# Patient Record
Sex: Male | Born: 1960 | Race: White | Hispanic: No | Marital: Single | Smoking: Current every day smoker
Health system: Southern US, Community
[De-identification: ages and names within clinical notes are randomized; demographics above are authoritative.]

## PROBLEM LIST (undated history)

## (undated) ENCOUNTER — Emergency Department (HOSPITAL_COMMUNITY): Discharge status: Absent without leave | Payer: BLUE CROSS/BLUE SHIELD

## (undated) DIAGNOSIS — M1990 Unspecified osteoarthritis, unspecified site: Secondary | ICD-10-CM

## (undated) DIAGNOSIS — S42309A Unspecified fracture of shaft of humerus, unspecified arm, initial encounter for closed fracture: Secondary | ICD-10-CM

## (undated) DIAGNOSIS — F32A Depression, unspecified: Secondary | ICD-10-CM

## (undated) DIAGNOSIS — R519 Headache, unspecified: Secondary | ICD-10-CM

## (undated) DIAGNOSIS — F909 Attention-deficit hyperactivity disorder, unspecified type: Secondary | ICD-10-CM

## (undated) DIAGNOSIS — K4090 Unilateral inguinal hernia, without obstruction or gangrene, not specified as recurrent: Secondary | ICD-10-CM

## (undated) DIAGNOSIS — E7800 Pure hypercholesterolemia, unspecified: Secondary | ICD-10-CM

## (undated) DIAGNOSIS — I10 Essential (primary) hypertension: Secondary | ICD-10-CM

## (undated) DIAGNOSIS — G709 Myoneural disorder, unspecified: Secondary | ICD-10-CM

## (undated) HISTORY — PX: HERNIA REPAIR: SHX51

---

## 2001-04-09 ENCOUNTER — Emergency Department (HOSPITAL_COMMUNITY): Admission: EM | Admit: 2001-04-09 | Discharge: 2001-04-09 | Payer: Self-pay | Admitting: Family Medicine

## 2003-10-14 ENCOUNTER — Emergency Department (HOSPITAL_COMMUNITY): Admission: EM | Admit: 2003-10-14 | Discharge: 2003-10-14 | Payer: Self-pay | Admitting: Emergency Medicine

## 2003-10-18 ENCOUNTER — Ambulatory Visit (HOSPITAL_BASED_OUTPATIENT_CLINIC_OR_DEPARTMENT_OTHER): Admission: RE | Admit: 2003-10-18 | Discharge: 2003-10-18 | Payer: Self-pay | Admitting: Orthopedic Surgery

## 2003-10-18 ENCOUNTER — Ambulatory Visit (HOSPITAL_COMMUNITY): Admission: RE | Admit: 2003-10-18 | Discharge: 2003-10-18 | Payer: Self-pay | Admitting: Orthopedic Surgery

## 2006-03-16 ENCOUNTER — Emergency Department: Payer: Self-pay | Admitting: Emergency Medicine

## 2006-11-08 ENCOUNTER — Emergency Department: Payer: Self-pay | Admitting: Emergency Medicine

## 2006-11-10 ENCOUNTER — Emergency Department: Payer: Self-pay

## 2006-12-13 HISTORY — PX: HERNIA REPAIR: SHX51

## 2007-05-29 ENCOUNTER — Emergency Department (HOSPITAL_COMMUNITY): Admission: EM | Admit: 2007-05-29 | Discharge: 2007-05-29 | Payer: Self-pay | Admitting: Emergency Medicine

## 2007-10-16 ENCOUNTER — Emergency Department (HOSPITAL_COMMUNITY): Admission: EM | Admit: 2007-10-16 | Discharge: 2007-10-17 | Payer: Self-pay | Admitting: Emergency Medicine

## 2008-04-20 ENCOUNTER — Emergency Department (HOSPITAL_COMMUNITY): Admission: EM | Admit: 2008-04-20 | Discharge: 2008-04-20 | Payer: Self-pay | Admitting: Emergency Medicine

## 2008-04-24 ENCOUNTER — Emergency Department (HOSPITAL_COMMUNITY): Admission: EM | Admit: 2008-04-24 | Discharge: 2008-04-24 | Payer: Self-pay | Admitting: Family Medicine

## 2008-07-17 ENCOUNTER — Emergency Department (HOSPITAL_COMMUNITY): Admission: EM | Admit: 2008-07-17 | Discharge: 2008-07-17 | Payer: Self-pay | Admitting: Emergency Medicine

## 2008-07-26 ENCOUNTER — Emergency Department (HOSPITAL_COMMUNITY): Admission: EM | Admit: 2008-07-26 | Discharge: 2008-07-26 | Payer: Self-pay | Admitting: Emergency Medicine

## 2008-08-09 ENCOUNTER — Ambulatory Visit (HOSPITAL_COMMUNITY): Admission: RE | Admit: 2008-08-09 | Discharge: 2008-08-09 | Payer: Self-pay | Admitting: Surgery

## 2009-04-28 ENCOUNTER — Emergency Department (HOSPITAL_COMMUNITY): Admission: EM | Admit: 2009-04-28 | Discharge: 2009-04-28 | Payer: Self-pay | Admitting: Emergency Medicine

## 2011-04-27 NOTE — Op Note (Signed)
NAME:  Justin Landry, Justin Landry           ACCOUNT NO.:  [id number]   MEDICAL RECORD NO.:  [id number]          PATIENT TYPE:  AMB   LOCATION:  DAY                          FACILITY:  [facility]   PHYSICIAN:  Sandria Bales. Ezzard Standing, M.D.  DATE OF BIRTH:  February 21, 1961   DATE OF PROCEDURE:  08/09/2008  DATE OF DISCHARGE:                               OPERATIVE REPORT   Date of surgery ??   PREOPERATIVE DIAGNOSIS:  Right inguinal hernia.   POSTOPERATIVE DIAGNOSIS:  A large direct right inguinal hernia.   PROCEDURE:  Open right inguinal herniorrhaphy with mesh repair.   SURGEON:  Sandria Bales. Ezzard Standing, M.D.   ANESTHESIA:  General LMA with 30 mL of  0.25% Marcaine.   PROCEDURE:  Mr. Leibold is a 50 year old white male who is a patient of  Dr. Vear Clock of [facility] who has noticed a right inguinal hernia for  several months.  However, he recently had enough pain that he went to  [facility], where they had to reduce the hernia there,  then sent to our office for evaluation.   I discussed with him the options for hernia repair, both open and  laparoscopic.  I think he would be a good candidate for open inguinal  hernia repair.  I discussed the potential complications including  bleeding, infection, nerve injury, and recurrence of the hernia.   OPERATIVE NOTE:  The patient was placed in the supine position, given a  general LMA anesthesia.  His loin was prepped with Betadine solution and  sterilely draped.   A time-out was held, identifying the patient and procedure.  He was  given 1 gram of Ancef at the time of the procedure.   A right inguinal incision was made with sharp dissection and carried  down to the external oblique fascia.  The external oblique fascia was  opened, the cord structures identified.  The ilioinguinal nerve was  identified.  I tried to move this out of the way of the dissection.  I  stripped away a fairly large direct inguinal hernia sac.  The inguinal  floor defect  was noted to be 2.5-3 cm.  The sac protruded probably 12+  centimeters.  I was able to reduce the  sac back into the inguinal floor  and then imbricate the sac with a 0 chromic suture.   I then tried to dissect out along the internal ring.  I found no  evidence of an indirect inguinal hernia.  I carried out a floor repair  using a piece of Bard mesh.  The mesh was 3 x 6 inches.  It was cut down  to about 2-1/2 x 4 inches.  A keyhole was made for the internal ring.  The mesh was sewn medially to the pubic tubercle, inferiorly to the  shelving edge of the inguinal ligament, superiorly to the transversalis  fascia.  The internal ring was recreated through the keyhole and mesh  closed behind the keyhole.  I used interrupted 0 Novofil sutures to sew  the mesh in place.   At this time I irrigated the wound.  I then infiltrated the  fascia with  0.25% Marcaine in the deep fascial spaces and the inguinal floor.  I  then closed the external oblique fascia with interrupted 3-0 Vicryl  sutures.  I infiltrated the subfascial spaces with 0.25% Marcaine and  then in the more superficial I infiltrated that with 0.25% Marcaine.   The patient tolerated the procedure well.  I then closed the skin with 3-  0 Vicryl subcutaneous stitches and 5-0 Monocryl suture, painted with  tincture of benzoin, and Steri-Strips.  The patient tolerated the  procedure well, was transported to the recovery room in good condition.  The sponge and needle count were correct at the end of the case.   He will be discharged home today and will see him back in 2-3 weeks for  followup.      Sandria Bales. Ezzard Standing, M.D.  Electronically Signed     DHN/MEDQ  D:  08/09/2008  T:  08/09/2008  Job:  [id number]   cc:   Loma Sender  Fax: [phone]

## 2011-04-30 NOTE — Op Note (Signed)
NAME:  Justin Landry, Justin Landry                     ACCOUNT NO.:  [id number]   MEDICAL RECORD NO.:  [id number]                   PATIENT TYPE:  AMB   LOCATION:  [facility]                                 FACILITY:  [facility]   PHYSICIAN:  Katy Fitch. Naaman Plummer., M.D.          DATE OF BIRTH:  Jul 07, 1961   DATE OF PROCEDURE:  10/18/2003  DATE OF DISCHARGE:                                 OPERATIVE REPORT   PREOPERATIVE DIAGNOSIS:  Unstable transverse metaphyseal fracture of left  thumb distal  phalanx, status post  crushing injury.   POSTOPERATIVE DIAGNOSIS:  Unstable transverse metaphyseal fracture of left  thumb distal  phalanx, status post  crushing injury.   OPERATION:  Closed reduction and percutaneous Kirschner wire fixation left  thumb distal  phalanx fracture x2.   SURGEON:  Katy Fitch. Sypher, M.D.   ASSISTANT:  Jonni Sanger, P.A.-C.   ANESTHESIA:  General by LMA, supervising anesthesiologist Janetta Hora.  Gelene Mink, M.D.   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 He had  acute pain and  swelling. He was brought to the [facility] emergency room where  his wounds were cleaned and sutures. X-rays revealed an unstable proximal  metaphyseal fracture of his left thumb distal phalanx. A hand surgery  consult was requested.   On examination  he was noted to have an unstable fracture with apex dorsal  angulation. We recommended closed reduction and percutaneous Kirschner wire  fixation. He is brought to the operating room at this time anticipating  closed reduction and percutaneous Kirschner wire fixation of his distal  phalanx fracture.   DESCRIPTION OF PROCEDURE:  Justin Landry is brought to the operating  room and placed in the supine position on the operating table. Following  induction of general anesthesia by LMA,  the left arm was prepped with  Betadine soap and solution and sterilely draped.   When the anesthesia was satisfactory, the fracture was reduced by 3 point  manipulation. Two 0.045 inch Kirschner wires were placed obliquely to avoid  the IP joint. Anatomic reduction and good fixation was achieved. There were  no apparent complications. The wounds were dressed with Xeroflow sterile  gauze and an Alumafoam splint.   For aftercare [name] was given a prescription for Percocet 1 to  2  tablets q.4-6h. p.r.n. pain, 20 tablets without refill. He is also given  Keflex 500 mg 1 p.o. q.8h. x4 days as a prophylactic antibiotic. He will  return to our office for follow up in one week for wound check and suture  removal. He anticipates pin placement  for 3 to 4 weeks.  Katy Fitch Naaman Plummer., M.D.   RVS/MEDQ  D:  10/18/2003  T:  10/19/2003  Job:  [id number]

## 2011-05-13 ENCOUNTER — Ambulatory Visit (HOSPITAL_COMMUNITY)
Admission: RE | Admit: 2011-05-13 | Discharge: 2011-05-13 | Disposition: A | Discharge status: Home / Self Care | Payer: Self-pay | Attending: Psychiatry | Admitting: Psychiatry

## 2011-05-13 DIAGNOSIS — F4310 Post-traumatic stress disorder, unspecified: Secondary | ICD-10-CM | POA: Insufficient documentation

## 2011-07-25 ENCOUNTER — Inpatient Hospital Stay (INDEPENDENT_AMBULATORY_CARE_PROVIDER_SITE_OTHER)
Admission: RE | Admit: 2011-07-25 | Discharge: 2011-07-25 | Disposition: A | Discharge status: Home / Self Care | Payer: Self-pay | Source: Ambulatory Visit | Attending: Family Medicine | Admitting: Family Medicine

## 2011-07-25 DIAGNOSIS — R109 Unspecified abdominal pain: Secondary | ICD-10-CM

## 2011-07-25 DIAGNOSIS — I10 Essential (primary) hypertension: Secondary | ICD-10-CM

## 2011-07-28 ENCOUNTER — Inpatient Hospital Stay (INDEPENDENT_AMBULATORY_CARE_PROVIDER_SITE_OTHER)
Admission: RE | Admit: 2011-07-28 | Discharge: 2011-07-28 | Disposition: A | Discharge status: Home / Self Care | Payer: Self-pay | Source: Ambulatory Visit | Attending: Family Medicine | Admitting: Family Medicine

## 2011-07-28 DIAGNOSIS — R109 Unspecified abdominal pain: Secondary | ICD-10-CM

## 2011-09-10 LAB — URINALYSIS, ROUTINE W REFLEX MICROSCOPIC
Bilirubin Urine: NEGATIVE
Glucose, UA: NEGATIVE
Hgb urine dipstick: NEGATIVE
Ketones, ur: NEGATIVE
Nitrite: NEGATIVE
Protein, ur: NEGATIVE
Specific Gravity, Urine: 1.022
Urobilinogen, UA: 1
pH: 5.5

## 2011-09-21 LAB — POCT I-STAT CREATININE
Creatinine, Ser: 0.9
Operator id: 257131

## 2011-09-21 LAB — DIFFERENTIAL
Basophils Absolute: 0
Basophils Relative: 0
Eosinophils Absolute: 0.1
Eosinophils Relative: 1
Lymphocytes Relative: 6 — ABNORMAL LOW
Lymphs Abs: 1.3
Monocytes Absolute: 1.6 — ABNORMAL HIGH
Monocytes Relative: 7
Neutro Abs: 20.7 — ABNORMAL HIGH
Neutrophils Relative %: 87 — ABNORMAL HIGH

## 2011-09-21 LAB — I-STAT 8, (EC8 V) (CONVERTED LAB)
BUN: 16
Bicarbonate: 22.7
Chloride: 102
Glucose, Bld: 145 — ABNORMAL HIGH
HCT: 52
Hemoglobin: 17.7 — ABNORMAL HIGH
Operator id: 257131
Potassium: 4
Sodium: 133 — ABNORMAL LOW
TCO2: 24
pCO2, Ven: 32.7 — ABNORMAL LOW
pH, Ven: 7.449 — ABNORMAL HIGH

## 2011-09-21 LAB — CBC
HCT: 47.4
Hemoglobin: 16.5
MCHC: 34.7
MCV: 90.8
Platelets: 292
RBC: 5.22
RDW: 13
WBC: 23.8 — ABNORMAL HIGH

## 2011-09-21 LAB — RAPID STREP SCREEN (MED CTR MEBANE ONLY): Streptococcus, Group A Screen (Direct): POSITIVE — AB

## 2011-12-08 ENCOUNTER — Emergency Department (INDEPENDENT_AMBULATORY_CARE_PROVIDER_SITE_OTHER)
Admission: EM | Admit: 2011-12-08 | Discharge: 2011-12-08 | Disposition: A | Discharge status: Home / Self Care | Payer: Self-pay | Source: Home / Self Care

## 2011-12-08 ENCOUNTER — Encounter: Payer: Self-pay | Admitting: Emergency Medicine

## 2011-12-08 DIAGNOSIS — Z91199 Patient's noncompliance with other medical treatment and regimen due to unspecified reason: Secondary | ICD-10-CM

## 2011-12-08 DIAGNOSIS — Z9114 Patient's other noncompliance with medication regimen: Secondary | ICD-10-CM

## 2011-12-08 DIAGNOSIS — I10 Essential (primary) hypertension: Secondary | ICD-10-CM

## 2011-12-08 DIAGNOSIS — Z9119 Patient's noncompliance with other medical treatment and regimen: Secondary | ICD-10-CM

## 2011-12-08 DIAGNOSIS — J209 Acute bronchitis, unspecified: Secondary | ICD-10-CM

## 2011-12-08 DIAGNOSIS — J069 Acute upper respiratory infection, unspecified: Secondary | ICD-10-CM

## 2011-12-08 HISTORY — DX: Essential (primary) hypertension: I10

## 2011-12-08 MED ORDER — ALBUTEROL SULFATE HFA 108 (90 BASE) MCG/ACT IN AERS
2.0000 | INHALATION_SPRAY | RESPIRATORY_TRACT | Status: DC | PRN
  Administered 2011-12-08 (×2): 2 via RESPIRATORY_TRACT

## 2011-12-08 MED ORDER — ALBUTEROL SULFATE HFA 108 (90 BASE) MCG/ACT IN AERS
INHALATION_SPRAY | RESPIRATORY_TRACT | Status: AC
  Filled 2011-12-08: qty 6.7

## 2011-12-08 MED ORDER — IPRATROPIUM BROMIDE 0.02 % IN SOLN
0.5000 mg | Freq: Once | RESPIRATORY_TRACT | Status: AC
  Administered 2011-12-08: 0.5 mg via RESPIRATORY_TRACT

## 2011-12-08 MED ORDER — PREDNISONE 20 MG PO TABS: 20.0000 mg | ORAL_TABLET | Freq: Two times a day (BID) | ORAL | Status: AC

## 2011-12-08 MED ORDER — ALBUTEROL SULFATE (5 MG/ML) 0.5% IN NEBU
INHALATION_SOLUTION | RESPIRATORY_TRACT | Status: AC
  Filled 2011-12-08: qty 1

## 2011-12-08 MED ORDER — ALBUTEROL SULFATE (5 MG/ML) 0.5% IN NEBU
5.0000 mg | INHALATION_SOLUTION | Freq: Once | RESPIRATORY_TRACT | Status: AC
  Administered 2011-12-08: 5 mg via RESPIRATORY_TRACT

## 2011-12-08 NOTE — ED Provider Notes (Signed)
Medical screening examination/treatment/procedure(s) were performed by non-physician practitioner and as supervising physician I was immediately available for consultation/collaboration.  LANEY,RONNIE   Ronnie Laney, MD 12/08/11 1843 

## 2011-12-08 NOTE — ED Provider Notes (Signed)
History     CSN: [id number]  Arrival date & time 12/08/11  1024   None     Chief Complaint  Patient presents with  . Sinusitis  . URI    (Consider location/radiation/quality/duration/timing/severity/associated sxs/prior treatment) HPI Comments: Pt c/o eyes watering, nasal congestion and cough x 4 days. Cough is sometimes productive with light green sputum. He has had intermittent wheezing, but denies dyspnea.  He is a smoker. Denies hx of COPD or Asthma. He has been taking Alka Seltzer Plus Cold and Benadryl for his symptoms and is not improving. He also states he ran out of his blood pressure medication one week ago. He has not notified his PCP yet because of the holidays, assuming that it would be difficult to get through to the office. Pt is uncertain which BP medication he is taking or the strength - but may be Benazepril as it sounds familiar to him. His PCP is in [place].    Past Medical History  Diagnosis Date  . Hypertension     Past Surgical History  Procedure Date  . Hernia repair     No family history on file.  History  Substance Use Topics  . Smoking status: Current Everyday Smoker  . Smokeless tobacco: Not on file  . Alcohol Use: Yes      Review of Systems  Constitutional: Negative for fever and chills.  HENT: Positive for congestion, postnasal drip and sinus pressure. Negative for ear pain and sore throat.   Eyes: Negative for redness.  Respiratory: Positive for cough and wheezing. Negative for shortness of breath.   Cardiovascular: Negative for chest pain, palpitations and leg swelling.  Gastrointestinal: Negative for nausea and vomiting.    Allergies  Review of patient's allergies indicates no known allergies.  Home Medications   Current Outpatient Rx  Name Route Sig Dispense Refill  . PRESCRIPTION MEDICATION  Pt is taking bp medication but unsure of strength.last taken wednesday     . PREDNISONE 20 MG PO TABS Oral Take 1 tablet (20 mg  total) by mouth 2 (two) times daily. 10 tablet 0    BP 183/118  Pulse 91  Temp(Src) 97.6 F (36.4 C) (Oral)  Resp 19  SpO2 95%  Physical Exam  Nursing note and vitals reviewed. Constitutional: He appears well-developed and well-nourished. No distress.  HENT:  Head: Normocephalic and atraumatic.  Right Ear: Tympanic membrane, external ear and ear canal normal.  Left Ear: Tympanic membrane, external ear and ear canal normal.  Nose: Mucosal edema (and erythema) present. No rhinorrhea, nasal deformity or septal deviation. No epistaxis.  Mouth/Throat: Uvula is midline, oropharynx is clear and moist and mucous membranes are normal. No oropharyngeal exudate, posterior oropharyngeal edema or posterior oropharyngeal erythema.  Neck: Neck supple.  Cardiovascular: Normal rate, regular rhythm and normal heart sounds.   Pulmonary/Chest: Effort normal. No respiratory distress. He has no decreased breath sounds. He has wheezes (inspiratory) in the right upper field and the left upper field. He has no rhonchi. He has no rales.  Lymphadenopathy:    He has no cervical adenopathy.  Neurological: He is alert.  Skin: Skin is warm and dry.  Psychiatric: He has a normal mood and affect.    ED Course  Procedures (including critical care time)  Labs Reviewed - No data to display No results found.   1. Acute bronchitis   2. Acute URI   3. Hypertension   4. Noncompliance with medications       MDM  Pt reports symptomatic improvement after NMT, Lungs CTA, and pulse improved to 98% RA.  Upon review of previous visits pt has a hx of HTN medication noncomplaince . Encouraged pt to f/u with PCP for HTN mgmt.        Melody Comas, [place] 12/08/11 1438

## 2011-12-08 NOTE — ED Notes (Signed)
PT HERE WITH SINUS SX/COLD URI THAT STARTED Sunday WITH NASAL CONGESTION WITH GREENISH DRAINAGE AND NOW OCASS COUGH,NAUSEA AND SINUS PRESSURE AND H/A.BP ELEVATED ALSO.PT LAST TOOK BP MED LAST [facility]

## 2011-12-20 ENCOUNTER — Other Ambulatory Visit: Payer: Self-pay

## 2011-12-20 ENCOUNTER — Emergency Department (HOSPITAL_COMMUNITY)
Admission: EM | Admit: 2011-12-20 | Discharge: 2011-12-20 | Disposition: A | Discharge status: Home / Self Care | Payer: Self-pay | Attending: Emergency Medicine | Admitting: Emergency Medicine

## 2011-12-20 ENCOUNTER — Encounter (HOSPITAL_COMMUNITY): Payer: Self-pay

## 2011-12-20 DIAGNOSIS — N476 Balanoposthitis: Secondary | ICD-10-CM | POA: Insufficient documentation

## 2011-12-20 DIAGNOSIS — I10 Essential (primary) hypertension: Secondary | ICD-10-CM | POA: Insufficient documentation

## 2011-12-20 DIAGNOSIS — E119 Type 2 diabetes mellitus without complications: Secondary | ICD-10-CM | POA: Insufficient documentation

## 2011-12-20 DIAGNOSIS — Z79899 Other long term (current) drug therapy: Secondary | ICD-10-CM | POA: Insufficient documentation

## 2011-12-20 LAB — URINALYSIS, ROUTINE W REFLEX MICROSCOPIC
Bilirubin Urine: NEGATIVE
Glucose, UA: >1000 mg/dL — AB
Hgb urine dipstick: NEGATIVE
Ketones, ur: NEGATIVE mg/dL
Leukocytes, UA: NEGATIVE
Nitrite: NEGATIVE
Protein, ur: NEGATIVE mg/dL
Specific Gravity, Urine: 1.034 — ABNORMAL HIGH (ref 1.005–1.030)
Urobilinogen, UA: 0.2 mg/dL (ref 0.0–1.0)
pH: 6 (ref 5.0–8.0)

## 2011-12-20 LAB — CBC
HCT: 49.2 % (ref 39.0–52.0)
Hemoglobin: 18.1 g/dL — ABNORMAL HIGH (ref 13.0–17.0)
RBC: 5.7 MIL/uL (ref 4.22–5.81)
WBC: 11.7 10*3/uL — ABNORMAL HIGH (ref 4.0–10.5)

## 2011-12-20 LAB — COMPREHENSIVE METABOLIC PANEL
ALT: 58 U/L — ABNORMAL HIGH (ref 0–53)
Alkaline Phosphatase: 77 U/L (ref 39–117)
BUN: 15 mg/dL (ref 6–23)
CO2: 24 mEq/L (ref 19–32)
Chloride: 94 mEq/L — ABNORMAL LOW (ref 96–112)
GFR calc Af Amer: >90 mL/min (ref >90)
Glucose, Bld: 373 mg/dL — ABNORMAL HIGH (ref 70–99)
Potassium: 4.4 mEq/L (ref 3.5–5.1)
Total Bilirubin: 0.4 mg/dL (ref 0.3–1.2)

## 2011-12-20 LAB — GLUCOSE, CAPILLARY
Glucose-Capillary: 359 mg/dL — ABNORMAL HIGH (ref 70–99)
Glucose-Capillary: 294 mg/dL — ABNORMAL HIGH (ref 70–99)
Glucose-Capillary: 218 mg/dL — ABNORMAL HIGH (ref 70–99)

## 2011-12-20 LAB — PROTIME-INR: Prothrombin Time: 12.3 seconds (ref 11.6–15.2)

## 2011-12-20 MED ORDER — SODIUM CHLORIDE 0.9 % IV BOLUS (SEPSIS)
1000.0000 mL | Freq: Once | INTRAVENOUS | Status: AC
  Administered 2011-12-20: 1000 mL via INTRAVENOUS

## 2011-12-20 MED ORDER — METFORMIN HCL 500 MG PO TABS: 500.0000 mg | ORAL_TABLET | Freq: Two times a day (BID) | ORAL | Status: DC

## 2011-12-20 MED ORDER — OXYCODONE-ACETAMINOPHEN 5-325 MG PO TABS: 1.0000 | ORAL_TABLET | ORAL | Status: AC | PRN

## 2011-12-20 MED ORDER — CLOTRIMAZOLE 1 % EX CREA: TOPICAL_CREAM | CUTANEOUS | Status: DC

## 2011-12-20 MED ORDER — PHENAZOPYRIDINE HCL 100 MG PO TABS
100.0000 mg | ORAL_TABLET | Freq: Once | ORAL | Status: AC
  Administered 2011-12-20: 100 mg via ORAL
  Filled 2011-12-20: qty 1

## 2011-12-20 MED ORDER — INSULIN ASPART 100 UNIT/ML ~~LOC~~ SOLN
10.0000 [IU] | Freq: Once | SUBCUTANEOUS | Status: AC
  Administered 2011-12-20: 10 [IU] via INTRAVENOUS
  Filled 2011-12-20: qty 1

## 2011-12-20 MED ORDER — TRAMADOL HCL 50 MG PO TABS
50.0000 mg | ORAL_TABLET | Freq: Once | ORAL | Status: AC
  Administered 2011-12-20: 50 mg via ORAL
  Filled 2011-12-20: qty 1

## 2011-12-20 MED ORDER — TRAMADOL HCL 50 MG PO TABS: 50.0000 mg | ORAL_TABLET | Freq: Once | ORAL | Status: DC

## 2011-12-20 MED ORDER — PHENAZOPYRIDINE HCL 200 MG PO TABS: 200.0000 mg | ORAL_TABLET | Freq: Three times a day (TID) | ORAL | Status: AC

## 2011-12-20 NOTE — ED Provider Notes (Signed)
History   Pt p/w 1 week of penile irritation and pain. Mild bleeding around irritated skin. No penile d/c. No urinary obstruction though pt has difficulty retracting foreskin. No systemic symptoms.    CSN: [id number]  Arrival date & time 12/20/11  1234   First MD Initiated Contact with Patient 12/20/11 1313      Chief Complaint  Patient presents with  . Hematuria    (Consider location/radiation/quality/duration/timing/severity/associated sxs/prior treatment) HPI  Past Medical History  Diagnosis Date  . Hypertension     Past Surgical History  Procedure Date  . Hernia repair     History reviewed. No pertinent family history.  History  Substance Use Topics  . Smoking status: Current Everyday Smoker  . Smokeless tobacco: Not on file  . Alcohol Use: Yes      Review of Systems  Constitutional: Negative for fever and chills.  Respiratory: Negative for cough and shortness of breath.   Cardiovascular: Negative for chest pain.  Genitourinary: Positive for dysuria, penile swelling, genital sores and penile pain. Negative for flank pain, decreased urine volume, discharge, scrotal swelling, difficulty urinating and testicular pain.    Allergies  Oxycodone and Penicillins  Home Medications   Current Outpatient Rx  Name Route Sig Dispense Refill  . ALBUTEROL SULFATE HFA 108 (90 BASE) MCG/ACT IN AERS Inhalation Inhale 2 puffs into the lungs every 6 (six) hours as needed. For shortness of breath     . LISINOPRIL-HYDROCHLOROTHIAZIDE 20-25 MG PO TABS Oral Take 1 tablet by mouth daily.      . ADULT MULTIVITAMIN W/MINERALS CH Oral Take 1 tablet by mouth daily.      Marland Kitchen BACITRACIN-NEOMYCIN-POLYMYXIN 400-04-4999 EX OINT Topical Apply 1 application topically every 12 (twelve) hours as needed. For cuts     . FISH OIL 1000 MG PO CAPS Oral Take 2,000 mg by mouth daily.        BP 168/104  Pulse 6  Temp(Src) 98.2 F (36.8 C) (Oral)  Resp 18  SpO2 96%  Physical Exam  Nursing note and  vitals reviewed. Constitutional: He is oriented to person, place, and time. He appears well-developed and well-nourished. No distress.  HENT:  Head: Normocephalic and atraumatic.  Mouth/Throat: Oropharynx is clear and moist.  Eyes: EOM are normal. Pupils are equal, round, and reactive to light.  Neck: Normal range of motion. Neck supple.  Cardiovascular: Normal rate and regular rhythm.   Pulmonary/Chest: Effort normal and breath sounds normal. No respiratory distress. He has no wheezes. He has no rales.  Abdominal: Soft. Bowel sounds are normal.  Genitourinary:       Glans and foreskin are irritated and erythematous. Pt able only to partially retract foreskin due to pain. Normal testicular exam. No penile discharge or gross blood.   Musculoskeletal: Normal range of motion. He exhibits no edema and no tenderness.  Neurological: He is alert and oriented to person, place, and time.  Skin: Skin is warm and dry. No rash noted. No erythema.  Psychiatric: He has a normal mood and affect. His behavior is normal.    ED Course  Procedures (including critical care time)  Labs Reviewed  GLUCOSE, CAPILLARY - Abnormal; Notable for the following:    Glucose-Capillary 359 (*)    All other components within normal limits  CBC - Abnormal; Notable for the following:    WBC 11.7 (*)    Hemoglobin 18.1 (*)    MCHC 36.8 (*)    All other components within normal limits  COMPREHENSIVE METABOLIC  PANEL - Abnormal; Notable for the following:    Sodium 130 (*)    Chloride 94 (*)    Glucose, Bld 373 (*)    Calcium 11.8 (*)    ALT 58 (*)    All other components within normal limits  PROTIME-INR  APTT  GC/CHLAMYDIA PROBE AMP, URINE  URINALYSIS, ROUTINE W REFLEX MICROSCOPIC  POCT CBG MONITORING   No results found.   No diagnosis found.  Date: 12/20/2011  Rate: 96  Rhythm: normal sinus rhythm  QRS Axis: normal  Intervals: PR prolonged  ST/T Wave abnormalities: normal  Conduction  Disutrbances:first-degree A-V block   Narrative Interpretation:   Old EKG Reviewed: unchanged     MDM  Pt clinically has balanoposthitis. Will check glucose to r/o DM.  Pt informed of lab findings and need to f/u with PMD. Voices understanding and states he has an appointment on Thurs.        Loren Racer, MD 12/21/11 [phone]

## 2011-12-20 NOTE — ED Notes (Signed)
2nd liter of NS running at 999/hr.

## 2011-12-20 NOTE — ED Notes (Signed)
Pt has been treated for the flu and URI, now he complains of hematuria, he states that he can urinate large amounts but his penis is red and it burns when he urinates, he also states that hes very nauseated

## 2011-12-20 NOTE — ED Notes (Signed)
CBG registered  359 on ED Glucometer

## 2011-12-21 LAB — GC/CHLAMYDIA PROBE AMP, URINE
Chlamydia, Swab/Urine, PCR: NEGATIVE
GC Probe Amp, Urine: NEGATIVE

## 2012-02-28 ENCOUNTER — Encounter (HOSPITAL_COMMUNITY): Payer: Self-pay

## 2012-02-28 ENCOUNTER — Emergency Department (HOSPITAL_COMMUNITY)
Admission: EM | Admit: 2012-02-28 | Discharge: 2012-02-28 | Disposition: A | Discharge status: Home / Self Care | Payer: Self-pay | Attending: Emergency Medicine | Admitting: Emergency Medicine

## 2012-02-28 DIAGNOSIS — Z9889 Other specified postprocedural states: Secondary | ICD-10-CM | POA: Insufficient documentation

## 2012-02-28 DIAGNOSIS — I10 Essential (primary) hypertension: Secondary | ICD-10-CM | POA: Insufficient documentation

## 2012-02-28 DIAGNOSIS — E119 Type 2 diabetes mellitus without complications: Secondary | ICD-10-CM | POA: Insufficient documentation

## 2012-02-28 DIAGNOSIS — F17200 Nicotine dependence, unspecified, uncomplicated: Secondary | ICD-10-CM | POA: Insufficient documentation

## 2012-02-28 DIAGNOSIS — R1031 Right lower quadrant pain: Secondary | ICD-10-CM

## 2012-02-28 DIAGNOSIS — R109 Unspecified abdominal pain: Secondary | ICD-10-CM | POA: Insufficient documentation

## 2012-02-28 MED ORDER — OXYCODONE-ACETAMINOPHEN 5-325 MG PO TABS: 1.0000 | ORAL_TABLET | Freq: Four times a day (QID) | ORAL | Status: AC | PRN

## 2012-02-28 MED ORDER — OXYCODONE-ACETAMINOPHEN 5-325 MG PO TABS
1.0000 | ORAL_TABLET | Freq: Once | ORAL | Status: AC
  Administered 2012-02-28: 1 via ORAL
  Filled 2012-02-28: qty 1

## 2012-02-28 NOTE — Discharge Instructions (Signed)
Please followup with central [place] surgery for further evaluation of your pain.  Take pain medication with food as needed. Do not drive or operate heavy machinery at least 4-6 hours after taking pain medication. Return to ED if he noticed increased swelling, inability to reduce the swelling, having nausea or vomiting, or he has any other concerns. The blood pressure was elevated today. Please follow up with the doctor for a recheck.  Hernia A hernia occurs when an internal organ pushes out through a weak spot in the abdominal wall. Hernias most commonly occur in the groin and around the navel. Hernias often can be pushed back into place (reduced). Most hernias tend to get worse over time. Some abdominal hernias can get stuck in the opening (irreducible or incarcerated hernia) and cannot be reduced. An irreducible abdominal hernia which is tightly squeezed into the opening is at risk for impaired blood supply (strangulated hernia). A strangulated hernia is a medical emergency. Because of the risk for an irreducible or strangulated hernia, surgery may be recommended to repair a hernia. CAUSES   Heavy lifting.   Prolonged coughing.   Straining to have a bowel movement.   A cut (incision) made during an abdominal surgery.  HOME CARE INSTRUCTIONS   Bed rest is not required. You may continue your normal activities.   Avoid lifting more than 10 pounds (4.5 kg) or straining.   Cough gently. If you are a smoker it is best to stop. Even the best hernia repair can break down with the continual strain of coughing. Even if you do not have your hernia repaired, a cough will continue to aggravate the problem.   Do not wear anything tight over your hernia. Do not try to keep it in with an outside bandage or truss. These can damage abdominal contents if they are trapped within the hernia sac.   Eat a normal diet.   Avoid constipation. Straining over long periods of time will increase hernia size and  encourage breakdown of repairs. If you cannot do this with diet alone, stool softeners may be used.  SEEK IMMEDIATE MEDICAL CARE IF:   You have a fever.   You develop increasing abdominal pain.   You feel nauseous or vomit.   Your hernia is stuck outside the abdomen, looks discolored, feels hard, or is tender.   You have any changes in your bowel habits or in the hernia that are unusual for you.   You have increased pain or swelling around the hernia.   You cannot push the hernia back in place by applying gentle pressure while lying down.  MAKE SURE YOU:   Understand these instructions.   Will watch your condition.   Will get help right away if you are not doing well or get worse.  Document Released: 11/29/2005 Document Revised: 11/18/2011 Document Reviewed: 07/18/2008 [facility] [place] [facility] Patient Information 2012 [place], [place].

## 2012-02-28 NOTE — ED Provider Notes (Signed)
History     CSN: [id number]  Arrival date & time 02/28/12  1057   First MD Initiated Contact with Patient 02/28/12 1147      Chief Complaint  Patient presents with  . Groin Pain    (Consider location/radiation/quality/duration/timing/severity/associated sxs/prior treatment) HPI  51 year old male with history of right inguinal hernia repair 3 years ago presents complaining of right groin pain. Patient states for the past week has been doing a lot of heavy lifting and now has developed pain and burning to the hernia repair site. He states pain is only happen after heavy lifting. Pain has been waxing and waning for more than a week. He denies any obvious swelling. He denies nausea, vomiting, diarrhea, fever. States pain is usually improves after rest. He denies any recent trauma. He denies testicular pain, dysuria, or discharge. He denies any other symptoms.  Past Medical History  Diagnosis Date  . Hypertension   . Diabetes mellitus     Past Surgical History  Procedure Date  . Hernia repair     No family history on file.  History  Substance Use Topics  . Smoking status: Current Everyday Smoker  . Smokeless tobacco: Not on file  . Alcohol Use: Yes      Review of Systems  All other systems reviewed and are negative.    Allergies  Oxycodone and Penicillins  Home Medications   Current Outpatient Rx  Name Route Sig Dispense Refill  . ALBUTEROL SULFATE HFA 108 (90 BASE) MCG/ACT IN AERS Inhalation Inhale 2 puffs into the lungs every 6 (six) hours as needed. For shortness of breath     . CLOTRIMAZOLE 1 % EX CREA  Apply to affected area 2 times daily 15 g 0  . LISINOPRIL-HYDROCHLOROTHIAZIDE 20-25 MG PO TABS Oral Take 1 tablet by mouth daily.      Marland Kitchen METFORMIN HCL 500 MG PO TABS Oral Take 1 tablet (500 mg total) by mouth 2 (two) times daily with a meal. 30 tablet 0    Dispense as written.  . ADULT MULTIVITAMIN W/MINERALS CH Oral Take 1 tablet by mouth daily.      Marland Kitchen  BACITRACIN-NEOMYCIN-POLYMYXIN 400-04-4999 EX OINT Topical Apply 1 application topically every 12 (twelve) hours as needed. For cuts     . FISH OIL 1000 MG PO CAPS Oral Take 2,000 mg by mouth daily.        BP 203/121  Pulse 95  Temp(Src) 98.7 F (37.1 C) (Oral)  Resp 18  SpO2 97%  Physical Exam  Nursing note and vitals reviewed. Constitutional: He appears well-nourished.  HENT:  Head: Atraumatic.  Eyes: Conjunctivae are normal.  Neck: Neck supple.  Abdominal: Soft. Bowel sounds are normal. There is no tenderness. There is no guarding. Hernia confirmed negative in the right inguinal area and confirmed negative in the left inguinal area.  Genitourinary: Testes normal. Right testis shows no swelling and no tenderness. Left testis shows no swelling and no tenderness. Uncircumcised.       Tenderness noted to right groin near prior surgical site without obvious swelling or overlying skin changes  Lymphadenopathy:       Right: No inguinal adenopathy present.       Left: No inguinal adenopathy present.    ED Course  Procedures (including critical care time)  Labs Reviewed - No data to display No results found.   No diagnosis found.    MDM  Increased pain and right hernia repair site. No evidence of strangulation or incarceration.  He has a history of hypertension. His blood pressure is elevated, likely secondary to pain. Improved blood pressures on repeat pressure check. He denies headache, chest pain, shortness of breath, numbness or weakness.  Advised patient to follow with his primary care Dr. for further management of his hypertension.I will prescribe pain medication and followup instruction with central [place] surgery for further evaluation. Red flags finding discussed with strict f/u instruction.  Note given for work with recommendation to avoid heavy lifting.        Fayrene Helper, PA-C 02/28/12 1223

## 2012-02-28 NOTE — ED Notes (Signed)
Patient reports that he had right inguinal hernia repaired 3 years ago. This past week did a lot of lifting and has developed pain and burning to the hernia repair site. No out pouching

## 2012-03-01 NOTE — ED Provider Notes (Signed)
Medical screening examination/treatment/procedure(s) were performed by non-physician practitioner and as supervising physician I was immediately available for consultation/collaboration.  Toy Baker, MD 03/01/12 [phone]

## 2012-06-02 ENCOUNTER — Encounter (HOSPITAL_COMMUNITY): Payer: Self-pay | Admitting: Emergency Medicine

## 2012-06-02 ENCOUNTER — Emergency Department (HOSPITAL_COMMUNITY)
Admission: EM | Admit: 2012-06-02 | Discharge: 2012-06-02 | Disposition: A | Discharge status: Home / Self Care | Payer: Self-pay | Attending: Emergency Medicine | Admitting: Emergency Medicine

## 2012-06-02 DIAGNOSIS — R109 Unspecified abdominal pain: Secondary | ICD-10-CM | POA: Insufficient documentation

## 2012-06-02 DIAGNOSIS — Z79899 Other long term (current) drug therapy: Secondary | ICD-10-CM | POA: Insufficient documentation

## 2012-06-02 DIAGNOSIS — E119 Type 2 diabetes mellitus without complications: Secondary | ICD-10-CM | POA: Insufficient documentation

## 2012-06-02 DIAGNOSIS — I10 Essential (primary) hypertension: Secondary | ICD-10-CM | POA: Insufficient documentation

## 2012-06-02 DIAGNOSIS — R1030 Lower abdominal pain, unspecified: Secondary | ICD-10-CM

## 2012-06-02 MED ORDER — HYDROCODONE-ACETAMINOPHEN 5-325 MG PO TABS
2.0000 | ORAL_TABLET | Freq: Once | ORAL | Status: AC
  Administered 2012-06-02: 2 via ORAL
  Filled 2012-06-02: qty 2

## 2012-06-02 MED ORDER — HYDROCODONE-ACETAMINOPHEN 5-500 MG PO TABS: 1.0000 | ORAL_TABLET | Freq: Four times a day (QID) | ORAL | Status: AC | PRN

## 2012-06-02 NOTE — ED Notes (Signed)
Pt reports increased pain in r/groin over last 18 hrs. Pt stated that he was doing heavy lifting yesterday. Hx mesh for hernia

## 2012-06-02 NOTE — ED Provider Notes (Signed)
History     CSN: [id number]  Arrival date & time 06/02/12  1013   First MD Initiated Contact with Patient 06/02/12 1039      Chief Complaint  Patient presents with  . Groin Pain    c/o r groinn. Hx of hernia with mesh    (Consider location/radiation/quality/duration/timing/severity/associated sxs/prior treatment) HPI Comments: Patient with history of inguinal hernia repair three years ago.  Was lifting a manhole cover yesterday and developed pain in the area of the repair.  There is no vomiting or fever.  Bowels working normally. This has happened to him before when he has lifted, but improves with pain medicine and time.  Patient is a 51 y.o. male presenting with groin pain. The history is provided by the patient.  Groin Pain This is a recurrent problem. The current episode started yesterday. The problem occurs constantly. The problem has not changed since onset.Pertinent negatives include no abdominal pain. Exacerbated by: lifting. Nothing relieves the symptoms. He has tried nothing for the symptoms.    Past Medical History  Diagnosis Date  . Hypertension   . Diabetes mellitus     Past Surgical History  Procedure Date  . Hernia repair     Family History  Problem Relation Age of Onset  . Diabetes Mother   . Hypertension Mother   . Diabetes Father   . Hypertension Father     History  Substance Use Topics  . Smoking status: Current Everyday Smoker    Types: Cigarettes  . Smokeless tobacco: Not on file  . Alcohol Use: Yes      Review of Systems  Gastrointestinal: Negative for abdominal pain.  All other systems reviewed and are negative.    Allergies  Oxycodone and Penicillins  Home Medications   Current Outpatient Rx  Name Route Sig Dispense Refill  . ALBUTEROL SULFATE HFA 108 (90 BASE) MCG/ACT IN AERS Inhalation Inhale 2 puffs into the lungs every 6 (six) hours as needed. For shortness of breath     . LISINOPRIL-HYDROCHLOROTHIAZIDE 20-25 MG PO TABS  Oral Take 1 tablet by mouth daily.      Marland Kitchen METFORMIN HCL 500 MG PO TABS Oral Take 500 mg by mouth 2 (two) times daily with a meal.    . ADULT MULTIVITAMIN W/MINERALS CH Oral Take 1 tablet by mouth daily.      Marland Kitchen FISH OIL 1000 MG PO CAPS Oral Take 2,000 mg by mouth daily.        BP 177/112  Pulse 81  Temp 97.8 F (36.6 C) (Oral)  Resp 18  SpO2 100%  Physical Exam  Nursing note and vitals reviewed. Constitutional: He is oriented to person, place, and time. He appears well-developed and well-nourished. No distress.  HENT:  Head: Normocephalic and atraumatic.  Neck: Normal range of motion. Neck supple.  Cardiovascular: Normal rate and regular rhythm.   No murmur heard. Pulmonary/Chest: Effort normal and breath sounds normal.  Abdominal:       There right groin is ttp in the inguinal region.  There is no palpable hernia or mass.  There is no protrusion with valsalva.    Musculoskeletal: Normal range of motion.  Neurological: He is alert and oriented to person, place, and time.  Skin: Skin is warm and dry. He is not diaphoretic.    ED Course  Procedures (including critical care time)  Labs Reviewed - No data to display No results found.   No diagnosis found.    MDM  Will treat  with pain meds, rest, time.  Return or follow up with surgery prn.        Geoffery Lyons, MD 06/02/12 1053

## 2012-06-02 NOTE — Discharge Instructions (Signed)

## 2012-06-20 ENCOUNTER — Emergency Department (HOSPITAL_COMMUNITY)
Admission: EM | Admit: 2012-06-20 | Discharge: 2012-06-20 | Disposition: A | Discharge status: Home / Self Care | Payer: Self-pay | Attending: Emergency Medicine | Admitting: Emergency Medicine

## 2012-06-20 ENCOUNTER — Encounter (HOSPITAL_COMMUNITY): Payer: Self-pay | Admitting: Emergency Medicine

## 2012-06-20 DIAGNOSIS — Z79899 Other long term (current) drug therapy: Secondary | ICD-10-CM | POA: Insufficient documentation

## 2012-06-20 DIAGNOSIS — R739 Hyperglycemia, unspecified: Secondary | ICD-10-CM

## 2012-06-20 DIAGNOSIS — R51 Headache: Secondary | ICD-10-CM | POA: Insufficient documentation

## 2012-06-20 DIAGNOSIS — R42 Dizziness and giddiness: Secondary | ICD-10-CM | POA: Insufficient documentation

## 2012-06-20 DIAGNOSIS — I10 Essential (primary) hypertension: Secondary | ICD-10-CM | POA: Insufficient documentation

## 2012-06-20 DIAGNOSIS — E119 Type 2 diabetes mellitus without complications: Secondary | ICD-10-CM | POA: Insufficient documentation

## 2012-06-20 DIAGNOSIS — D72829 Elevated white blood cell count, unspecified: Secondary | ICD-10-CM | POA: Insufficient documentation

## 2012-06-20 DIAGNOSIS — R109 Unspecified abdominal pain: Secondary | ICD-10-CM | POA: Insufficient documentation

## 2012-06-20 DIAGNOSIS — R110 Nausea: Secondary | ICD-10-CM | POA: Insufficient documentation

## 2012-06-20 LAB — URINE MICROSCOPIC-ADD ON

## 2012-06-20 LAB — URINALYSIS, ROUTINE W REFLEX MICROSCOPIC
Bilirubin Urine: NEGATIVE
Ketones, ur: NEGATIVE mg/dL
Leukocytes, UA: NEGATIVE
Nitrite: NEGATIVE
Protein, ur: 100 mg/dL — AB
Urobilinogen, UA: 0.2 mg/dL (ref 0.0–1.0)
pH: 6.5 (ref 5.0–8.0)

## 2012-06-20 LAB — LIPID PANEL
HDL: 42 mg/dL (ref >39)
LDL Cholesterol: UNDETERMINED mg/dL (ref 0–99)
Total CHOL/HDL Ratio: 6.1 RATIO
Triglycerides: 621 mg/dL — ABNORMAL HIGH (ref <150)
VLDL: UNDETERMINED mg/dL (ref 0–40)

## 2012-06-20 LAB — COMPREHENSIVE METABOLIC PANEL
ALT: 61 U/L — ABNORMAL HIGH (ref 0–53)
AST: 26 U/L (ref 0–37)
Albumin: 4.3 g/dL (ref 3.5–5.2)
CO2: 23 mEq/L (ref 19–32)
Calcium: 10.8 mg/dL — ABNORMAL HIGH (ref 8.4–10.5)
Creatinine, Ser: 0.67 mg/dL (ref 0.50–1.35)
GFR calc non Af Amer: >90 mL/min (ref >90)
Sodium: 129 mEq/L — ABNORMAL LOW (ref 135–145)
Total Protein: 8.2 g/dL (ref 6.0–8.3)

## 2012-06-20 LAB — CBC WITH DIFFERENTIAL/PLATELET
Basophils Absolute: 0.1 10*3/uL (ref 0.0–0.1)
Basophils Relative: 0 % (ref 0–1)
Eosinophils Relative: 1 % (ref 0–5)
Lymphocytes Relative: 13 % (ref 12–46)
MCHC: 36.4 g/dL — ABNORMAL HIGH (ref 30.0–36.0)
MCV: 90.1 fL (ref 78.0–100.0)
Monocytes Absolute: 1.3 10*3/uL — ABNORMAL HIGH (ref 0.1–1.0)
Platelets: 295 10*3/uL (ref 150–400)
RDW: 12.9 % (ref 11.5–15.5)
WBC: 19.6 10*3/uL — ABNORMAL HIGH (ref 4.0–10.5)

## 2012-06-20 LAB — GLUCOSE, CAPILLARY
Glucose-Capillary: 335 mg/dL — ABNORMAL HIGH (ref 70–99)
Glucose-Capillary: 222 mg/dL — ABNORMAL HIGH (ref 70–99)

## 2012-06-20 MED ORDER — ONDANSETRON HCL 4 MG/2ML IJ SOLN
4.0000 mg | Freq: Once | INTRAMUSCULAR | Status: AC
  Administered 2012-06-20: 4 mg via INTRAVENOUS
  Filled 2012-06-20: qty 2

## 2012-06-20 MED ORDER — SODIUM CHLORIDE 0.9 % IV BOLUS (SEPSIS)
1000.0000 mL | Freq: Once | INTRAVENOUS | Status: AC
  Administered 2012-06-20: 1000 mL via INTRAVENOUS

## 2012-06-20 MED ORDER — PROMETHAZINE HCL 25 MG PO TABS: 25.0000 mg | ORAL_TABLET | Freq: Four times a day (QID) | ORAL | Status: DC | PRN

## 2012-06-20 NOTE — ED Provider Notes (Signed)
History     CSN: [id number]  Arrival date & time 06/20/12  1022   First MD Initiated Contact with Patient 06/20/12 1053      Chief Complaint  Patient presents with  . Hypertension    (Consider location/radiation/quality/duration/timing/severity/associated sxs/prior treatment) HPI  51 year old male with history of hypertension history of diabetes presents with multiple complaints. Patient states for the past 2-3 days he has not helped. He endorsed increasing pain to his right groin where he had a hernia surgery performed several years past. Denies any increase in swelling or redness but noticed that it is uncomfortable at all time. Describe pain as a sharp and aching sensation, is nonradiating it is associated with nausea and dry heaves. Patient also endorsed a constant headache. Describes a throbbing sensation, worsening throughout the day. Patient also feels dizzy. He reports he recently performed a colon cleanser with MOM, and had several loose stools. Today, he went to check his BP and it was 170s.  Sts he has been taking lisinopril daily and also metformin, but his blood sugar has been high. Sts he's not compliant with his diabetic diet.  He does endorse to heavy lifting at his job but unable to switch job.  Denies fever, chills, cp, sob, back pain, or urinary sxs.    Past Medical History  Diagnosis Date  . Hypertension   . Diabetes mellitus     Past Surgical History  Procedure Date  . Hernia repair     Family History  Problem Relation Age of Onset  . Diabetes Mother   . Hypertension Mother   . Diabetes Father   . Hypertension Father     History  Substance Use Topics  . Smoking status: Current Everyday Smoker    Types: Cigarettes  . Smokeless tobacco: Not on file  . Alcohol Use: Yes      Review of Systems  All other systems reviewed and are negative.    Allergies  Oxycodone and Penicillins  Home Medications   Current Outpatient Rx  Name Route Sig Dispense  Refill  . ALBUTEROL SULFATE HFA 108 (90 BASE) MCG/ACT IN AERS Inhalation Inhale 2 puffs into the lungs every 6 (six) hours as needed. For shortness of breath     . LISINOPRIL-HYDROCHLOROTHIAZIDE 20-25 MG PO TABS Oral Take 1 tablet by mouth daily.      Marland Kitchen METFORMIN HCL 500 MG PO TABS Oral Take 500 mg by mouth 2 (two) times daily with a meal.    . ADULT MULTIVITAMIN W/MINERALS CH Oral Take 1 tablet by mouth daily.      Marland Kitchen FISH OIL 1000 MG PO CAPS Oral Take 2,000 mg by mouth daily.      Marland Kitchen VITAMIN C 500 MG PO TABS Oral Take 500 mg by mouth daily.      BP 158/101  Pulse 105  Temp 98.4 F (36.9 C) (Oral)  Resp 22  SpO2 96%  Physical Exam  Nursing note and vitals reviewed. Constitutional: He appears well-developed and well-nourished. No distress.       Awake, alert, nontoxic appearance  HENT:  Head: Normocephalic and atraumatic.  Eyes: Conjunctivae are normal. Right eye exhibits no discharge. Left eye exhibits no discharge.  Neck: Normal range of motion. Neck supple.  Cardiovascular: Normal rate and regular rhythm.   Pulmonary/Chest: Effort normal. No respiratory distress. He exhibits no tenderness.  Abdominal: Soft. There is no tenderness. There is no rebound.    Musculoskeletal: He exhibits no edema and no tenderness.  ROM appears intact, no obvious focal weakness  Neurological: He is alert.  Skin: Skin is warm and dry. No rash noted.  Psychiatric: He has a normal mood and affect.    ED Course  Procedures (including critical care time)  Labs Reviewed - No data to display No results found.  Results for orders placed during the hospital encounter of 06/20/12  GLUCOSE, CAPILLARY      Component Value Range   Glucose-Capillary 335 (*) 70 - 99 mg/dL  CBC WITH DIFFERENTIAL      Component Value Range   WBC 19.6 (*) 4.0 - 10.5 K/uL   RBC 5.58  4.22 - 5.81 MIL/uL   Hemoglobin 18.3 (*) 13.0 - 17.0 g/dL   HCT 16.1  09.6 - 04.5 %   MCV 90.1  78.0 - 100.0 fL   MCH 32.8  26.0 -  34.0 pg   MCHC 36.4 (*) 30.0 - 36.0 g/dL   RDW 40.9  81.1 - 91.4 %   Platelets 295  150 - 400 K/uL   Neutrophils Relative 79 (*) 43 - 77 %   Neutro Abs 15.5 (*) 1.7 - 7.7 K/uL   Lymphocytes Relative 13  12 - 46 %   Lymphs Abs 2.6  0.7 - 4.0 K/uL   Monocytes Relative 7  3 - 12 %   Monocytes Absolute 1.3 (*) 0.1 - 1.0 K/uL   Eosinophils Relative 1  0 - 5 %   Eosinophils Absolute 0.1  0.0 - 0.7 K/uL   Basophils Relative 0  0 - 1 %   Basophils Absolute 0.1  0.0 - 0.1 K/uL  COMPREHENSIVE METABOLIC PANEL      Component Value Range   Sodium 129 (*) 135 - 145 mEq/L   Potassium 4.1  3.5 - 5.1 mEq/L   Chloride 90 (*) 96 - 112 mEq/L   CO2 23  19 - 32 mEq/L   Glucose, Bld 303 (*) 70 - 99 mg/dL   BUN 12  6 - 23 mg/dL   Creatinine, Ser 7.82  0.50 - 1.35 mg/dL   Calcium 95.6 (*) 8.4 - 10.5 mg/dL   Total Protein 8.2  6.0 - 8.3 g/dL   Albumin 4.3  3.5 - 5.2 g/dL   AST 26  0 - 37 U/L   ALT 61 (*) 0 - 53 U/L   Alkaline Phosphatase 64  39 - 117 U/L   Total Bilirubin 0.6  0.3 - 1.2 mg/dL   GFR calc non Af Amer >90  >90 mL/min   GFR calc Af Amer >90  >90 mL/min  URINALYSIS, ROUTINE W REFLEX MICROSCOPIC      Component Value Range   Color, Urine YELLOW  YELLOW   APPearance CLEAR  CLEAR   Specific Gravity, Urine 1.031 (*) 1.005 - 1.030   pH 6.5  5.0 - 8.0   Glucose, UA >1000 (*) NEGATIVE mg/dL   Hgb urine dipstick NEGATIVE  NEGATIVE   Bilirubin Urine NEGATIVE  NEGATIVE   Ketones, ur NEGATIVE  NEGATIVE mg/dL   Protein, ur 213 (*) NEGATIVE mg/dL   Urobilinogen, UA 0.2  0.0 - 1.0 mg/dL   Nitrite NEGATIVE  NEGATIVE   Leukocytes, UA NEGATIVE  NEGATIVE  URINE MICROSCOPIC-ADD ON      Component Value Range   Squamous Epithelial / LPF RARE  RARE   WBC, UA 0-2  <3 WBC/hpf   RBC / HPF 0-2  <3 RBC/hpf   Bacteria, UA RARE  RARE   No results found.  1. Hyperglycemia 2. Leukocytosis   MDM  Patient with uncontrolled diabetes, on metformin. His blood sugar today is 335.    12:21 PM Patient  has an anion gap of 16.  He has an elevated resp rate of 22 but no Kussmal breathing. Corrected Na+ is 134. I discussed with my attending, who recommend continuing IVF.     1:29 PM My attending has evaluated the patient and sts pt safe to be discharge.  Time were spent counseling on appropriate diabetic diet.  Pt is to f/u with PCP for further management of his hyperglycemia.  Pt otherwise able to tolerates PO.  In NAD.  CBG improves to 222.     Fayrene Helper, PA-C 06/20/12 1413

## 2012-06-20 NOTE — ED Notes (Signed)
cbg 222 

## 2012-06-20 NOTE — ED Provider Notes (Signed)
Medical screening examination/treatment/procedure(s) were conducted as a shared visit with non-physician practitioner(s) and myself.  I personally evaluated the patient during the encounter  Pt notes noncompliance with his diabetic diet. He is instructed to followup with Dr.  Toy Baker, MD 06/20/12 1316

## 2012-06-20 NOTE — ED Notes (Addendum)
Pt here for c/ HTN started having dizziness and nausea took his bp at [facility] and it was 172/112  He thought he should come to the ED took Lisinopril @ 0800 denies chest pain or sob

## 2012-06-22 NOTE — ED Provider Notes (Signed)
Medical screening examination/treatment/procedure(s) were performed by non-physician practitioner and as supervising physician I was immediately available for consultation/collaboration.  Toy Baker, MD 06/22/12 [phone]

## 2012-09-03 ENCOUNTER — Emergency Department (HOSPITAL_COMMUNITY): Payer: Self-pay

## 2012-09-03 ENCOUNTER — Emergency Department (HOSPITAL_COMMUNITY)
Admission: EM | Admit: 2012-09-03 | Discharge: 2012-09-03 | Disposition: A | Discharge status: Home / Self Care | Payer: Self-pay | Attending: Emergency Medicine | Admitting: Emergency Medicine

## 2012-09-03 ENCOUNTER — Encounter (HOSPITAL_COMMUNITY): Payer: Self-pay | Admitting: *Deleted

## 2012-09-03 DIAGNOSIS — Z79899 Other long term (current) drug therapy: Secondary | ICD-10-CM | POA: Insufficient documentation

## 2012-09-03 DIAGNOSIS — I10 Essential (primary) hypertension: Secondary | ICD-10-CM | POA: Insufficient documentation

## 2012-09-03 DIAGNOSIS — S46919A Strain of unspecified muscle, fascia and tendon at shoulder and upper arm level, unspecified arm, initial encounter: Secondary | ICD-10-CM

## 2012-09-03 DIAGNOSIS — E119 Type 2 diabetes mellitus without complications: Secondary | ICD-10-CM | POA: Insufficient documentation

## 2012-09-03 DIAGNOSIS — Y9289 Other specified places as the place of occurrence of the external cause: Secondary | ICD-10-CM | POA: Insufficient documentation

## 2012-09-03 DIAGNOSIS — X500XXA Overexertion from strenuous movement or load, initial encounter: Secondary | ICD-10-CM | POA: Insufficient documentation

## 2012-09-03 DIAGNOSIS — IMO0002 Reserved for concepts with insufficient information to code with codable children: Secondary | ICD-10-CM | POA: Insufficient documentation

## 2012-09-03 MED ORDER — OXYCODONE-ACETAMINOPHEN 5-325 MG PO TABS: 1.0000 | ORAL_TABLET | ORAL | Status: DC | PRN

## 2012-09-03 MED ORDER — ONDANSETRON 8 MG PO TBDP
8.0000 mg | ORAL_TABLET | Freq: Once | ORAL | Status: AC
  Administered 2012-09-03: 8 mg via ORAL
  Filled 2012-09-03: qty 1

## 2012-09-03 MED ORDER — HYDROMORPHONE HCL PF 2 MG/ML IJ SOLN
2.0000 mg | Freq: Once | INTRAMUSCULAR | Status: AC
  Administered 2012-09-03: 2 mg via INTRAMUSCULAR
  Filled 2012-09-03: qty 1

## 2012-09-03 NOTE — ED Notes (Signed)
Left shoulder injury, felt pop, unable to move left arm

## 2012-09-03 NOTE — ED Provider Notes (Signed)
History     CSN: [id number]  Arrival date & time 09/03/12  4098   First MD Initiated Contact with Patient 09/03/12 1950      Chief Complaint  Patient presents with  . Shoulder Injury    (Consider location/radiation/quality/duration/timing/severity/associated sxs/prior treatment) HPI Comments: Justin Landry is a 51 y.o. Male who injured the left shoulder putting a mattress on a car. He denies paresthesias, weakness or loss of strength in left hand. No other injuries. He has previously injured the shoulder. He has never had a shoulder dislocation. The pain is worse with palpation, movement. There are no palliative factors.  Patient is a 51 y.o. male presenting with shoulder injury. The history is provided by the patient.  Shoulder Injury    Past Medical History  Diagnosis Date  . Hypertension   . Diabetes mellitus     Past Surgical History  Procedure Date  . Hernia repair     Family History  Problem Relation Age of Onset  . Diabetes Mother   . Hypertension Mother   . Diabetes Father   . Hypertension Father     History  Substance Use Topics  . Smoking status: Current Every Day Smoker    Types: Cigarettes  . Smokeless tobacco: Not on file  . Alcohol Use: Yes      Review of Systems  All other systems reviewed and are negative.    Allergies  Oxycodone and Penicillins  Home Medications   Current Outpatient Rx  Name Route Sig Dispense Refill  . ALBUTEROL SULFATE HFA 108 (90 BASE) MCG/ACT IN AERS Inhalation Inhale 2 puffs into the lungs every 6 (six) hours as needed. For shortness of breath     . LISINOPRIL-HYDROCHLOROTHIAZIDE 20-25 MG PO TABS Oral Take 1 tablet by mouth daily.      Marland Kitchen METFORMIN HCL 500 MG PO TABS Oral Take 500 mg by mouth 2 (two) times daily with a meal.    . ADULT MULTIVITAMIN W/MINERALS CH Oral Take 1 tablet by mouth daily.      Marland Kitchen FISH OIL 1000 MG PO CAPS Oral Take 2,000 mg by mouth daily.      Marland Kitchen VITAMIN C 500 MG PO TABS Oral Take  500 mg by mouth daily.    . OXYCODONE-ACETAMINOPHEN 5-325 MG PO TABS Oral Take 1 tablet by mouth every 4 (four) hours as needed for pain. 20 tablet 0  . PROMETHAZINE HCL 25 MG PO TABS Oral Take 1 tablet (25 mg total) by mouth every 6 (six) hours as needed for nausea. 30 tablet 0    BP 158/115  Pulse 100  Temp 98.5 F (36.9 C) (Oral)  Resp 24  SpO2 96%  Physical Exam  Nursing note and vitals reviewed. Constitutional: He is oriented to person, place, and time. He appears well-developed and well-nourished.  HENT:  Head: Normocephalic and atraumatic.  Right Ear: External ear normal.  Left Ear: External ear normal.  Eyes: Conjunctivae normal and EOM are normal. Pupils are equal, round, and reactive to light.  Neck: Normal range of motion and phonation normal. Neck supple.  Cardiovascular: Normal rate, regular rhythm, normal heart sounds and intact distal pulses.   Pulmonary/Chest: Effort normal and breath sounds normal. He exhibits no bony tenderness.  Abdominal: Soft. Normal appearance. There is no tenderness.  Musculoskeletal: Normal range of motion. He exhibits tenderness (left anterior, moderate. No deformity).  Neurological: He is alert and oriented to person, place, and time. He has normal strength. No cranial nerve deficit  or sensory deficit. He exhibits normal muscle tone. Coordination normal.  Skin: Skin is warm, dry and intact.  Psychiatric: He has a normal mood and affect. His behavior is normal. Judgment and thought content normal.    ED Course  Procedures (including critical care time)   Emergency department treatment: IM Dilaudid.Marland Kitchen  Reevaluation. Patient is comfortable at 21:15 hours Left arm sling, ordered.   Labs Reviewed - No data to display Dg Shoulder Left  09/03/2012  *RADIOLOGY REPORT*  Clinical Data: 51 year old male status post fall with pain.  LEFT SHOULDER - 2+ VIEW  Comparison: 05/29/2007.  Findings: No glenohumeral joint dislocation.  Proximal left  humerus intact with some chronic sclerosis of the humeral head.  Left clavicle and scapula appear intact.  Visualized left ribs and lung parenchyma within normal limits.  IMPRESSION: No acute fracture or dislocation identified about the left shoulder.   Original Report Authenticated By: Ulla Potash III, M.D.      1. Shoulder strain       MDM  Shoulder strain, doubt fracture, nerve impingement, or significant rotator cuff injury.   Plan: Home Medications- Percocet; Home Treatments- sling, cryotherapy; Recommended follow up- Ortho 1 week       Flint Melter, MD 09/03/12 2120

## 2012-11-15 ENCOUNTER — Emergency Department (HOSPITAL_COMMUNITY)
Admission: EM | Admit: 2012-11-15 | Discharge: 2012-11-15 | Discharge status: Against Medical Advice | Payer: Self-pay | Attending: Emergency Medicine | Admitting: Emergency Medicine

## 2012-11-15 DIAGNOSIS — Z5320 Procedure and treatment not carried out because of patient's decision for unspecified reasons: Secondary | ICD-10-CM | POA: Insufficient documentation

## 2012-11-15 NOTE — ED Notes (Signed)
Per Daphine Deutscher NT pt left stating he could wait til tomorrow to be seen; undersigned nurse never saw pt

## 2012-11-16 ENCOUNTER — Emergency Department (HOSPITAL_COMMUNITY): Payer: Self-pay

## 2012-11-16 ENCOUNTER — Emergency Department (HOSPITAL_COMMUNITY)
Admission: EM | Admit: 2012-11-16 | Discharge: 2012-11-16 | Disposition: A | Discharge status: Home / Self Care | Payer: Self-pay | Attending: Emergency Medicine | Admitting: Emergency Medicine

## 2012-11-16 ENCOUNTER — Encounter (HOSPITAL_COMMUNITY): Payer: Self-pay | Admitting: Emergency Medicine

## 2012-11-16 DIAGNOSIS — X500XXA Overexertion from strenuous movement or load, initial encounter: Secondary | ICD-10-CM | POA: Insufficient documentation

## 2012-11-16 DIAGNOSIS — I10 Essential (primary) hypertension: Secondary | ICD-10-CM | POA: Insufficient documentation

## 2012-11-16 DIAGNOSIS — M25519 Pain in unspecified shoulder: Secondary | ICD-10-CM | POA: Insufficient documentation

## 2012-11-16 DIAGNOSIS — Z87828 Personal history of other (healed) physical injury and trauma: Secondary | ICD-10-CM | POA: Insufficient documentation

## 2012-11-16 DIAGNOSIS — E119 Type 2 diabetes mellitus without complications: Secondary | ICD-10-CM | POA: Insufficient documentation

## 2012-11-16 DIAGNOSIS — Y9289 Other specified places as the place of occurrence of the external cause: Secondary | ICD-10-CM | POA: Insufficient documentation

## 2012-11-16 DIAGNOSIS — Z79899 Other long term (current) drug therapy: Secondary | ICD-10-CM | POA: Insufficient documentation

## 2012-11-16 DIAGNOSIS — F17200 Nicotine dependence, unspecified, uncomplicated: Secondary | ICD-10-CM | POA: Insufficient documentation

## 2012-11-16 DIAGNOSIS — Y9389 Activity, other specified: Secondary | ICD-10-CM | POA: Insufficient documentation

## 2012-11-16 MED ORDER — HYDROCODONE-ACETAMINOPHEN 5-325 MG PO TABS: ORAL_TABLET | ORAL | Status: DC

## 2012-11-16 MED ORDER — HYDROCODONE-ACETAMINOPHEN 5-325 MG PO TABS: 1.0000 | ORAL_TABLET | Freq: Once | ORAL | Status: DC

## 2012-11-16 NOTE — ED Notes (Signed)
Pt came last night then returned today due to it was to long of a wait. States he has a previous rotater cuff injury and last night he was moving a matress and states that his arm went numb and he is unable to move it well. Able to move digits.

## 2012-11-16 NOTE — ED Provider Notes (Signed)
History     CSN: [id number]  Arrival date & time 11/16/12  4098   First MD Initiated Contact with Patient 11/16/12 [phone]      Chief Complaint  Patient presents with  . Shoulder Pain    (Consider location/radiation/quality/duration/timing/severity/associated sxs/prior treatment) HPI Comments: Patient presents with complaint of left shoulder pain. Patient states that he injured his rotator cuff approximately 2 months ago but did not follow up with an orthopedic doctor. Yesterday he was attempting to help with a mattress when the mattress fell twisting his left arm. He complains of anterior left shoulder pain and pain with movement of his left arm. He denies weakness, numbness in his hand or forearm. No treatments prior to arrival. Onset was acute. Course is constant. Nothing makes symptoms better.  Patient is a 51 y.o. male presenting with shoulder pain. The history is provided by the patient.  Shoulder Pain Associated symptoms include arthralgias. Pertinent negatives include no joint swelling, neck pain, numbness or weakness.    Past Medical History  Diagnosis Date  . Hypertension   . Diabetes mellitus     Past Surgical History  Procedure Date  . Hernia repair     Family History  Problem Relation Age of Onset  . Diabetes Mother   . Hypertension Mother   . Diabetes Father   . Hypertension Father     History  Substance Use Topics  . Smoking status: Current Every Day Smoker    Types: Cigarettes  . Smokeless tobacco: Not on file  . Alcohol Use: Yes      Review of Systems  Constitutional: Positive for activity change.  HENT: Negative for neck pain.   Musculoskeletal: Positive for arthralgias. Negative for back pain, joint swelling and gait problem.  Skin: Negative for wound.  Neurological: Negative for weakness and numbness.    Allergies  Oxycodone and Penicillins  Home Medications   Current Outpatient Rx  Name  Route  Sig  Dispense  Refill  . ACETAMINOPHEN  500 MG PO TABS   Oral   Take 1,000 mg by mouth every 6 (six) hours as needed. Pain         . ALBUTEROL SULFATE HFA 108 (90 BASE) MCG/ACT IN AERS   Inhalation   Inhale 2 puffs into the lungs every 6 (six) hours as needed. For shortness of breath          . LISINOPRIL-HYDROCHLOROTHIAZIDE 20-25 MG PO TABS   Oral   Take 1 tablet by mouth daily.           Marland Kitchen METFORMIN HCL 500 MG PO TABS   Oral   Take 500 mg by mouth 2 (two) times daily with a meal.         . ADULT MULTIVITAMIN W/MINERALS CH   Oral   Take 1 tablet by mouth daily.           Marland Kitchen FISH OIL 1000 MG PO CAPS   Oral   Take 2,000 mg by mouth daily.           Marland Kitchen VITAMIN C 500 MG PO TABS   Oral   Take 500 mg by mouth daily.           BP 147/97  Pulse 101  Temp 98.4 F (36.9 C)  Resp 16  SpO2 98%  Physical Exam  Nursing note and vitals reviewed. Constitutional: He appears well-developed and well-nourished.  HENT:  Head: Normocephalic and atraumatic.  Eyes: Conjunctivae normal are normal.  Neck:  Normal range of motion. Neck supple.  Cardiovascular: Normal pulses.   Musculoskeletal: He exhibits tenderness. He exhibits no edema.       Left shoulder: He exhibits decreased range of motion, tenderness, bony tenderness and pain. He exhibits no swelling, no effusion, no deformity and normal pulse.       Left elbow: Normal.       Left wrist: Normal.       Cervical back: Normal.       Arms: Neurological: He is alert. No sensory deficit.       Motor, sensation, and vascular distal to the injury is fully intact.   Skin: Skin is warm and dry.  Psychiatric: He has a normal mood and affect.    ED Course  Procedures (including critical care time)  Labs Reviewed - No data to display Dg Shoulder Left  11/16/2012  *RADIOLOGY REPORT*  Clinical Data: Increasing left shoulder pain and limited range of motion.  LEFT SHOULDER - 2+ VIEW  Comparison: 09/03/2012  Findings: There are minimal degenerative changes of the  acromioclavicular joint and slight to degenerative changes of the greater tuberosity.  No fracture, dislocation, or soft tissue calcification.  Glenohumeral joint is normal.  IMPRESSION: Minimal degenerative changes.  No acute abnormalities.   Original Report Authenticated By: Francene Boyers, M.D.      1. Shoulder pain     9:47 AM Patient seen and examined. Pt informed of x-rays. Informed of need to followup with orthopedic physician.  Vital signs reviewed and are as follows: Filed Vitals:   11/16/12 0835  BP: 147/97  Pulse: 101  Temp: 98.4 F (36.9 C)  Resp: 16    9:47 AM Patient counseled on use of narcotic pain medications. Counseled not to combine these medications with others containing tylenol. Urged not to drink alcohol, drive, or perform any other activities that requires focus while taking these medications. The patient verbalizes understanding and agrees with the plan.  Patient counseled on use of narcotic pain medications. Counseled not to combine these medications with others containing tylenol. Urged not to drink alcohol, drive, or perform any other activities that requires focus while taking these medications. The patient verbalizes understanding and agrees with the plan.  MDM  Shoulder pain after injury. X-ray neg. Extremity is neurovascularly intact. Suspect musculoskeletal injury. Ortho f/u indicated and referral given. Patient has a sling.         Renne Crigler, [place] 11/16/12 1121

## 2012-11-18 NOTE — ED Provider Notes (Signed)
Medical screening examination/treatment/procedure(s) were performed by non-physician practitioner and as supervising physician I was immediately available for consultation/collaboration.  Casady Voshell T Anne Sebring, MD 11/18/12 0712 

## 2013-04-16 ENCOUNTER — Emergency Department (HOSPITAL_COMMUNITY)
Admission: EM | Admit: 2013-04-16 | Discharge: 2013-04-16 | Disposition: A | Discharge status: Home / Self Care | Payer: Self-pay | Attending: Emergency Medicine | Admitting: Emergency Medicine

## 2013-04-16 ENCOUNTER — Encounter (HOSPITAL_COMMUNITY): Payer: Self-pay | Admitting: *Deleted

## 2013-04-16 DIAGNOSIS — Y9289 Other specified places as the place of occurrence of the external cause: Secondary | ICD-10-CM | POA: Insufficient documentation

## 2013-04-16 DIAGNOSIS — Z79899 Other long term (current) drug therapy: Secondary | ICD-10-CM | POA: Insufficient documentation

## 2013-04-16 DIAGNOSIS — Z880 Allergy status to penicillin: Secondary | ICD-10-CM | POA: Insufficient documentation

## 2013-04-16 DIAGNOSIS — S90569A Insect bite (nonvenomous), unspecified ankle, initial encounter: Secondary | ICD-10-CM | POA: Insufficient documentation

## 2013-04-16 DIAGNOSIS — I10 Essential (primary) hypertension: Secondary | ICD-10-CM | POA: Insufficient documentation

## 2013-04-16 DIAGNOSIS — Y9389 Activity, other specified: Secondary | ICD-10-CM | POA: Insufficient documentation

## 2013-04-16 DIAGNOSIS — E119 Type 2 diabetes mellitus without complications: Secondary | ICD-10-CM | POA: Insufficient documentation

## 2013-04-16 DIAGNOSIS — W57XXXA Bitten or stung by nonvenomous insect and other nonvenomous arthropods, initial encounter: Secondary | ICD-10-CM

## 2013-04-16 DIAGNOSIS — F17200 Nicotine dependence, unspecified, uncomplicated: Secondary | ICD-10-CM | POA: Insufficient documentation

## 2013-04-16 MED ORDER — DOXYCYCLINE HYCLATE 100 MG PO CAPS: 100.0000 mg | ORAL_CAPSULE | Freq: Two times a day (BID) | ORAL | Status: DC

## 2013-04-16 NOTE — ED Provider Notes (Signed)
History     CSN: [id number]  Arrival date & time 04/16/13  1108   First MD Initiated Contact with Patient 04/16/13 1125      Chief Complaint  Patient presents with  . Tick Removal    (Consider location/radiation/quality/duration/timing/severity/associated sxs/prior treatment) HPI Comments: Patient is a 52 year old male presents today for tick removal. He was bitten by a tick one week ago and removed the entire tick. He has recently felt a foreign body sensation in the area where the tick had bit him. The area has become red and swollen. It is tender to touch. He states in the past 4 days his leg has become numb below the bite. He said he is googled Lyme disease and [place] spotted fever and he is concerned considering he had a tick bite. He denies fever, chills, nausea, vomiting, abdominal pain, weakness, rash.  The history is provided by the patient. No language interpreter was used.    Past Medical History  Diagnosis Date  . Hypertension   . Diabetes mellitus     Past Surgical History  Procedure Laterality Date  . Hernia repair      Family History  Problem Relation Age of Onset  . Diabetes Mother   . Hypertension Mother   . Diabetes Father   . Hypertension Father     History  Substance Use Topics  . Smoking status: Current Every Day Smoker    Types: Cigarettes  . Smokeless tobacco: Never Used  . Alcohol Use: Yes      Review of Systems  Constitutional: Negative for fever and chills.  Gastrointestinal: Negative for nausea, vomiting and abdominal pain.  Skin: Positive for wound. Negative for rash.  All other systems reviewed and are negative.    Allergies  Oxycodone and Penicillins  Home Medications   Current Outpatient Rx  Name  Route  Sig  Dispense  Refill  . lisinopril-hydrochlorothiazide (PRINZIDE,ZESTORETIC) 20-25 MG per tablet   Oral   Take 1 tablet by mouth daily.           . Multiple Vitamin (MULITIVITAMIN WITH MINERALS) TABS   Oral  Take 1 tablet by mouth daily.           . Omega-3 Fatty Acids (FISH OIL) 1000 MG CAPS   Oral   Take 2,000 mg by mouth daily.           . vitamin C (ASCORBIC ACID) 500 MG tablet   Oral   Take 500 mg by mouth daily.           BP 144/101  Pulse 93  Temp(Src) 98.3 F (36.8 C) (Oral)  Resp 18  SpO2 98%  Physical Exam  Nursing note and vitals reviewed. Constitutional: He is oriented to person, place, and time. He appears well-developed and well-nourished. No distress.  HENT:  Head: Normocephalic and atraumatic.  Right Ear: External ear normal.  Left Ear: External ear normal.  Nose: Nose normal.  Eyes: Conjunctivae are normal.  Neck: Normal range of motion. No tracheal deviation present.  Cardiovascular: Normal rate, regular rhythm and normal heart sounds.   Pulmonary/Chest: Effort normal and breath sounds normal. No stridor.  Abdominal: Soft. He exhibits no distension. There is no tenderness.  Musculoskeletal: Normal range of motion.  Neurological: He is alert and oriented to person, place, and time.  Skin: Skin is warm and dry. He is not diaphoretic.  5mm ulceration with surrounding erythema, no streaking No rashes  Psychiatric: He has a normal mood and  affect. His behavior is normal.    ED Course  Procedures (including critical care time)  Labs Reviewed - No data to display No results found.   1. Tick bite       MDM  Patient presents with a one week after a tick bite. He states the area on his leg where he was is worsening. Surrounding erythema. No evidence of Lyme disease or [place] spotted fever. Afebrile. He was given 7 days of doxycycline. We discussed that this will cover both a possible early cellulitis as well as an unlikely case of Lyme disease. Dr. Denton Lank evaluated this patient agrees with plan. Return instructions given. Vital signs stable for discharge. Patient / Family / Caregiver informed of clinical course, understand medical  decision-making process, and agree with plan.         Mora Bellman, PA-C 04/16/13 1345

## 2013-04-16 NOTE — ED Notes (Signed)
Pt states got a tick bite on L upper leg about a week ago and it's not getting better, reddened bite present, pt states at times his leg feels numb, denies n/v/d, denies dizziness/shortness of breath or lightheadedness.

## 2013-04-17 NOTE — ED Provider Notes (Signed)
Medical screening examination/treatment/procedure(s) were conducted as a shared visit with non-physician practitioner(s) and myself.  I personally evaluated the patient during the encounter Pt had removed tick on lower leg, has been scratching area. Now mild spreading redness from area/scab. No fb/head remaining. No EM lesion or other rash. No fevers.   Suzi Roots, MD 04/17/13 [phone]

## 2013-07-01 ENCOUNTER — Encounter (HOSPITAL_COMMUNITY): Payer: Self-pay | Admitting: *Deleted

## 2013-07-01 ENCOUNTER — Emergency Department (HOSPITAL_COMMUNITY)
Admission: EM | Admit: 2013-07-01 | Discharge: 2013-07-01 | Disposition: A | Discharge status: Home / Self Care | Payer: Self-pay | Attending: Emergency Medicine | Admitting: Emergency Medicine

## 2013-07-01 ENCOUNTER — Emergency Department (HOSPITAL_COMMUNITY): Payer: Self-pay

## 2013-07-01 DIAGNOSIS — F17200 Nicotine dependence, unspecified, uncomplicated: Secondary | ICD-10-CM | POA: Insufficient documentation

## 2013-07-01 DIAGNOSIS — Z79899 Other long term (current) drug therapy: Secondary | ICD-10-CM | POA: Insufficient documentation

## 2013-07-01 DIAGNOSIS — X500XXA Overexertion from strenuous movement or load, initial encounter: Secondary | ICD-10-CM | POA: Insufficient documentation

## 2013-07-01 DIAGNOSIS — E119 Type 2 diabetes mellitus without complications: Secondary | ICD-10-CM | POA: Insufficient documentation

## 2013-07-01 DIAGNOSIS — S46909A Unspecified injury of unspecified muscle, fascia and tendon at shoulder and upper arm level, unspecified arm, initial encounter: Secondary | ICD-10-CM | POA: Insufficient documentation

## 2013-07-01 DIAGNOSIS — Y929 Unspecified place or not applicable: Secondary | ICD-10-CM | POA: Insufficient documentation

## 2013-07-01 DIAGNOSIS — Z87828 Personal history of other (healed) physical injury and trauma: Secondary | ICD-10-CM | POA: Insufficient documentation

## 2013-07-01 DIAGNOSIS — Y9389 Activity, other specified: Secondary | ICD-10-CM | POA: Insufficient documentation

## 2013-07-01 DIAGNOSIS — I10 Essential (primary) hypertension: Secondary | ICD-10-CM | POA: Insufficient documentation

## 2013-07-01 DIAGNOSIS — Z880 Allergy status to penicillin: Secondary | ICD-10-CM | POA: Insufficient documentation

## 2013-07-01 DIAGNOSIS — R209 Unspecified disturbances of skin sensation: Secondary | ICD-10-CM | POA: Insufficient documentation

## 2013-07-01 DIAGNOSIS — IMO0001 Reserved for inherently not codable concepts without codable children: Secondary | ICD-10-CM | POA: Insufficient documentation

## 2013-07-01 DIAGNOSIS — S4980XA Other specified injuries of shoulder and upper arm, unspecified arm, initial encounter: Secondary | ICD-10-CM | POA: Insufficient documentation

## 2013-07-01 DIAGNOSIS — M25512 Pain in left shoulder: Secondary | ICD-10-CM

## 2013-07-01 MED ORDER — IBUPROFEN 800 MG PO TABS: 800.0000 mg | ORAL_TABLET | Freq: Three times a day (TID) | ORAL | Status: DC

## 2013-07-01 MED ORDER — HYDROCODONE-ACETAMINOPHEN 5-325 MG PO TABS: 2.0000 | ORAL_TABLET | Freq: Four times a day (QID) | ORAL | Status: DC | PRN

## 2013-07-01 NOTE — ED Provider Notes (Signed)
History  This chart was scribed for non-physician practitioner Roxy Horseman, PA-C, working with Raeford Razor, MD, by Yevette Edwards, ED Scribe. This patient was seen in room WTR8/WTR8 and the patient's care was started at 5:01 PM.  CSN: [id number] Arrival date & time 07/01/13  1612  First MD Initiated Contact with Patient 07/01/13 1626     Chief Complaint  Patient presents with  . Arm Pain    The history is provided by the patient. No language interpreter was used.   HPI Comments: Justin Landry is a 52 y.o. male, with a h/o of a tear to his left shoulder rotator cuff and recurrent pain to his left shoulder, who presents to the Emergency Department complaining of pain to his left shoulder. He states the pain worsened suddenly three days ago while he was shoveling. The pt states that he heard his left shoulder "pop," and that the pain has been constant and severe since then. He also states that the pain has interrupted his sleep and that he has experienced numbness and tingling in his left arm. The pt reports using ibuprofen and ice to alleviate the pain to his left shoulder, but he states no resolution to the pain.   Past Medical History  Diagnosis Date  . Hypertension   . Diabetes mellitus    Past Surgical History  Procedure Laterality Date  . Hernia repair     Family History  Problem Relation Age of Onset  . Diabetes Mother   . Hypertension Mother   . Diabetes Father   . Hypertension Father    History  Substance Use Topics  . Smoking status: Current Every Day Smoker    Types: Cigarettes  . Smokeless tobacco: Never Used  . Alcohol Use: Yes    Review of Systems  Constitutional: Negative for fever.  Musculoskeletal: Positive for myalgias.  Neurological:       Tingling and numbness to left shoulder and arm.   All other systems reviewed and are negative.    Allergies  Oxycodone and Penicillins  Home Medications   Current Outpatient Rx  Name  Route  Sig   Dispense  Refill  . doxycycline (VIBRAMYCIN) 100 MG capsule   Oral   Take 1 capsule (100 mg total) by mouth 2 (two) times daily.   14 capsule   0   . ibuprofen (ADVIL,MOTRIN) 800 MG tablet   Oral   Take 800 mg by mouth every 8 (eight) hours as needed for pain.         Marland Kitchen lisinopril-hydrochlorothiazide (PRINZIDE,ZESTORETIC) 20-25 MG per tablet   Oral   Take 1 tablet by mouth daily.           . metFORMIN (GLUCOPHAGE) 500 MG tablet   Oral   Take 500 mg by mouth 2 (two) times daily with a meal.         . Multiple Vitamin (MULITIVITAMIN WITH MINERALS) TABS   Oral   Take 1 tablet by mouth daily.           . Omega-3 Fatty Acids (FISH OIL) 1000 MG CAPS   Oral   Take 2,000 mg by mouth daily.           . vitamin C (ASCORBIC ACID) 500 MG tablet   Oral   Take 500 mg by mouth daily.          Triage Vitals: BP 157/96  Pulse 93  Temp(Src) 98.3 F (36.8 C) (Oral)  Resp 18  Ht  5\' 7"  (1.702 m)  Wt 217 lb (98.431 kg)  BMI 33.98 kg/m2  SpO2 99%  Physical Exam  Nursing note and vitals reviewed. Constitutional: He is oriented to person, place, and time. He appears well-developed and well-nourished. No distress.  HENT:  Head: Normocephalic and atraumatic.  Eyes: EOM are normal.  Neck: Neck supple. No tracheal deviation present.  Cardiovascular: Normal rate.   Pulmonary/Chest: Effort normal. No respiratory distress.  Musculoskeletal: Normal range of motion.  Tenderness to palpation over the left AC joint. Empty can test is positive. Hawkins-Kennedy test is positive. ROM and strength limited secondary to pain. No bony abnormality or deformity.   Neurological: He is alert and oriented to person, place, and time.  Skin: Skin is warm and dry.  Psychiatric: He has a normal mood and affect. His behavior is normal.    ED Course  Procedures (including critical care time)  DIAGNOSTIC STUDIES: Oxygen Saturation is  99% on room air, normal by my interpretation.    COORDINATION  OF CARE:  5:04 PM-Discussed treatment plan with patient which includes Vicodin only as needed, exercises to strengthen his shoulder, a sling for his shoulder, and  follow-up with an orthopedic surgeon.  The patient agreed to the plan.  The pt also requested a note for his employment.   Labs Reviewed - No data to display Dg Shoulder Left  07/01/2013   *RADIOLOGY REPORT*  Clinical Data: Left shoulder pain.  LEFT SHOULDER - 2+ VIEW  Comparison: Plain films left shoulder 11/16/2012.  Findings: No acute bony or joint abnormality is identified. Acromioclavicular osteoarthritis is noted.  Imaged left lung and ribs are unremarkable.  IMPRESSION: No acute finding.  Acromioclavicular osteoarthritis.   Original Report Authenticated By: Holley Dexter, M.D.   1. Shoulder pain, acute, left     MDM  Patient with concern for rotator cuff injury. The patient a sling, and have him followup with orthopedics. Given the patient pain medicine, ibuprofen, and a note for work. Patient is stable and ready for discharge.  I personally performed the services described in this documentation, which was scribed in my presence. The recorded information has been reviewed and is accurate.     Roxy Horseman, PA-C 07/01/13 [phone]

## 2013-07-01 NOTE — ED Notes (Signed)
Pt states has a hx of rotator cuff tear in L shoulder, states Friday was shoveling at work and heard L shoulder pop, since has had severe pain in L shoulder.

## 2013-07-05 NOTE — ED Provider Notes (Signed)
Medical screening examination/treatment/procedure(s) were performed by non-physician practitioner and as supervising physician I was immediately available for consultation/collaboration.  Raeford Razor, MD 07/05/13 2306

## 2013-11-08 ENCOUNTER — Emergency Department (HOSPITAL_COMMUNITY)
Admission: EM | Admit: 2013-11-08 | Discharge: 2013-11-08 | Disposition: A | Discharge status: Home / Self Care | Payer: Self-pay | Attending: Emergency Medicine | Admitting: Emergency Medicine

## 2013-11-08 ENCOUNTER — Emergency Department (HOSPITAL_COMMUNITY): Payer: Self-pay

## 2013-11-08 ENCOUNTER — Encounter (HOSPITAL_COMMUNITY): Payer: Self-pay | Admitting: Emergency Medicine

## 2013-11-08 DIAGNOSIS — Z791 Long term (current) use of non-steroidal anti-inflammatories (NSAID): Secondary | ICD-10-CM | POA: Insufficient documentation

## 2013-11-08 DIAGNOSIS — S42301A Unspecified fracture of shaft of humerus, right arm, initial encounter for closed fracture: Secondary | ICD-10-CM

## 2013-11-08 DIAGNOSIS — Y9289 Other specified places as the place of occurrence of the external cause: Secondary | ICD-10-CM | POA: Insufficient documentation

## 2013-11-08 DIAGNOSIS — S42293A Other displaced fracture of upper end of unspecified humerus, initial encounter for closed fracture: Secondary | ICD-10-CM | POA: Insufficient documentation

## 2013-11-08 DIAGNOSIS — W1809XA Striking against other object with subsequent fall, initial encounter: Secondary | ICD-10-CM | POA: Insufficient documentation

## 2013-11-08 DIAGNOSIS — F17200 Nicotine dependence, unspecified, uncomplicated: Secondary | ICD-10-CM | POA: Insufficient documentation

## 2013-11-08 DIAGNOSIS — W1789XA Other fall from one level to another, initial encounter: Secondary | ICD-10-CM | POA: Insufficient documentation

## 2013-11-08 DIAGNOSIS — Y9339 Activity, other involving climbing, rappelling and jumping off: Secondary | ICD-10-CM | POA: Insufficient documentation

## 2013-11-08 DIAGNOSIS — Z792 Long term (current) use of antibiotics: Secondary | ICD-10-CM | POA: Insufficient documentation

## 2013-11-08 DIAGNOSIS — I10 Essential (primary) hypertension: Secondary | ICD-10-CM | POA: Insufficient documentation

## 2013-11-08 DIAGNOSIS — Z79899 Other long term (current) drug therapy: Secondary | ICD-10-CM | POA: Insufficient documentation

## 2013-11-08 DIAGNOSIS — E119 Type 2 diabetes mellitus without complications: Secondary | ICD-10-CM | POA: Insufficient documentation

## 2013-11-08 DIAGNOSIS — Z880 Allergy status to penicillin: Secondary | ICD-10-CM | POA: Insufficient documentation

## 2013-11-08 LAB — GLUCOSE, CAPILLARY: Glucose-Capillary: 254 mg/dL — ABNORMAL HIGH (ref 70–99)

## 2013-11-08 MED ORDER — HYDROCODONE-ACETAMINOPHEN 5-325 MG PO TABS: 1.0000 | ORAL_TABLET | ORAL | Status: DC | PRN

## 2013-11-08 NOTE — ED Notes (Signed)
Per pt, twisted coming out of deer stand yesterday striking right shoulder.  Shoulder swollen and painful.

## 2013-11-08 NOTE — ED Provider Notes (Signed)
Medical screening examination/treatment/procedure(s) were performed by non-physician practitioner and as supervising physician I was immediately available for consultation/collaboration.  EKG Interpretation   None         Rolan Bucco, MD 11/08/13 1156

## 2013-11-08 NOTE — ED Provider Notes (Signed)
CSN: [id number]     Arrival date & time 11/08/13  4098 History   None    Chief Complaint  Patient presents with  . Shoulder Injury    HPI  Justin Landry is a 52 y.o. male with a PMH of DM and HTN who presents to the ED for evaluation of shoulder injury.  History was provided by the patient.  Patient states that yesterday morning he was climbing out of his tree stand when his foot slipped out of the bottom step on the tree stand and he fell forward striking his right shoulder into the tree. He denies any other injuries including a head injury/LOC. Patient states that he is having constant, aching anterior right shoulder pain.  His pain is worse with movement. He took a ibuprofen which minimally improved his pain. He has had left shoulder problems in the past however denies any problems with his right shoulder. He states that he has seen an orthopedic doctor for a torn left rotator cuff via MRI. He denies any previous surgeries or injuries to the right shoulder. He states that his symptoms today are similar to his left shoulder. He denies any numbness, tingling, loss of sensation, or weakness.  He otherwise has been well with no recent fevers, chills, change in appetite/activity, abdominal pain, nausea, emesis, chest pain, SOB, back pain, neck pain, or headaches.  Patient left and right handed.     Past Medical History  Diagnosis Date  . Hypertension   . Diabetes mellitus    Past Surgical History  Procedure Laterality Date  . Hernia repair     Family History  Problem Relation Age of Onset  . Diabetes Mother   . Hypertension Mother   . Diabetes Father   . Hypertension Father    History  Substance Use Topics  . Smoking status: Current Every Day Smoker    Types: Cigarettes  . Smokeless tobacco: Never Used  . Alcohol Use: Yes    Review of Systems  Constitutional: Negative for fever, chills, activity change, appetite change and fatigue.  HENT: Negative for congestion and  rhinorrhea.   Respiratory: Negative for cough and shortness of breath.   Cardiovascular: Negative for chest pain, palpitations and leg swelling.  Gastrointestinal: Negative for nausea, vomiting and abdominal pain.  Musculoskeletal: Positive for arthralgias (right shoulder). Negative for back pain, myalgias, neck pain and neck stiffness.  Skin: Negative for color change and wound.  Neurological: Negative for dizziness, syncope, weakness, light-headedness, numbness and headaches.    Allergies  Oxycodone and Penicillins  Home Medications   Current Outpatient Rx  Name  Route  Sig  Dispense  Refill  . doxycycline (VIBRAMYCIN) 100 MG capsule   Oral   Take 1 capsule (100 mg total) by mouth 2 (two) times daily.   14 capsule   0   . HYDROcodone-acetaminophen (NORCO/VICODIN) 5-325 MG per tablet   Oral   Take 2 tablets by mouth every 6 (six) hours as needed for pain.   15 tablet   0   . ibuprofen (ADVIL,MOTRIN) 800 MG tablet   Oral   Take 800 mg by mouth every 8 (eight) hours as needed for pain.         Marland Kitchen ibuprofen (ADVIL,MOTRIN) 800 MG tablet   Oral   Take 1 tablet (800 mg total) by mouth 3 (three) times daily.   21 tablet   0   . lisinopril-hydrochlorothiazide (PRINZIDE,ZESTORETIC) 20-25 MG per tablet   Oral   Take 1  tablet by mouth daily.           . metFORMIN (GLUCOPHAGE) 500 MG tablet   Oral   Take 500 mg by mouth 2 (two) times daily with a meal.         . Multiple Vitamin (MULITIVITAMIN WITH MINERALS) TABS   Oral   Take 1 tablet by mouth daily.           . Omega-3 Fatty Acids (FISH OIL) 1000 MG CAPS   Oral   Take 2,000 mg by mouth daily.           . vitamin C (ASCORBIC ACID) 500 MG tablet   Oral   Take 500 mg by mouth daily.          BP 168/110  Pulse 108  Temp(Src) 98.2 F (36.8 C) (Oral)  Resp 20  Ht 5\' 7"  (1.702 m)  Wt 217 lb (98.431 kg)  BMI 33.98 kg/m2  SpO2 97%  Filed Vitals:   11/08/13 0649 11/08/13 0720  BP: 168/110 148/102   Pulse: 108 95  Temp: 98.2 F (36.8 C)   TempSrc: Oral   Resp: 20 16  Height: 5\' 7"  (1.702 m)   Weight: 217 lb (98.431 kg)   SpO2: 97% 98%    Physical Exam  Nursing note and vitals reviewed. Constitutional: He is oriented to person, place, and time. He appears well-developed and well-nourished. No distress.  HENT:  Head: Normocephalic and atraumatic.  Right Ear: External ear normal.  Left Ear: External ear normal.  Nose: Nose normal.  Mouth/Throat: Oropharynx is clear and moist.  Eyes: Conjunctivae are normal. Right eye exhibits no discharge. Left eye exhibits no discharge.  Neck: Normal range of motion. Neck supple.  No cervical spinal tenderness  Cardiovascular: Normal rate, regular rhythm and normal heart sounds.  Exam reveals no gallop and no friction rub.   No murmur heard. Radial pulses present and equal bilaterally  Pulmonary/Chest: Effort normal and breath sounds normal. No respiratory distress. He has no wheezes. He has no rales. He exhibits no tenderness.  Abdominal: Soft. He exhibits no distension. There is no tenderness.  Musculoskeletal: Normal range of motion. He exhibits tenderness. He exhibits no edema.       Arms: Tenderness to palpation to the right anterior shoulder. Unable to assess range of motion of the right shoulder due to pain. No tenderness to palpation to the posterior right shoulder. No tenderness to palpation to the right elbow, forearm, wrist, or hand. Patient able to actively flex and extend right elbow without difficulty. Grip strength 5 out of 5 bilaterally. No thoracic or lumbar tenderness to palpation. No clavicle or chest tenderness.  Neurological: He is alert and oriented to person, place, and time.  Sensation intact in the right upper extremity throughout.  Skin: Skin is warm and dry. He is not diaphoretic.  No ecchymosis, edema, your edema, or open wounds to the right arm throughout.    ED Course  Procedures (including critical care  time) Labs Review Labs Reviewed - No data to display Imaging Review No results found.  EKG Interpretation   None       DG Shoulder Right (Final result)  Result time: 11/08/13 07:17:11    Final result by Rad Results In Interface (11/08/13 07:17:11)    Narrative:   CLINICAL DATA: Fall from deer stand. Right shoulder pain with limited range of motion.  EXAM: RIGHT SHOULDER - 2+ VIEW  COMPARISON: None.  FINDINGS: The humeral head appears slightly  anteriorly subluxed on the Y-views. There is no dislocation. There is suspicion of an impaction fraction of the humeral head laterally. Underlying acromioclavicular and mild glenohumeral degenerative changes are noted.  IMPRESSION: Suspected impaction fracture of the humeral head laterally with mild anterior subluxation of the humerus. Is there a history of dislocation?   Electronically Signed By: Roxy Horseman M.D. On: 11/08/2013 07:17        Results for orders placed during the hospital encounter of 11/08/13  GLUCOSE, CAPILLARY      Result Value Range   Glucose-Capillary 254 (*) 70 - 99 mg/dL    MDM   Justin Landry is a 52 y.o. male with a PMH of DM and HTN who presents to the ED for evaluation of shoulder injury.  Right shoulder x-rays ordered.  Patient declined pain medications. He drove himself to the ER.  POC glucose ordered.     Rechecks  8:00 AM = spoke with patient regarding results of x-rays. Patient in right arm sling. Neurovascularly intact. Spoke with patient about blood sugar. Patient states that his blood sugars have been well-controlled. He cannot explain why his sugar is so high today. Patient states he is taking his metformin with no missed doses. Patient states that he did eat breakfast this morning and take his metformin. Patient states that he saw his primary care provider a week and half ago. He states that he will continue to monitor his blood sugar at home and call his primary doctor if his  blood sugars are continuing to elevate. Patient states that he occasionally gets nauseated on Percocet. He has not had any problems with Vicodin in the past. He states he has not like to take pills and only take Vicodin as needed. Patient has some ROM with flexion, extension, and abduction, however, is limited due to pain.    Etiology of right shoulder pain weekly due to to a impaction fracture of the right humerus. X-rays were negative for dislocation however showed mild anterior subluxation. Patient was placed in a sling. He was given referral to his previous orthopedic surgeon from his left shoulder. Patient was neurovascularly intact. Patient had no open wounds or lacerations. He was instructed to use the RICE method.  Return precautions were discussed. Patient in agreement with discharge and plan       Discharge Medication List as of 11/08/2013  7:56 AM    START taking these medications   Details  HYDROcodone-acetaminophen (NORCO/VICODIN) 5-325 MG per tablet Take 1-2 tablets by mouth every 4 (four) hours as needed., Starting 11/08/2013, Until Discontinued, Print         Final impressions: 1. Fracture, humerus, right, closed, initial encounter   2. Diabetes mellitus       Luiz Iron PA-C   This patient was discussed with Dr. Susy Frizzle, PA-C 11/08/13 [phone]

## 2013-11-11 ENCOUNTER — Encounter (HOSPITAL_COMMUNITY): Payer: Self-pay | Admitting: Emergency Medicine

## 2013-11-11 ENCOUNTER — Emergency Department (HOSPITAL_COMMUNITY)
Admission: EM | Admit: 2013-11-11 | Discharge: 2013-11-11 | Disposition: A | Discharge status: Home / Self Care | Payer: Self-pay | Attending: Emergency Medicine | Admitting: Emergency Medicine

## 2013-11-11 DIAGNOSIS — I10 Essential (primary) hypertension: Secondary | ICD-10-CM | POA: Insufficient documentation

## 2013-11-11 DIAGNOSIS — E119 Type 2 diabetes mellitus without complications: Secondary | ICD-10-CM | POA: Insufficient documentation

## 2013-11-11 DIAGNOSIS — F17200 Nicotine dependence, unspecified, uncomplicated: Secondary | ICD-10-CM | POA: Insufficient documentation

## 2013-11-11 DIAGNOSIS — Z880 Allergy status to penicillin: Secondary | ICD-10-CM | POA: Insufficient documentation

## 2013-11-11 DIAGNOSIS — S42309D Unspecified fracture of shaft of humerus, unspecified arm, subsequent encounter for fracture with routine healing: Secondary | ICD-10-CM | POA: Insufficient documentation

## 2013-11-11 DIAGNOSIS — Z79899 Other long term (current) drug therapy: Secondary | ICD-10-CM | POA: Insufficient documentation

## 2013-11-11 DIAGNOSIS — S42301S Unspecified fracture of shaft of humerus, right arm, sequela: Secondary | ICD-10-CM

## 2013-11-11 HISTORY — DX: Unspecified fracture of shaft of humerus, unspecified arm, initial encounter for closed fracture: S42.309A

## 2013-11-11 MED ORDER — OXYCODONE-ACETAMINOPHEN 5-325 MG PO TABS: 1.0000 | ORAL_TABLET | Freq: Four times a day (QID) | ORAL | Status: DC | PRN

## 2013-11-11 NOTE — ED Provider Notes (Signed)
CSN: [id number]     Arrival date & time 11/11/13  1443 History   First MD Initiated Contact with Patient 11/11/13 1509     Chief Complaint  Patient presents with  . humeral fx, hand numbness    (Consider location/radiation/quality/duration/timing/severity/associated sxs/prior Treatment) HPI Patient presents to the emergency department with continued pain in his arm on the right.  Patient, states he was diagnosed with a humeral fracture.  The patient, states he needs better pain control, and a note for work.  Patient, states she's had tingling in his fingers at times, but not consistently.  Patient, states pain increases with range of motion.  Patient denies weakness, and dizziness, chest pain, or shortness of breath Past Medical History  Diagnosis Date  . Hypertension   . Diabetes mellitus   . Humerus fracture     right   Past Surgical History  Procedure Laterality Date  . Hernia repair     Family History  Problem Relation Age of Onset  . Diabetes Mother   . Hypertension Mother   . Diabetes Father   . Hypertension Father    History  Substance Use Topics  . Smoking status: Current Every Day Smoker    Types: Cigarettes  . Smokeless tobacco: Never Used  . Alcohol Use: Yes    Review of Systems All other systems negative except as documented in the HPI. All pertinent positives and negatives as reviewed in the HPI. Allergies  Oxycodone and Penicillins  Home Medications   Current Outpatient Rx  Name  Route  Sig  Dispense  Refill  . HYDROcodone-acetaminophen (NORCO/VICODIN) 5-325 MG per tablet   Oral   Take 1 tablet by mouth every 6 (six) hours as needed for moderate pain.         Marland Kitchen ibuprofen (ADVIL,MOTRIN) 200 MG tablet   Oral   Take 800 mg by mouth every 6 (six) hours as needed for mild pain or moderate pain.         Marland Kitchen lisinopril-hydrochlorothiazide (PRINZIDE,ZESTORETIC) 20-25 MG per tablet   Oral   Take 1 tablet by mouth daily.          . metFORMIN  (GLUCOPHAGE) 500 MG tablet   Oral   Take 500 mg by mouth 2 (two) times daily with a meal.         . Multiple Vitamin (MULITIVITAMIN WITH MINERALS) TABS   Oral   Take 1 tablet by mouth daily.           . Omega-3 Fatty Acids (FISH OIL) 1000 MG CAPS   Oral   Take 2,000 mg by mouth daily.          . vitamin C (ASCORBIC ACID) 500 MG tablet   Oral   Take 500 mg by mouth daily.          BP 140/94  Pulse 104  Temp(Src) 97.9 F (36.6 C) (Oral)  Resp 16  SpO2 94% Physical Exam  Nursing note and vitals reviewed. Constitutional: He is oriented to person, place, and time. He appears well-developed and well-nourished. No distress.  HENT:  Head: Normocephalic and atraumatic.  Eyes: Pupils are equal, round, and reactive to light.  Neck: Normal range of motion. Neck supple.  Pulmonary/Chest: Effort normal. No respiratory distress.  Musculoskeletal:       Right shoulder: He exhibits decreased range of motion, tenderness and pain. He exhibits no deformity, normal pulse and normal strength.       Arms: Neurological: He is alert and  oriented to person, place, and time. He has normal strength. No sensory deficit. He exhibits normal muscle tone. Coordination normal.  Skin: Skin is warm and dry. No rash noted. No erythema.    ED Course  Procedures (including critical care time) Patient will be referred to orthopedics, as directed.  Previously, is advised to use ice, heat on her shoulder.  Told to return here as needed. There is no neurological deficits noted on exam   Carlyle Dolly, PA-C 11/11/13 1609

## 2013-11-11 NOTE — ED Notes (Signed)
Pt reports he fractured his right humerus on thanksgiving, was seen in ED on thanksgiving. Pt is suppose to call orthopedic tomorrow. Pt does not have insurance, so orthopedic will make pt have $500 before pt can be seen. Pt reports that intermittent hand numbness starting yesterday, unable to grasp a glass. Pain 10/10. 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  pts bil radial pulse strong.

## 2013-11-11 NOTE — ED Provider Notes (Signed)
Medical screening examination/treatment/procedure(s) were performed by non-physician practitioner and as supervising physician I was immediately available for consultation/collaboration.  EKG Interpretation   None        Ethelda Chick, MD 11/11/13 [phone]

## 2013-11-23 ENCOUNTER — Emergency Department (HOSPITAL_COMMUNITY): Payer: Self-pay

## 2013-11-23 ENCOUNTER — Emergency Department (HOSPITAL_COMMUNITY)
Admission: EM | Admit: 2013-11-23 | Discharge: 2013-11-23 | Disposition: A | Discharge status: Home / Self Care | Payer: Self-pay | Attending: Emergency Medicine | Admitting: Emergency Medicine

## 2013-11-23 ENCOUNTER — Encounter (HOSPITAL_COMMUNITY): Payer: Self-pay | Admitting: Emergency Medicine

## 2013-11-23 DIAGNOSIS — S4980XA Other specified injuries of shoulder and upper arm, unspecified arm, initial encounter: Secondary | ICD-10-CM | POA: Insufficient documentation

## 2013-11-23 DIAGNOSIS — S46909A Unspecified injury of unspecified muscle, fascia and tendon at shoulder and upper arm level, unspecified arm, initial encounter: Secondary | ICD-10-CM | POA: Insufficient documentation

## 2013-11-23 DIAGNOSIS — E119 Type 2 diabetes mellitus without complications: Secondary | ICD-10-CM | POA: Insufficient documentation

## 2013-11-23 DIAGNOSIS — Z880 Allergy status to penicillin: Secondary | ICD-10-CM | POA: Insufficient documentation

## 2013-11-23 DIAGNOSIS — Y929 Unspecified place or not applicable: Secondary | ICD-10-CM | POA: Insufficient documentation

## 2013-11-23 DIAGNOSIS — Y93H9 Activity, other involving exterior property and land maintenance, building and construction: Secondary | ICD-10-CM | POA: Insufficient documentation

## 2013-11-23 DIAGNOSIS — F17200 Nicotine dependence, unspecified, uncomplicated: Secondary | ICD-10-CM | POA: Insufficient documentation

## 2013-11-23 DIAGNOSIS — R209 Unspecified disturbances of skin sensation: Secondary | ICD-10-CM | POA: Insufficient documentation

## 2013-11-23 DIAGNOSIS — S4991XD Unspecified injury of right shoulder and upper arm, subsequent encounter: Secondary | ICD-10-CM

## 2013-11-23 DIAGNOSIS — S42309A Unspecified fracture of shaft of humerus, unspecified arm, initial encounter for closed fracture: Secondary | ICD-10-CM | POA: Insufficient documentation

## 2013-11-23 DIAGNOSIS — X500XXA Overexertion from strenuous movement or load, initial encounter: Secondary | ICD-10-CM | POA: Insufficient documentation

## 2013-11-23 DIAGNOSIS — Z87828 Personal history of other (healed) physical injury and trauma: Secondary | ICD-10-CM | POA: Insufficient documentation

## 2013-11-23 DIAGNOSIS — Z79899 Other long term (current) drug therapy: Secondary | ICD-10-CM | POA: Insufficient documentation

## 2013-11-23 DIAGNOSIS — I10 Essential (primary) hypertension: Secondary | ICD-10-CM | POA: Insufficient documentation

## 2013-11-23 MED ORDER — HYDROCODONE-ACETAMINOPHEN 5-325 MG PO TABS
1.0000 | ORAL_TABLET | Freq: Once | ORAL | Status: AC
  Administered 2013-11-23: 1 via ORAL
  Filled 2013-11-23: qty 1

## 2013-11-23 MED ORDER — METHOCARBAMOL 500 MG PO TABS: 500.0000 mg | ORAL_TABLET | Freq: Two times a day (BID) | ORAL | Status: DC

## 2013-11-23 MED ORDER — HYDROCODONE-ACETAMINOPHEN 5-325 MG PO TABS: 1.0000 | ORAL_TABLET | ORAL | Status: DC | PRN

## 2013-11-23 NOTE — ED Provider Notes (Signed)
CSN: [id number]     Arrival date & time 11/23/13  1330 History  This chart was scribed for non-physician practitioner, Fayrene Helper, PA-C working with Enid Skeens, MD by Greggory Stallion, ED scribe. This patient was seen in room WTR8/WTR8 and the patient's care was started at 2:24 PM.   Chief Complaint  Patient presents with  . right arm pain    The history is provided by the patient. No language interpreter was used.   HPI Comments: Justin Landry is a 52 y.o. male who presents to the Emergency Department complaining of right arm re-injury that started today around 9:30 AM when he was moving a concrete block. He states he broke his right arm 2 weeks ago while working on a deck, evaluated here and given a sling and ortho referral. Pt was not doing light duty as advised when he re-injured his arm. He has sudden onset, sharp right arm pain with associated numbness and tingling. Denies elbow pain. Sts tingling has improved but arm is painful with movement.    Past Medical History  Diagnosis Date  . Hypertension   . Diabetes mellitus   . Humerus fracture     right   Past Surgical History  Procedure Laterality Date  . Hernia repair     Family History  Problem Relation Age of Onset  . Diabetes Mother   . Hypertension Mother   . Diabetes Father   . Hypertension Father    History  Substance Use Topics  . Smoking status: Current Every Day Smoker    Types: Cigarettes  . Smokeless tobacco: Never Used  . Alcohol Use: Yes    Review of Systems  Musculoskeletal: Positive for arthralgias and myalgias.  Neurological: Positive for numbness.    Allergies  Oxycodone and Penicillins  Home Medications   Current Outpatient Rx  Name  Route  Sig  Dispense  Refill  . Cyanocobalamin (VITAMIN B 12 PO)   Oral   Take 2 tablets by mouth daily.         Marland Kitchen ibuprofen (ADVIL,MOTRIN) 200 MG tablet   Oral   Take 800 mg by mouth every 6 (six) hours as needed for mild pain or moderate pain.        Marland Kitchen lisinopril-hydrochlorothiazide (PRINZIDE,ZESTORETIC) 20-25 MG per tablet   Oral   Take 1 tablet by mouth daily.          . metFORMIN (GLUCOPHAGE) 500 MG tablet   Oral   Take 500 mg by mouth 2 (two) times daily with a meal.         . Multiple Vitamin (MULITIVITAMIN WITH MINERALS) TABS   Oral   Take 1 tablet by mouth daily.           . Omega-3 Fatty Acids (FISH OIL) 1000 MG CAPS   Oral   Take 2,000 mg by mouth daily.          . vitamin C (ASCORBIC ACID) 500 MG tablet   Oral   Take 500 mg by mouth daily.          There were no vitals taken for this visit.  Physical Exam  Nursing note and vitals reviewed. Constitutional: He is oriented to person, place, and time. He appears well-developed and well-nourished. No distress.  HENT:  Head: Normocephalic and atraumatic.  Eyes: EOM are normal.  Neck: Neck supple. No tracheal deviation present.  Cardiovascular: Normal rate.   Pulses intact.   Pulmonary/Chest: Effort normal. No respiratory distress.  Musculoskeletal: Normal range of motion.  Right shoulder moderated tenderness throughout humeral region. Tenderness to the glenohumeral joint. Limited ROM with both abduction, internal rotation and raising arm.   Neurological: He is alert and oriented to person, place, and time.  Neurovascularly intact.   Normal grip strength.   Skin: Skin is warm and dry.  Psychiatric: He has a normal mood and affect. His behavior is normal.    ED Course  Procedures (including critical care time)  DIAGNOSTIC STUDIES: Oxygen Saturation is 98% on room airl, normal by my interpretation.    COORDINATION OF CARE: 2:30 PM-Discussed treatment plan which includes pain medication, continued use of sling and following up with orthopedics with pt at bedside and pt agreed to plan.   Labs Review Labs Reviewed - No data to display Imaging Review Dg Humerus Right  11/23/2013   CLINICAL DATA:  Shoulder pain secondary to trauma. Recent  humeral head impaction fracture.  EXAM: RIGHT HUMERUS - 2+ VIEW  COMPARISON:  Radiographs dated 11/08/2013  FINDINGS: Again noted is the contour abnormality of the posterior aspect of the humeral head consistent with an impaction fracture, unchanged. No acute abnormality of the humerus. Slight degenerative changes at the acromioclavicular joint.  IMPRESSION: No acute abnormality. Prior small impaction fracture of the posterior aspect of the humeral head.   Electronically Signed   By: Geanie Cooley M.D.   On: 11/23/2013 14:17    EKG Interpretation   None       MDM   1. Arm injury, right, subsequent encounter    BP 133/87  Pulse 88  Temp(Src) 98.3 F (36.8 C) (Oral)  Resp 18  SpO2 98%   I have reviewed nursing notes and vital signs. I personally reviewed the imaging tests through PACS system  I reviewed available ER/hospitalization records thought the EMR  I personally performed the services described in this documentation, which was scribed in my presence. The recorded information has been reviewed and is accurate.    Fayrene Helper, PA-C 11/23/13 1521

## 2013-11-23 NOTE — ED Notes (Signed)
Per pt, hit right hand/arm at work-broke it 3 weeks ago, now having numbness/tingling

## 2013-11-23 NOTE — ED Provider Notes (Signed)
Medical screening examination/treatment/procedure(s) were performed by non-physician practitioner and as supervising physician I was immediately available for consultation/collaboration.  EKG Interpretation   None         Enid Skeens, MD 11/23/13 2042

## 2013-12-13 HISTORY — PX: CYSTOSCOPY: SUR368

## 2014-03-06 ENCOUNTER — Emergency Department (HOSPITAL_COMMUNITY): Payer: BC Managed Care – PPO

## 2014-03-06 ENCOUNTER — Emergency Department (HOSPITAL_COMMUNITY)
Admission: EM | Admit: 2014-03-06 | Discharge: 2014-03-06 | Disposition: A | Discharge status: Home / Self Care | Payer: BC Managed Care – PPO | Attending: Emergency Medicine | Admitting: Emergency Medicine

## 2014-03-06 ENCOUNTER — Encounter (HOSPITAL_COMMUNITY): Payer: Self-pay | Admitting: Emergency Medicine

## 2014-03-06 DIAGNOSIS — Z8781 Personal history of (healed) traumatic fracture: Secondary | ICD-10-CM | POA: Insufficient documentation

## 2014-03-06 DIAGNOSIS — Z880 Allergy status to penicillin: Secondary | ICD-10-CM | POA: Insufficient documentation

## 2014-03-06 DIAGNOSIS — Z79899 Other long term (current) drug therapy: Secondary | ICD-10-CM | POA: Insufficient documentation

## 2014-03-06 DIAGNOSIS — E119 Type 2 diabetes mellitus without complications: Secondary | ICD-10-CM | POA: Insufficient documentation

## 2014-03-06 DIAGNOSIS — K4090 Unilateral inguinal hernia, without obstruction or gangrene, not specified as recurrent: Secondary | ICD-10-CM | POA: Insufficient documentation

## 2014-03-06 DIAGNOSIS — I10 Essential (primary) hypertension: Secondary | ICD-10-CM | POA: Insufficient documentation

## 2014-03-06 DIAGNOSIS — F17200 Nicotine dependence, unspecified, uncomplicated: Secondary | ICD-10-CM | POA: Insufficient documentation

## 2014-03-06 LAB — URINALYSIS, ROUTINE W REFLEX MICROSCOPIC
Bilirubin Urine: NEGATIVE
Glucose, UA: >1000 mg/dL — AB
Hgb urine dipstick: NEGATIVE
Ketones, ur: NEGATIVE mg/dL
LEUKOCYTES UA: NEGATIVE
Nitrite: NEGATIVE
PH: 6.5 (ref 5.0–8.0)
Protein, ur: NEGATIVE mg/dL
SPECIFIC GRAVITY, URINE: 1.025 (ref 1.005–1.030)
Urobilinogen, UA: 1 mg/dL (ref 0.0–1.0)

## 2014-03-06 LAB — URINE MICROSCOPIC-ADD ON

## 2014-03-06 LAB — CBC
HCT: 45.4 % (ref 39.0–52.0)
HEMOGLOBIN: 16.5 g/dL (ref 13.0–17.0)
MCH: 31.9 pg (ref 26.0–34.0)
MCHC: 36.3 g/dL — ABNORMAL HIGH (ref 30.0–36.0)
MCV: 87.6 fL (ref 78.0–100.0)
PLATELETS: 269 10*3/uL (ref 150–400)
RBC: 5.18 MIL/uL (ref 4.22–5.81)
RDW: 12.8 % (ref 11.5–15.5)
WBC: 11.2 10*3/uL — ABNORMAL HIGH (ref 4.0–10.5)

## 2014-03-06 LAB — BASIC METABOLIC PANEL
BUN: 16 mg/dL (ref 6–23)
CO2: 25 mEq/L (ref 19–32)
Calcium: 10.2 mg/dL (ref 8.4–10.5)
Chloride: 99 mEq/L (ref 96–112)
Creatinine, Ser: 0.76 mg/dL (ref 0.50–1.35)
GFR calc Af Amer: >90 mL/min (ref >90)
GFR calc non Af Amer: >90 mL/min (ref >90)
Glucose, Bld: 162 mg/dL — ABNORMAL HIGH (ref 70–99)
POTASSIUM: 3.8 meq/L (ref 3.7–5.3)
SODIUM: 137 meq/L (ref 137–147)

## 2014-03-06 MED ORDER — IOHEXOL 300 MG/ML  SOLN
100.0000 mL | Freq: Once | INTRAMUSCULAR | Status: AC | PRN
  Administered 2014-03-06: 100 mL via INTRAVENOUS

## 2014-03-06 MED ORDER — HYDROMORPHONE HCL PF 1 MG/ML IJ SOLN
1.0000 mg | Freq: Once | INTRAMUSCULAR | Status: AC
  Administered 2014-03-06: 1 mg via INTRAMUSCULAR
  Filled 2014-03-06: qty 1

## 2014-03-06 MED ORDER — IOHEXOL 300 MG/ML  SOLN
50.0000 mL | Freq: Once | INTRAMUSCULAR | Status: AC | PRN
  Administered 2014-03-06: 50 mL via ORAL

## 2014-03-06 MED ORDER — HYDROCODONE-ACETAMINOPHEN 5-325 MG PO TABS: 1.0000 | ORAL_TABLET | Freq: Four times a day (QID) | ORAL | Status: DC | PRN

## 2014-03-06 NOTE — ED Notes (Signed)
Bed: WA02 Expected date:  Expected time:  Means of arrival:  Comments: 

## 2014-03-06 NOTE — ED Notes (Signed)
His CT contrast is delivered by their tech., Selena BattenKim.  He is awake, alert and thanks us for sitting him up to drink.

## 2014-03-06 NOTE — Progress Notes (Signed)
[facility] provided patient with a list of primary care resources. Patient stated that he was pending insurance through job 4/1.

## 2014-03-06 NOTE — ED Notes (Signed)
He states he has had right inguinal hernorrhaphy in the past; and that recently it "puffed up a little".  He further tells me that "It popped out again this morning and got big, burning and painful".  He does have a visible area of edema at right groin/femoral area which is not in any way discolored.  His bed has been tilted head-down y the provider, which I maintain.

## 2014-03-06 NOTE — ED Notes (Signed)
He is asking to leave.  His significant other convinces him to stay "to have all your tests".  He is in no distress and tells us he is "a lot better than when I came in."

## 2014-03-06 NOTE — ED Provider Notes (Signed)
CSN: [id number]     Arrival date & time 03/06/14  4098 History   First MD Initiated Contact with Patient 03/06/14 1054     Chief Complaint  Patient presents with  . Hernia     (Consider location/radiation/quality/duration/timing/severity/associated sxs/prior Treatment) Patient is a 53 y.o. male presenting with male genitourinary complaint and groin pain. The history is provided by the patient.  Male GU Problem Presenting symptoms: no penile discharge, no penile pain and no scrotal pain   Presenting symptoms comment:  R groin pain Context: spontaneously (while riding in a car)   Relieved by:  Nothing Worsened by:  Nothing tried Ineffective treatments:  None tried Associated symptoms: groin pain   Associated symptoms: no abdominal pain, no diarrhea, no fever, no genital itching, no genital lesions, no genital rash, no hematuria, no penile redness, no penile swelling, no scrotal swelling, no urinary frequency, no urinary hesitation and no vomiting   Risk factors comment:  Hernia surgery 4 years ago Groin Pain This is a recurrent problem. The current episode started 1 to 2 hours ago. The problem occurs constantly. The problem has not changed since onset.Pertinent negatives include no abdominal pain.    Past Medical History  Diagnosis Date  . Hypertension   . Diabetes mellitus   . Humerus fracture     right   Past Surgical History  Procedure Laterality Date  . Hernia repair     Family History  Problem Relation Age of Onset  . Diabetes Mother   . Hypertension Mother   . Diabetes Father   . Hypertension Father    History  Substance Use Topics  . Smoking status: Current Every Day Smoker    Types: Cigarettes  . Smokeless tobacco: Never Used  . Alcohol Use: Yes    Review of Systems  Constitutional: Negative for fever.  Gastrointestinal: Negative for vomiting, abdominal pain and diarrhea.  Genitourinary: Negative for hesitancy, frequency, hematuria, discharge, penile  swelling, scrotal swelling and penile pain.  All other systems reviewed and are negative.      Allergies  Oxycodone and Penicillins  Home Medications   Current Outpatient Rx  Name  Route  Sig  Dispense  Refill  . cholecalciferol (VITAMIN D) 1000 UNITS tablet   Oral   Take 1,000 Units by mouth daily.         Marland Kitchen HYDROcodone-acetaminophen (NORCO/VICODIN) 5-325 MG per tablet   Oral   Take 1 tablet by mouth every 4 (four) hours as needed.   15 tablet   0   . ibuprofen (ADVIL,MOTRIN) 200 MG tablet   Oral   Take 800 mg by mouth every 6 (six) hours as needed for mild pain or moderate pain.         Marland Kitchen lisinopril-hydrochlorothiazide (PRINZIDE,ZESTORETIC) 20-25 MG per tablet   Oral   Take 1 tablet by mouth daily.          . metFORMIN (GLUCOPHAGE) 500 MG tablet   Oral   Take 500 mg by mouth 2 (two) times daily with a meal.         . vitamin C (ASCORBIC ACID) 500 MG tablet   Oral   Take 500 mg by mouth daily.          BP 138/93  Pulse 86  Temp(Src) 98.1 F (36.7 C) (Oral)  Resp 16  SpO2 97% Physical Exam  Nursing note and vitals reviewed. Constitutional: He is oriented to person, place, and time. He appears well-developed and well-nourished. No distress.  HENT:  Head: Normocephalic and atraumatic.  Mouth/Throat: No oropharyngeal exudate.  Eyes: EOM are normal. Pupils are equal, round, and reactive to light.  Neck: Normal range of motion. Neck supple.  Cardiovascular: Normal rate and regular rhythm.  Exam reveals no friction rub.   No murmur heard. Pulmonary/Chest: Effort normal and breath sounds normal. No respiratory distress. He has no wheezes. He has no rales.  Abdominal: He exhibits no distension. There is no tenderness. There is no rebound. A hernia is present. Hernia confirmed positive in the right inguinal area (small. R inguinal). Hernia confirmed negative in the left inguinal area.  Genitourinary: Right testis shows no mass, no swelling and no  tenderness. Left testis shows no mass, no swelling and no tenderness. No phimosis, hypospadias, penile erythema or penile tenderness.  Musculoskeletal: Normal range of motion. He exhibits no edema.  Lymphadenopathy:       Right: No inguinal adenopathy present.       Left: No inguinal adenopathy present.  Neurological: He is alert and oriented to person, place, and time. No cranial nerve deficit. He exhibits normal muscle tone.  Skin: No rash noted. He is not diaphoretic.    ED Course  Procedures (including critical care time) Labs Review Labs Reviewed  URINALYSIS, ROUTINE W REFLEX MICROSCOPIC   Imaging Review Ct Abdomen Pelvis W Contrast  03/06/2014   CLINICAL DATA:  Pelvic pain.  EXAM: CT ABDOMEN AND PELVIS WITH CONTRAST  TECHNIQUE: Multidetector CT imaging of the abdomen and pelvis was performed using the standard protocol following bolus administration of intravenous contrast.  CONTRAST:  50mL OMNIPAQUE IOHEXOL 300 MG/ML SOLN, OMNIPAQUE IOHEXOL 300 MG/ML SOLN  COMPARISON:  None.  FINDINGS: Fatty infiltration of the liver. Spleen normal. Splenosis. Pancreas normal. No biliary distention. Gallbladder nondistended. No pericholecystic fluid collections.  Adrenals normal. Nonobstructive right nephrolithiasis. Kidneys otherwise unremarkable. No hydronephrosis. No obstructing ureteral stone. An 11 mm enhancing nodular density is noted in the posterior right lower bladder wall, image number 83/series 2. This could represent a tiny bladder tumor. Cystoscopic evaluation suggested.  Shotty inguinal lymph nodes are present. Shotty retroperitoneal lymph nodes are present. Abdominal aorta is widely patent. Visceral vessels are patent. No aneurysm.  Appendix is normal. No inflammatory change in right or left lower quadrant. Diverticulosis. No evidence of diverticulitis. No evidence of bowel obstruction. Stomach is nondistended. No free air. Shotty mesenteric lymph nodes. Bilateral inguinal hernias with  herniation of fat only. Thoracolumbar degenerative change.  Lung bases clear. Heart size normal. Degenerative changes thoracolumbar spine, both SI joints , and both hips.  IMPRESSION: 1. 11 mm enhancing nodular density noted in the posterior right lower bladder wall. This could represent tiny bladder tumor. Cystoscopic evaluation suggested. 2. Diverticulosis, no diverticulitis. 3. Bilateral inguinal hernias with herniation of fat only. 4. Shotty inguinal, retroperitoneal, and mesenteric lymph nodes. The MRI   Electronically Signed   By: Maisie Fus  Register   On: 03/06/2014 14:52     EKG Interpretation None      MDM   Final diagnoses:  Inguinal hernia    89M presents with R groin pain. Hx of hernia repair there. Was driving to work and felt bulge and pain. No testicular or penile pain, all pain in R groin. On exam, vitals stable. Small bulge in R inguinal canal. uanble to reduce at bedside. Patient given dilaudid, placed in trendelenburg to aid in reduction, will re-attempt shortly. On re-attempt of hernia reduction, patient tolerated reduction. Small bowel loop felt to easily reduce. Patient  having continued pain there, will CT to look for further herniated contents. He is obese, making exam difficult. CT without evidence of herniated bowel. Given surgery f/u instructions. Stable for discharge.  Dagmar HaitWilliam Iniko Robles, MD 03/07/14 [phone]

## 2014-03-06 NOTE — ED Notes (Addendum)
Pt reports hx of right groin hernia and repiar 4 years ago, no issues until today. 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 Pt was driving to work when hernia popped out. Pain 9/10.

## 2014-03-06 NOTE — Discharge Instructions (Signed)

## 2014-03-06 NOTE — ED Notes (Signed)
Dr. Gwendolyn GrantWalden has just worked with pt. At length to reduce the hernia.  He has informed pt. And his significant other of plan to CT, with which they agree.  He remains in no distress.

## 2014-03-12 ENCOUNTER — Emergency Department: Payer: Self-pay | Admitting: Emergency Medicine

## 2014-03-12 LAB — COMPREHENSIVE METABOLIC PANEL
ANION GAP: 3 — AB (ref 7–16)
Albumin: 4 g/dL (ref 3.4–5.0)
Alkaline Phosphatase: 66 U/L
BUN: 18 mg/dL (ref 7–18)
Bilirubin,Total: 0.3 mg/dL (ref 0.2–1.0)
Calcium, Total: 9.3 mg/dL (ref 8.5–10.1)
Chloride: 100 mmol/L (ref 98–107)
Co2: 31 mmol/L (ref 21–32)
Creatinine: 1.13 mg/dL (ref 0.60–1.30)
EGFR (African American): >60
EGFR (Non-African Amer.): >60
Glucose: 222 mg/dL — ABNORMAL HIGH (ref 65–99)
Osmolality: 277 (ref 275–301)
Potassium: 4.4 mmol/L (ref 3.5–5.1)
SGOT(AST): 29 U/L (ref 15–37)
SGPT (ALT): 53 U/L (ref 12–78)
SODIUM: 134 mmol/L — AB (ref 136–145)
Total Protein: 8 g/dL (ref 6.4–8.2)

## 2014-03-12 LAB — URINALYSIS, COMPLETE
BLOOD: NEGATIVE
Bacteria: NONE SEEN
Bilirubin,UR: NEGATIVE
Glucose,UR: >=500 mg/dL (ref 0–75)
Leukocyte Esterase: NEGATIVE
NITRITE: NEGATIVE
PH: 5 (ref 4.5–8.0)
Protein: 30
RBC,UR: NONE SEEN /HPF (ref 0–5)
Specific Gravity: 1.024 (ref 1.003–1.030)
Squamous Epithelial: NONE SEEN
WBC UR: 1 /HPF (ref 0–5)

## 2014-03-12 LAB — CBC WITH DIFFERENTIAL/PLATELET
Basophil #: 0.2 10*3/uL — ABNORMAL HIGH (ref 0.0–0.1)
Basophil %: 1.8 %
Eosinophil #: 0.4 10*3/uL (ref 0.0–0.7)
Eosinophil %: 2.7 %
HCT: 47.1 % (ref 40.0–52.0)
HGB: 16.1 g/dL (ref 13.0–18.0)
Lymphocyte #: 4.1 10*3/uL — ABNORMAL HIGH (ref 1.0–3.6)
Lymphocyte %: 29.4 %
MCH: 31 pg (ref 26.0–34.0)
MCHC: 34.1 g/dL (ref 32.0–36.0)
MCV: 91 fL (ref 80–100)
MONOS PCT: 6.5 %
Monocyte #: 0.9 x10 3/mm (ref 0.2–1.0)
NEUTROS PCT: 59.6 %
Neutrophil #: 8.3 10*3/uL — ABNORMAL HIGH (ref 1.4–6.5)
Platelet: 253 10*3/uL (ref 150–440)
RBC: 5.18 10*6/uL (ref 4.40–5.90)
RDW: 13.4 % (ref 11.5–14.5)
WBC: 13.9 10*3/uL — AB (ref 3.8–10.6)

## 2014-03-13 ENCOUNTER — Encounter (HOSPITAL_COMMUNITY): Payer: Self-pay | Admitting: Emergency Medicine

## 2014-03-13 ENCOUNTER — Emergency Department (HOSPITAL_COMMUNITY)
Admission: EM | Admit: 2014-03-13 | Discharge: 2014-03-13 | Disposition: A | Discharge status: Home / Self Care | Payer: BC Managed Care – PPO | Attending: Emergency Medicine | Admitting: Emergency Medicine

## 2014-03-13 DIAGNOSIS — F17200 Nicotine dependence, unspecified, uncomplicated: Secondary | ICD-10-CM | POA: Insufficient documentation

## 2014-03-13 DIAGNOSIS — R109 Unspecified abdominal pain: Secondary | ICD-10-CM | POA: Insufficient documentation

## 2014-03-13 DIAGNOSIS — Z880 Allergy status to penicillin: Secondary | ICD-10-CM | POA: Insufficient documentation

## 2014-03-13 DIAGNOSIS — Z8781 Personal history of (healed) traumatic fracture: Secondary | ICD-10-CM | POA: Insufficient documentation

## 2014-03-13 DIAGNOSIS — Z79899 Other long term (current) drug therapy: Secondary | ICD-10-CM | POA: Insufficient documentation

## 2014-03-13 DIAGNOSIS — R1031 Right lower quadrant pain: Secondary | ICD-10-CM

## 2014-03-13 DIAGNOSIS — I10 Essential (primary) hypertension: Secondary | ICD-10-CM | POA: Insufficient documentation

## 2014-03-13 DIAGNOSIS — E119 Type 2 diabetes mellitus without complications: Secondary | ICD-10-CM | POA: Insufficient documentation

## 2014-03-13 MED ORDER — HYDROCODONE-ACETAMINOPHEN 5-325 MG PO TABS: 1.0000 | ORAL_TABLET | ORAL | Status: DC | PRN

## 2014-03-13 MED ORDER — HYDROCODONE-ACETAMINOPHEN 5-325 MG PO TABS
2.0000 | ORAL_TABLET | Freq: Once | ORAL | Status: AC
  Administered 2014-03-13: 2 via ORAL
  Filled 2014-03-13: qty 2

## 2014-03-13 NOTE — ED Notes (Signed)
MD at bedside. 

## 2014-03-13 NOTE — ED Provider Notes (Signed)
CSN: [id number]     Arrival date & time 03/13/14  0910 History   First MD Initiated Contact with Patient 03/13/14 0930     Chief Complaint  Patient presents with  . Hernia      HPI Patient reports inguinal hernia repair 3 years ago.  States ongoing right inguinal pain over the past several days since he ran out of the Vicodin.  He states he usually has some degree of pain in the right inguinal region.  He was seen in the emergency department approximately one week ago with A. Boles noted in his right inguinal area that was reduced.  CT scan performed at that time demonstrated bilateral fat containing inguinal hernias without bowel.  He states that he's had rather ongoing pain in that right inguinal region since reduction.  His pain was treated with Vicodin and was controlling his pain but he ran out of his Vicodin several days ago and now returns with ongoing pain in that right inguinal region.  He has an appointment scheduled for April 13 with general surgery.  He denies nausea and vomiting.  No abdominal distention.  He denies constipation.   Past Medical History  Diagnosis Date  . Hypertension   . Humerus fracture     right  . Diabetes mellitus    Past Surgical History  Procedure Laterality Date  . Hernia repair     Family History  Problem Relation Age of Onset  . Diabetes Mother   . Hypertension Mother   . Diabetes Father   . Hypertension Father    History  Substance Use Topics  . Smoking status: Current Every Day Smoker    Types: Cigarettes  . Smokeless tobacco: Never Used  . Alcohol Use: Yes    Review of Systems  All other systems reviewed and are negative.      Allergies  Oxycodone and Penicillins  Home Medications   Current Outpatient Rx  Name  Route  Sig  Dispense  Refill  . cholecalciferol (VITAMIN D) 1000 UNITS tablet   Oral   Take 1,000 Units by mouth daily.         Marland Kitchen HYDROcodone-acetaminophen (NORCO/VICODIN) 5-325 MG per tablet   Oral   Take 1  tablet by mouth every 4 (four) hours as needed.   15 tablet   0   . HYDROcodone-acetaminophen (NORCO/VICODIN) 5-325 MG per tablet   Oral   Take 1 tablet by mouth every 6 (six) hours as needed for moderate pain.   20 tablet   0   . HYDROcodone-acetaminophen (NORCO/VICODIN) 5-325 MG per tablet   Oral   Take 1 tablet by mouth every 4 (four) hours as needed for moderate pain.   30 tablet   0   . ibuprofen (ADVIL,MOTRIN) 200 MG tablet   Oral   Take 800 mg by mouth every 6 (six) hours as needed for mild pain or moderate pain.         Marland Kitchen lisinopril-hydrochlorothiazide (PRINZIDE,ZESTORETIC) 20-25 MG per tablet   Oral   Take 1 tablet by mouth daily.          . metFORMIN (GLUCOPHAGE) 500 MG tablet   Oral   Take 500 mg by mouth 2 (two) times daily with a meal.         . vitamin C (ASCORBIC ACID) 500 MG tablet   Oral   Take 500 mg by mouth daily.          BP 160/99  Pulse 91  Temp(Src) 98 F (36.7 C) (Oral)  Resp 20  SpO2 98% Physical Exam  Nursing note and vitals reviewed. Constitutional: He is oriented to person, place, and time. He appears well-developed and well-nourished.  HENT:  Head: Normocephalic and atraumatic.  Eyes: EOM are normal.  Neck: Normal range of motion.  Cardiovascular: Normal rate, regular rhythm, normal heart sounds and intact distal pulses.   Pulmonary/Chest: Effort normal and breath sounds normal. No respiratory distress.  Abdominal: Soft. He exhibits no distension. There is no tenderness.  Discomfort in tenderness in the right inguinal region.  No overlying skin changes.  No obvious palpable right inguinal hernia felt.  Uncircumcised penis.  Scrotum normal.  Testicles normal bilaterally.  Some tenderness with palpation within the right inguinal region itself.  Musculoskeletal: Normal range of motion.  Neurological: He is alert and oriented to person, place, and time.  Skin: Skin is warm and dry.  Psychiatric: He has a normal mood and affect.  Judgment normal.    ED Course  Procedures (including critical care time) Labs Review Labs Reviewed - No data to display Imaging Review No results found.   EKG Interpretation None      MDM   Final diagnoses:  Right inguinal pain     no obvious right inguinal hernia this time.  CT scan from last hospitalization were reviewed demonstrating bilateral fat containing right inguinal hernias only.  Unclear etiology of this patient's pain.  This may be related to intermittent right inguinal hernia versus ongoing incarcerated right inguinal hernia fat.  No indication for repeat imaging.  Vital signs stable.  Afebrile.  No signs of infection.  Abdomen is nondistended.  Home with pain medication and general surgery followup.    Lyanne CoKevin M Padraig Nhan, MD 03/13/14 1006

## 2014-03-13 NOTE — ED Notes (Signed)
Pt states he had hernia repair approx 3 years ago.  Pt states every once in a while, hernia pops out.  Has been able to reduce in past.  Pt states he has appt on April 13th.

## 2014-03-21 ENCOUNTER — Emergency Department (HOSPITAL_COMMUNITY)
Admission: EM | Admit: 2014-03-21 | Discharge: 2014-03-21 | Disposition: A | Discharge status: Home / Self Care | Payer: BC Managed Care – PPO | Attending: Emergency Medicine | Admitting: Emergency Medicine

## 2014-03-21 ENCOUNTER — Encounter (HOSPITAL_COMMUNITY): Payer: Self-pay | Admitting: Emergency Medicine

## 2014-03-21 DIAGNOSIS — Z8719 Personal history of other diseases of the digestive system: Secondary | ICD-10-CM | POA: Insufficient documentation

## 2014-03-21 DIAGNOSIS — R109 Unspecified abdominal pain: Secondary | ICD-10-CM | POA: Insufficient documentation

## 2014-03-21 DIAGNOSIS — Z880 Allergy status to penicillin: Secondary | ICD-10-CM | POA: Insufficient documentation

## 2014-03-21 DIAGNOSIS — I10 Essential (primary) hypertension: Secondary | ICD-10-CM | POA: Insufficient documentation

## 2014-03-21 DIAGNOSIS — F17200 Nicotine dependence, unspecified, uncomplicated: Secondary | ICD-10-CM | POA: Insufficient documentation

## 2014-03-21 DIAGNOSIS — R1031 Right lower quadrant pain: Secondary | ICD-10-CM

## 2014-03-21 DIAGNOSIS — E119 Type 2 diabetes mellitus without complications: Secondary | ICD-10-CM | POA: Insufficient documentation

## 2014-03-21 DIAGNOSIS — Z79899 Other long term (current) drug therapy: Secondary | ICD-10-CM | POA: Insufficient documentation

## 2014-03-21 DIAGNOSIS — Z8781 Personal history of (healed) traumatic fracture: Secondary | ICD-10-CM | POA: Insufficient documentation

## 2014-03-21 HISTORY — DX: Unilateral inguinal hernia, without obstruction or gangrene, not specified as recurrent: K40.90

## 2014-03-21 LAB — BASIC METABOLIC PANEL
BUN: 12 mg/dL (ref 6–23)
CHLORIDE: 96 meq/L (ref 96–112)
CO2: 21 meq/L (ref 19–32)
Calcium: 10.1 mg/dL (ref 8.4–10.5)
Creatinine, Ser: 0.79 mg/dL (ref 0.50–1.35)
GFR calc Af Amer: >90 mL/min (ref >90)
GFR calc non Af Amer: >90 mL/min (ref >90)
Glucose, Bld: 181 mg/dL — ABNORMAL HIGH (ref 70–99)
Potassium: 4.9 mEq/L (ref 3.7–5.3)
SODIUM: 134 meq/L — AB (ref 137–147)

## 2014-03-21 LAB — CBC WITH DIFFERENTIAL/PLATELET
Basophils Absolute: 0.1 10*3/uL (ref 0.0–0.1)
Basophils Relative: 1 % (ref 0–1)
Eosinophils Absolute: 0.3 10*3/uL (ref 0.0–0.7)
Eosinophils Relative: 2 % (ref 0–5)
HCT: 47.7 % (ref 39.0–52.0)
Hemoglobin: 17.6 g/dL — ABNORMAL HIGH (ref 13.0–17.0)
LYMPHS ABS: 3 10*3/uL (ref 0.7–4.0)
LYMPHS PCT: 21 % (ref 12–46)
MCH: 32.3 pg (ref 26.0–34.0)
MCHC: 36.9 g/dL — ABNORMAL HIGH (ref 30.0–36.0)
MCV: 87.5 fL (ref 78.0–100.0)
MONO ABS: 0.9 10*3/uL (ref 0.1–1.0)
Monocytes Relative: 6 % (ref 3–12)
Neutro Abs: 9.8 10*3/uL — ABNORMAL HIGH (ref 1.7–7.7)
Neutrophils Relative %: 70 % (ref 43–77)
PLATELETS: 272 10*3/uL (ref 150–400)
RBC: 5.45 MIL/uL (ref 4.22–5.81)
RDW: 13.2 % (ref 11.5–15.5)
WBC: 14 10*3/uL — AB (ref 4.0–10.5)

## 2014-03-21 LAB — LACTIC ACID, PLASMA: LACTIC ACID, VENOUS: 1.8 mmol/L (ref 0.5–2.2)

## 2014-03-21 MED ORDER — OXYCODONE-ACETAMINOPHEN 5-325 MG PO TABS
2.0000 | ORAL_TABLET | Freq: Once | ORAL | Status: AC
  Administered 2014-03-21: 2 via ORAL
  Filled 2014-03-21: qty 2

## 2014-03-21 MED ORDER — OXYCODONE-ACETAMINOPHEN 5-325 MG PO TABS: 1.0000 | ORAL_TABLET | Freq: Four times a day (QID) | ORAL | Status: DC | PRN

## 2014-03-21 NOTE — ED Notes (Addendum)
Pt c/o inguinal hernia pain and sts "I think, it popped again."  Pain score 10/10.  Pt has been seen several times for same and has an appointment w/ a surgeon on 4/13.  Pt has previously had hernia reduced in ED.

## 2014-03-21 NOTE — ED Provider Notes (Signed)
CSN: [id number]     Arrival date & time 03/21/14  1202 History   First MD Initiated Contact with Patient 03/21/14 1245     Chief Complaint  Patient presents with  . Inguinal Hernia     (Consider location/radiation/quality/duration/timing/severity/associated sxs/prior Treatment) Patient is a 53 y.o. male presenting with abdominal pain. The history is provided by the patient.  Abdominal Pain Pain location: right inguinal area. Pain quality: aching   Pain radiates to:  Does not radiate Pain severity:  Moderate Onset quality:  Gradual Timing:  Constant Progression:  Worsening Chronicity:  Chronic Context comment:  At rest Relieved by:  Nothing Worsened by:  Nothing tried Ineffective treatments:  None tried Associated symptoms: no chest pain, no cough, no diarrhea, no dysuria, no fever, no hematuria, no nausea, no shortness of breath and no vomiting     Past Medical History  Diagnosis Date  . Hypertension   . Humerus fracture     right  . Diabetes mellitus   . Inguinal hernia    Past Surgical History  Procedure Laterality Date  . Hernia repair     Family History  Problem Relation Age of Onset  . Diabetes Mother   . Hypertension Mother   . Diabetes Father   . Hypertension Father    History  Substance Use Topics  . Smoking status: Current Every Day Smoker    Types: Cigarettes  . Smokeless tobacco: Never Used  . Alcohol Use: Yes    Review of Systems  Constitutional: Negative for fever.  HENT: Negative for drooling and rhinorrhea.   Eyes: Negative for pain.  Respiratory: Negative for cough and shortness of breath.   Cardiovascular: Negative for chest pain and leg swelling.  Gastrointestinal: Negative for nausea, vomiting, abdominal pain and diarrhea.  Genitourinary: Negative for dysuria and hematuria.  Musculoskeletal: Negative for gait problem and neck pain.  Skin: Negative for color change.  Neurological: Negative for numbness and headaches.  Hematological:  Negative for adenopathy.  Psychiatric/Behavioral: Negative for behavioral problems.  All other systems reviewed and are negative.     Allergies  Oxycodone and Penicillins  Home Medications   Current Outpatient Rx  Name  Route  Sig  Dispense  Refill  . cholecalciferol (VITAMIN D) 1000 UNITS tablet   Oral   Take 1,000 Units by mouth daily.         Marland Kitchen HYDROcodone-acetaminophen (NORCO/VICODIN) 5-325 MG per tablet   Oral   Take 1 tablet by mouth every 4 (four) hours as needed for moderate pain.   30 tablet   0   . ibuprofen (ADVIL,MOTRIN) 200 MG tablet   Oral   Take 800 mg by mouth every 6 (six) hours as needed for mild pain or moderate pain.         Marland Kitchen lisinopril-hydrochlorothiazide (PRINZIDE,ZESTORETIC) 20-25 MG per tablet   Oral   Take 1 tablet by mouth daily.          . magnesium oxide (MAG-OX) 400 MG tablet   Oral   Take 4,800 mg by mouth at bedtime.         . metFORMIN (GLUCOPHAGE) 500 MG tablet   Oral   Take 500 mg by mouth daily.          Marland Kitchen PASSION FLOWER-VALERIAN PO   Oral   Take 1 capsule by mouth 2 (two) times daily.         . vitamin C (ASCORBIC ACID) 500 MG tablet   Oral   Take 500  mg by mouth daily.          BP 152/97  Pulse 89  Temp(Src) 98.1 F (36.7 C) (Oral)  Resp 20  SpO2 98% Physical Exam  Nursing note and vitals reviewed. Constitutional: He is oriented to person, place, and time. He appears well-developed and well-nourished.  HENT:  Head: Normocephalic and atraumatic.  Right Ear: External ear normal.  Left Ear: External ear normal.  Nose: Nose normal.  Mouth/Throat: Oropharynx is clear and moist. No oropharyngeal exudate.  Eyes: Conjunctivae and EOM are normal. Pupils are equal, round, and reactive to light.  Neck: Normal range of motion. Neck supple.  Cardiovascular: Normal rate, regular rhythm, normal heart sounds and intact distal pulses.  Exam reveals no gallop and no friction rub.   No murmur heard. Pulmonary/Chest:  Effort normal and breath sounds normal. No respiratory distress. He has no wheezes.  Abdominal: Soft. Bowel sounds are normal. He exhibits no distension. There is no tenderness. There is no rebound and no guarding.  Genitourinary:  Prominent subcutaneous fatty tissue in the suprapubic area which does limit the exam. No obvious inguinal hernia felt bilaterally. Normal appearing testes w/out ttp, normal lie of testes. Normal appearing penis.  Musculoskeletal: Normal range of motion. He exhibits no edema and no tenderness.  Neurological: He is alert and oriented to person, place, and time.  Skin: Skin is warm and dry.  Psychiatric: He has a normal mood and affect. His behavior is normal.    ED Course  Procedures (including critical care time) Labs Review Labs Reviewed  CBC WITH DIFFERENTIAL - Abnormal; Notable for the following:    WBC 14.0 (*)    Hemoglobin 17.6 (*)    MCHC 36.9 (*)    Neutro Abs 9.8 (*)    All other components within normal limits  BASIC METABOLIC PANEL - Abnormal; Notable for the following:    Sodium 134 (*)    Glucose, Bld 181 (*)    All other components within normal limits  LACTIC ACID, PLASMA   Imaging Review No results found.   EKG Interpretation None      MDM   Final diagnoses:  Right inguinal pain    2:34 PM 53 y.o. male with a history of right inguinal hernia repair 3 years ago who presents with right groin pain. He is been seen here twice previously for the same pain. 2 visits ago he was seen and had a CT scan of abdomen showing bilateral fat containing inguinal hernias. On his most recent visit he was seen and evaluated and no hernia was felt. I agree with the most recent exam and I do not feel an inguinal hernia on my exam. He states that his pain is constant but has worsened in the last 24 to 48 hrs. This may be related to intermittent right inguinal hernia versus ongoing incarcerated right inguinal hernia fat as noted by the ED provider during his  most recent visit. He has f/u w/ [facility] in 4 days. Will get pain control and screening lab work.   2:40 PM: Mildly elev wbc which is non-specific. Neg lactate. Doubt incarcerated/strangulated hernia. Pt having normal bm's, last was this morning. Pain controlled here w/ percocet.  I have discussed the diagnosis/risks/treatment options with the patient and family and believe the pt to be eligible for discharge home to follow-up with [facility] as scheduled in 4 days. We also discussed returning to the ED immediately if new or worsening sx occur. We discussed the sx which  are most concerning (e.g., worsening pain) that necessitate immediate return. Medications administered to the patient during their visit and any new prescriptions provided to the patient are listed below.  Medications given during this visit Medications  oxyCODONE-acetaminophen (PERCOCET/ROXICET) 5-325 MG per tablet 2 tablet (2 tablets Oral Given 03/21/14 1320)    New Prescriptions   OXYCODONE-ACETAMINOPHEN (PERCOCET) 5-325 MG PER TABLET    Take 1-2 tablets by mouth every 6 (six) hours as needed for moderate pain.        Junius ArgyleForrest S Tanaisha Pittman, MD 03/21/14 1501

## 2014-03-21 NOTE — ED Notes (Signed)
Pt reports had hernia reduced less than 2 weeks ago. Pt reports soonest possible appointment he could get with surgeon is 4/13. Pt standing to relieve "sharp pressure" to right groin.

## 2014-03-25 ENCOUNTER — Ambulatory Visit (INDEPENDENT_AMBULATORY_CARE_PROVIDER_SITE_OTHER): Payer: BC Managed Care – PPO | Admitting: General Surgery

## 2014-04-04 ENCOUNTER — Encounter (HOSPITAL_COMMUNITY): Payer: Self-pay | Admitting: Emergency Medicine

## 2014-04-04 ENCOUNTER — Emergency Department (HOSPITAL_COMMUNITY)
Admission: EM | Admit: 2014-04-04 | Discharge: 2014-04-04 | Disposition: A | Discharge status: Home / Self Care | Payer: BC Managed Care – PPO | Attending: Emergency Medicine | Admitting: Emergency Medicine

## 2014-04-04 ENCOUNTER — Ambulatory Visit (INDEPENDENT_AMBULATORY_CARE_PROVIDER_SITE_OTHER): Payer: BC Managed Care – PPO | Admitting: Surgery

## 2014-04-04 DIAGNOSIS — Z8719 Personal history of other diseases of the digestive system: Secondary | ICD-10-CM | POA: Insufficient documentation

## 2014-04-04 DIAGNOSIS — F17200 Nicotine dependence, unspecified, uncomplicated: Secondary | ICD-10-CM | POA: Insufficient documentation

## 2014-04-04 DIAGNOSIS — Z8781 Personal history of (healed) traumatic fracture: Secondary | ICD-10-CM | POA: Insufficient documentation

## 2014-04-04 DIAGNOSIS — I10 Essential (primary) hypertension: Secondary | ICD-10-CM | POA: Insufficient documentation

## 2014-04-04 DIAGNOSIS — R1031 Right lower quadrant pain: Secondary | ICD-10-CM

## 2014-04-04 DIAGNOSIS — R110 Nausea: Secondary | ICD-10-CM | POA: Insufficient documentation

## 2014-04-04 DIAGNOSIS — E119 Type 2 diabetes mellitus without complications: Secondary | ICD-10-CM | POA: Insufficient documentation

## 2014-04-04 DIAGNOSIS — Z880 Allergy status to penicillin: Secondary | ICD-10-CM | POA: Insufficient documentation

## 2014-04-04 DIAGNOSIS — Z79899 Other long term (current) drug therapy: Secondary | ICD-10-CM | POA: Insufficient documentation

## 2014-04-04 DIAGNOSIS — R109 Unspecified abdominal pain: Secondary | ICD-10-CM | POA: Insufficient documentation

## 2014-04-04 LAB — COMPREHENSIVE METABOLIC PANEL
ALK PHOS: 58 U/L (ref 39–117)
ALT: 55 U/L — AB (ref 0–53)
AST: 34 U/L (ref 0–37)
Albumin: 4.1 g/dL (ref 3.5–5.2)
BILIRUBIN TOTAL: 0.3 mg/dL (ref 0.3–1.2)
BUN: 11 mg/dL (ref 6–23)
CHLORIDE: 99 meq/L (ref 96–112)
CO2: 25 meq/L (ref 19–32)
Calcium: 10.4 mg/dL (ref 8.4–10.5)
Creatinine, Ser: 0.78 mg/dL (ref 0.50–1.35)
Glucose, Bld: 161 mg/dL — ABNORMAL HIGH (ref 70–99)
Potassium: 4.2 mEq/L (ref 3.7–5.3)
SODIUM: 137 meq/L (ref 137–147)
Total Protein: 7.7 g/dL (ref 6.0–8.3)

## 2014-04-04 LAB — CBC WITH DIFFERENTIAL/PLATELET
BASOS ABS: 0.1 10*3/uL (ref 0.0–0.1)
Basophils Relative: 1 % (ref 0–1)
Eosinophils Absolute: 0.3 10*3/uL (ref 0.0–0.7)
Eosinophils Relative: 2 % (ref 0–5)
HCT: 45.7 % (ref 39.0–52.0)
Hemoglobin: 16.5 g/dL (ref 13.0–17.0)
LYMPHS PCT: 24 % (ref 12–46)
Lymphs Abs: 3.3 10*3/uL (ref 0.7–4.0)
MCH: 31.4 pg (ref 26.0–34.0)
MCHC: 36.1 g/dL — AB (ref 30.0–36.0)
MCV: 86.9 fL (ref 78.0–100.0)
MONO ABS: 1 10*3/uL (ref 0.1–1.0)
Monocytes Relative: 7 % (ref 3–12)
NEUTROS PCT: 66 % (ref 43–77)
Neutro Abs: 8.9 10*3/uL — ABNORMAL HIGH (ref 1.7–7.7)
PLATELETS: 299 10*3/uL (ref 150–400)
RBC: 5.26 MIL/uL (ref 4.22–5.81)
RDW: 12.6 % (ref 11.5–15.5)
WBC: 13.6 10*3/uL — ABNORMAL HIGH (ref 4.0–10.5)

## 2014-04-04 LAB — LIPASE, BLOOD: LIPASE: 38 U/L (ref 11–59)

## 2014-04-04 MED ORDER — ONDANSETRON 8 MG PO TBDP
8.0000 mg | ORAL_TABLET | Freq: Once | ORAL | Status: AC
  Administered 2014-04-04: 8 mg via ORAL
  Filled 2014-04-04: qty 1

## 2014-04-04 MED ORDER — OXYCODONE-ACETAMINOPHEN 5-325 MG PO TABS: 1.0000 | ORAL_TABLET | Freq: Four times a day (QID) | ORAL | Status: DC | PRN

## 2014-04-04 MED ORDER — OXYCODONE-ACETAMINOPHEN 5-325 MG PO TABS
1.0000 | ORAL_TABLET | Freq: Once | ORAL | Status: AC
  Administered 2014-04-04: 1 via ORAL
  Filled 2014-04-04: qty 1

## 2014-04-04 NOTE — ED Provider Notes (Signed)
CSN: [id number]     Arrival date & time 04/04/14  1430 History   First MD Initiated Contact with Patient 04/04/14 1634     Chief Complaint  Patient presents with  . Inguinal Hernia  . Nausea     (Consider location/radiation/quality/duration/timing/severity/associated sxs/prior Treatment) HPI Comments: Patient with h/o R inguinal hernia repair several years ago by Dr. Ezzard StandingNewman -- presents with worsening of his intermittent right inguinal pain. Patient states that his pain has been poorly controlled since yesterday. He was due to followup with his surgeon today, however his appointment was canceled and rescheduled for next Tuesday (5 days from now). Pain is sharp at times. It has been associated with nausea but no vomiting. Patient is having normal bowel movements and is passing gas. He has not had any fevers. Patient has been taking Percocet at home with improvement, however ran out last night. Patient is uncertain if the hernia is currently in or out. Onset of symptoms acute. Course is intermittent.   The history is provided by the patient and medical records.    Past Medical History  Diagnosis Date  . Hypertension   . Humerus fracture     right  . Diabetes mellitus   . Inguinal hernia    Past Surgical History  Procedure Laterality Date  . Hernia repair     Family History  Problem Relation Age of Onset  . Diabetes Mother   . Hypertension Mother   . Diabetes Father   . Hypertension Father    History  Substance Use Topics  . Smoking status: Current Every Day Smoker    Types: Cigarettes  . Smokeless tobacco: Never Used  . Alcohol Use: Yes    Review of Systems  Constitutional: Negative for fever.  HENT: Negative for rhinorrhea and sore throat.   Eyes: Negative for redness.  Respiratory: Negative for cough.   Cardiovascular: Negative for chest pain.  Gastrointestinal: Positive for nausea. Negative for vomiting, abdominal pain and diarrhea.  Genitourinary: Negative for  dysuria, penile swelling and penile pain.       + inguinal pain  Musculoskeletal: Negative for myalgias.  Skin: Negative for rash.  Neurological: Negative for headaches.      Allergies  Oxycodone and Penicillins  Home Medications   Prior to Admission medications   Medication Sig Start Date End Date Taking? Authorizing Provider  cholecalciferol (VITAMIN D) 1000 UNITS tablet Take 1,000 Units by mouth daily.   Yes Historical Provider, MD  ibuprofen (ADVIL,MOTRIN) 200 MG tablet Take 800 mg by mouth every 6 (six) hours as needed for mild pain or moderate pain.   Yes Historical Provider, MD  lisinopril-hydrochlorothiazide (PRINZIDE,ZESTORETIC) 20-25 MG per tablet Take 1 tablet by mouth daily.    Yes Historical Provider, MD  magnesium oxide (MAG-OX) 400 MG tablet Take 800 mg by mouth at bedtime.    Yes Historical Provider, MD  metFORMIN (GLUCOPHAGE) 500 MG tablet Take 500 mg by mouth daily.    Yes Historical Provider, MD  PASSION FLOWER-VALERIAN PO Take 1 capsule by mouth 2 (two) times daily.   Yes Historical Provider, MD  vitamin C (ASCORBIC ACID) 500 MG tablet Take 500 mg by mouth daily.   Yes Historical Provider, MD  oxyCODONE-acetaminophen (PERCOCET/ROXICET) 5-325 MG per tablet Take 1-2 tablets by mouth every 6 (six) hours as needed for severe pain. 04/04/14   Renne CriglerJoshua Ramirez Fullbright, PA-C   BP 160/103  Pulse 83  Temp(Src) 97.9 F (36.6 C) (Oral)  Resp 20  SpO2 96% Physical Exam  Nursing note and vitals reviewed. Constitutional: He appears well-developed and well-nourished.  HENT:  Head: Normocephalic and atraumatic.  Eyes: Conjunctivae are normal. Right eye exhibits no discharge. Left eye exhibits no discharge.  Neck: Normal range of motion. Neck supple.  Cardiovascular: Normal rate, regular rhythm and normal heart sounds.   Pulmonary/Chest: Effort normal and breath sounds normal.  Abdominal: Soft. There is no tenderness. Hernia confirmed negative in the right inguinal area and confirmed  negative in the left inguinal area.  Genitourinary: Testes normal and penis normal.    Right testis shows no swelling and no tenderness. Left testis shows no swelling and no tenderness. Uncircumcised.  Lymphadenopathy:       Right: No inguinal adenopathy present.       Left: No inguinal adenopathy present.  Neurological: He is alert.  Skin: Skin is warm and dry.  Psychiatric: He has a normal mood and affect.    ED Course  Procedures (including critical care time) Labs Review Labs Reviewed  COMPREHENSIVE METABOLIC PANEL - Abnormal; Notable for the following:    Glucose, Bld 161 (*)    ALT 55 (*)    All other components within normal limits  CBC WITH DIFFERENTIAL - Abnormal; Notable for the following:    WBC 13.6 (*)    MCHC 36.1 (*)    Neutro Abs 8.9 (*)    All other components within normal limits  LIPASE, BLOOD    Imaging Review No results found.   EKG Interpretation None      5:13 PM Patient seen and examined. No hernia felt. Pain is improved. Patient urged to followup with his surgeon next week as planned. Discussed signs of incarceration and strangulation and need to return if these occur. Patient and wife verbalized understanding.   CT from 03/25 reviewed: Bilateral inguinal hernias with herniation of fat only.  Vital signs reviewed and are as follows: Filed Vitals:   04/04/14 1512  BP: 160/103  Pulse: 83  Temp: 97.9 F (36.6 C)  Resp: 20   Patient counseled on use of narcotic pain medications. Counseled not to combine these medications with others containing tylenol. Urged not to drink alcohol, drive, or perform any other activities that requires focus while taking these medications. The patient verbalizes understanding and agrees with the plan.    MDM   Final diagnoses:  Right inguinal pain   Patient with history of hernia in this area. No discrete hernia felt on exam today. Do not suspect strangulated hernia or incarcerated hernia. No signs of  cellulitis. Testicles nontender. Blood work is reassuring. Elevated WBC unchanged. No fever, tachycardia.   Renne CriglerJoshua Surina Storts, PA-C 04/04/14 1725

## 2014-04-04 NOTE — ED Provider Notes (Signed)
Medical screening examination/treatment/procedure(s) were performed by non-physician practitioner and as supervising physician I was immediately available for consultation/collaboration.   EKG Interpretation None        Grahm Etsitty T Kristyn Obyrne, MD 04/04/14 2351 

## 2014-04-04 NOTE — ED Notes (Signed)
Patient aware of his allergy and is only allergic to oxycotin not oxycodone. Will monitor patient.

## 2014-04-04 NOTE — Discharge Instructions (Signed)
Please read and follow all provided instructions.  Your diagnoses today include:  1. Right inguinal pain     Tests performed today include:  Blood counts and electrolytes  Blood tests to check liver and kidney function  Blood tests to check pancreas function  Urine test to look for infection  Vital signs. See below for your results today.   Medications prescribed:   Percocet (oxycodone/acetaminophen) - narcotic pain medication  DO NOT drive or perform any activities that require you to be awake and alert because this medicine can make you drowsy. BE VERY CAREFUL not to take multiple medicines containing Tylenol (also called acetaminophen). Doing so can lead to an overdose which can damage your liver and cause liver failure and possibly death.  Take any prescribed medications only as directed.  Home care instructions:   Follow any educational materials contained in this packet.  Follow-up instructions: Please follow-up with your primary care provider in the next 2 days for further evaluation of your symptoms. If you do not have a primary care doctor -- see below for referral information.   Return instructions:  SEEK IMMEDIATE MEDICAL ATTENTION IF:  The pain does not go away or becomes severe   A temperature above 101F develops   Repeated vomiting occurs (multiple episodes)   The pain becomes localized to portions of the abdomen. The right side could possibly be appendicitis. In an adult, the left lower portion of the abdomen could be colitis or diverticulitis.   Blood is being passed in stools or vomit (bright red or black tarry stools)   You develop chest pain, difficulty breathing, dizziness or fainting, or become confused, poorly responsive, or inconsolable (young children)  If you have any other emergent concerns regarding your health  Additional Information: Abdominal (belly) pain can be caused by many things. Your caregiver performed an examination and possibly  ordered blood/urine tests and imaging (CT scan, x-rays, ultrasound). Many cases can be observed and treated at home after initial evaluation in the emergency department. Even though you are being discharged home, abdominal pain can be unpredictable. Therefore, you need a repeated exam if your pain does not resolve, returns, or worsens. Most patients with abdominal pain don't have to be admitted to the hospital or have surgery, but serious problems like appendicitis and gallbladder attacks can start out as nonspecific pain. Many abdominal conditions cannot be diagnosed in one visit, so follow-up evaluations are very important.  Your vital signs today were: BP 160/103   Pulse 83   Temp(Src) 97.9 F (36.6 C) (Oral)   Resp 20   SpO2 96% If your blood pressure (bp) was elevated above 135/85 this visit, please have this repeated by your doctor within one month. --------------

## 2014-04-04 NOTE — ED Notes (Signed)
Per patient- had appt with Dr.  Ezzard StandingNewman today at 2pm. [facility] Having nausea. Denies vomiting.

## 2014-04-10 ENCOUNTER — Encounter (INDEPENDENT_AMBULATORY_CARE_PROVIDER_SITE_OTHER): Payer: Self-pay | Admitting: Surgery

## 2014-04-10 ENCOUNTER — Ambulatory Visit (INDEPENDENT_AMBULATORY_CARE_PROVIDER_SITE_OTHER): Payer: BC Managed Care – PPO | Admitting: Surgery

## 2014-04-10 ENCOUNTER — Other Ambulatory Visit (INDEPENDENT_AMBULATORY_CARE_PROVIDER_SITE_OTHER): Payer: Self-pay | Admitting: Surgery

## 2014-04-10 VITALS — BP 150/92 | HR 78 | Temp 97.5°F | Ht 67.0 in | Wt 218.0 lb

## 2014-04-10 DIAGNOSIS — K4090 Unilateral inguinal hernia, without obstruction or gangrene, not specified as recurrent: Secondary | ICD-10-CM | POA: Insufficient documentation

## 2014-04-10 DIAGNOSIS — R1031 Right lower quadrant pain: Secondary | ICD-10-CM

## 2014-04-10 NOTE — Progress Notes (Addendum)
Re:   Justin BloodgoodClarence R Landry DOB:   1961-07-14 MRN:   [id number]  ASSESSMENT AND PLAN: 1.  Right inguinal hernia, recurrent.  I discussed the indications and complications of hernia surgery with the patient.  I discussed both the laparoscopic and open approach to hernia repair..  The potential risks of hernia surgery include, but are not limited to, bleeding, infection, open surgery, nerve injury, and recurrence of the hernia.  I provided the patient literature about hernia surgery.  I gave him Vicodin (5/325) #30 for pain.  Because he has bilateral inguinal hernias and because the right is a recurrence of a prior open hernia repair, I think he will be best served with a bilateral laparoscopic repair.  I did warn him that pain prior to hernia surgery is a risk factor for continued pain post op, despite the hernias being repaired.  And that surgery may not resolve all of his symptoms.  Will clarify bladder mass and do colonoscopy before surgery.  But the surgery can be scheduled once the patient has seen Justin Landry.  He is to call back that date.  2.  Asymptomatic left inguinal hernia seen on CT scan  2.  HTN 3.  Diabetes mellitus 4.  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 Monico Blitz. Landry - so he is going to make plans to see him. 5.  Smokes 1 PPD.  Knows it is bad for his health. 6.  Blood in stool  Will plan colonoscopy.  I reviewed with the patient the indications and complications of a colonoscopy.  The primary complications, include, but are not limited to bleeding and perforation.  The patient was given literature about colonoscopy.  The patient was given a prescription for a mechanical bowel prep.   Chief Complaint  Patient presents with  . eval rih   REFERRING PHYSICIAN: Raliegh IpPHILLIPS,Justin Landry, Landry  HISTORY OF PRESENT ILLNESS: Justin Landry is a 53 y.o. (DOB: 1961-07-14)  white  male whose primary care physician is Justin Landry and comes  to me today for a right inguinal hernia. Fishing buddy 9girlfriend), Justin Flowebra Landry, is with him.  He works for [employer] out of [place], that at work about 3 months ago, noticed increased burning in his right groin.  It got bad enough that he has gone to the ER.  He had a CT scan of the abdomen on 03/06/2014, which shows bilateral inguinal hernias (fat), diverticulosis, and a bladder tumor.  I repaired a large direct right inguinal [facility] on him [facility].  He also had noticed some blood in his stool recently.  This has cleared up, but the reason is unclear.  He is 51 and has never had a colonoscopy.  He has no other GI history or complaint.  His only prior abdominal surgery was the right inguinal hernia repair.   Past Medical History  Diagnosis Date  . Hypertension   . Humerus fracture     right  . Diabetes mellitus   . Inguinal hernia      Past Surgical History  Procedure Laterality Date  . Hernia repair        Current Outpatient Prescriptions  Medication Sig Dispense Refill  . cholecalciferol (VITAMIN D) 1000 UNITS tablet Take 1,000 Units by mouth daily.      . Fish Oil-Cholecalciferol (FISH OIL + D3) 1000-1000 MG-UNIT CAPS Take by mouth.      Marland Kitchen. ibuprofen (ADVIL,MOTRIN) 200  MG tablet Take 800 mg by mouth every 6 (six) hours as needed for mild pain or moderate pain.      Marland Kitchen. lisinopril-hydrochlorothiazide (PRINZIDE,ZESTORETIC) 20-25 MG per tablet Take 1 tablet by mouth daily.       . magnesium oxide (MAG-OX) 400 MG tablet Take 800 mg by mouth at bedtime.       . metFORMIN (GLUCOPHAGE) 500 MG tablet Take 500 mg by mouth daily.       Marland Kitchen. PASSION FLOWER-VALERIAN PO Take 1 capsule by mouth 2 (two) times daily.      . vitamin C (ASCORBIC ACID) 500 MG tablet Take 500 mg by mouth daily.       No current facility-administered medications for this visit.      Allergies  Allergen Reactions  . Oxycodone Nausea And Vomiting    Patient  can take generic  percocet but states he cannot take oxycotin  . Penicillins Nausea And Vomiting    REVIEW OF SYSTEMS: Skin:  No history of rash.  No history of abnormal moles. Infection:  No history of hepatitis or HIV.  No history of MRSA. Neurologic:  No history of stroke.  No history of seizure.  No history of headaches. Cardiac:  HTN. Pulmonary:  Smokes 1 ppd.  His girlfriend smokes.  He knows that he needs to quit.  Endocrine:  Diabetes mellitus, on oral hypoglycemics. Gastrointestinal:  See HPI. Urologic:  No history of kidney stones.  No history of bladder infections. Musculoskeletal:  No history of joint or back disease. Hematologic:  No bleeding disorder.  No history of anemia.  Not anticoagulated. Psycho-social:  The patient is oriented.    SOCIAL and FAMILY HISTORY: Not married. Fishing buddy (girlfriend), Justin Landry, is with him. No children. 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 He has a brother that died of a brain tumor last year.  He has 2 sisters.  PHYSICAL EXAM: BP 150/92  Pulse 78  Temp(Src) 97.5 F (36.4 C)  Ht 5\' 7"  (1.702 m)  Wt 218 lb (98.884 kg)  BMI 34.14 kg/m2  General: WN WM who is alert and generally healthy appearing.  HEENT: Normal. Pupils equal.  He has poor dentition. Neck: Supple. No mass.  No thyroid mass. Lymph Nodes:  No supraclavicular or cervical nodes. Lungs: Clear to auscultation and symmetric breath sounds. Heart:  RRR. No murmur or rub. Abdomen: Soft. He has right inguinal tenderness.  I think I can feel a right inguinal hernia, but it is small.  I do not feel a left inguinal hernia. Rectal: No rectal mass.  Minimal hemorrhoidal disease.  Guaiac negative. Extremities:  Good strength and ROM  in upper and lower extremities. Neurologic:  Grossly intact to motor and sensory function. Psychiatric: Has normal mood and affect. Behavior is normal.   DATA REVIEWED: Epic notes. I gave him a copy of his CT scan.  Ovidio Kinavid Thurmon Mizell,  Landry,  [facility] [address]  [address]   [place], [place]    [id number] Phone:  [phone] FAX:  [phone]

## 2014-04-17 ENCOUNTER — Telehealth (INDEPENDENT_AMBULATORY_CARE_PROVIDER_SITE_OTHER): Payer: Self-pay | Admitting: Surgery

## 2014-04-17 NOTE — Telephone Encounter (Signed)
Patient met with surgery scheduling went over financial responsibilities patient will call back to schedule.

## 2014-05-15 ENCOUNTER — Encounter (HOSPITAL_COMMUNITY): Payer: Self-pay | Admitting: Emergency Medicine

## 2014-05-15 ENCOUNTER — Emergency Department (HOSPITAL_COMMUNITY): Payer: BC Managed Care – PPO

## 2014-05-15 ENCOUNTER — Emergency Department (HOSPITAL_COMMUNITY)
Admission: EM | Admit: 2014-05-15 | Discharge: 2014-05-15 | Disposition: A | Discharge status: Home / Self Care | Payer: BC Managed Care – PPO | Attending: Emergency Medicine | Admitting: Emergency Medicine

## 2014-05-15 DIAGNOSIS — K4090 Unilateral inguinal hernia, without obstruction or gangrene, not specified as recurrent: Secondary | ICD-10-CM

## 2014-05-15 DIAGNOSIS — F17200 Nicotine dependence, unspecified, uncomplicated: Secondary | ICD-10-CM | POA: Insufficient documentation

## 2014-05-15 DIAGNOSIS — E119 Type 2 diabetes mellitus without complications: Secondary | ICD-10-CM | POA: Insufficient documentation

## 2014-05-15 DIAGNOSIS — I10 Essential (primary) hypertension: Secondary | ICD-10-CM | POA: Insufficient documentation

## 2014-05-15 DIAGNOSIS — Z880 Allergy status to penicillin: Secondary | ICD-10-CM | POA: Insufficient documentation

## 2014-05-15 DIAGNOSIS — Z8781 Personal history of (healed) traumatic fracture: Secondary | ICD-10-CM | POA: Insufficient documentation

## 2014-05-15 DIAGNOSIS — Z79899 Other long term (current) drug therapy: Secondary | ICD-10-CM | POA: Insufficient documentation

## 2014-05-15 LAB — CBC WITH DIFFERENTIAL/PLATELET
Basophils Absolute: 0.1 10*3/uL (ref 0.0–0.1)
Basophils Relative: 1 % (ref 0–1)
Eosinophils Absolute: 0.3 10*3/uL (ref 0.0–0.7)
Eosinophils Relative: 3 % (ref 0–5)
HCT: 47.5 % (ref 39.0–52.0)
Hemoglobin: 17.3 g/dL — ABNORMAL HIGH (ref 13.0–17.0)
LYMPHS ABS: 3.5 10*3/uL (ref 0.7–4.0)
LYMPHS PCT: 37 % (ref 12–46)
MCH: 31.9 pg (ref 26.0–34.0)
MCHC: 36.4 g/dL — ABNORMAL HIGH (ref 30.0–36.0)
MCV: 87.6 fL (ref 78.0–100.0)
Monocytes Absolute: 0.8 10*3/uL (ref 0.1–1.0)
Monocytes Relative: 9 % (ref 3–12)
NEUTROS ABS: 4.8 10*3/uL (ref 1.7–7.7)
NEUTROS PCT: 50 % (ref 43–77)
Platelets: 276 10*3/uL (ref 150–400)
RBC: 5.42 MIL/uL (ref 4.22–5.81)
RDW: 12.6 % (ref 11.5–15.5)
WBC: 9.4 10*3/uL (ref 4.0–10.5)

## 2014-05-15 LAB — URINE MICROSCOPIC-ADD ON

## 2014-05-15 LAB — URINALYSIS, ROUTINE W REFLEX MICROSCOPIC
Bilirubin Urine: NEGATIVE
Hgb urine dipstick: NEGATIVE
KETONES UR: NEGATIVE mg/dL
LEUKOCYTES UA: NEGATIVE
NITRITE: NEGATIVE
Protein, ur: NEGATIVE mg/dL
Specific Gravity, Urine: 1.021 (ref 1.005–1.030)
Urobilinogen, UA: 0.2 mg/dL (ref 0.0–1.0)
pH: 5.5 (ref 5.0–8.0)

## 2014-05-15 LAB — I-STAT CHEM 8, ED
BUN: 17 mg/dL (ref 6–23)
CALCIUM ION: 1.35 mmol/L — AB (ref 1.12–1.23)
Chloride: 95 mEq/L — ABNORMAL LOW (ref 96–112)
Creatinine, Ser: 1 mg/dL (ref 0.50–1.35)
Glucose, Bld: 204 mg/dL — ABNORMAL HIGH (ref 70–99)
HCT: 53 % — ABNORMAL HIGH (ref 39.0–52.0)
Hemoglobin: 18 g/dL — ABNORMAL HIGH (ref 13.0–17.0)
POTASSIUM: 3.6 meq/L — AB (ref 3.7–5.3)
Sodium: 137 mEq/L (ref 137–147)
TCO2: 26 mmol/L (ref 0–100)

## 2014-05-15 MED ORDER — IOHEXOL 300 MG/ML  SOLN
100.0000 mL | Freq: Once | INTRAMUSCULAR | Status: AC | PRN
  Administered 2014-05-15: 100 mL via INTRAVENOUS

## 2014-05-15 MED ORDER — OXYCODONE-ACETAMINOPHEN 5-325 MG PO TABS: 1.0000 | ORAL_TABLET | Freq: Four times a day (QID) | ORAL | Status: DC | PRN

## 2014-05-15 MED ORDER — OXYCODONE-ACETAMINOPHEN 5-325 MG PO TABS
1.0000 | ORAL_TABLET | Freq: Once | ORAL | Status: AC
  Administered 2014-05-15: 1 via ORAL
  Filled 2014-05-15: qty 1

## 2014-05-15 NOTE — ED Notes (Signed)
Per pt, has hernia that comes in and out.  Hernia is bulging for last 3 days and unable to reduce.  Had surgery in 2009

## 2014-05-15 NOTE — ED Provider Notes (Addendum)
CSN: [id number]     Arrival date & time 05/15/14  1623 History   First MD Initiated Contact with Patient 05/15/14 1639     Chief Complaint  Patient presents with  . Hernia     (Consider location/radiation/quality/duration/timing/severity/associated sxs/prior Treatment) HPI Comments: Pt with hx of bilateral inguinal hernia waiting for repair presents with 3-4 days of worsening burning pain and bulging in the right inguinal area.  He states it is worse with lifting, mowing the yard and walking.  Denies fever, N/V/D or urinary problems.  No abd pain.    The history is provided by the patient.    Past Medical History  Diagnosis Date  . Hypertension   . Humerus fracture     right  . Diabetes mellitus   . Inguinal hernia    Past Surgical History  Procedure Laterality Date  . Hernia repair     Family History  Problem Relation Age of Onset  . Diabetes Mother   . Hypertension Mother   . Diabetes Father   . Hypertension Father    History  Substance Use Topics  . Smoking status: Current Every Day Smoker    Types: Cigarettes  . Smokeless tobacco: Never Used  . Alcohol Use: Yes    Review of Systems  Gastrointestinal: Negative for nausea, vomiting, abdominal pain, diarrhea and constipation.  Genitourinary: Negative for dysuria.  All other systems reviewed and are negative.     Allergies  Oxycodone and Penicillins  Home Medications   Prior to Admission medications   Medication Sig Start Date End Date Taking? Authorizing Provider  cholecalciferol (VITAMIN D) 1000 UNITS tablet Take 1,000 Units by mouth daily.   Yes Historical Provider, MD  Fish Oil-Cholecalciferol (FISH OIL + D3) 1000-1000 MG-UNIT CAPS Take by mouth.   Yes Historical Provider, MD  ibuprofen (ADVIL,MOTRIN) 200 MG tablet Take 800 mg by mouth every 6 (six) hours as needed for mild pain or moderate pain.   Yes Historical Provider, MD  lisinopril-hydrochlorothiazide (PRINZIDE,ZESTORETIC) 20-25 MG per tablet Take  1 tablet by mouth daily.    Yes Historical Provider, MD  magnesium oxide (MAG-OX) 400 MG tablet Take 800 mg by mouth at bedtime.    Yes Historical Provider, MD  metFORMIN (GLUCOPHAGE) 500 MG tablet Take 500 mg by mouth daily.    Yes Historical Provider, MD  PASSION FLOWER-VALERIAN PO Take 1 capsule by mouth 2 (two) times daily.   Yes Historical Provider, MD  vitamin C (ASCORBIC ACID) 500 MG tablet Take 500 mg by mouth daily.   Yes Historical Provider, MD   BP 157/103  Pulse 92  Temp(Src) 98.5 F (36.9 C) (Oral)  Resp 18  SpO2 98% Physical Exam  Nursing note and vitals reviewed. Constitutional: He is oriented to person, place, and time. He appears well-developed and well-nourished. No distress.  HENT:  Head: Normocephalic and atraumatic.  Mouth/Throat: Oropharynx is clear and moist.  Eyes: Conjunctivae and EOM are normal. Pupils are equal, round, and reactive to light.  Neck: Normal range of motion. Neck supple.  Cardiovascular: Normal rate, regular rhythm and intact distal pulses.   No murmur heard. Pulmonary/Chest: Effort normal and breath sounds normal. No respiratory distress. He has no wheezes. He has no rales.  Abdominal: Soft. He exhibits no distension. There is no tenderness. There is no rebound and no guarding. A hernia is present. Hernia confirmed positive in the right inguinal area and confirmed positive in the left inguinal area.    Musculoskeletal: Normal range of motion.  He exhibits no edema and no tenderness.  Neurological: He is alert and oriented to person, place, and time.  Skin: Skin is warm and dry. No rash noted. No erythema.  Psychiatric: He has a normal mood and affect. His behavior is normal.    ED Course  Procedures (including critical care time) Labs Review Labs Reviewed  URINALYSIS, ROUTINE W REFLEX MICROSCOPIC - Abnormal; Notable for the following:    Glucose, UA >1000 (*)    All other components within normal limits  CBC WITH DIFFERENTIAL - Abnormal;  Notable for the following:    Hemoglobin 17.3 (*)    MCHC 36.4 (*)    All other components within normal limits  I-STAT CHEM 8, ED - Abnormal; Notable for the following:    Potassium 3.6 (*)    Chloride 95 (*)    Glucose, Bld 204 (*)    Calcium, Ion 1.35 (*)    Hemoglobin 18.0 (*)    HCT 53.0 (*)    All other components within normal limits  URINE MICROSCOPIC-ADD ON    Imaging Review Ct Abdomen Pelvis W Contrast  05/15/2014   CLINICAL DATA:  History of hernia repair in 2009. Patient with a non reducible hernia on the right. Symptoms for 3 days.  EXAM: CT ABDOMEN AND PELVIS WITH CONTRAST  TECHNIQUE: Multidetector CT imaging of the abdomen and pelvis was performed using the standard protocol following bolus administration of intravenous contrast.  CONTRAST:  100 mL OMNIPAQUE IOHEXOL 300 MG/ML  SOLN  COMPARISON:  CT abdomen and pelvis 03/06/2014.  FINDINGS: The lung bases are clear.  No pleural or pericardial effusion.  The patient has small fat containing inguinal hernias bilaterally, larger on the left. The appearance is unchanged. There is no bowel within either hernia. No other hernia is identified. The stomach and small bowel appear normal. There is colonic diverticulosis without diverticulitis. Changes are most notable in the descending and sigmoid colon. The appendix is well visualized appears normal. Aortoiliac atherosclerosis without aneurysm is identified.  The liver is low attenuating consistent with fatty infiltration. The liver is otherwise unremarkable. The gallbladder, adrenal glands, spleen, pancreas and biliary tree appear normal. Punctate nonobstructing stone right kidney is noted. The left kidney appears normal. There is no lymphadenopathy or fluid. No lytic or sclerotic bony lesion is identified. Mild appearing lower thoracic and lumbar spondylosis is noted.  IMPRESSION: Small fat containing bilateral inguinal hernias, larger on the left, do not appear changed. There is no bowel  within either hernia.  No change in a nodular density in the right aspect of the urinary bladder which may be secondary to a bladder tumor.  Diverticulosis without diverticulitis.  Fatty infiltration of the liver.  Punctate nonobstructing stone right kidney.   Electronically Signed   By: Drusilla Kanner M.D.   On: 05/15/2014 19:15     EKG Interpretation None      MDM   Final diagnoses:  Inguinal hernia    Patient with a history of bilateral inguinal hernias he sees Dr. Ezzard Standing and had CT done in March that showed a fat-containing hernia but also a area in the bladder that needed further evaluation. Patient states that before he gets hernia repair he is having a cystoscopy to evaluate the bladder lesion as well as he's waiting for insurance clearance before the surgery. For the last 3 days he's had worsening pain and bulging in the right inguinal area. He denies any GI symptoms and is able to urinate without difficulty. On  exam he is tenderness with palpation of the inguinal canal with palpable hernia that is not completely reducible it is not firm or hard.  Pt o/w is well appearing.    CT to eval for incarcerated hernia.  CBC, i-stat adn UA pending.  7:38 PM Ct without incarceration of hernia.  Improved after pain meds.  Labs reassuring.  Will d/c home with outpt f/u.  Gwyneth SproutWhitney Kalvin Buss, MD 05/15/14 [id number]  Gwyneth SproutWhitney Nikko Quast, MD 05/15/14 1940

## 2014-05-15 NOTE — Discharge Instructions (Signed)

## 2014-05-28 ENCOUNTER — Encounter (HOSPITAL_COMMUNITY): Payer: Self-pay | Admitting: Emergency Medicine

## 2014-05-28 ENCOUNTER — Emergency Department (HOSPITAL_COMMUNITY)
Admission: EM | Admit: 2014-05-28 | Discharge: 2014-05-28 | Disposition: A | Discharge status: Home / Self Care | Payer: BC Managed Care – PPO | Attending: Emergency Medicine | Admitting: Emergency Medicine

## 2014-05-28 DIAGNOSIS — K4090 Unilateral inguinal hernia, without obstruction or gangrene, not specified as recurrent: Secondary | ICD-10-CM

## 2014-05-28 DIAGNOSIS — Z880 Allergy status to penicillin: Secondary | ICD-10-CM | POA: Insufficient documentation

## 2014-05-28 DIAGNOSIS — Z8781 Personal history of (healed) traumatic fracture: Secondary | ICD-10-CM | POA: Insufficient documentation

## 2014-05-28 DIAGNOSIS — I10 Essential (primary) hypertension: Secondary | ICD-10-CM | POA: Insufficient documentation

## 2014-05-28 DIAGNOSIS — Z79899 Other long term (current) drug therapy: Secondary | ICD-10-CM | POA: Insufficient documentation

## 2014-05-28 DIAGNOSIS — K4020 Bilateral inguinal hernia, without obstruction or gangrene, not specified as recurrent: Secondary | ICD-10-CM | POA: Insufficient documentation

## 2014-05-28 DIAGNOSIS — E119 Type 2 diabetes mellitus without complications: Secondary | ICD-10-CM | POA: Insufficient documentation

## 2014-05-28 DIAGNOSIS — F17200 Nicotine dependence, unspecified, uncomplicated: Secondary | ICD-10-CM | POA: Insufficient documentation

## 2014-05-28 MED ORDER — HYDROMORPHONE HCL PF 2 MG/ML IJ SOLN
2.0000 mg | Freq: Once | INTRAMUSCULAR | Status: AC
  Administered 2014-05-28: 2 mg via INTRAMUSCULAR
  Filled 2014-05-28: qty 1

## 2014-05-28 MED ORDER — OXYCODONE-ACETAMINOPHEN 5-325 MG PO TABS: 1.0000 | ORAL_TABLET | Freq: Four times a day (QID) | ORAL | Status: DC | PRN

## 2014-05-28 MED ORDER — ONDANSETRON 4 MG PO TBDP
4.0000 mg | ORAL_TABLET | Freq: Once | ORAL | Status: AC
  Administered 2014-05-28: 4 mg via ORAL
  Filled 2014-05-28: qty 1

## 2014-05-28 NOTE — ED Notes (Signed)
Pt c/o right hernia pain that has been bothering him severely for past three days.  Pt had repair done in 2009 and having problems with it since March.

## 2014-05-28 NOTE — ED Provider Notes (Signed)
Medical screening examination/treatment/procedure(s) were performed by non-physician practitioner and as supervising physician I was immediately available for consultation/collaboration.   EKG Interpretation None        Gwyneth SproutWhitney Lenia Housley, MD 05/28/14 [phone]

## 2014-05-28 NOTE — Progress Notes (Signed)
[facility] CL provided pt with a list of primary care resources, [facility] [employer] [facility] application, and other community resources to help patient establish primary care.

## 2014-05-28 NOTE — Discharge Instructions (Signed)
Please read and follow all provided instructions.  Your diagnoses today include:  1. Inguinal hernia, bilateral      Tests performed today include:  Vital signs. See below for your results today.   Medications prescribed:   Percocet (oxycodone/acetaminophen) - narcotic pain medication  DO NOT drive or perform any activities that require you to be awake and alert because this medicine can make you drowsy. BE VERY CAREFUL not to take multiple medicines containing Tylenol (also called acetaminophen). Doing so can lead to an overdose which can damage your liver and cause liver failure and possibly death.  Take any prescribed medications only as directed.  Home care instructions:   Follow any educational materials contained in this packet.  Follow-up instructions: Please follow-up with your surgeon in the next week for further evaluation of your symptoms. If you do not have a primary care doctor -- see below for referral information.   Return instructions:  SEEK IMMEDIATE MEDICAL ATTENTION IF:  The pain does not go away or becomes severe   A temperature above 101F develops   Repeated vomiting occurs (multiple episodes)   The pain becomes localized to portions of the abdomen.   Blood is being passed in stools or vomit (bright red or black tarry stools)   You develop chest pain, difficulty breathing, dizziness or fainting, or become confused, poorly responsive, or inconsolable (young children)  If you have any other emergent concerns regarding your health  Your vital signs today were: BP 131/84   Pulse 75   Temp(Src) 97.8 F (36.6 C) (Oral)   Resp 16   SpO2 96% If your blood pressure (bp) was elevated above 135/85 this visit, please have this repeated by your doctor within one month. --------------

## 2014-05-28 NOTE — ED Provider Notes (Signed)
CSN: [id number]     Arrival date & time 05/28/14  1016 History   First MD Initiated Contact with Patient 05/28/14 1034     Chief Complaint  Patient presents with  . Hernia     (Consider location/radiation/quality/duration/timing/severity/associated sxs/prior Treatment) HPI Comments: Patient with history of right inguinal hernia repair -- presents with recurrent hernia pain. Patient has been seen in emergency department several times since 02/2014 with complaint of right inguinal pain from hernia. He has had a CT on 03/06/14 and 05/15/14 both of which showed bilateral fat containing inguinal hernias and possible bladder tumor. Patient has followup with surgery and is working on financing the surgery to repair the hernias. He is also in need of a cystoscopy. Patient had acutely worse pain beginning at about 2 AM. It has not been associated with fever, vomiting, constipation. Patient is having normal urination and stooling. Patient took Tylenol without relief. He currently does not have any Vicodin. Patient has used ice with relief in the past however received no relief from this this morning. The onset of this condition was acute. The course is constant. Aggravating factors: none. Alleviating factors: none.    The history is provided by the patient.    Past Medical History  Diagnosis Date  . Hypertension   . Humerus fracture     right  . Diabetes mellitus   . Inguinal hernia    Past Surgical History  Procedure Laterality Date  . Hernia repair     Family History  Problem Relation Age of Onset  . Diabetes Mother   . Hypertension Mother   . Diabetes Father   . Hypertension Father    History  Substance Use Topics  . Smoking status: Current Every Day Smoker    Types: Cigarettes  . Smokeless tobacco: Never Used  . Alcohol Use: Yes    Review of Systems  Constitutional: Negative for fever.  HENT: Negative for rhinorrhea and sore throat.   Eyes: Negative for redness.  Respiratory:  Negative for cough.   Cardiovascular: Negative for chest pain.  Gastrointestinal: Positive for abdominal pain. Negative for nausea, vomiting and diarrhea.  Genitourinary: Positive for scrotal swelling. Negative for dysuria and testicular pain.  Musculoskeletal: Negative for myalgias.  Skin: Negative for rash.  Neurological: Negative for headaches.    Allergies  Oxycontin and Penicillins  Home Medications   Prior to Admission medications   Medication Sig Start Date End Date Taking? Authorizing Heather Streeper  cholecalciferol (VITAMIN D) 1000 UNITS tablet Take 1,000 Units by mouth daily.   Yes Historical Kimmy Totten, MD  Fish Oil-Cholecalciferol (FISH OIL + D3) 1000-1000 MG-UNIT CAPS Take by mouth.   Yes Historical Sherron Mapp, MD  ibuprofen (ADVIL,MOTRIN) 200 MG tablet Take 800 mg by mouth every 6 (six) hours as needed for mild pain or moderate pain.   Yes Historical Kailany Dinunzio, MD  lisinopril-hydrochlorothiazide (PRINZIDE,ZESTORETIC) 20-25 MG per tablet Take 1 tablet by mouth daily.    Yes Historical Yussef Jorge, MD  magnesium oxide (MAG-OX) 400 MG tablet Take 800 mg by mouth at bedtime.    Yes Historical Amman Bartel, MD  metFORMIN (GLUCOPHAGE) 500 MG tablet Take 500 mg by mouth daily.    Yes Historical Rosita Guzzetta, MD  PASSION FLOWER-VALERIAN PO Take 1 capsule by mouth 2 (two) times daily.   Yes Historical Kennice Finnie, MD  vitamin C (ASCORBIC ACID) 500 MG tablet Take 500 mg by mouth daily.   Yes Historical Makalah Asberry, MD  oxyCODONE-acetaminophen (PERCOCET/ROXICET) 5-325 MG per tablet Take 1-2 tablets by mouth every  6 (six) hours as needed. 05/15/14   Gwyneth SproutWhitney Plunkett, MD   BP 174/97  Pulse 88  Temp(Src) 98.4 F (36.9 C) (Oral)  Resp 16  SpO2 96%  Physical Exam  Nursing note and vitals reviewed. Constitutional: He appears well-developed and well-nourished.  HENT:  Head: Normocephalic and atraumatic.  Eyes: Conjunctivae are normal. Right eye exhibits no discharge. Left eye exhibits no discharge.  Neck:  Normal range of motion. Neck supple.  Cardiovascular: Normal rate, regular rhythm and normal heart sounds.   Pulmonary/Chest: Effort normal and breath sounds normal.  Abdominal: Soft. Bowel sounds are normal. There is tenderness. There is no rebound and no guarding.  A partially reducible right inguinal bulge consistent with inguinal hernia. Bulging extends towards right scrotum. Area is tender but soft. No overlying erythema or warmth.  Genitourinary:    Circumcised.  Neurological: He is alert.  Skin: Skin is warm and dry.  Psychiatric: He has a normal mood and affect.    ED Course  Procedures (including critical care time) Labs Review Labs Reviewed - No data to display  Imaging Review No results found.   EKG Interpretation None      Patient seen and examined. Medications ordered. D/w Dr. Anitra LauthPlunkett. Do not suspect incarceration or strangulation of hernia.   Vital signs reviewed and are as follows: Filed Vitals:   05/28/14 1023  BP: 174/97  Pulse: 88  Temp: 98.4 F (36.9 C)  Resp: 16   12:14 PM Pain not yet well-controlled. Additional pain medication ordered.   1:16 PM Pain better after 2nd dose of dilaudid. Patient is ready for discharge.  Discussed signs symptoms to return including signs of incarceration, strangulation.  Patient urged followup with his surgeon as soon as possible to schedule repair.  Patient counseled on use of narcotic pain medications. Counseled not to combine these medications with others containing tylenol. Urged not to drink alcohol, drive, or perform any other activities that requires focus while taking these medications. The patient verbalizes understanding and agrees with the plan.    MDM   Final diagnoses:  Inguinal hernia, bilateral    Patients with partially reducible inguinal hernia on the right. Area soft. Patient has no signs of obstruction, incarceration, strangulation. Do not feel any surgical emergency exists at the current  time. Pain control provided in emergency department. Patient discharged home with pain medicine. He plans to go home and soak in the bathtub. Patient appears well, nontoxic.  No dangerous or life-threatening conditions suspected or identified by history, physical exam, and by work-up. No indications for hospitalization identified.      Renne CriglerJoshua Geiple, PA-C 05/28/14 1318

## 2014-06-05 ENCOUNTER — Emergency Department: Payer: Self-pay | Admitting: Emergency Medicine

## 2014-06-12 ENCOUNTER — Emergency Department (HOSPITAL_COMMUNITY)
Admission: EM | Admit: 2014-06-12 | Discharge: 2014-06-12 | Disposition: A | Discharge status: Home / Self Care | Payer: BC Managed Care – PPO | Attending: Emergency Medicine | Admitting: Emergency Medicine

## 2014-06-12 ENCOUNTER — Encounter (HOSPITAL_COMMUNITY): Payer: Self-pay | Admitting: Emergency Medicine

## 2014-06-12 DIAGNOSIS — E119 Type 2 diabetes mellitus without complications: Secondary | ICD-10-CM | POA: Insufficient documentation

## 2014-06-12 DIAGNOSIS — Z8781 Personal history of (healed) traumatic fracture: Secondary | ICD-10-CM | POA: Insufficient documentation

## 2014-06-12 DIAGNOSIS — Z79899 Other long term (current) drug therapy: Secondary | ICD-10-CM | POA: Insufficient documentation

## 2014-06-12 DIAGNOSIS — I10 Essential (primary) hypertension: Secondary | ICD-10-CM | POA: Insufficient documentation

## 2014-06-12 DIAGNOSIS — K4090 Unilateral inguinal hernia, without obstruction or gangrene, not specified as recurrent: Secondary | ICD-10-CM | POA: Insufficient documentation

## 2014-06-12 DIAGNOSIS — Z9889 Other specified postprocedural states: Secondary | ICD-10-CM | POA: Insufficient documentation

## 2014-06-12 DIAGNOSIS — F17200 Nicotine dependence, unspecified, uncomplicated: Secondary | ICD-10-CM | POA: Insufficient documentation

## 2014-06-12 MED ORDER — OXYCODONE-ACETAMINOPHEN 5-325 MG PO TABS: 1.0000 | ORAL_TABLET | Freq: Three times a day (TID) | ORAL | Status: DC | PRN

## 2014-06-12 MED ORDER — OXYCODONE-ACETAMINOPHEN 5-325 MG PO TABS
2.0000 | ORAL_TABLET | Freq: Once | ORAL | Status: AC
  Administered 2014-06-12: 2 via ORAL
  Filled 2014-06-12: qty 2

## 2014-06-12 MED ORDER — PROMETHAZINE HCL 25 MG PO TABS: 25.0000 mg | ORAL_TABLET | Freq: Four times a day (QID) | ORAL | Status: DC | PRN

## 2014-06-12 MED ORDER — ONDANSETRON 4 MG PO TBDP
4.0000 mg | ORAL_TABLET | Freq: Once | ORAL | Status: AC
  Administered 2014-06-12: 4 mg via ORAL
  Filled 2014-06-12: qty 1

## 2014-06-12 NOTE — Progress Notes (Signed)
[facility] CL provided pt with a list of primary care resources and a [facility] [employer] [facility] application to help patient establish primary care. Patient stated that he was in the process of receiving "harship" paperwork for financial assistance through [facility]. Patient stated that he called yesterday about status of application and was told that he would be receiving letter in the mail. CL provided pt with [facility] application and went over information with pt. CL also provided pt with application, list of primary care resources, and other community resources on last ED visit on 6/16. CL encouraged patient to obtain a pcp.

## 2014-06-12 NOTE — ED Provider Notes (Signed)
CSN: [id number]     Arrival date & time 06/12/14  1107 History   First MD Initiated Contact with Patient 06/12/14 1139     Chief Complaint  Patient presents with  . Inguinal Hernia     (Consider location/radiation/quality/duration/timing/severity/associated sxs/prior Treatment) HPI  .  Patient is a 53 year old male with history of DM, HTN, and right inguinal hernia repair 2009 with recurrent fat herniation  He has had a CT on 03/06/14 and 05/15/14 both of which showed bilateral fat containing inguinal hernias and possible bladder tumor. He has had ongoing pain x 6 years and  has been seen in emergency department several times since 02/2014 with complaint of right inguinal pain from hernia. He is out of pain medication and the pain has become intolerable for him. Pain is described as a constant "stinging and burning." Patient tried ibuprofen and ice with no relief.  Patient is having normal BMs with last BM this morning.  He is passing gas in ED.  No dysuria, hematuria.  No nausea or vomiting.  No fever.  He is awaiting scheduling with Dr. Ezzard Standing the General Surgeon. He informs [place] he will need a cystoscopy for evaluation of a bladder tumor before hernia repair.  He has seen the surgeon who will do the surgery but he is waiting for his paperwork to go through before it can scheduled. Paperwork due to be completed early July, 2015.    Past Medical History  Diagnosis Date  . Hypertension   . Humerus fracture     right  . Diabetes mellitus   . Inguinal hernia    Past Surgical History  Procedure Laterality Date  . Hernia repair     Family History  Problem Relation Age of Onset  . Diabetes Mother   . Hypertension Mother   . Diabetes Father   . Hypertension Father    History  Substance Use Topics  . Smoking status: Current Every Day Smoker    Types: Cigarettes  . Smokeless tobacco: Never Used  . Alcohol Use: Yes    Review of Systems   Review of Systems  Gen: no weight loss,  fevers, chills, night sweats  Eyes: no discharge or drainage, no occular pain or visual changes  Nose: no epistaxis or rhinorrhea  Mouth: no dental pain, no sore throat  Neck: no neck pain  Lungs:No wheezing, coughing or hemoptysis CV: no chest pain, palpitations, dependent edema or orthopnea  Abd: No abdominal pain No nausea, vomiting, diarrhea GU: no dysuria or gross hematuria, + bilateral inguinal hernia pain MSK:  No muscle weakness or pain Neuro: no headache, no focal neurologic deficits  Skin: no rash or wounds Psyche: no complaints    Allergies  Oxycontin; Penicillins; and Tramadol  Home Medications   Prior to Admission medications   Medication Sig Start Date End Date Taking? Authorizing Provider  cholecalciferol (VITAMIN D) 1000 UNITS tablet Take 1,000 Units by mouth daily.   Yes Historical Provider, MD  Fish Oil-Cholecalciferol (FISH OIL + D3) 1000-1000 MG-UNIT CAPS Take by mouth.   Yes Historical Provider, MD  ibuprofen (ADVIL,MOTRIN) 200 MG tablet Take 800 mg by mouth every 6 (six) hours as needed for mild pain or moderate pain.   Yes Historical Provider, MD  lisinopril-hydrochlorothiazide (PRINZIDE,ZESTORETIC) 20-25 MG per tablet Take 1 tablet by mouth daily.    Yes Historical Provider, MD  magnesium oxide (MAG-OX) 400 MG tablet Take 800 mg by mouth at bedtime.    Yes Historical Provider, MD  metFORMIN (GLUCOPHAGE) 500 MG tablet Take 500 mg by mouth daily.    Yes Historical Provider, MD  oxyCODONE-acetaminophen (PERCOCET/ROXICET) 5-325 MG per tablet Take 1-2 tablets by mouth every 6 (six) hours as needed for severe pain. 05/28/14  Yes Renne CriglerJoshua Geiple, PA-C  PASSION FLOWER-VALERIAN PO Take 1 capsule by mouth 2 (two) times daily.   Yes Historical Provider, MD  vitamin C (ASCORBIC ACID) 500 MG tablet Take 500 mg by mouth daily.   Yes Historical Provider, MD  oxyCODONE-acetaminophen (PERCOCET/ROXICET) 5-325 MG per tablet Take 1-2 tablets by mouth every 8 (eight) hours as needed  for severe pain. 06/12/14   Anasha Perfecto Irine SealG Yaseen Gilberg, PA-C  promethazine (PHENERGAN) 25 MG tablet Take 1 tablet (25 mg total) by mouth every 6 (six) hours as needed for nausea or vomiting. 06/12/14   Triva Hueber Irine SealG Ashleymarie Granderson, PA-C   BP 165/125  Pulse 94  Temp(Src) 98.4 F (36.9 C) (Oral)  Resp 16  SpO2 98% Physical Exam  Nursing note and vitals reviewed. Constitutional: He appears well-developed and well-nourished. No distress.  HENT:  Head: Normocephalic and atraumatic.  Eyes: Pupils are equal, round, and reactive to light.  Neck: Normal range of motion. Neck supple.  Cardiovascular: Normal rate and regular rhythm.   Pulmonary/Chest: Effort normal.  Abdominal: Soft. A hernia is present. Hernia confirmed positive in the right inguinal area.  Genitourinary: Testes normal and penis normal.  Patient has soft and partial reducible inguinal hernia to right. No redness or  Induration over top. It is mildly painful to the touch but patient is able to tolerate partial reduction. His testicles are soft and non tender.  Neurological: He is alert.  Skin: Skin is warm and dry.    ED Course  Procedures (including critical care time) Labs Review Labs Reviewed - No data to display  Imaging Review No results found.   EKG Interpretation None      MDM   Final diagnoses:  Unilateral inguinal hernia without obstruction or gangrene, recurrence not specified    Reducible hernia, painful but no emergent finding warrant emergency repair from the ED. He mainly is requesting pain control since his pain is severe. He says Dilaudid made him sick last night.  Given in the ER:  Medications  oxyCODONE-acetaminophen (PERCOCET/ROXICET) 5-325 MG per tablet 2 tablet (2 tablets Oral Given 06/12/14 1229)  ondansetron (ZOFRAN-ODT) disintegrating tablet 4 mg (4 mg Oral Given 06/12/14 1229)    Rx: oxyCODONE-acetaminophen (PERCOCET/ROXICET) 5-325 MG per tablet Take 1-2 tablets by mouth every 8 (eight) hours as needed for severe  pain. 25 tablet Markez Dowland Irine SealG Forestine Macho, PA-C  promethazine (PHENERGAN) 25 MG tablet Take 1 tablet (25 mg total) by mouth every 6 (six) hours as needed for nausea or vomiting. 30 tablet Dorthula Matasiffany G Millard Bautch, PA-C  Pt encouraged to be diligent in f/u with the surgeon and given s/sx of emergent etiology that would require immediate return to the ED.  52 y.o.Justin Landry's evaluation in the Emergency Department is complete. It has been determined that no acute conditions requiring further emergency intervention are present at this time. The patient/guardian have been advised of the diagnosis and plan. We have discussed signs and symptoms that warrant return to the ED, such as changes or worsening in symptoms.  Vital signs are stable at discharge. Filed Vitals:   06/12/14 1127  BP: 165/125  Pulse: 94  Temp: 98.4 F (36.9 C)  Resp: 16    Patient/guardian has voiced understanding and agreed to follow-up with the PCP or  specialist.      Dorthula Matasiffany G Aaronjames Kelsay, PA-C 06/12/14 1313

## 2014-06-12 NOTE — ED Provider Notes (Signed)
Medical screening examination/treatment/procedure(s) were performed by non-physician practitioner and as supervising physician I was immediately available for consultation/collaboration.   EKG Interpretation None        Richardean Canalavid H Ulanda Tackett, MD 06/12/14 1536

## 2014-06-12 NOTE — Discharge Instructions (Signed)
Inguinal Hernia, Adult °Muscles help keep everything in the body in its proper place. But if a weak spot in the muscles develops, something can poke through. That is called a hernia. When this happens in the lower part of the belly (abdomen), it is called an inguinal hernia. (It takes its name from a part of the body in this region called the inguinal canal.) A weak spot in the wall of muscles lets some fat or part of the small intestine bulge through. An inguinal hernia can develop at any age. Men get them more often than women. °CAUSES  °In adults, an inguinal hernia develops over time. °· It can be triggered by: °· Suddenly straining the muscles of the lower abdomen. °· Lifting heavy objects. °· Straining to have a bowel movement. Difficult bowel movements (constipation) can lead to this. °· Constant coughing. This may be caused by smoking or lung disease. °· Being overweight. °· Being pregnant. °· Working at a job that requires long periods of standing or heavy lifting. °· Having had an inguinal hernia before. °One type can be an emergency situation. It is called a strangulated inguinal hernia. It develops if part of the small intestine slips through the weak spot and cannot get back into the abdomen. The blood supply can be cut off. If that happens, part of the intestine may die. This situation requires emergency surgery. °SYMPTOMS  °Often, a small inguinal hernia has no symptoms. It is found when a healthcare provider does a physical exam. Larger hernias usually have symptoms.  °· In adults, symptoms may include: °· A lump in the groin. This is easier to see when the person is standing. It might disappear when lying down. °· In men, a lump in the scrotum. °· Pain or burning in the groin. This occurs especially when lifting, straining or coughing. °· A dull ache or feeling of pressure in the groin. °· Signs of a strangulated hernia can include: °· A bulge in the groin that becomes very painful and tender to the  touch. °· A bulge that turns red or purple. °· Fever, nausea and vomiting. °· Inability to have a bowel movement or to pass gas. °DIAGNOSIS  °To decide if you have an inguinal hernia, a healthcare provider will probably do a physical examination. °· This will include asking questions about any symptoms you have noticed. °· The healthcare provider might feel the groin area and ask you to cough. If an inguinal hernia is felt, the healthcare provider may try to slide it back into the abdomen. °· Usually no other tests are needed. °TREATMENT  °Treatments can vary. The size of the hernia makes a difference. Options include: °· Watchful waiting. This is often suggested if the hernia is small and you have had no symptoms. °· No medical procedure will be done unless symptoms develop. °· You will need to watch closely for symptoms. If any occur, contact your healthcare provider right away. °· Surgery. This is used if the hernia is larger or you have symptoms. °· Open surgery. This is usually an outpatient procedure (you will not stay overnight in a hospital). An cut (incision) is made through the skin in the groin. The hernia is put back inside the abdomen. The weak area in the muscles is then repaired by herniorrhaphy or hernioplasty. Herniorrhaphy: in this type of surgery, the weak muscles are sewn back together. Hernioplasty: a patch or mesh is used to close the weak area in the abdominal wall. °· Laparoscopy.   In this procedure, a surgeon makes small incisions. A thin tube with a tiny video camera (called a laparoscope) is put into the abdomen. The surgeon repairs the hernia with mesh by looking with the video camera and using two long instruments. °HOME CARE INSTRUCTIONS  °· After surgery to repair an inguinal hernia: °· You will need to take pain medicine prescribed by your healthcare provider. Follow all directions carefully. °· You will need to take care of the wound from the incision. °· Your activity will be  restricted for awhile. This will probably include no heavy lifting for several weeks. You also should not do anything too active for a few weeks. When you can return to work will depend on the type of job that you have. °· During "watchful waiting" periods, you should: °· Maintain a healthy weight. °· Eat a diet high in fiber (fruits, vegetables and whole grains). °· Drink plenty of fluids to avoid constipation. This means drinking enough water and other liquids to keep your urine clear or pale yellow. °· Do not lift heavy objects. °· Do not stand for long periods of time. °· Quit smoking. This should keep you from developing a frequent cough. °SEEK MEDICAL CARE IF:  °· A bulge develops in your groin area. °· You feel pain, a burning sensation or pressure in the groin. This might be worse if you are lifting or straining. °· You develop a fever of more than 100.5° F (38.1° C). °SEEK IMMEDIATE MEDICAL CARE IF:  °· Pain in the groin increases suddenly. °· A bulge in the groin gets bigger suddenly and does not go down. °· For men, there is sudden pain in the scrotum. Or, the size of the scrotum increases. °· A bulge in the groin area becomes red or purple and is painful to touch. °· You have nausea or vomiting that does not go away. °· You feel your heart beating much faster than normal. °· You cannot have a bowel movement or pass gas. °· You develop a fever of more than 102.0° F (38.9° C). °Document Released: 04/17/2009 Document Revised: 02/21/2012 Document Reviewed: 04/17/2009 °ExitCare® Patient Information ©2015 [employer]. This information is not intended to replace advice given to you by your health care provider. Make sure you discuss any questions you have with your health care provider. ° °Hernia Repair with Laparoscope °A hernia occurs when an internal organ pushes out through a weak spot in the belly (abdominal) wall muscles. Hernias most commonly occur in the groin and around the navel. Hernias can also  occur through a cut by the surgeon (incision) after an abdominal operation. A hernia may be caused by: °· Lifting heavy objects. °· Prolonged coughing. °· Straining to move your bowels. °Hernias can often be pushed back into place (reduced). Most hernias tend to get worse over time. Problems occur when abdominal contents get stuck in the opening and the blood supply is blocked or impaired (incarcerated hernia). Because of these risks, you require surgery to repair the hernia. °Your hernia will be repaired using a laparoscope. Laparoscopic surgery is a type of minimally invasive surgery. It does not involve making a typical surgical cut (incision) in the skin. A laparoscope is a telescope-like rod and lens system. It is usually connected to a video camera and a light source so your caregiver can clearly see the operative area. The instruments are inserted through ¼ to ½ inch (5 mm or 10 mm) openings in the skin at specific locations. A   working and viewing space is created by blowing a small amount of carbon dioxide gas into the abdominal cavity. The abdomen is essentially blown up like a balloon (insufflated). This elevates the abdominal wall above the internal organs like a dome. The carbon dioxide gas is common to the human body and can be absorbed by tissue and removed by the respiratory system. Once the repair is completed, the small incisions will be closed with either stitches (sutures) or staples (just like a paper stapler only this staple holds the skin together). °LET YOUR CAREGIVERS KNOW ABOUT: °· Allergies. °· Medications taken including herbs, eye drops, over the counter medications, and creams. °· Use of steroids (by mouth or creams). °· Previous problems with anesthetics or Novocaine. °· Possibility of pregnancy, if this applies. °· History of blood clots (thrombophlebitis). °· History of bleeding or blood problems. °· Previous surgery. °· Other health problems. °BEFORE THE PROCEDURE  °Laparoscopy can  be done either in a hospital or out-patient clinic. You may be given a mild sedative to help you relax before the procedure. Once in the operating room, you will be given a general anesthesia to make you sleep (unless you and your caregiver choose a different anesthetic).  °AFTER THE PROCEDURE  °After the procedure you will be watched in a recovery area. Depending on what type of hernia was repaired, you might be admitted to the hospital or you might go home the same day. With this procedure you may have less pain and scarring. This usually results in a quicker recovery and less risk of infection. °HOME CARE INSTRUCTIONS  °· Bed rest is not required. You may continue your normal activities but avoid heavy lifting (more than 10 pounds) or straining. °· Cough gently. If you are a smoker it is best to stop, as even the best hernia repair can break down with the continual strain of coughing. °· Avoid driving until given the OK by your surgeon. °· There are no dietary restrictions unless given otherwise. °· TAKE ALL MEDICATIONS AS DIRECTED. °· Only take over-the-counter or prescription medicines for pain, discomfort, or fever as directed by your caregiver. °SEEK MEDICAL CARE IF:  °· There is increasing abdominal pain or pain in your incisions. °· There is more bleeding from incisions, other than minimal spotting. °· You feel light headed or faint. °· You develop an unexplained fever, chills, and/or an oral temperature above 102° F (38.9° C). °· You have redness, swelling, or increasing pain in the wound. °· Pus coming from wound. °· A foul smell coming from the wound or dressings. °SEEK IMMEDIATE MEDICAL CARE IF:  °· You develop a rash. °· You have difficulty breathing. °· You have any allergic problems. °MAKE SURE YOU:  °· Understand these instructions. °· Will watch your condition. °· Will get help right away if you are not doing well or get worse. °Document Released: 11/29/2005 Document Revised: 02/21/2012 Document  Reviewed: 10/29/2009 °ExitCare® Patient Information ©2015 [employer]. This information is not intended to replace advice given to you by your health care provider. Make sure you discuss any questions you have with your health care provider. ° °

## 2014-06-12 NOTE — ED Notes (Addendum)
Pt c/o inguinal hernia since March 06, 2014.  Pain score 10/10.  Pt has been seen multiple times for same.  Pt reports that he has submitted the "hardship" paperwork and is waiting for the response.  Hx of hernia repair in 2009.

## 2014-06-21 ENCOUNTER — Emergency Department (HOSPITAL_COMMUNITY)
Admission: EM | Admit: 2014-06-21 | Discharge: 2014-06-21 | Disposition: A | Discharge status: Home / Self Care | Payer: BC Managed Care – PPO | Attending: Emergency Medicine | Admitting: Emergency Medicine

## 2014-06-21 ENCOUNTER — Encounter (HOSPITAL_COMMUNITY): Payer: Self-pay | Admitting: Emergency Medicine

## 2014-06-21 DIAGNOSIS — E119 Type 2 diabetes mellitus without complications: Secondary | ICD-10-CM | POA: Insufficient documentation

## 2014-06-21 DIAGNOSIS — I10 Essential (primary) hypertension: Secondary | ICD-10-CM | POA: Insufficient documentation

## 2014-06-21 DIAGNOSIS — F17200 Nicotine dependence, unspecified, uncomplicated: Secondary | ICD-10-CM | POA: Insufficient documentation

## 2014-06-21 DIAGNOSIS — K4090 Unilateral inguinal hernia, without obstruction or gangrene, not specified as recurrent: Secondary | ICD-10-CM | POA: Insufficient documentation

## 2014-06-21 DIAGNOSIS — Z79899 Other long term (current) drug therapy: Secondary | ICD-10-CM | POA: Insufficient documentation

## 2014-06-21 DIAGNOSIS — Z8781 Personal history of (healed) traumatic fracture: Secondary | ICD-10-CM | POA: Insufficient documentation

## 2014-06-21 DIAGNOSIS — Z880 Allergy status to penicillin: Secondary | ICD-10-CM | POA: Insufficient documentation

## 2014-06-21 MED ORDER — OXYCODONE-ACETAMINOPHEN 5-325 MG PO TABS: 1.0000 | ORAL_TABLET | ORAL | Status: DC | PRN

## 2014-06-21 MED ORDER — OXYCODONE-ACETAMINOPHEN 5-325 MG PO TABS
2.0000 | ORAL_TABLET | Freq: Once | ORAL | Status: AC
  Administered 2014-06-21: 2 via ORAL
  Filled 2014-06-21: qty 2

## 2014-06-21 NOTE — ED Notes (Signed)
Pt has RLQ hernia pain that has been ongoing. Pt states that he has filled out all the hardship paperwork but cant find a surgeon to repair due to not having insurance.

## 2014-06-21 NOTE — ED Provider Notes (Signed)
CSN: [id number]     Arrival date & time 06/21/14  4098 History   First MD Initiated Contact with Patient 06/21/14 [phone]     Chief Complaint  Patient presents with  . Hernia     (Consider location/radiation/quality/duration/timing/severity/associated sxs/prior Treatment) The history is provided by the patient and medical records.   This is 53 y.o. M with past medical history significant for hypertension, diabetes, bilateral inguinal hernias, presenting to the ED for persistent right inguinal pain. Patient had a prior right hernia repair by Dr. Ezzard Standing with mesh. He states over the years that has started protruding through the mesh which is now causing recurrent inguinal pain.  States pain is constant, was unable to sleep last night due to pain. Hernias were confirmed by CT's on 03/06/14 and 05/15/14.  He has been having regular bowel movements and is able to freely pass flatus.  No nausea, vomiting, diarrhea.  No urinary sx.  No fever or chills.  He states he has a new job, but is not eligible for insurance at this time.  The patient states he has attempted follow with [facility] multiple times, however due to lack of insurance no one is willing to operate. He also states he is currently in the process of trying to schedule an appointment at [facility], but is waiting to hear back from the financial department before they will give appt time.  States his job will not allow him to return to work until his issues have been addressed due to pain limiting his ability to perform required duties.  Past Medical History  Diagnosis Date  . Hypertension   . Humerus fracture     right  . Diabetes mellitus   . Inguinal hernia    Past Surgical History  Procedure Laterality Date  . Hernia repair     Family History  Problem Relation Age of Onset  . Diabetes Mother   . Hypertension Mother   . Diabetes Father   . Hypertension Father    History  Substance Use Topics  .  Smoking status: Current Every Day Smoker    Types: Cigarettes  . Smokeless tobacco: Never Used  . Alcohol Use: Yes    Review of Systems  Gastrointestinal:       Inguinal hernia  All other systems reviewed and are negative.     Allergies  Oxycontin; Penicillins; and Tramadol  Home Medications   Prior to Admission medications   Medication Sig Start Date End Date Taking? Authorizing Provider  cholecalciferol (VITAMIN D) 1000 UNITS tablet Take 1,000 Units by mouth daily.   Yes Historical Provider, MD  Fish Oil-Cholecalciferol (FISH OIL + D3) 1000-1000 MG-UNIT CAPS Take 1 capsule by mouth daily with breakfast.    Yes Historical Provider, MD  ibuprofen (ADVIL,MOTRIN) 200 MG tablet Take 800 mg by mouth every 6 (six) hours as needed for mild pain or moderate pain.   Yes Historical Provider, MD  lisinopril-hydrochlorothiazide (PRINZIDE,ZESTORETIC) 20-25 MG per tablet Take 1 tablet by mouth daily.    Yes Historical Provider, MD  magnesium oxide (MAG-OX) 400 MG tablet Take 800 mg by mouth at bedtime.    Yes Historical Provider, MD  metFORMIN (GLUCOPHAGE) 500 MG tablet Take 500 mg by mouth daily.    Yes Historical Provider, MD  oxyCODONE-acetaminophen (PERCOCET/ROXICET) 5-325 MG per tablet Take 1-2 tablets by mouth every 8 (eight) hours as needed for severe pain. 06/12/14  Yes [name] Irine Seal, PA-C  PASS[facility] [place]-[facility] PO Take 1  capsule by mouth 2 (two) times daily.   Yes Historical Provider, MD  promethazine (PHENERGAN) 25 MG tablet Take 1 tablet (25 mg total) by mouth every 6 (six) hours as needed for nausea or vomiting. 06/12/14  Yes [name] Irine SealG Greene, PA-C  vitamin C (ASCORBIC ACID) 500 MG tablet Take 500 mg by mouth daily.   Yes Historical Provider, MD   BP 169/104  Pulse 81  Temp(Src) 97.9 F (36.6 C) (Oral)  Resp 18  SpO2 99%  Physical Exam  Nursing note and vitals reviewed. Constitutional: He is oriented to person, place, and time. He appears well-developed and well-nourished.   HENT:  Head: Normocephalic and atraumatic.  Mouth/Throat: Oropharynx is clear and moist.  Eyes: Conjunctivae and EOM are normal. Pupils are equal, round, and reactive to light.  Neck: Normal range of motion.  Cardiovascular: Normal rate, regular rhythm and normal heart sounds.   Pulmonary/Chest: Effort normal and breath sounds normal.  Abdominal: Soft. Bowel sounds are normal. There is no tenderness. There is no guarding.  Abdomen soft, non-distended, no focal tenderness or peritoneal signs  Genitourinary: Testes normal and penis normal. Uncircumcised.  TTP of right inguinal area with small hernia appreciated; easily reducible; no surrounding erythema, induration, warmth to touch, or signs of cellulitis No testicular or penile pain  Musculoskeletal: Normal range of motion.  Neurological: He is alert and oriented to person, place, and time.  Skin: Skin is warm and dry.  Psychiatric: He has a normal mood and affect.    ED Course  Procedures (including critical care time) Labs Review Labs Reviewed - No data to display  Imaging Review No results found.   EKG Interpretation None      MDM   Final diagnoses:  Inguinal hernia, right   53 y.o. M with known bilateral inguinal hernias, with right hernia pain.  This has been an ongoing issue for several months.  On exam today, small hernia appreciated that is easily reducible.  No signs of necrosis or infectious processes.  Do not feel emergent intervention by general surgery needed at this time. Will give pain control.  Continue attempting to FU with surgery.  Discussed plan with patient, he/she acknowledged understanding and agreed with plan of care.  Return precautions given for new or worsening symptoms.  Garlon HatchetLisa M Sanders, PA-C 06/21/14 1036

## 2014-06-21 NOTE — Discharge Instructions (Signed)
Take the prescribed medication as directed. Continue trying to follow-up with [facility] for surgical intervention. Return to the ED for new or worsening symptoms.

## 2014-06-21 NOTE — ED Provider Notes (Signed)
Medical screening examination/treatment/procedure(s) were performed by non-physician practitioner and as supervising physician I was immediately available for consultation/collaboration.   EKG Interpretation None        Amelya Mabry M Allien Melberg, MD 06/21/14 1059 

## 2014-06-25 DIAGNOSIS — E119 Type 2 diabetes mellitus without complications: Secondary | ICD-10-CM | POA: Insufficient documentation

## 2014-07-03 DIAGNOSIS — R399 Unspecified symptoms and signs involving the genitourinary system: Secondary | ICD-10-CM | POA: Insufficient documentation

## 2014-07-03 DIAGNOSIS — R310 Gross hematuria: Secondary | ICD-10-CM | POA: Insufficient documentation

## 2014-07-05 ENCOUNTER — Emergency Department (HOSPITAL_COMMUNITY)
Admission: EM | Admit: 2014-07-05 | Discharge: 2014-07-05 | Disposition: A | Discharge status: Home / Self Care | Payer: BC Managed Care – PPO | Attending: Emergency Medicine | Admitting: Emergency Medicine

## 2014-07-05 DIAGNOSIS — Z880 Allergy status to penicillin: Secondary | ICD-10-CM | POA: Insufficient documentation

## 2014-07-05 DIAGNOSIS — K4020 Bilateral inguinal hernia, without obstruction or gangrene, not specified as recurrent: Secondary | ICD-10-CM

## 2014-07-05 DIAGNOSIS — I10 Essential (primary) hypertension: Secondary | ICD-10-CM | POA: Insufficient documentation

## 2014-07-05 DIAGNOSIS — F17200 Nicotine dependence, unspecified, uncomplicated: Secondary | ICD-10-CM | POA: Insufficient documentation

## 2014-07-05 DIAGNOSIS — K4090 Unilateral inguinal hernia, without obstruction or gangrene, not specified as recurrent: Secondary | ICD-10-CM | POA: Insufficient documentation

## 2014-07-05 DIAGNOSIS — Z791 Long term (current) use of non-steroidal anti-inflammatories (NSAID): Secondary | ICD-10-CM | POA: Insufficient documentation

## 2014-07-05 DIAGNOSIS — Z8781 Personal history of (healed) traumatic fracture: Secondary | ICD-10-CM | POA: Insufficient documentation

## 2014-07-05 DIAGNOSIS — Z79899 Other long term (current) drug therapy: Secondary | ICD-10-CM | POA: Insufficient documentation

## 2014-07-05 DIAGNOSIS — E119 Type 2 diabetes mellitus without complications: Secondary | ICD-10-CM | POA: Insufficient documentation

## 2014-07-05 LAB — CBG MONITORING, ED: GLUCOSE-CAPILLARY: 363 mg/dL — AB (ref 70–99)

## 2014-07-05 MED ORDER — IBUPROFEN 800 MG PO TABS: 800.0000 mg | ORAL_TABLET | Freq: Three times a day (TID) | ORAL | Status: DC

## 2014-07-05 MED ORDER — OXYCODONE-ACETAMINOPHEN 5-325 MG PO TABS
2.0000 | ORAL_TABLET | Freq: Once | ORAL | Status: AC
  Administered 2014-07-05: 2 via ORAL
  Filled 2014-07-05: qty 2

## 2014-07-05 NOTE — ED Provider Notes (Signed)
Medical screening examination/treatment/procedure(s) were performed by non-physician practitioner and as supervising physician I was immediately available for consultation/collaboration.   EKG Interpretation None        Megan E Docherty, MD 07/05/14 1920 

## 2014-07-05 NOTE — Discharge Instructions (Signed)
Please follow up with your primary care physician in 1-2 days. If you do not have one please call the [facility] number listed above. Please have your surgeon or primary care physician prescribe any long term pain medication. Please read all discharge instructions and return precautions.    Inguinal Hernia, Adult Muscles help keep everything in the body in its proper place. But if a weak spot in the muscles develops, something can poke through. That is called a hernia. When this happens in the lower part of the belly (abdomen), it is called an inguinal hernia. (It takes its name from a part of the body in this region called the inguinal canal.) A weak spot in the wall of muscles lets some fat or part of the small intestine bulge through. An inguinal hernia can develop at any age. Men get them more often than women. CAUSES  In adults, an inguinal hernia develops over time.  It can be triggered by:  Suddenly straining the muscles of the lower abdomen.  Lifting heavy objects.  Straining to have a bowel movement. Difficult bowel movements (constipation) can lead to this.  Constant coughing. This may be caused by smoking or lung disease.  Being overweight.  Being pregnant.  Working at a job that requires long periods of standing or heavy lifting.  Having had an inguinal hernia before. One type can be an emergency situation. It is called a strangulated inguinal hernia. It develops if part of the small intestine slips through the weak spot and cannot get back into the abdomen. The blood supply can be cut off. If that happens, part of the intestine may die. This situation requires emergency surgery. SYMPTOMS  Often, a small inguinal hernia has no symptoms. It is found when a healthcare provider does a physical exam. Larger hernias usually have symptoms.   In adults, symptoms may include:  A lump in the groin. This is easier to see when the person is standing. It might  disappear when lying down.  In men, a lump in the scrotum.  Pain or burning in the groin. This occurs especially when lifting, straining or coughing.  A dull ache or feeling of pressure in the groin.  Signs of a strangulated hernia can include:  A bulge in the groin that becomes very painful and tender to the touch.  A bulge that turns red or purple.  Fever, nausea and vomiting.  Inability to have a bowel movement or to pass gas. DIAGNOSIS  To decide if you have an inguinal hernia, a healthcare provider will probably do a physical examination.  This will include asking questions about any symptoms you have noticed.  The healthcare provider might feel the groin area and ask you to cough. If an inguinal hernia is felt, the healthcare provider may try to slide it back into the abdomen.  Usually no other tests are needed. TREATMENT  Treatments can vary. The size of the hernia makes a difference. Options include:  Watchful waiting. This is often suggested if the hernia is small and you have had no symptoms.  No medical procedure will be done unless symptoms develop.  You will need to watch closely for symptoms. If any occur, contact your healthcare provider right away.  Surgery. This is used if the hernia is larger or you have symptoms.  Open surgery. This is usually an outpatient procedure (you will not stay overnight in a hospital). An cut (incision) is made through the skin in the  groin. The hernia is put back inside the abdomen. The weak area in the muscles is then repaired by herniorrhaphy or hernioplasty. Herniorrhaphy: in this type of surgery, the weak muscles are sewn back together. Hernioplasty: a patch or mesh is used to close the weak area in the abdominal wall.  Laparoscopy. In this procedure, a surgeon makes small incisions. A thin tube with a tiny video camera (called a laparoscope) is put into the abdomen. The surgeon repairs the hernia with mesh by looking with the  video camera and using two long instruments. HOME CARE INSTRUCTIONS   After surgery to repair an inguinal hernia:  You will need to take pain medicine prescribed by your healthcare provider. Follow all directions carefully.  You will need to take care of the wound from the incision.  Your activity will be restricted for awhile. This will probably include no heavy lifting for several weeks. You also should not do anything too active for a few weeks. When you can return to work will depend on the type of job that you have.  During "watchful waiting" periods, you should:  Maintain a healthy weight.  Eat a diet high in fiber (fruits, vegetables and whole grains).  Drink plenty of fluids to avoid constipation. This means drinking enough water and other liquids to keep your urine clear or pale yellow.  Do not lift heavy objects.  Do not stand for long periods of time.  Quit smoking. This should keep you from developing a frequent cough. SEEK MEDICAL CARE IF:   A bulge develops in your groin area.  You feel pain, a burning sensation or pressure in the groin. This might be worse if you are lifting or straining.  You develop a fever of more than 100.5 F (38.1 C). SEEK IMMEDIATE MEDICAL CARE IF:   Pain in the groin increases suddenly.  A bulge in the groin gets bigger suddenly and does not go down.  For men, there is sudden pain in the scrotum. Or, the size of the scrotum increases.  A bulge in the groin area becomes red or purple and is painful to touch.  You have nausea or vomiting that does not go away.  You feel your heart beating much faster than normal.  You cannot have a bowel movement or pass gas.  You develop a fever of more than 102.0 F (38.9 C). Document Released: 04/17/2009 Document Revised: 02/21/2012 Document Reviewed: 04/17/2009 [facility] Patient Information 2015 [place], [place]. This information is not intended to replace advice given to you by your health  care provider. Make sure you discuss any questions you have with your health care provider.

## 2014-07-05 NOTE — Progress Notes (Signed)
[facility] did not get to see patient but will be sending information about [facility] to help patient establish primary care, using the address provided.  °

## 2014-07-05 NOTE — ED Notes (Signed)
PA at bedside.

## 2014-07-05 NOTE — ED Provider Notes (Signed)
CSN: [id number]     Arrival date & time 07/05/14  0850 History   First MD Initiated Contact with Patient 07/05/14 [phone]     Chief Complaint  Patient presents with  . hernia pain      (Consider location/radiation/quality/duration/timing/severity/associated sxs/prior Treatment) HPI Comments: Patient is a 53 year old male past medical history significant for hypertension, diabetes, bilateral inguinal hernias, presenting to the ED for continued right inguinal pain. Patient had a prior right hernia repair by Dr. Ezzard Standing with mesh. He states over the years the mesh has a small defect causing recurrent hernia.  Hernias were confirmed by CT's on 03/06/14 and 05/15/14.  He has been having regular bowel movements and is able to freely pass flatus.  He is due to see the general surgeon at [facility] on August 3 for hernia. Patient was unable to obtain pain medicine from urology at [facility]. He has been unable to obtain a pain prescription from his primary care physician. No nausea, vomiting, diarrhea.  No urinary sx.  No fever or chills.  He has been taking his metformin as prescribed.   Past Medical History  Diagnosis Date  . Hypertension   . Humerus fracture     right  . Diabetes mellitus   . Inguinal hernia    Past Surgical History  Procedure Laterality Date  . Hernia repair     Family History  Problem Relation Age of Onset  . Diabetes Mother   . Hypertension Mother   . Diabetes Father   . Hypertension Father    History  Substance Use Topics  . Smoking status: Current Every Day Smoker    Types: Cigarettes  . Smokeless tobacco: Never Used  . Alcohol Use: Yes    Review of Systems  Constitutional: Negative for fever and chills.  Genitourinary:       Inguinal hernia  All other systems reviewed and are negative.     Allergies  Oxycontin; Penicillins; and Tramadol  Home Medications   Prior to Admission medications   Medication Sig Start Date End Date Taking?  Authorizing Provider  cholecalciferol (VITAMIN D) 1000 UNITS tablet Take 1,000 Units by mouth daily.    Historical Provider, MD  Fish Oil-Cholecalciferol (FISH OIL + D3) 1000-1000 MG-UNIT CAPS Take 1 capsule by mouth daily with breakfast.     Historical Provider, MD  ibuprofen (ADVIL,MOTRIN) 200 MG tablet Take 800 mg by mouth every 6 (six) hours as needed for mild pain or moderate pain.    Historical Provider, MD  ibuprofen (ADVIL,MOTRIN) 800 MG tablet Take 1 tablet (800 mg total) by mouth 3 (three) times daily. 07/05/14   Vickee Mormino L Keiry Kowal, PA-C  lisinopril-hydrochlorothiazide (PRINZIDE,ZESTORETIC) 20-25 MG per tablet Take 1 tablet by mouth daily.     Historical Provider, MD  magnesium oxide (MAG-OX) 400 MG tablet Take 800 mg by mouth at bedtime.     Historical Provider, MD  metFORMIN (GLUCOPHAGE) 500 MG tablet Take 500 mg by mouth daily.     Historical Provider, MD  oxyCODONE-acetaminophen (PERCOCET/ROXICET) 5-325 MG per tablet Take 1 tablet by mouth every 4 (four) hours as needed. 06/21/14   Garlon Hatchet, PA-C  PASSION FLOWER-VALERIAN PO Take 1 capsule by mouth 2 (two) times daily.    Historical Provider, MD  promethazine (PHENERGAN) 25 MG tablet Take 1 tablet (25 mg total) by mouth every 6 (six) hours as needed for nausea or vomiting. 06/12/14   [name] Irine Seal, PA-C  vitamin C (ASCORBIC ACID) 500 MG tablet  Take 500 mg by mouth daily.    Historical Provider, MD   BP 159/94  Pulse 103  Temp(Src) 98 F (36.7 C) (Oral)  Resp 18  SpO2 98% Physical Exam  Nursing note reviewed. Constitutional: He is oriented to person, place, and time. He appears well-developed and well-nourished. No distress.  HENT:  Head: Normocephalic and atraumatic.  Right Ear: External ear normal.  Left Ear: External ear normal.  Nose: Nose normal.  Eyes: Conjunctivae are normal.  Neck: Neck supple.  Cardiovascular: Normal rate, regular rhythm and normal heart sounds.   Pulmonary/Chest: Effort normal and breath  sounds normal.  Abdominal: Soft. Bowel sounds are normal. There is no tenderness. A hernia is present. Hernia confirmed positive in the right inguinal area. Hernia confirmed negative in the left inguinal area.  Genitourinary: Testes normal and penis normal.  TTP of right inguinal area with small hernia appreciated; easily reducible; no surrounding erythema, induration, warmth to touch, or signs of cellulitis    Musculoskeletal:  MAE x 4  Lymphadenopathy:       Right: No inguinal adenopathy present.       Left: No inguinal adenopathy present.  Neurological: He is alert and oriented to person, place, and time.  Skin: Skin is warm and dry. He is not diaphoretic.    ED Course  Procedures (including critical care time) Medications  oxyCODONE-acetaminophen (PERCOCET/ROXICET) 5-325 MG per tablet 2 tablet (2 tablets Oral Given 07/05/14 0954)    Labs Review Labs Reviewed  CBG MONITORING, ED - Abnormal; Notable for the following:    Glucose-Capillary 363 (*)    All other components within normal limits    Imaging Review No results found.   EKG Interpretation None      MDM   Final diagnoses:  Bilateral inguinal hernia without obstruction or gangrene, recurrence not specified    Filed Vitals:   07/05/14 0854  BP: 159/94  Pulse: 103  Temp: 98 F (36.7 C)  Resp: 18   Afebrile, NAD, non-toxic appearing, AAOx4.   Patient with known bilateral inguinal hernias, with right hernia pain at today's visit. This has been a chronic issue. Today small hernia appreciated that is easily reducible. No signs of necrosis or infectious processes. Do not feel emergent intervention by general surgery needed at this point. Treated pain in ED, discussed chronic pain management needed to be addressed by PCP or general surgeon. Return precautions discussed. Patient is agreeable to plan. Patient is stable at time of discharge    Jeannetta EllisJennifer L Korie Streat, PA-C 07/05/14 1510

## 2014-07-05 NOTE — ED Notes (Signed)
Pt requesting blood sugar be checked

## 2014-07-05 NOTE — ED Notes (Signed)
Pt seen multiple times in ED for right hernia pain. Pt reports hx of hernia surgery in 2009. Was seen in ED 7/10 for hernia pain. [facility] and [facility]. Pt reports bladder does not hurt, reports pain from hernia 7/10 at present. [facility] not [facility] pain meds related to hernia pain.

## 2014-08-01 DIAGNOSIS — Z720 Tobacco use: Secondary | ICD-10-CM | POA: Insufficient documentation

## 2014-08-01 DIAGNOSIS — I10 Essential (primary) hypertension: Secondary | ICD-10-CM | POA: Insufficient documentation

## 2014-08-08 NOTE — Unmapped External Note (Signed)
 DATE: 08/08/2014   ATTENDING PHYSICIAN: Rhae Loges, MD  RESIDENT PHYSICIAN: Alm Irwin, MD.   PREOPERATIVE DIAGNOSIS: bladder mass   POSTOPERATIVE DIAGNOSIS: same   PROCEDURES PERFORMED:  1. Pancystourethroscopy.  2. Bilateral retrograde pyelography,  3. Intraoperative fluoroscopy with interpretation < 1 hour 4. TURBT  - small 5. Right ureteroscopy  ANESTHESIA: General.   ESTIMATED BLOOD LOSS: Minimal.   SPECIMENS: Bladder Tumor  DRAINS: None.   COMPLICATIONS: None.   FINDINGS:  1) Phimotic foreskin with inflammed and balanitic appearing glans, normal urethra 2) trilobar hypertrophy 3) 1cm papillary appearing tumor growing on ureteral orifice on right, not seen involving ureter.  4) Right ureter tight, no tumors or strictures.   Retrograde pyelogram: Left: smooth ureter without filling defects, no hydronephrosis or filling defects, small renal pelvis with upper pole connected by long thin infundibulum Right: smooth ureter, distal ureter with area not opacified by contrast, although contrast traversed into proximal ureter. No hydronephrosis seen, calyces branched off small pelvis through narrow infundibuli.    ASA: 3  Antibiotics: Rocephin  INDICATIONS FOR PROCEDURE: Justin Landry  is a 53 y.o. patient who had a previous history of right bladder tumor.  The patient presents back to the operating room today for resection of the bladder mass.   PROCEDURE IN DETAIL: After obtaining informed consent, the patient was brought back to the operative suite where a time out was performed to identify the correct patient, position, and procedure. The patient was placed on the operative table in supine position and general anesthesia was induced. Appropriate preoperative antibiotics were administered. The patient was then placed in the dorsal lithotomy position. Pressure points were appropriately padded. A preparation time out was performed to identify the correct patient,  position, and procedure. The patient was prepped and draped in the usual sterile fashion.  A final incision time out was performed to identify the correct patient, position, and procedure.   Using a rigid 22-French cystoscope with a 30-degree offset lens, the patient's urethra was cannulated. Upon entering the patients bladder we noted the right sided papillary lesion approximately 1 cm in size growing out of the superior aspect of ureteral orifice, but not involving the ureter itself.  The bilateral ureteral orifices were identified in correct orthotopic position, effluxing clear yellow urine.  We used a 70 degree lens to assure no anterior or dome lesions were present.   At this point using a 6 french open ended ureteral catheter the left ureteral orifice was intubated.  Gentle retrograde instillation of contrast demonstrated no filling defects throughout the entirety of the ureter or in the pelvicaliceal system.  We then turned our attention to the right side and using the 6 french open ended ureteral catheter the left ureteral orifice was intubated.  Gentle retrograde instillation of contrast did demonstrated a distal filling defect. We were able to advance the wire and end hole through this area without difficulty. The wire was left in place as we moved on to the resection.     After this, we removed the 22 fr sheath and placed a 26 french resectoscope into the patient's bladder. The papillary lesion was easily resected off the urothelium with superficial swipes. The bed and surrounding tissues were cauterized. We did take a biopsy of this area to obtain adequate tissue.   At this point, the chips were emptied from the patient's bladder and sent for pathology. Given the filling defect, we performed semi-rigid ureteroscopy. The distal ureter was narrow but no concerning lesions were  seen. We removed the scope and wire. A 35F foley was placed and secured. This terminated the procedure.   The patient was  awoken from anesthesia and the patient tolerated the procedure well.   Please note that Dr Herb was present for and participated in all aspects of the procedure.   PLAN OF CARE:  Patient status: Good - Going Home  Standard or non-standard post op care: Standard - Normal post-operatice course expected  Anticipated length of stay: < 24 hours  Pain control: po/IV  Diet: regular diet  Imaging: none  Labs: follow up on pathology  High risk meds: Antibiotics - cipro for catheter pull  Lines, dressings, and drains: none  Foley catheter: To be removed within 48 hours  Special considerations: none  Vulnerable population: none  Follow up: 2 weeks in clinic for path review.

## 2014-08-23 NOTE — Progress Notes (Signed)
 I visited and examined the patient and agree with the above. Electronically signed by: Rhae Loges, MD 08/23/2014 4:12 PM   Electronically signed by: Rhae Loges, MD 08/23/14 [phone]

## 2014-09-27 DIAGNOSIS — K4091 Unilateral inguinal hernia, without obstruction or gangrene, recurrent: Secondary | ICD-10-CM | POA: Insufficient documentation

## 2014-10-16 DIAGNOSIS — K5730 Diverticulosis of large intestine without perforation or abscess without bleeding: Secondary | ICD-10-CM | POA: Insufficient documentation

## 2014-10-30 ENCOUNTER — Emergency Department (HOSPITAL_COMMUNITY)
Admission: EM | Admit: 2014-10-30 | Discharge: 2014-10-30 | Disposition: A | Discharge status: Home / Self Care | Payer: Self-pay | Attending: Emergency Medicine | Admitting: Emergency Medicine

## 2014-10-30 ENCOUNTER — Encounter (HOSPITAL_COMMUNITY): Payer: Self-pay | Admitting: Emergency Medicine

## 2014-10-30 ENCOUNTER — Emergency Department (HOSPITAL_COMMUNITY): Payer: BC Managed Care – PPO

## 2014-10-30 DIAGNOSIS — Z880 Allergy status to penicillin: Secondary | ICD-10-CM | POA: Insufficient documentation

## 2014-10-30 DIAGNOSIS — I10 Essential (primary) hypertension: Secondary | ICD-10-CM | POA: Insufficient documentation

## 2014-10-30 DIAGNOSIS — Z8719 Personal history of other diseases of the digestive system: Secondary | ICD-10-CM | POA: Insufficient documentation

## 2014-10-30 DIAGNOSIS — Z720 Tobacco use: Secondary | ICD-10-CM | POA: Insufficient documentation

## 2014-10-30 DIAGNOSIS — R079 Chest pain, unspecified: Secondary | ICD-10-CM

## 2014-10-30 DIAGNOSIS — Z79899 Other long term (current) drug therapy: Secondary | ICD-10-CM | POA: Insufficient documentation

## 2014-10-30 DIAGNOSIS — Z791 Long term (current) use of non-steroidal anti-inflammatories (NSAID): Secondary | ICD-10-CM | POA: Insufficient documentation

## 2014-10-30 DIAGNOSIS — E1165 Type 2 diabetes mellitus with hyperglycemia: Secondary | ICD-10-CM | POA: Insufficient documentation

## 2014-10-30 DIAGNOSIS — R0789 Other chest pain: Secondary | ICD-10-CM | POA: Insufficient documentation

## 2014-10-30 LAB — BASIC METABOLIC PANEL
ANION GAP: 18 — AB (ref 5–15)
BUN: 18 mg/dL (ref 6–23)
CO2: 19 meq/L (ref 19–32)
Calcium: 9.9 mg/dL (ref 8.4–10.5)
Chloride: 91 mEq/L — ABNORMAL LOW (ref 96–112)
Creatinine, Ser: 0.7 mg/dL (ref 0.50–1.35)
GFR calc non Af Amer: >90 mL/min (ref >90)
Glucose, Bld: 362 mg/dL — ABNORMAL HIGH (ref 70–99)
Potassium: 4.2 mEq/L (ref 3.7–5.3)
Sodium: 128 mEq/L — ABNORMAL LOW (ref 137–147)

## 2014-10-30 LAB — CBC
HCT: 47 % (ref 39.0–52.0)
Hemoglobin: 16.9 g/dL (ref 13.0–17.0)
MCH: 31.6 pg (ref 26.0–34.0)
MCHC: 36 g/dL (ref 30.0–36.0)
MCV: 88 fL (ref 78.0–100.0)
Platelets: 249 10*3/uL (ref 150–400)
RBC: 5.34 MIL/uL (ref 4.22–5.81)
RDW: 12.6 % (ref 11.5–15.5)
WBC: 10.6 10*3/uL — AB (ref 4.0–10.5)

## 2014-10-30 LAB — I-STAT TROPONIN, ED: Troponin i, poc: 0.01 ng/mL (ref 0.00–0.08)

## 2014-10-30 LAB — PRO B NATRIURETIC PEPTIDE: PRO B NATRI PEPTIDE: 16.1 pg/mL (ref 0–125)

## 2014-10-30 MED ORDER — NAPROXEN 375 MG PO TABS: 375.0000 mg | ORAL_TABLET | Freq: Two times a day (BID) | ORAL | Status: DC | PRN

## 2014-10-30 MED ORDER — KETOROLAC TROMETHAMINE 15 MG/ML IJ SOLN
15.0000 mg | Freq: Once | INTRAMUSCULAR | Status: AC
Start: 2014-10-30 — End: 2014-10-30
  Administered 2014-10-30: 15 mg via INTRAVENOUS
  Filled 2014-10-30: qty 1

## 2014-10-30 NOTE — Discharge Instructions (Signed)
If you were given medicines take as directed.  If you are on coumadin or contraceptives realize their levels and effectiveness is altered by many different medicines.  If you have any reaction (rash, tongues swelling, other) to the medicines stop taking and see a physician.   Discuss diabetes management with her primary doctor as he may require medicine adjustment. Watch your diet closely and check glucose regularly.  Please follow up as directed and return to the ER or see a physician for new or worsening symptoms.  Thank you. Filed Vitals:   10/30/14 0722  BP: 154/91  Pulse: 88  Resp: 15  SpO2: 98%

## 2014-10-30 NOTE — ED Provider Notes (Signed)
CSN: [id number]     Arrival date & time 10/30/14  0714 History   First MD Initiated Contact with Patient 10/30/14 [phone]     Chief Complaint  Patient presents with  . Chest Pain     (Consider location/radiation/quality/duration/timing/severity/associated sxs/prior Treatment) HPI Comments: 53 -year-oldmale with history of high blood pressure,ddiabetes, smoking, family history of cardiac disease presents with right-sided chest ache/sharp sensation worse and only with palpation and movement. This is gradually worsened since Monday, on Sunday patient decided to lift the lawnmower and since then has had this pain. Patient's had this in the past with musculoskeletal lifting/injury. Patient is not having known heart or blood clot history, no recent surgeries, no hemoptysis, no long travel, no unilateral leg symptoms, no active cancer. Patient has no exertional symptoms with walking. No diaphoresis. Non radiating.  Patient is a 53 y.o. male presenting with chest pain. The history is provided by the patient.  Chest Pain Associated symptoms: no abdominal pain, no back pain, no fever, no headache, no shortness of breath and not vomiting     Past Medical History  Diagnosis Date  . Hypertension   . Humerus fracture     right  . Diabetes mellitus   . Inguinal hernia    Past Surgical History  Procedure Laterality Date  . Hernia repair     Family History  Problem Relation Age of Onset  . Diabetes Mother   . Hypertension Mother   . Diabetes Father   . Hypertension Father    History  Substance Use Topics  . Smoking status: Current Every Day Smoker    Types: Cigarettes  . Smokeless tobacco: Never Used  . Alcohol Use: Yes    Review of Systems  Constitutional: Negative for fever and chills.  HENT: Negative for congestion.   Eyes: Negative for visual disturbance.  Respiratory: Negative for shortness of breath.   Cardiovascular: Positive for chest pain. Negative for leg swelling.   Gastrointestinal: Negative for vomiting and abdominal pain.  Genitourinary: Negative for dysuria and flank pain.  Musculoskeletal: Negative for back pain, neck pain and neck stiffness.  Skin: Negative for rash.  Neurological: Negative for syncope, light-headedness and headaches.      Allergies  Morphine and related; Oxycontin; Penicillins; and Tramadol  Home Medications   Prior to Admission medications   Medication Sig Start Date End Date Taking? Authorizing Provider  cholecalciferol (VITAMIN D) 1000 UNITS tablet Take 1,000 Units by mouth daily.   Yes Historical Provider, MD  Cyanocobalamin (VITAMIN B 12 PO) Take 1 tablet by mouth daily.   Yes Historical Provider, MD  Fish Oil-Cholecalciferol (FISH OIL + D3) 1000-1000 MG-UNIT CAPS Take 1 capsule by mouth daily with breakfast.    Yes Historical Provider, MD  gabapentin (NEURONTIN) 300 MG capsule Take 300 mg by mouth 3 (three) times daily.   Yes Historical Provider, MD  ibuprofen (ADVIL,MOTRIN) 200 MG tablet Take 800 mg by mouth every 6 (six) hours as needed for mild pain or moderate pain.   Yes Historical Provider, MD  lisinopril-hydrochlorothiazide (PRINZIDE,ZESTORETIC) 20-25 MG per tablet Take 1 tablet by mouth daily.    Yes Historical Provider, MD  magnesium oxide (MAG-OX) 400 MG tablet Take 800 mg by mouth at bedtime.    Yes Historical Provider, MD  Menthol, Topical Analgesic, (ICY HOT) 16 % LIQD Apply 1 application topically as needed (for pain).   Yes Historical Provider, MD  metFORMIN (GLUCOPHAGE) 500 MG tablet Take 500 mg by mouth daily.    Yes  Historical Provider, MD  promethazine (PHENERGAN) 25 MG tablet Take 1 tablet (25 mg total) by mouth every 6 (six) hours as needed for nausea or vomiting. 06/12/14  Yes [name] Irine SealG Greene, PA-C  vitamin C (ASCORBIC ACID) 500 MG tablet Take 500 mg by mouth daily.   Yes Historical Provider, MD  ibuprofen (ADVIL,MOTRIN) 800 MG tablet Take 1 tablet (800 mg total) by mouth 3 (three) times  daily. Patient not taking: Reported on 10/31/2014 07/05/14   Victorino DikeJennifer L Piepenbrink, PA-C  naproxen (NAPROSYN) 375 MG tablet Take 1 tablet (375 mg total) by mouth 2 (two) times daily as needed. 10/30/14   Enid SkeensJoshua M Ananya Mccleese, MD  oxyCODONE-acetaminophen (PERCOCET/ROXICET) 5-325 MG per tablet Take 1 tablet by mouth every 4 (four) hours as needed. Patient not taking: Reported on 10/31/2014 06/21/14   Garlon HatchetLisa M Sanders, PA-C   BP 137/94 mmHg  Pulse 73  Temp(Src) 97.6 F (36.4 C) (Oral)  Resp 15  SpO2 95% Physical Exam  Constitutional: He is oriented to person, place, and time. He appears well-developed and well-nourished.  HENT:  Head: Normocephalic and atraumatic.  Eyes: Conjunctivae are normal. Right eye exhibits no discharge. Left eye exhibits no discharge.  Neck: Normal range of motion. Neck supple. No tracheal deviation present.  Cardiovascular: Normal rate and regular rhythm.   Pulmonary/Chest: Effort normal and breath sounds normal.  Abdominal: Soft. He exhibits no distension. There is no tenderness. There is no guarding.  Musculoskeletal: He exhibits tenderness. He exhibits no edema.  Patient has significant tenderness to palpation right parasternal region, no ecchymosis or step-off. Pain reproduced with certain positions flexion of the right arm. Equal pulses in upper extremities  Neurological: He is alert and oriented to person, place, and time.  Skin: Skin is warm. No rash noted.  Psychiatric: He has a normal mood and affect.  Nursing note and vitals reviewed.   ED Course  Procedures (including critical care time) Labs Review Labs Reviewed  CBC - Abnormal; Notable for the following:    WBC 10.6 (*)    All other components within normal limits  BASIC METABOLIC PANEL - Abnormal; Notable for the following:    Sodium 128 (*)    Chloride 91 (*)    Glucose, Bld 362 (*)    Anion gap 18 (*)    All other components within normal limits  PRO B NATRIURETIC PEPTIDE  I-STAT TROPOININ, ED     Imaging Review No results found.   EKG Interpretation   Date/Time:  Wednesday October 30 2014 07:20:01 EST Ventricular Rate:  88 PR Interval:  148 QRS Duration: 87 QT Interval:  348 QTC Calculation: 421 R Axis:   169 Text Interpretation:  Sinus rhythm Probable left atrial enlargement Right  axis deviation Abnormal R-wave progression, late transition Confirmed by  Angelika Jerrett  MD, Tasneem Cormier ([id number]) on 10/30/2014 8:14:30 AM      MDM   Final diagnoses:  Right-sided chest pain  Type 2 diabetes mellitus with hyperglycemia   Although patient does have for risk factors for cardiac clinically this is musculoskeletal. Patient is getting screening cardiac troponin EKG based on history of presentation. I reviewed his last kidney function which was okay, Toradol ordered. Close follow-up with primary Dr. Discussed.  Results and differential diagnosis were discussed with the patient/parent/guardian. Close follow up outpatient was discussed, comfortable with the plan.   Medications  ketorolac (TORADOL) 15 MG/ML injection 15 mg (15 mg Intravenous Given 10/30/14 0741)    Filed Vitals:   10/30/14 0748 10/30/14 0800  10/30/14 0815 10/30/14 0851  BP:  138/98 132/87 137/94  Pulse:  76 72 73  Temp: 98.4 F (36.9 C)   97.6 F (36.4 C)  TempSrc: Oral   Oral  Resp:  19 17 15   SpO2:  94% 95% 95%    Final diagnoses:  Right-sided chest pain  Type 2 diabetes mellitus with hyperglycemia        Enid SkeensJoshua M Stormy Sabol, MD 11/03/14 2358

## 2014-10-30 NOTE — ED Notes (Signed)
Pt c/o right sided chest pain that started two days ago. 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 Pt states that pain hurts worse when he takes a deep breath.  Pt does smoke, has HTN and diabetic.

## 2014-10-30 NOTE — ED Notes (Signed)
X-Ray at bedside.

## 2014-10-30 NOTE — ED Notes (Signed)
Pt reports post lifting lawnmower right sided chest pain since Sunday. Pt reports worsens with movement of right arm.

## 2015-05-16 ENCOUNTER — Encounter (HOSPITAL_COMMUNITY): Payer: Self-pay | Admitting: Emergency Medicine

## 2015-05-16 ENCOUNTER — Emergency Department (HOSPITAL_COMMUNITY)
Admission: EM | Admit: 2015-05-16 | Discharge: 2015-05-16 | Disposition: A | Discharge status: Home / Self Care | Payer: BLUE CROSS/BLUE SHIELD | Attending: Emergency Medicine | Admitting: Emergency Medicine

## 2015-05-16 DIAGNOSIS — E119 Type 2 diabetes mellitus without complications: Secondary | ICD-10-CM | POA: Diagnosis not present

## 2015-05-16 DIAGNOSIS — R197 Diarrhea, unspecified: Secondary | ICD-10-CM | POA: Diagnosis not present

## 2015-05-16 DIAGNOSIS — Z8781 Personal history of (healed) traumatic fracture: Secondary | ICD-10-CM | POA: Insufficient documentation

## 2015-05-16 DIAGNOSIS — Z8719 Personal history of other diseases of the digestive system: Secondary | ICD-10-CM | POA: Insufficient documentation

## 2015-05-16 DIAGNOSIS — M791 Myalgia: Secondary | ICD-10-CM | POA: Diagnosis not present

## 2015-05-16 DIAGNOSIS — R109 Unspecified abdominal pain: Secondary | ICD-10-CM | POA: Diagnosis not present

## 2015-05-16 DIAGNOSIS — Z880 Allergy status to penicillin: Secondary | ICD-10-CM | POA: Diagnosis not present

## 2015-05-16 DIAGNOSIS — R112 Nausea with vomiting, unspecified: Secondary | ICD-10-CM | POA: Diagnosis not present

## 2015-05-16 DIAGNOSIS — Z79899 Other long term (current) drug therapy: Secondary | ICD-10-CM | POA: Diagnosis not present

## 2015-05-16 DIAGNOSIS — Z720 Tobacco use: Secondary | ICD-10-CM | POA: Diagnosis not present

## 2015-05-16 DIAGNOSIS — I10 Essential (primary) hypertension: Secondary | ICD-10-CM | POA: Diagnosis not present

## 2015-05-16 LAB — CBC WITH DIFFERENTIAL/PLATELET
Basophils Absolute: 0.1 10*3/uL (ref 0.0–0.1)
Basophils Relative: 1 % (ref 0–1)
EOS ABS: 0.3 10*3/uL (ref 0.0–0.7)
Eosinophils Relative: 3 % (ref 0–5)
HCT: 46.1 % (ref 39.0–52.0)
Hemoglobin: 16.7 g/dL (ref 13.0–17.0)
LYMPHS PCT: 19 % (ref 12–46)
Lymphs Abs: 2.1 10*3/uL (ref 0.7–4.0)
MCH: 31.6 pg (ref 26.0–34.0)
MCHC: 36.2 g/dL — ABNORMAL HIGH (ref 30.0–36.0)
MCV: 87.3 fL (ref 78.0–100.0)
MONOS PCT: 9 % (ref 3–12)
Monocytes Absolute: 1 10*3/uL (ref 0.1–1.0)
NEUTROS ABS: 7.4 10*3/uL (ref 1.7–7.7)
Neutrophils Relative %: 68 % (ref 43–77)
PLATELETS: 274 10*3/uL (ref 150–400)
RBC: 5.28 MIL/uL (ref 4.22–5.81)
RDW: 13 % (ref 11.5–15.5)
WBC: 10.8 10*3/uL — ABNORMAL HIGH (ref 4.0–10.5)

## 2015-05-16 LAB — COMPREHENSIVE METABOLIC PANEL
ALBUMIN: 3.8 g/dL (ref 3.5–5.0)
ALT: 41 U/L (ref 17–63)
ANION GAP: 8 (ref 5–15)
AST: 28 U/L (ref 15–41)
Alkaline Phosphatase: 57 U/L (ref 38–126)
BUN: 13 mg/dL (ref 6–20)
CHLORIDE: 106 mmol/L (ref 101–111)
CO2: 24 mmol/L (ref 22–32)
Calcium: 9.1 mg/dL (ref 8.9–10.3)
Creatinine, Ser: 0.79 mg/dL (ref 0.61–1.24)
GFR calc Af Amer: >60 mL/min (ref >60)
GFR calc non Af Amer: >60 mL/min (ref >60)
Glucose, Bld: 224 mg/dL — ABNORMAL HIGH (ref 65–99)
Potassium: 3.9 mmol/L (ref 3.5–5.1)
Sodium: 138 mmol/L (ref 135–145)
Total Bilirubin: 0.4 mg/dL (ref 0.3–1.2)
Total Protein: 7.1 g/dL (ref 6.5–8.1)

## 2015-05-16 LAB — URINALYSIS, ROUTINE W REFLEX MICROSCOPIC
Bilirubin Urine: NEGATIVE
HGB URINE DIPSTICK: NEGATIVE
Ketones, ur: NEGATIVE mg/dL
LEUKOCYTES UA: NEGATIVE
Nitrite: NEGATIVE
PH: 6 (ref 5.0–8.0)
Protein, ur: NEGATIVE mg/dL
Specific Gravity, Urine: 1.03 (ref 1.005–1.030)
Urobilinogen, UA: 0.2 mg/dL (ref 0.0–1.0)

## 2015-05-16 LAB — URINE MICROSCOPIC-ADD ON

## 2015-05-16 LAB — LIPASE, BLOOD: LIPASE: 31 U/L (ref 22–51)

## 2015-05-16 LAB — CK: Total CK: 102 U/L (ref 49–397)

## 2015-05-16 MED ORDER — ONDANSETRON HCL 4 MG/2ML IJ SOLN
4.0000 mg | Freq: Once | INTRAMUSCULAR | Status: AC
  Administered 2015-05-16: 4 mg via INTRAVENOUS
  Filled 2015-05-16: qty 2

## 2015-05-16 MED ORDER — ONDANSETRON 4 MG PO TBDP: 4.0000 mg | ORAL_TABLET | Freq: Three times a day (TID) | ORAL | Status: DC | PRN

## 2015-05-16 MED ORDER — LOPERAMIDE HCL 2 MG PO CAPS
4.0000 mg | ORAL_CAPSULE | Freq: Once | ORAL | Status: AC
  Administered 2015-05-16: 4 mg via ORAL
  Filled 2015-05-16: qty 2

## 2015-05-16 MED ORDER — SODIUM CHLORIDE 0.9 % IV BOLUS (SEPSIS)
1000.0000 mL | Freq: Once | INTRAVENOUS | Status: AC
  Administered 2015-05-16: 1000 mL via INTRAVENOUS

## 2015-05-16 MED ORDER — LOPERAMIDE HCL 2 MG PO CAPS: 4.0000 mg | ORAL_CAPSULE | ORAL | Status: DC | PRN

## 2015-05-16 NOTE — Discharge Instructions (Signed)

## 2015-05-16 NOTE — ED Notes (Signed)
Patient c/o generalized abd discomfort x3-4 days. Multiple episodes of diarrhea, too numerous to count and 1-2 episodes of emesis this am, several episodes of dry heaving.

## 2015-05-16 NOTE — ED Notes (Signed)
Patient states he is unable to void at this time. Patient was requested to undress and change into hospital gown at this time.

## 2015-05-16 NOTE — ED Notes (Signed)
Pt states for the past 3 days he has been having diarrhea, nausea and vomiting  Pt states he has been unable to keep anything in him  Pt states he feels dehydrated and is sore all over from the vomiting

## 2015-05-16 NOTE — ED Provider Notes (Signed)
CSN: [id number]     Arrival date & time 05/16/15  [id number] History   First MD Initiated Contact with Patient 05/16/15 0500     Chief Complaint  Patient presents with  . Emesis  . Diarrhea     (Consider location/radiation/quality/duration/timing/severity/associated sxs/prior Treatment) HPI  This is a 54 year old male with history of hypertension, diabetes who presents with abdominal pain, nausea, vomiting, and diarrhea. Patient reports onset of symptoms several days ago. He was at work and feels like he got overheated and dehydrated. Since that time he has had multiple episodes of nonbilious, nonbloody emesis, crampy abdominal pain and diarrhea. He states that everything that he eats "goes straight through." Reports diffuse myalgias. Denies any fevers or chills.  Past Medical History  Diagnosis Date  . Hypertension   . Humerus fracture     right  . Diabetes mellitus   . Inguinal hernia    Past Surgical History  Procedure Laterality Date  . Hernia repair     Family History  Problem Relation Age of Onset  . Diabetes Mother   . Hypertension Mother   . Diabetes Father   . Hypertension Father    History  Substance Use Topics  . Smoking status: Current Every Day Smoker    Types: Cigarettes  . Smokeless tobacco: Never Used  . Alcohol Use: Yes     Comment: seldom    Review of Systems  Constitutional: Negative.  Negative for fever.  Respiratory: Negative.  Negative for chest tightness and shortness of breath.   Cardiovascular: Negative.  Negative for chest pain.  Gastrointestinal: Positive for nausea, vomiting, abdominal pain and diarrhea.  Genitourinary: Negative.  Negative for dysuria.  Musculoskeletal: Positive for myalgias. Negative for back pain.  Neurological: Negative for headaches.  All other systems reviewed and are negative.     Allergies  Morphine and related; Oxycontin; and Penicillins  Home Medications   Prior to Admission medications   Medication Sig Start  Date End Date Taking? Authorizing Provider  cholecalciferol (VITAMIN D) 1000 UNITS tablet Take 1,000 Units by mouth daily.   Yes Historical Provider, MD  Cyanocobalamin (VITAMIN B 12 PO) Take 1 tablet by mouth daily.   Yes Historical Provider, MD  Fish Oil-Cholecalciferol (FISH OIL + D3) 1000-1000 MG-UNIT CAPS Take 1 capsule by mouth daily with breakfast.    Yes Historical Provider, MD  gabapentin (NEURONTIN) 300 MG capsule Take 300 mg by mouth 3 (three) times daily.   Yes Historical Provider, MD  ibuprofen (ADVIL,MOTRIN) 200 MG tablet Take 800 mg by mouth every 6 (six) hours as needed for mild pain or moderate pain.   Yes Historical Provider, MD  lisinopril-hydrochlorothiazide (PRINZIDE,ZESTORETIC) 20-25 MG per tablet Take 1 tablet by mouth daily.    Yes Historical Provider, MD  magnesium oxide (MAG-OX) 400 MG tablet Take 800 mg by mouth at bedtime.    Yes Historical Provider, MD  Menthol, Topical Analgesic, (ICY HOT) 16 % LIQD Apply 1 application topically as needed (for pain).   Yes Historical Provider, MD  metFORMIN (GLUCOPHAGE) 500 MG tablet Take 500 mg by mouth daily.    Yes Historical Provider, MD  vitamin C (ASCORBIC ACID) 500 MG tablet Take 500 mg by mouth daily.   Yes Historical Provider, MD  ibuprofen (ADVIL,MOTRIN) 800 MG tablet Take 1 tablet (800 mg total) by mouth 3 (three) times daily. Patient not taking: Reported on 10/31/2014 07/05/14   Francee PiccoloJennifer Piepenbrink, PA-C  loperamide (IMODIUM) 2 MG capsule Take 2 capsules (4 mg total) by  mouth as needed for diarrhea or loose stools. 05/16/15   Shon Baton, MD  naproxen (NAPROSYN) 375 MG tablet Take 1 tablet (375 mg total) by mouth 2 (two) times daily as needed. Patient not taking: Reported on 05/16/2015 10/30/14   Blane Ohara, MD  ondansetron (ZOFRAN ODT) 4 MG disintegrating tablet Take 1 tablet (4 mg total) by mouth every 8 (eight) hours as needed for nausea or vomiting. 05/16/15   Shon Baton, MD  oxyCODONE-acetaminophen  (PERCOCET/ROXICET) 5-325 MG per tablet Take 1 tablet by mouth every 4 (four) hours as needed. Patient not taking: Reported on 10/31/2014 06/21/14   Garlon Hatchet, PA-C  promethazine (PHENERGAN) 25 MG tablet Take 1 tablet (25 mg total) by mouth every 6 (six) hours as needed for nausea or vomiting. Patient not taking: Reported on 05/16/2015 06/12/14   Marlon Pel, PA-C   BP 166/111 mmHg  Pulse 71  Temp(Src) 97.9 F (36.6 C) (Oral)  Resp 18  SpO2 99% Physical Exam  Constitutional: He is oriented to person, place, and time. He appears well-developed and well-nourished. No distress.  HENT:  Head: Normocephalic and atraumatic.  Cardiovascular: Normal rate, regular rhythm and normal heart sounds.   No murmur heard. Pulmonary/Chest: Effort normal and breath sounds normal. No respiratory distress. He has no wheezes.  Abdominal: Soft. Bowel sounds are normal. There is no tenderness. There is no rebound and no guarding.  Musculoskeletal: He exhibits no edema.  Neurological: He is alert and oriented to person, place, and time.  Skin: Skin is warm and dry.  Psychiatric: He has a normal mood and affect.  Nursing note and vitals reviewed.   ED Course  Procedures (including critical care time) Labs Review Labs Reviewed  CBC WITH DIFFERENTIAL/PLATELET - Abnormal; Notable for the following:    WBC 10.8 (*)    MCHC 36.2 (*)    All other components within normal limits  COMPREHENSIVE METABOLIC PANEL - Abnormal; Notable for the following:    Glucose, Bld 224 (*)    All other components within normal limits  URINALYSIS, ROUTINE W REFLEX MICROSCOPIC (NOT AT [facility]) - Abnormal; Notable for the following:    Glucose, UA >1000 (*)    All other components within normal limits  LIPASE, BLOOD  CK  URINE MICROSCOPIC-ADD ON    Imaging Review No results found.   EKG Interpretation None      MDM   Final diagnoses:  Nausea vomiting and diarrhea    Patient presents with nausea, vomiting,  diarrhea, and abdominal pain. Nontoxic on exam. Vital signs are reassuring. Nontender abdominal exam.  Basic labwork obtained. Patient given Imodium, Zofran, fluids. CK normal and no evidence of rhabdomyolysis. Otherwise lab work notable for glucose of 224. Patient was also noted to be hypertensive on admission. Patient states that he has been taking his blood pressure medications but feels like "it's just going through me." He is otherwise asymptomatic. On recheck, patient is comfortable and feels improved. Will discharge with Imodium and Zofran.  After history, exam, and medical workup I feel the patient has been appropriately medically screened and is safe for discharge home. Pertinent diagnoses were discussed with the patient. Patient was given return precautions.     Shon Baton, MD 05/16/15 [phone]

## 2015-05-27 ENCOUNTER — Encounter: Payer: Self-pay | Admitting: Internal Medicine

## 2015-05-27 ENCOUNTER — Ambulatory Visit (INDEPENDENT_AMBULATORY_CARE_PROVIDER_SITE_OTHER): Payer: BLUE CROSS/BLUE SHIELD | Admitting: Internal Medicine

## 2015-05-27 VITALS — BP 152/98 | HR 79 | Temp 98.0°F | Wt 214.0 lb

## 2015-05-27 DIAGNOSIS — I10 Essential (primary) hypertension: Secondary | ICD-10-CM | POA: Insufficient documentation

## 2015-05-27 DIAGNOSIS — Z8719 Personal history of other diseases of the digestive system: Secondary | ICD-10-CM

## 2015-05-27 DIAGNOSIS — G8929 Other chronic pain: Secondary | ICD-10-CM

## 2015-05-27 DIAGNOSIS — E119 Type 2 diabetes mellitus without complications: Secondary | ICD-10-CM

## 2015-05-27 DIAGNOSIS — Z9889 Other specified postprocedural states: Secondary | ICD-10-CM

## 2015-05-27 DIAGNOSIS — R109 Unspecified abdominal pain: Secondary | ICD-10-CM | POA: Diagnosis not present

## 2015-05-27 DIAGNOSIS — R1031 Right lower quadrant pain: Secondary | ICD-10-CM

## 2015-05-27 LAB — COMPREHENSIVE METABOLIC PANEL
ALT: 37 U/L (ref 0–53)
AST: 24 U/L (ref 0–37)
Albumin: 4.3 g/dL (ref 3.5–5.2)
Alkaline Phosphatase: 60 U/L (ref 39–117)
BUN: 14 mg/dL (ref 6–23)
CHLORIDE: 99 meq/L (ref 96–112)
CO2: 30 mEq/L (ref 19–32)
Calcium: 10.1 mg/dL (ref 8.4–10.5)
Creatinine, Ser: 0.96 mg/dL (ref 0.40–1.50)
GFR: 87.21 mL/min (ref >60.00)
Glucose, Bld: 265 mg/dL — ABNORMAL HIGH (ref 70–99)
POTASSIUM: 4 meq/L (ref 3.5–5.1)
SODIUM: 135 meq/L (ref 135–145)
TOTAL PROTEIN: 7.2 g/dL (ref 6.0–8.3)
Total Bilirubin: 0.6 mg/dL (ref 0.2–1.2)

## 2015-05-27 LAB — CBC
HCT: 50.5 % (ref 39.0–52.0)
HEMOGLOBIN: 17.4 g/dL — AB (ref 13.0–17.0)
MCHC: 34.4 g/dL (ref 30.0–36.0)
MCV: 89.3 fl (ref 78.0–100.0)
Platelets: 299 10*3/uL (ref 150.0–400.0)
RBC: 5.66 Mil/uL (ref 4.22–5.81)
RDW: 13.3 % (ref 11.5–15.5)
WBC: 9 10*3/uL (ref 4.0–10.5)

## 2015-05-27 LAB — LIPID PANEL
Cholesterol: 186 mg/dL (ref 0–200)
HDL: 38 mg/dL — AB (ref >39.00)
NONHDL: 148
Total CHOL/HDL Ratio: 5
Triglycerides: 313 mg/dL — ABNORMAL HIGH (ref 0.0–149.0)
VLDL: 62.6 mg/dL — AB (ref 0.0–40.0)

## 2015-05-27 LAB — LDL CHOLESTEROL, DIRECT: LDL DIRECT: 113 mg/dL

## 2015-05-27 LAB — MICROALBUMIN / CREATININE URINE RATIO
CREATININE, U: 65.3 mg/dL
MICROALB/CREAT RATIO: 7.2 mg/g (ref 0.0–30.0)
Microalb, Ur: 4.7 mg/dL — ABNORMAL HIGH (ref 0.0–1.9)

## 2015-05-27 LAB — HEMOGLOBIN A1C: Hgb A1c MFr Bld: 9 % — ABNORMAL HIGH (ref 4.6–6.5)

## 2015-05-27 MED ORDER — GABAPENTIN 300 MG PO CAPS: 300.0000 mg | ORAL_CAPSULE | Freq: Three times a day (TID) | ORAL | Status: DC

## 2015-05-27 NOTE — Patient Instructions (Signed)

## 2015-05-27 NOTE — Assessment & Plan Note (Signed)
Elevated today due to pain Will continue current dose of Lisinopril HCT If remains elevated will add Norvasc CBC and CMET today

## 2015-05-27 NOTE — Progress Notes (Signed)
Pre visit review using our clinic review tool, if applicable. No additional management support is needed unless otherwise documented below in the visit note. 

## 2015-05-27 NOTE — Progress Notes (Addendum)
HPI  Pt presents to the clinic today to establish care and for management of the conditions listed below. He is transferring care from Dr. Vear Clock in [place].  HTN: his BP is elevated today at 152/98. He thinks it is because he is in pain. He does not monitor his blood pressure at home. He denies chest pain, shortness of breath or dizziness. He takes Lisinopril-HCTZ as directed. He denies adverse effects.  DM 2: He can not tell me the last time he had his A1C checked. He has not had flu or pneumovax shots. He has not had a eye exam recently. He does not test his sugars. He takes Metformin 500 mg PO daily. He denies numbness and tingling in his hands or feet.   HLD: He is not sure what his cholesterol is. He does not take any cholesterol lowering medications. He does try to consume a low fat diet. He also takes Fish Oil daily.  His biggest concern is pain in his right groin pain. This has been going on since his inguinal hernia repair. He reports his surgeon told him that scar tissue had likely grown over the mesh causing his pain. He describes the pain and sharp and stabbing. It occurs intermittently but on a daily basis. He is concerned that he may have to quit his job because the pain interferes with his ability to work. He has been sent home secondary to the pain. He works on [employer] and all the jolting and vibrating make the pain worse. He reports he has had a workup at [facility]. He had a cystoscopy, bladder biopsy and colonoscopy, which did not reveal a cause for his pain. He was taking Neurontin which did take the edge off but reports his pain is uncontrolled. He would like a referral to pain management. He was advised that he could not have repeat surgery because he was too "high risk" with his HTN and DM2.  Flu: never Tetanus: 2012 Pneumovax: never PSA Screening: unsure Colon Screening:2015 at [facility] Screening: 2006 Dentist: as needed  Past Medical History  Diagnosis  Date  . Hypertension   . Humerus fracture     right  . Diabetes mellitus   . Inguinal hernia     Current Outpatient Prescriptions  Medication Sig Dispense Refill  . cholecalciferol (VITAMIN D) 1000 UNITS tablet Take 1,000 Units by mouth daily.    . Cyanocobalamin (VITAMIN B 12 PO) Take 1 tablet by mouth daily.    . Fish Oil-Cholecalciferol (FISH OIL + D3) 1000-1000 MG-UNIT CAPS Take 1 capsule by mouth daily with breakfast.     . ibuprofen (ADVIL,MOTRIN) 200 MG tablet Take 800 mg by mouth every 6 (six) hours as needed for mild pain or moderate pain.    Marland Kitchen lisinopril-hydrochlorothiazide (PRINZIDE,ZESTORETIC) 20-25 MG per tablet Take 1 tablet by mouth daily.     . magnesium oxide (MAG-OX) 400 MG tablet Take 800 mg by mouth at bedtime.     . Menthol, Topical Analgesic, (ICY HOT) 16 % LIQD Apply 1 application topically as needed (for pain).    . metFORMIN (GLUCOPHAGE) 500 MG tablet Take 500 mg by mouth daily.     . vitamin C (ASCORBIC ACID) 500 MG tablet Take 500 mg by mouth daily.    Marland Kitchen gabapentin (NEURONTIN) 300 MG capsule Take 300 mg by mouth 3 (three) times daily.    . naproxen (NAPROSYN) 375 MG tablet Take 1 tablet (375 mg total) by mouth 2 (two) times daily as needed. (  Patient not taking: Reported on 05/27/2015) 12 tablet 0   No current facility-administered medications for this visit.    Allergies  Allergen Reactions  . Morphine And Related Nausea And Vomiting and Other (See Comments)    Extreme sweating   . Oxycontin [Oxycodone Hcl] Other (See Comments)    Extreme NAusea and vomitting follows (Patient Tolerates Percocet short acting)  . Penicillins Nausea And Vomiting    Family History  Problem Relation Age of Onset  . Diabetes Mother   . Hypertension Mother   . Diabetes Father   . Hypertension Father   . Cancer Brother     brain    History   Social History  . Marital Status: Single    Spouse Name: N/A  . Number of Children: N/A  . Years of Education: N/A    Occupational History  . Not on file.   Social History Main Topics  . Smoking status: Current Every Day Smoker -- 1.00 packs/day    Types: Cigarettes  . Smokeless tobacco: Never Used  . Alcohol Use: 0.0 oz/week    0 Standard drinks or equivalent per week     Comment: occasional  . Drug Use: No  . Sexual Activity: Yes   Other Topics Concern  . Not on file   Social History Narrative    ROS:  Constitutional: Denies fever, malaise, fatigue, headache or abrupt weight changes.  HEENT: Denies eye pain, eye redness, ear pain, ringing in the ears, wax buildup, runny nose, nasal congestion, bloody nose, or sore throat. Respiratory: Denies difficulty breathing, shortness of breath, cough or sputum production.   Cardiovascular: Denies chest pain, chest tightness, palpitations or swelling in the hands or feet.  Gastrointestinal: Denies abdominal pain, bloating, constipation, diarrhea or blood in the stool.  GU: Pt reports right groin pain. Denies frequency, urgency, pain with urination, blood in urine, odor or discharge. Musculoskeletal: Denies decrease in range of motion, difficulty with gait, muscle pain or joint pain and swelling.  Skin: Denies redness, rashes, lesions or ulcercations.  Neurological: Denies dizziness, difficulty with memory, difficulty with speech or problems with balance and coordination.  Psych: Denies anxiety, depression, SI/HI.  No other specific complaints in a complete review of systems (except as listed in HPI above).  PE:  BP 152/98 mmHg  Pulse 79  Temp(Src) 98 F (36.7 C) (Oral)  Wt 214 lb (97.07 kg)  SpO2 97% Wt Readings from Last 3 Encounters:  05/27/15 214 lb (97.07 kg)  04/10/14 218 lb (98.884 kg)  11/08/13 217 lb (98.431 kg)    General: Appears his stated age, well developed, well nourished in NAD. HEENT: Head: normal shape and size; Eyes: sclera white, no icterus, conjunctiva pink, PERRLA and EOMs intact;  Cardiovascular: Normal rate and  rhythm. S1,S2 noted.  No murmur, rubs or gallops noted.  Pulmonary/Chest: Normal effort and positive vesicular breath sounds. No respiratory distress. No wheezes, rales or ronchi noted.  Abdomen: Soft and nontender. Normal bowel sounds, no bruits noted. No distention or masses noted. Liver, spleen and kidneys non palpable. Neurological: Alert and oriented.  Psychiatric: Mood and affect normal. Behavior is normal. Judgment and thought content normal.    BMET    Component Value Date/Time   NA 138 05/16/2015 0549   NA 134* 03/12/2014 1525   K 3.9 05/16/2015 0549   K 4.4 03/12/2014 1525   CL 106 05/16/2015 0549   CL 100 03/12/2014 1525   CO2 24 05/16/2015 0549   CO2 31 03/12/2014 1525  GLUCOSE 224* 05/16/2015 0549   GLUCOSE 222* 03/12/2014 1525   BUN 13 05/16/2015 0549   BUN 18 03/12/2014 1525   CREATININE 0.79 05/16/2015 0549   CREATININE 1.13 03/12/2014 1525   CALCIUM 9.1 05/16/2015 0549   CALCIUM 9.3 03/12/2014 1525   GFRNONAA >60 05/16/2015 0549   GFRNONAA >60 03/12/2014 1525   GFRAA >60 05/16/2015 0549   GFRAA >60 03/12/2014 1525    Lipid Panel     Component Value Date/Time   CHOL 255* 06/20/2012 1100   TRIG 621* 06/20/2012 1100   HDL 42 06/20/2012 1100   CHOLHDL 6.1 06/20/2012 1100   VLDL UNABLE TO CALCULATE IF TRIGLYCERIDE OVER 400 mg/dL 16/09/9603 5409   LDLCALC UNABLE TO CALCULATE IF TRIGLYCERIDE OVER 400 mg/dL 81/19/1478 2956    CBC    Component Value Date/Time   WBC 10.8* 05/16/2015 0549   WBC 13.9* 03/12/2014 1525   RBC 5.28 05/16/2015 0549   RBC 5.18 03/12/2014 1525   HGB 16.7 05/16/2015 0549   HGB 16.1 03/12/2014 1525   HCT 46.1 05/16/2015 0549   HCT 47.1 03/12/2014 1525   PLT 274 05/16/2015 0549   PLT 253 03/12/2014 1525   MCV 87.3 05/16/2015 0549   MCV 91 03/12/2014 1525   MCH 31.6 05/16/2015 0549   MCH 31.0 03/12/2014 1525   MCHC 36.2* 05/16/2015 0549   MCHC 34.1 03/12/2014 1525   RDW 13.0 05/16/2015 0549   RDW 13.4 03/12/2014 1525    LYMPHSABS 2.1 05/16/2015 0549   LYMPHSABS 4.1* 03/12/2014 1525   MONOABS 1.0 05/16/2015 0549   MONOABS 0.9 03/12/2014 1525   EOSABS 0.3 05/16/2015 0549   EOSABS 0.4 03/12/2014 1525   BASOSABS 0.1 05/16/2015 0549   BASOSABS 0.2* 03/12/2014 1525    Hgb A1C No results found for: HGBA1C   Assessment and Plan:  Chronic right groin pain s/p right inguinal hernia repair:  He request a RX for Percocet or Vicodin, I declined I do not think he is too high risk to remove the mesh I did refill his Neurontin Referral placed for pain management  RTC in 6 months or sooner if needed

## 2015-05-27 NOTE — Assessment & Plan Note (Addendum)
Will repeat A1C, urine microalbumin and Lipid profile today May need to increase his Metformin Encouraged him to work on diet and exercise He declines flu and pneumovax Encouraged him to see a eye doctor annually Foot exam today

## 2015-05-28 MED ORDER — SIMVASTATIN 20 MG PO TABS: 20.0000 mg | ORAL_TABLET | Freq: Every day | ORAL | Status: DC

## 2015-05-28 MED ORDER — METFORMIN HCL 1000 MG PO TABS: 1000.0000 mg | ORAL_TABLET | Freq: Two times a day (BID) | ORAL | Status: DC

## 2015-05-28 NOTE — Addendum Note (Signed)
Addended by: Roena Malady on: 05/28/2015 04:11 PM   Modules accepted: Orders, Medications

## 2015-06-02 ENCOUNTER — Encounter (HOSPITAL_COMMUNITY): Payer: Self-pay | Admitting: Emergency Medicine

## 2015-06-02 ENCOUNTER — Emergency Department (HOSPITAL_COMMUNITY)
Admission: EM | Admit: 2015-06-02 | Discharge: 2015-06-02 | Disposition: A | Discharge status: Home / Self Care | Payer: BLUE CROSS/BLUE SHIELD | Attending: Emergency Medicine | Admitting: Emergency Medicine

## 2015-06-02 DIAGNOSIS — Z79899 Other long term (current) drug therapy: Secondary | ICD-10-CM | POA: Insufficient documentation

## 2015-06-02 DIAGNOSIS — Z8719 Personal history of other diseases of the digestive system: Secondary | ICD-10-CM | POA: Diagnosis not present

## 2015-06-02 DIAGNOSIS — I10 Essential (primary) hypertension: Secondary | ICD-10-CM | POA: Insufficient documentation

## 2015-06-02 DIAGNOSIS — Z880 Allergy status to penicillin: Secondary | ICD-10-CM | POA: Diagnosis not present

## 2015-06-02 DIAGNOSIS — R1031 Right lower quadrant pain: Secondary | ICD-10-CM | POA: Diagnosis not present

## 2015-06-02 DIAGNOSIS — G8929 Other chronic pain: Secondary | ICD-10-CM | POA: Diagnosis not present

## 2015-06-02 DIAGNOSIS — E119 Type 2 diabetes mellitus without complications: Secondary | ICD-10-CM | POA: Diagnosis not present

## 2015-06-02 DIAGNOSIS — Z720 Tobacco use: Secondary | ICD-10-CM | POA: Diagnosis not present

## 2015-06-02 DIAGNOSIS — E780 Pure hypercholesterolemia: Secondary | ICD-10-CM | POA: Diagnosis not present

## 2015-06-02 DIAGNOSIS — R109 Unspecified abdominal pain: Secondary | ICD-10-CM | POA: Diagnosis present

## 2015-06-02 HISTORY — DX: Pure hypercholesterolemia, unspecified: E78.00

## 2015-06-02 MED ORDER — IBUPROFEN 200 MG PO TABS: 600.0000 mg | ORAL_TABLET | Freq: Once | ORAL | Status: DC

## 2015-06-02 NOTE — ED Notes (Signed)
MD at bedside. 

## 2015-06-02 NOTE — Discharge Instructions (Signed)
Pelvic Pain  Pelvic pain is pain below the belly button and located between your hips in the groin. Acute pain may last a few hours or days. Chronic pelvic pain may last weeks and months. The cause may be different for different types of pain. The pain may be dull or sharp, mild or severe and can interfere with your daily activities.  CAUSES   Some common causes of male pelvic pain include:  · A sexually transmitted disease (STD).  · Inflammation of the prostate (prostatitis).  · A bladder infection.  · Intestinal problems, such as constipation, irritable bowel syndrome, colitis, or ileitis.  · Kidney stones.  · Stress or depression.  · Muscle spasms or pain in the pelvic floor muscles.  · Musculoskeletal problems of the back.  · A hernia.  · Inflammation of the epididymis, which sits on top of the testicle (epididymitis).  · Cancer.  EVALUATION   Your caregiver will want to take a careful history of your concerns. This includes recent changes in your health and a sexual history. Obtaining your family history and medical history is also important. In order to identify the cause of the pelvic pain and to be properly treated, your caregiver will perform a physical exam and may order tests. These tests may include:   · Ultrasound of the prostate or scrotum.  · CT scan or MRI of the pelvis or lower spine.  · A urinalysis or evaluation of discharge from the penis.  · Blood tests.  HOME CARE INSTRUCTIONS   · Only take over-the-counter or prescription medicines as directed by your caregiver.  · Rest as directed by your caregiver.  · Eat a balanced diet.  · Drink enough fluids to make your urine clear or pale yellow, or as directed.  · Avoid sexual intercourse if it causes pain.  · Apply warm or cold compresses to the lower abdomen depending on which one helps the pain.  · Avoid stressful situations.  · Keep a journal of your pelvic pain. Write down when it started, where the pain is located, and if there are things that  seem to be associated with the pain, such as food or certain activities.  · Follow up with your caregiver as directed.  SEEK MEDICAL CARE IF:  Your medicine does not help your pain.  SEEK IMMEDIATE MEDICAL CARE IF:  · Your pelvic pain increases.  · You feel lightheaded or faint.  · You have chills.  · You have a fever or persistent symptoms for more than 3 days.  · You have a fever and your symptoms suddenly get worse.  · You have pain with urination or blood in your urine.  · You have uncontrolled diarrhea or vomiting.  MAKE SURE YOU:   · Understand these instructions.  · Will watch your condition.  · Will get help if you are not doing well or get worse.  Document Released: 08/23/2012 Document Revised: 04/15/2014 Document Reviewed: 08/23/2012  [employer]® Patient Information ©2015 [employer]. This information is not intended to replace advice given to you by your health care provider. Make sure you discuss any questions you have with your health care provider.

## 2015-06-02 NOTE — ED Notes (Signed)
Pt has a history of a hernia, however they are unable to operate on it. His PCP has arranged for him to see a pain clinic for this, however he has not been to his first appointment yet. Yesterday he was walking out of bleachers at a race and he mistepped and since then he says he is feeling a protrusion and pain where the hernia is.

## 2015-06-02 NOTE — ED Provider Notes (Signed)
CSN: [id number]     Arrival date & time 06/02/15  0457 History   First MD Initiated Contact with Patient 06/02/15 [phone]     Chief Complaint  Patient presents with  . Abdominal Pain     (Consider location/radiation/quality/duration/timing/severity/associated sxs/prior Treatment) Patient is a 54 y.o. male presenting with abdominal pain.  Abdominal Pain Pain location: right groin. Pain quality: shooting   Pain radiates to:  Does not radiate Pain severity:  Moderate (7/10) Onset quality:  Sudden Duration:  8 hours Timing:  Constant Progression:  Worsening Chronicity:  Recurrent Context comment:  History of remote hernia repair wth frequent exacerbations of pain. Relieved by: rest. Worsened by:  Palpation and movement Associated symptoms: no dysuria, no fever, no hematuria, no nausea and no vomiting     Past Medical History  Diagnosis Date  . Hypertension   . Humerus fracture     right  . Diabetes mellitus   . Inguinal hernia   . High cholesterol    Past Surgical History  Procedure Laterality Date  . Hernia repair    . Cystoscopy  2015    Biopsy   Family History  Problem Relation Age of Onset  . Diabetes Mother   . Hypertension Mother   . Diabetes Father   . Hypertension Father   . Cancer Brother     brain   History  Substance Use Topics  . Smoking status: Current Every Day Smoker -- 1.00 packs/day    Types: Cigarettes  . Smokeless tobacco: Never Used  . Alcohol Use: 0.0 oz/week    0 Standard drinks or equivalent per week     Comment: occasional    Review of Systems  Constitutional: Negative for fever.  Gastrointestinal: Positive for abdominal pain. Negative for nausea and vomiting.  Genitourinary: Negative for dysuria and hematuria.  All other systems reviewed and are negative.     Allergies  Morphine and related; Oxycontin; and Penicillins  Home Medications   Prior to Admission medications   Medication Sig Start Date End Date Taking? Authorizing  Provider  cholecalciferol (VITAMIN D) 1000 UNITS tablet Take 1,000 Units by mouth every other day.    Yes Historical Provider, MD  Cyanocobalamin (VITAMIN B 12 PO) Take 1 tablet by mouth daily.   Yes Historical Provider, MD  Fish Oil-Cholecalciferol (FISH OIL + D3) 1000-1000 MG-UNIT CAPS Take 1 capsule by mouth daily with breakfast.    Yes Historical Provider, MD  gabapentin (NEURONTIN) 300 MG capsule Take 1 capsule (300 mg total) by mouth 3 (three) times daily. 05/27/15  Yes Lorre Munroe, NP  ibuprofen (ADVIL,MOTRIN) 200 MG tablet Take 800 mg by mouth every 6 (six) hours as needed for mild pain or moderate pain.   Yes Historical Provider, MD  lisinopril-hydrochlorothiazide (PRINZIDE,ZESTORETIC) 20-25 MG per tablet Take 1 tablet by mouth daily.    Yes Historical Provider, MD  magnesium oxide (MAG-OX) 400 MG tablet Take 800 mg by mouth at bedtime.    Yes Historical Provider, MD  metFORMIN (GLUCOPHAGE) 1000 MG tablet Take 1 tablet (1,000 mg total) by mouth 2 (two) times daily with a meal. 05/28/15  Yes Lorre Munroe, NP  naproxen sodium (ANAPROX) 220 MG tablet Take 440 mg by mouth 2 (two) times daily as needed (pain).   Yes Historical Provider, MD  simvastatin (ZOCOR) 20 MG tablet Take 1 tablet (20 mg total) by mouth at bedtime. 05/28/15  Yes Lorre Munroe, NP  vitamin C (ASCORBIC ACID) 500 MG tablet Take 500  mg by mouth daily.   Yes Historical Provider, MD  naproxen (NAPROSYN) 375 MG tablet Take 1 tablet (375 mg total) by mouth 2 (two) times daily as needed. Patient not taking: Reported on 05/27/2015 10/30/14   Blane Ohara, MD   BP 160/95 mmHg  Pulse 94  Temp(Src) 98.1 F (36.7 C) (Oral)  Resp 18  Ht  (1.702 m)  Wt 214 lb (97.07 kg)  BMI 33.51 kg/m2  SpO2 97% Physical Exam  Constitutional: He is oriented to person, place, and time. He appears well-developed and well-nourished. No distress.  HENT:  Head: Normocephalic and atraumatic.  Eyes: Conjunctivae are normal. No scleral  icterus.  Neck: Neck supple.  Cardiovascular: Normal rate and intact distal pulses.   Pulmonary/Chest: Effort normal. No stridor. No respiratory distress.  Abdominal: Normal appearance. He exhibits no distension. Hernia confirmed negative in the right inguinal area and confirmed negative in the left inguinal area.  Genitourinary: Penis normal. Right testis shows no swelling and no tenderness. Left testis shows no swelling and no tenderness. Uncircumcised.  Lymphadenopathy:       Right: No inguinal adenopathy present.       Left: No inguinal adenopathy present.  Neurological: He is alert and oriented to person, place, and time.  Skin: Skin is warm and dry. No rash noted.  Psychiatric: He has a normal mood and affect. His behavior is normal.  Nursing note and vitals reviewed.   ED Course  Procedures (including critical care time) Labs Review Labs Reviewed - No data to display  Imaging Review No results found.   EKG Interpretation None      MDM   Final diagnoses:  Groin pain, chronic, right    Right groin pain, which is chronic, exacerbated last night when he stumbled.  Review of his surgery notes from [facility] (care everywhere accessed with verbal patient permission) show that he has chronic pain associated with this area.  He has no palpable hernia on exam today.  I suspect this is a flare up of his chronic groin pain.  Plan dc with NSAIDs, rest.      Blake Divine, MD 06/02/15 [phone]

## 2015-07-09 ENCOUNTER — Emergency Department (HOSPITAL_COMMUNITY): Payer: BLUE CROSS/BLUE SHIELD

## 2015-07-09 ENCOUNTER — Encounter (HOSPITAL_COMMUNITY): Payer: Self-pay

## 2015-07-09 ENCOUNTER — Emergency Department (HOSPITAL_COMMUNITY)
Admission: EM | Admit: 2015-07-09 | Discharge: 2015-07-09 | Disposition: A | Discharge status: Home / Self Care | Payer: BLUE CROSS/BLUE SHIELD | Attending: Emergency Medicine | Admitting: Emergency Medicine

## 2015-07-09 DIAGNOSIS — R0789 Other chest pain: Secondary | ICD-10-CM | POA: Diagnosis not present

## 2015-07-09 DIAGNOSIS — Z880 Allergy status to penicillin: Secondary | ICD-10-CM | POA: Diagnosis not present

## 2015-07-09 DIAGNOSIS — R079 Chest pain, unspecified: Secondary | ICD-10-CM

## 2015-07-09 DIAGNOSIS — E119 Type 2 diabetes mellitus without complications: Secondary | ICD-10-CM | POA: Insufficient documentation

## 2015-07-09 DIAGNOSIS — I10 Essential (primary) hypertension: Secondary | ICD-10-CM | POA: Insufficient documentation

## 2015-07-09 DIAGNOSIS — Z8719 Personal history of other diseases of the digestive system: Secondary | ICD-10-CM | POA: Insufficient documentation

## 2015-07-09 DIAGNOSIS — E780 Pure hypercholesterolemia: Secondary | ICD-10-CM | POA: Diagnosis not present

## 2015-07-09 DIAGNOSIS — Z720 Tobacco use: Secondary | ICD-10-CM | POA: Insufficient documentation

## 2015-07-09 DIAGNOSIS — Z79899 Other long term (current) drug therapy: Secondary | ICD-10-CM | POA: Insufficient documentation

## 2015-07-09 DIAGNOSIS — Z8781 Personal history of (healed) traumatic fracture: Secondary | ICD-10-CM | POA: Insufficient documentation

## 2015-07-09 LAB — BASIC METABOLIC PANEL
ANION GAP: 10 (ref 5–15)
BUN: 17 mg/dL (ref 6–20)
CO2: 28 mmol/L (ref 22–32)
Calcium: 11.3 mg/dL — ABNORMAL HIGH (ref 8.9–10.3)
Chloride: 97 mmol/L — ABNORMAL LOW (ref 101–111)
Creatinine, Ser: 0.89 mg/dL (ref 0.61–1.24)
GFR calc Af Amer: >60 mL/min (ref >60)
GFR calc non Af Amer: >60 mL/min (ref >60)
GLUCOSE: 346 mg/dL — AB (ref 65–99)
Potassium: 3.9 mmol/L (ref 3.5–5.1)
Sodium: 135 mmol/L (ref 135–145)

## 2015-07-09 LAB — CBC
HCT: 46.5 % (ref 39.0–52.0)
Hemoglobin: 16.5 g/dL (ref 13.0–17.0)
MCH: 31 pg (ref 26.0–34.0)
MCHC: 35.5 g/dL (ref 30.0–36.0)
MCV: 87.2 fL (ref 78.0–100.0)
PLATELETS: 241 10*3/uL (ref 150–400)
RBC: 5.33 MIL/uL (ref 4.22–5.81)
RDW: 12.8 % (ref 11.5–15.5)
WBC: 11.2 10*3/uL — AB (ref 4.0–10.5)

## 2015-07-09 LAB — TROPONIN I
Troponin I: <0.03 ng/mL (ref <0.031)
Troponin I: <0.03 ng/mL (ref <0.031)

## 2015-07-09 MED ORDER — OMEPRAZOLE 20 MG PO CPDR: 20.0000 mg | DELAYED_RELEASE_CAPSULE | Freq: Every day | ORAL | Status: DC

## 2015-07-09 MED ORDER — PROMETHAZINE HCL 25 MG PO TABS
25.0000 mg | ORAL_TABLET | ORAL | Status: AC
  Administered 2015-07-09: 25 mg via ORAL
  Filled 2015-07-09: qty 1

## 2015-07-09 NOTE — Consult Note (Signed)
Consulted for medical admission due to burning type chest discomfort which awoke patient up from sleep early this AM.  Chart reviewed including negative troponin as well as EKG with no ST elevations or depressions.    Vital signs: Temp: 98.6 HR: 91 RR: 18 BP: 168/100 SO2 : 95% on room air  Chest pain: Recommended ED personnel discuss with cardiology on board. Sounds atypical and work up so far negative. Agree with cardiology recommendations that pt may have close f/u with pcp for further evaluation and recommendations.  HTN - Consider starting either life style modification (increase exercise or low sodium diet) or adding antihypertensive agent like amlodipine  Justin Landry, Pamala Hurry

## 2015-07-09 NOTE — Discharge Instructions (Signed)
You were evaluated in the ED for your chest discomfort. There does not appear to be an emergent cause to her symptoms at this time. Your exam, labs, EKG and chest x-ray are all reassuring. It is important for you to stop smoking, avoid energy drinks and greasy foods as this can contribute to your discomfort. Follow-up with your primary care for further evaluation. Return to ED for worsening symptoms.  Chest Pain (Nonspecific) It is often hard to give a specific diagnosis for the cause of chest pain. There is always a chance that your pain could be related to something serious, such as a heart attack or a blood clot in the lungs. You need to follow up with your health care provider for further evaluation. CAUSES   Heartburn.  Pneumonia or bronchitis.  Anxiety or stress.  Inflammation around your heart (pericarditis) or lung (pleuritis or pleurisy).  A blood clot in the lung.  A collapsed lung (pneumothorax). It can develop suddenly on its own (spontaneous pneumothorax) or from trauma to the chest.  Shingles infection (herpes zoster virus). The chest wall is composed of bones, muscles, and cartilage. Any of these can be the source of the pain.  The bones can be bruised by injury.  The muscles or cartilage can be strained by coughing or overwork.  The cartilage can be affected by inflammation and become sore (costochondritis). DIAGNOSIS  Lab tests or other studies may be needed to find the cause of your pain. Your health care provider may have you take a test called an ambulatory electrocardiogram (ECG). An ECG records your heartbeat patterns over a 24-hour period. You may also have other tests, such as:  Transthoracic echocardiogram (TTE). During echocardiography, sound waves are used to evaluate how blood flows through your heart.  Transesophageal echocardiogram (TEE).  Cardiac monitoring. This allows your health care provider to monitor your heart rate and rhythm in real  time.  Holter monitor. This is a portable device that records your heartbeat and can help diagnose heart arrhythmias. It allows your health care provider to track your heart activity for several days, if needed.  Stress tests by exercise or by giving medicine that makes the heart beat faster. TREATMENT   Treatment depends on what may be causing your chest pain. Treatment may include:  Acid blockers for heartburn.  Anti-inflammatory medicine.  Pain medicine for inflammatory conditions.  Antibiotics if an infection is present.  You may be advised to change lifestyle habits. This includes stopping smoking and avoiding alcohol, caffeine, and chocolate.  You may be advised to keep your head raised (elevated) when sleeping. This reduces the chance of acid going backward from your stomach into your esophagus. Most of the time, nonspecific chest pain will improve within 2-3 days with rest and mild pain medicine.  HOME CARE INSTRUCTIONS   If antibiotics were prescribed, take them as directed. Finish them even if you start to feel better.  For the next few days, avoid physical activities that bring on chest pain. Continue physical activities as directed.  Do not use any tobacco products, including cigarettes, chewing tobacco, or electronic cigarettes.  Avoid drinking alcohol.  Only take medicine as directed by your health care provider.  Follow your health care provider's suggestions for further testing if your chest pain does not go away.  Keep any follow-up appointments you made. If you do not go to an appointment, you could develop lasting (chronic) problems with pain. If there is any problem keeping an appointment, call to  reschedule. SEEK MEDICAL CARE IF:   Your chest pain does not go away, even after treatment.  You have a rash with blisters on your chest.  You have a fever. SEEK IMMEDIATE MEDICAL CARE IF:   You have increased chest pain or pain that spreads to your arm,  neck, jaw, back, or abdomen.  You have shortness of breath.  You have an increasing cough, or you cough up blood.  You have severe back or abdominal pain.  You feel nauseous or vomit.  You have severe weakness.  You faint.  You have chills. This is an emergency. Do not wait to see if the pain will go away. Get medical help at once. Call your local emergency services (911 in [place] Do not drive yourself to the hospital. MAKE SURE YOU:   Understand these instructions.  Will watch your condition.  Will get help right away if you are not doing well or get worse. Document Released: 09/08/2005 Document Revised: 12/04/2013 Document Reviewed: 07/04/2008 [facility] Patient Information 2015 [place], [place]. This information is not intended to replace advice given to you by your health care provider. Make sure you discuss any questions you have with your health care provider.

## 2015-07-09 NOTE — ED Provider Notes (Signed)
CSN: [id number]     Arrival date & time 07/09/15  0605 History   First MD Initiated Contact with Patient 07/09/15 [phone]     Chief Complaint  Patient presents with  . Chest Pain     (Consider location/radiation/quality/duration/timing/severity/associated sxs/prior Treatment) HPI Justin Landry is a 54 y.o. male with a history of hypertension, hyperlipidemia, diabetes, comes in for evaluation of chest discomfort. Patient states he woke up this morning and began to experience "a stinging pain in the middle of my chest". He is unsure if he ate his peanut butter sandwich and had his cigarette before this discomfort or after. He does report taking some Tums, "burping a few times and it went away for a minute". He reports this sensation will last for only a few seconds and resolved on its own. He has been experiencing discomfort intermittently throughout the morning, most recently at the gas station at 4:45 AM, when he tried to drink a monster energy drink, eat some doughnuts and smoke another cigarette. He also reports he has not taken his blood pressure medicine today "because I had to wait to get to my job because if I take the pill earlier I have to stop 10 times to pee". This discomfort is not exertional and does not radiate. He denies fevers, chills, vision changes, diaphoresis, vomiting, shortness of breath, leg swelling, recent travel or surgeries. He denies any discomfort now in ED. 1PPD smoker  Past Medical History  Diagnosis Date  . Hypertension   . Humerus fracture     right  . Diabetes mellitus   . Inguinal hernia   . High cholesterol    Past Surgical History  Procedure Laterality Date  . Hernia repair    . Cystoscopy  2015    Biopsy   Family History  Problem Relation Age of Onset  . Diabetes Mother   . Hypertension Mother   . Diabetes Father   . Hypertension Father   . Cancer Brother     brain   History  Substance Use Topics  . Smoking status: Current Every Day  Smoker -- 1.00 packs/day    Types: Cigarettes  . Smokeless tobacco: Never Used  . Alcohol Use: 0.0 oz/week    0 Standard drinks or equivalent per week     Comment: occasional    Review of Systems A 10 point review of systems was completed and was negative except for pertinent positives and negatives as mentioned in the history of present illness     Allergies  Morphine and related; Oxycontin; and Penicillins  Home Medications   Prior to Admission medications   Medication Sig Start Date End Date Taking? Authorizing Provider  cholecalciferol (VITAMIN D) 1000 UNITS tablet Take 1,000 Units by mouth every other day.    Yes Historical Provider, MD  Cyanocobalamin (VITAMIN B 12 PO) Take 1 tablet by mouth daily.   Yes Historical Provider, MD  Fish Oil-Cholecalciferol (FISH OIL + D3) 1000-1000 MG-UNIT CAPS Take 1 capsule by mouth daily with breakfast.    Yes Historical Provider, MD  gabapentin (NEURONTIN) 300 MG capsule Take 1 capsule (300 mg total) by mouth 3 (three) times daily. 05/27/15  Yes Lorre Munroe, NP  ibuprofen (ADVIL,MOTRIN) 200 MG tablet Take 800 mg by mouth every 6 (six) hours as needed for mild pain or moderate pain.   Yes Historical Provider, MD  L-Arginine 500 MG CAPS Take 1 capsule by mouth daily.   Yes Historical Provider, MD  lisinopril-hydrochlorothiazide (PRINZIDE,ZESTORETIC) 20-25  MG per tablet Take 1 tablet by mouth daily.    Yes Historical Provider, MD  magnesium oxide (MAG-OX) 400 MG tablet Take 800 mg by mouth at bedtime.    Yes Historical Provider, MD  metFORMIN (GLUCOPHAGE) 1000 MG tablet Take 1 tablet (1,000 mg total) by mouth 2 (two) times daily with a meal. 05/28/15  Yes Lorre Munroe, NP  naproxen sodium (ANAPROX) 220 MG tablet Take 440 mg by mouth 2 (two) times daily as needed (pain).   Yes Historical Provider, MD  oxyCODONE (OXY IR/ROXICODONE) 5 MG immediate release tablet Take 2.5-5 mg by mouth 3 (three) times daily. 06/19/15  Yes Historical Provider, MD   pregabalin (LYRICA) 50 MG capsule Take 50 mg by mouth 2 (two) times daily.   Yes Historical Provider, MD  simvastatin (ZOCOR) 20 MG tablet Take 1 tablet (20 mg total) by mouth at bedtime. 05/28/15  Yes Lorre Munroe, NP  Taurine 500 MG CAPS Take 1 capsule by mouth daily.   Yes Historical Provider, MD  vitamin C (ASCORBIC ACID) 500 MG tablet Take 500 mg by mouth daily.   Yes Historical Provider, MD  naproxen (NAPROSYN) 375 MG tablet Take 1 tablet (375 mg total) by mouth 2 (two) times daily as needed. Patient not taking: Reported on 05/27/2015 10/30/14   Blane Ohara, MD  omeprazole (PRILOSEC) 20 MG capsule Take 1 capsule (20 mg total) by mouth daily. 07/09/15   Joycie Peek, PA-C   BP 143/97 mmHg  Pulse 78  Temp(Src) 98.6 F (37 C) (Oral)  Resp 20  Ht  (1.702 m)  Wt 216 lb (97.977 kg)  BMI 33.82 kg/m2  SpO2 95% Physical Exam  Constitutional: He is oriented to person, place, and time. He appears well-developed and well-nourished.  HENT:  Head: Normocephalic and atraumatic.  Mouth/Throat: Oropharynx is clear and moist.  Eyes: Conjunctivae are normal. Pupils are equal, round, and reactive to light. Right eye exhibits no discharge. Left eye exhibits no discharge. No scleral icterus.  Neck: Neck supple.  Cardiovascular: Normal rate, regular rhythm and normal heart sounds.   Pulmonary/Chest: Effort normal and breath sounds normal. No respiratory distress. He has no wheezes. He has no rales.  Abdominal: Soft. There is no tenderness.  Musculoskeletal: He exhibits no edema or tenderness.  Neurological: He is alert and oriented to person, place, and time.  Cranial Nerves II-XII grossly intact  Skin: Skin is warm and dry. No rash noted.  Psychiatric: He has a normal mood and affect.  Nursing note and vitals reviewed.   ED Course  Procedures (including critical care time) Labs Review Labs Reviewed  BASIC METABOLIC PANEL - Abnormal; Notable for the following:    Chloride 97 (*)     Glucose, Bld 346 (*)    Calcium 11.3 (*)    All other components within normal limits  CBC - Abnormal; Notable for the following:    WBC 11.2 (*)    All other components within normal limits  TROPONIN I  TROPONIN I    Imaging Review Dg Chest 2 View  07/09/2015   CLINICAL DATA:  New onset chest pain this morning.  EXAM: CHEST  2 VIEW  COMPARISON:  10/30/2014  FINDINGS: The cardiomediastinal contours are normal. Mildly coarsened central interstitial markings. Pulmonary vasculature is normal. No consolidation, pleural effusion, or pneumothorax. Degenerative change in the spine. No acute osseous abnormalities are seen.  IMPRESSION: No acute pulmonary process.   Electronically Signed   By: Lujean Rave.D.  On: 07/09/2015 06:43     EKG Interpretation   Date/Time:  Wednesday July 09 2015 06:12:08 EDT Ventricular Rate:  87 PR Interval:  153 QRS Duration: 89 QT Interval:  348 QTC Calculation: 419 R Axis:   153 Text Interpretation:  Sinus rhythm Left posterior fascicular block  Abnormal R-wave progression, late transition Inferior infarct, old No  significant change since last tracing Confirmed by OTTER  MD, OLGA ([id number])  on 07/09/2015 6:14:46 AM     Meds given in ED:  Medications  promethazine (PHENERGAN) tablet 25 mg (25 mg Oral Given 07/09/15 0731)    Discharge Medication List as of 07/09/2015 11:09 AM    START taking these medications   Details  omeprazole (PRILOSEC) 20 MG capsule Take 1 capsule (20 mg total) by mouth daily., Starting 07/09/2015, Until Discontinued, Print       Filed Vitals:   07/09/15 0630 07/09/15 0907 07/09/15 1000 07/09/15 1118  BP: 168/100 144/98 143/97 155/100  Pulse: 91 88 78 83  Temp:      TempSrc:      Resp: 18 19 20 26   Height:      Weight:      SpO2: 95% 96% 95% 98%    MDM  Patient with multiple cardiac risk factors presents for evaluation of chest discomfort. Reports associated nausea. Heart score 4. EKG with possible T-wave  flattening inferiorly. Patient is hypertensive and hyperglycemic in the ED.  Discussed patient presentation and ED course my attending, Dr. Norlene Campbell. Plan is to consult cardiology. Discussed with cardiology atypical chest pain. EKG is not concerning. Agrees is reasonable to have patient evaluated for GI related discomfort and agrees with plan for delta troponin, initiate PPI and follow up with PCP for subsequent referral for cardiac evaluation if necessary. Patient remains symptom-free in the ED. Delta troponins negative. EKG is not concerning per cardiology. Chest x-ray not concerning. We'll have patient follow-up with PCP for further evaluation of GI etiology and referral for cardiac evaluation necessary. Patient encouraged to discontinue cigarette smoking, avoid energy drinks as well as greasy foods. Nursing notes reviewed, labs and imaging review by myself. Patient is stable for discharge to follow-up with PCP.  Final diagnoses:  Chest discomfort       Joycie Peek, PA-C 07/10/15 5621  Marisa Severin, MD 07/15/15 [phone]

## 2015-07-09 NOTE — ED Notes (Signed)
Questions r/t dc were denied, pt ambulatory and a&ox4 

## 2015-07-09 NOTE — ED Notes (Signed)
Patient reports he awoke this am with chest pain since 0345.  He describes pain as a burning/stinging sensation in central chest.  He took Tums without relief this morning.  Several cardiac risk factors.

## 2015-08-08 ENCOUNTER — Other Ambulatory Visit: Payer: Self-pay | Admitting: Internal Medicine

## 2015-08-08 NOTE — Telephone Encounter (Signed)
Pt recently established care 05/2015. pls advise

## 2015-08-26 ENCOUNTER — Telehealth: Payer: Self-pay | Admitting: Internal Medicine

## 2015-08-26 NOTE — Telephone Encounter (Signed)
Yes, send him a meter, strips and lancets. Have him test fasting blood sugars daily.

## 2015-08-26 NOTE — Telephone Encounter (Signed)
Stanton Kidney calling for patient has diabetes and takes metformin each day.  He does not have a glucose meter to check his blood sugars.  Stanton Kidney is asking if the patient needs to be checking his blood sugars on a daily basis.

## 2015-08-28 ENCOUNTER — Other Ambulatory Visit: Payer: Self-pay

## 2015-08-28 DIAGNOSIS — E119 Type 2 diabetes mellitus without complications: Secondary | ICD-10-CM

## 2015-08-28 MED ORDER — ONETOUCH ULTRA SYSTEM W/DEVICE KIT: 1.0000 | PACK | Freq: Once | Status: DC

## 2015-08-28 MED ORDER — ONETOUCH ULTRASOFT LANCETS MISC: 1.0000 | Freq: Two times a day (BID) | Status: DC

## 2015-08-28 MED ORDER — ONETOUCH LANCETS MISC: 1.0000 | Freq: Two times a day (BID) | Status: DC

## 2015-08-28 MED ORDER — GLUCOSE BLOOD VI STRP: ORAL_STRIP | Status: DC

## 2015-08-28 NOTE — Telephone Encounter (Signed)
Rx sent through [facility]  

## 2015-08-28 NOTE — Addendum Note (Signed)
Addended by: Roena Malady on: 08/28/2015 09:25 AM   Modules accepted: Orders

## 2015-10-13 ENCOUNTER — Emergency Department (HOSPITAL_COMMUNITY)
Admission: EM | Admit: 2015-10-13 | Discharge: 2015-10-13 | Disposition: A | Discharge status: Home / Self Care | Payer: BLUE CROSS/BLUE SHIELD | Attending: Emergency Medicine | Admitting: Emergency Medicine

## 2015-10-13 ENCOUNTER — Emergency Department (HOSPITAL_COMMUNITY): Payer: BLUE CROSS/BLUE SHIELD

## 2015-10-13 DIAGNOSIS — I10 Essential (primary) hypertension: Secondary | ICD-10-CM | POA: Diagnosis not present

## 2015-10-13 DIAGNOSIS — Z8781 Personal history of (healed) traumatic fracture: Secondary | ICD-10-CM | POA: Diagnosis not present

## 2015-10-13 DIAGNOSIS — R197 Diarrhea, unspecified: Secondary | ICD-10-CM

## 2015-10-13 DIAGNOSIS — Z79899 Other long term (current) drug therapy: Secondary | ICD-10-CM | POA: Diagnosis not present

## 2015-10-13 DIAGNOSIS — E7800 Pure hypercholesterolemia, unspecified: Secondary | ICD-10-CM | POA: Insufficient documentation

## 2015-10-13 DIAGNOSIS — Z8719 Personal history of other diseases of the digestive system: Secondary | ICD-10-CM | POA: Insufficient documentation

## 2015-10-13 DIAGNOSIS — Z880 Allergy status to penicillin: Secondary | ICD-10-CM | POA: Insufficient documentation

## 2015-10-13 DIAGNOSIS — J069 Acute upper respiratory infection, unspecified: Secondary | ICD-10-CM | POA: Diagnosis not present

## 2015-10-13 DIAGNOSIS — Z720 Tobacco use: Secondary | ICD-10-CM | POA: Insufficient documentation

## 2015-10-13 DIAGNOSIS — R112 Nausea with vomiting, unspecified: Secondary | ICD-10-CM | POA: Insufficient documentation

## 2015-10-13 DIAGNOSIS — R05 Cough: Secondary | ICD-10-CM | POA: Diagnosis present

## 2015-10-13 DIAGNOSIS — E119 Type 2 diabetes mellitus without complications: Secondary | ICD-10-CM | POA: Insufficient documentation

## 2015-10-13 LAB — CBC
HEMATOCRIT: 47.5 % (ref 39.0–52.0)
Hemoglobin: 16.9 g/dL (ref 13.0–17.0)
MCH: 31.7 pg (ref 26.0–34.0)
MCHC: 35.6 g/dL (ref 30.0–36.0)
MCV: 89.1 fL (ref 78.0–100.0)
PLATELETS: 307 10*3/uL (ref 150–400)
RBC: 5.33 MIL/uL (ref 4.22–5.81)
RDW: 12.9 % (ref 11.5–15.5)
WBC: 11 10*3/uL — ABNORMAL HIGH (ref 4.0–10.5)

## 2015-10-13 LAB — COMPREHENSIVE METABOLIC PANEL
ALBUMIN: 4.4 g/dL (ref 3.5–5.0)
ALT: 36 U/L (ref 17–63)
AST: 25 U/L (ref 15–41)
Alkaline Phosphatase: 55 U/L (ref 38–126)
Anion gap: 8 (ref 5–15)
BUN: 14 mg/dL (ref 6–20)
CHLORIDE: 101 mmol/L (ref 101–111)
CO2: 26 mmol/L (ref 22–32)
Calcium: 10.1 mg/dL (ref 8.9–10.3)
Creatinine, Ser: 0.76 mg/dL (ref 0.61–1.24)
GFR calc Af Amer: >60 mL/min (ref >60)
GFR calc non Af Amer: >60 mL/min (ref >60)
GLUCOSE: 181 mg/dL — AB (ref 65–99)
POTASSIUM: 4.6 mmol/L (ref 3.5–5.1)
SODIUM: 135 mmol/L (ref 135–145)
TOTAL PROTEIN: 7.8 g/dL (ref 6.5–8.1)
Total Bilirubin: 0.8 mg/dL (ref 0.3–1.2)

## 2015-10-13 MED ORDER — BENZONATATE 100 MG PO CAPS: 100.0000 mg | ORAL_CAPSULE | Freq: Three times a day (TID) | ORAL | Status: DC | PRN

## 2015-10-13 MED ORDER — ALBUTEROL SULFATE HFA 108 (90 BASE) MCG/ACT IN AERS
2.0000 | INHALATION_SPRAY | Freq: Once | RESPIRATORY_TRACT | Status: AC
  Administered 2015-10-13: 2 via RESPIRATORY_TRACT
  Filled 2015-10-13: qty 6.7

## 2015-10-13 MED ORDER — ACETAMINOPHEN 500 MG PO TABS: 500.0000 mg | ORAL_TABLET | Freq: Four times a day (QID) | ORAL | Status: DC | PRN

## 2015-10-13 MED ORDER — CETIRIZINE HCL 10 MG PO TABS: 10.0000 mg | ORAL_TABLET | Freq: Every day | ORAL | Status: DC

## 2015-10-13 NOTE — Discharge Instructions (Signed)
Upper Respiratory Infection, Adult °Most upper respiratory infections (URIs) are a viral infection of the air passages leading to the lungs. A URI affects the nose, throat, and upper air passages. The most common type of URI is nasopharyngitis and is typically referred to as "the common cold." °URIs run their course and usually go away on their own. Most of the time, a URI does not require medical attention, but sometimes a bacterial infection in the upper airways can follow a viral infection. This is called a secondary infection. Sinus and middle ear infections are common types of secondary upper respiratory infections. °Bacterial pneumonia can also complicate a URI. A URI can worsen asthma and chronic obstructive pulmonary disease (COPD). Sometimes, these complications can require emergency medical care and may be life threatening.  °CAUSES °Almost all URIs are caused by viruses. A virus is a type of germ and can spread from one person to another.  °RISKS FACTORS °You may be at risk for a URI if:  °· You smoke.   °· You have chronic heart or lung disease. °· You have a weakened defense (immune) system.   °· You are very young or very old.   °· You have nasal allergies or asthma. °· You work in crowded or poorly ventilated areas. °· You work in health care facilities or schools. °SIGNS AND SYMPTOMS  °Symptoms typically develop 2-3 days after you come in contact with a cold virus. Most viral URIs last 7-10 days. However, viral URIs from the influenza virus (flu virus) can last 14-18 days and are typically more severe. Symptoms may include:  °· Runny or stuffy (congested) nose.   °· Sneezing.   °· Cough.   °· Sore throat.   °· Headache.   °· Fatigue.   °· Fever.   °· Loss of appetite.   °· Pain in your forehead, behind your eyes, and over your cheekbones (sinus pain). °· Muscle aches.   °DIAGNOSIS  °Your health care provider may diagnose a URI by: °· Physical exam. °· Tests to check that your symptoms are not due to  another condition such as: °· Strep throat. °· Sinusitis. °· Pneumonia. °· Asthma. °TREATMENT  °A URI goes away on its own with time. It cannot be cured with medicines, but medicines may be prescribed or recommended to relieve symptoms. Medicines may help: °· Reduce your fever. °· Reduce your cough. °· Relieve nasal congestion. °HOME CARE INSTRUCTIONS  °· Take medicines only as directed by your health care provider.   °· Gargle warm saltwater or take cough drops to comfort your throat as directed by your health care provider. °· Use a warm mist humidifier or inhale steam from a shower to increase air moisture. This may make it easier to breathe. °· Drink enough fluid to keep your urine clear or pale yellow.   °· Eat soups and other clear broths and maintain good nutrition.   °· Rest as needed.   °· Return to work when your temperature has returned to normal or as your health care provider advises. You may need to stay home longer to avoid infecting others. You can also use a face mask and careful hand washing to prevent spread of the virus. °· Increase the usage of your inhaler if you have asthma.   °· Do not use any tobacco products, including cigarettes, chewing tobacco, or electronic cigarettes. If you need help quitting, ask your health care provider. °PREVENTION  °The best way to protect yourself from getting a cold is to practice good hygiene.  °· Avoid oral or hand contact with people with cold   symptoms.   Wash your hands often if contact occurs.  There is no clear evidence that vitamin C, vitamin E, echinacea, or exercise reduces the chance of developing a cold. However, it is always recommended to get plenty of rest, exercise, and practice good nutrition.  SEEK MEDICAL CARE IF:   You are getting worse rather than better.   Your symptoms are not controlled by medicine.   You have chills.  You have worsening shortness of breath.  You have brown or red mucus.  You have yellow or brown nasal  discharge.  You have pain in your face, especially when you bend forward.  You have a fever.  You have swollen neck glands.  You have pain while swallowing.  You have white areas in the back of your throat. SEEK IMMEDIATE MEDICAL CARE IF:   You have severe or persistent:  Headache.  Ear pain.  Sinus pain.  Chest pain.  You have chronic lung disease and any of the following:  Wheezing.  Prolonged cough.  Coughing up blood.  A change in your usual mucus.  You have a stiff neck.  You have changes in your:  Vision.  Hearing.  Thinking.  Mood. MAKE SURE YOU:   Understand these instructions.  Will watch your condition.  Will get help right away if you are not doing well or get worse.   This information is not intended to replace advice given to you by your health care provider. Make sure you discuss any questions you have with your health care provider.   Document Released: 05/25/2001 Document Revised: 04/15/2015 Document Reviewed: 03/06/2014 Elsevier Interactive Patient Education 2016 [employer] Nausea and Vomiting Nausea is a sick feeling that often comes before throwing up (vomiting). Vomiting is a reflex where stomach contents come out of your mouth. Vomiting can cause severe loss of body fluids (dehydration). Children and elderly adults can become dehydrated quickly, especially if they also have diarrhea. Nausea and vomiting are symptoms of a condition or disease. It is important to find the cause of your symptoms. CAUSES   Direct irritation of the stomach lining. This irritation can result from increased acid production (gastroesophageal reflux disease), infection, food poisoning, taking certain medicines (such as nonsteroidal anti-inflammatory drugs), alcohol use, or tobacco use.  Signals from the brain.These signals could be caused by a headache, heat exposure, an inner ear disturbance, increased pressure in the brain from injury, infection, a  tumor, or a concussion, pain, emotional stimulus, or metabolic problems.  An obstruction in the gastrointestinal tract (bowel obstruction).  Illnesses such as diabetes, hepatitis, gallbladder problems, appendicitis, kidney problems, cancer, sepsis, atypical symptoms of a heart attack, or eating disorders.  Medical treatments such as chemotherapy and radiation.  Receiving medicine that makes you sleep (general anesthetic) during surgery. DIAGNOSIS Your caregiver may ask for tests to be done if the problems do not improve after a few days. Tests may also be done if symptoms are severe or if the reason for the nausea and vomiting is not clear. Tests may include:  Urine tests.  Blood tests.  Stool tests.  Cultures (to look for evidence of infection).  X-rays or other imaging studies. Test results can help your caregiver make decisions about treatment or the need for additional tests. TREATMENT You need to stay well hydrated. Drink frequently but in small amounts.You may wish to drink water, sports drinks, clear broth, or eat frozen ice pops or gelatin dessert to help stay hydrated.When you eat, eating slowly may  help prevent nausea.There are also some antinausea medicines that may help prevent nausea. HOME CARE INSTRUCTIONS   Take all medicine as directed by your caregiver.  If you do not have an appetite, do not force yourself to eat. However, you must continue to drink fluids.  If you have an appetite, eat a normal diet unless your caregiver tells you differently.  Eat a variety of complex carbohydrates (rice, wheat, potatoes, bread), lean meats, yogurt, fruits, and vegetables.  Avoid high-fat foods because they are more difficult to digest.  Drink enough water and fluids to keep your urine clear or pale yellow.  If you are dehydrated, ask your caregiver for specific rehydration instructions. Signs of dehydration may include:  Severe thirst.  Dry lips and  mouth.  Dizziness.  Dark urine.  Decreasing urine frequency and amount.  Confusion.  Rapid breathing or pulse. SEEK IMMEDIATE MEDICAL CARE IF:   You have blood or brown flecks (like coffee grounds) in your vomit.  You have black or bloody stools.  You have a severe headache or stiff neck.  You are confused.  You have severe abdominal pain.  You have chest pain or trouble breathing.  You do not urinate at least once every 8 hours.  You develop cold or clammy skin.  You continue to vomit for longer than 24 to 48 hours.  You have a fever. MAKE SURE YOU:   Understand these instructions.  Will watch your condition.  Will get help right away if you are not doing well or get worse.   This information is not intended to replace advice given to you by your health care provider. Make sure you discuss any questions you have with your health care provider.   Document Released: 11/29/2005 Document Revised: 02/21/2012 Document Reviewed: 04/28/2011 Elsevier Interactive Patient Education 2016 [employer] Inc. Diarrhea Diarrhea is frequent loose and watery bowel movements. It can cause you to feel weak and dehydrated. Dehydration can cause you to become tired and thirsty, have a dry mouth, and have decreased urination that often is dark yellow. Diarrhea is a sign of another problem, most often an infection that will not last long. In most cases, diarrhea typically lasts 2-3 days. However, it can last longer if it is a sign of something more serious. It is important to treat your diarrhea as directed by your caregiver to lessen or prevent future episodes of diarrhea. CAUSES  Some common causes include:  Gastrointestinal infections caused by viruses, bacteria, or parasites.  Food poisoning or food allergies.  Certain medicines, such as antibiotics, chemotherapy, and laxatives.  Artificial sweeteners and fructose.  Digestive disorders. HOME CARE INSTRUCTIONS  Ensure adequate  fluid intake (hydration): Have 1 cup (8 oz) of fluid for each diarrhea episode. Avoid fluids that contain simple sugars or sports drinks, fruit juices, whole milk products, and sodas. Your urine should be clear or pale yellow if you are drinking enough fluids. Hydrate with an oral rehydration solution that you can purchase at pharmacies, retail stores, and online. You can prepare an oral rehydration solution at home by mixing the following ingredients together:   - tsp table salt.   tsp baking soda.   tsp salt substitute containing potassium chloride.  1  tablespoons sugar.  1 L (34 oz) of water.  Certain foods and beverages may increase the speed at which food moves through the gastrointestinal (GI) tract. These foods and beverages should be avoided and include:  Caffeinated and alcoholic beverages.  High-fiber foods, such as  raw fruits and vegetables, nuts, seeds, and whole grain breads and cereals.  Foods and beverages sweetened with sugar alcohols, such as xylitol, sorbitol, and mannitol.  Some foods may be well tolerated and may help thicken stool including:  Starchy foods, such as rice, toast, pasta, low-sugar cereal, oatmeal, grits, baked potatoes, crackers, and bagels.  Bananas.  Applesauce.  Add probiotic-rich foods to help increase healthy bacteria in the GI tract, such as yogurt and fermented milk products.  Wash your hands well after each diarrhea episode.  Only take over-the-counter or prescription medicines as directed by your caregiver.  Take a warm bath to relieve any burning or pain from frequent diarrhea episodes. SEEK IMMEDIATE MEDICAL CARE IF:   You are unable to keep fluids down.  You have persistent vomiting.  You have blood in your stool, or your stools are black and tarry.  You do not urinate in 6-8 hours, or there is only a small amount of very dark urine.  You have abdominal pain that increases or localizes.  You have weakness, dizziness,  confusion, or light-headedness.  You have a severe headache.  Your diarrhea gets worse or does not get better.  You have a fever or persistent symptoms for more than 2-3 days.  You have a fever and your symptoms suddenly get worse. MAKE SURE YOU:   Understand these instructions.  Will watch your condition.  Will get help right away if you are not doing well or get worse.   This information is not intended to replace advice given to you by your health care provider. Make sure you discuss any questions you have with your health care provider.   Document Released: 11/19/2002 Document Revised: 12/20/2014 Document Reviewed: 08/06/2012 Elsevier Interactive Patient Education [employer]

## 2015-10-13 NOTE — ED Notes (Signed)
PA at bedside.

## 2015-10-13 NOTE — ED Provider Notes (Signed)
CSN: [id number]     Arrival date & time 10/13/15  1020 History   First MD Initiated Contact with Patient 10/13/15 1357     Chief Complaint  Patient presents with  . Generalized Body Aches  . Cough  . Emesis   Justin Landry is a 54 y.o. male with history of hypertension, and diabetes who presents to the emergency department complaining of 3 days of cough, nasal congestion, nausea, vomiting, and diarrhea. The patient reports that multiple coworkers are sick with a similar illness. He reports he started with nausea vomiting diarrhea on Saturday. He reports this is slowly been resolving and he only vomited once earlier morning but has had no nausea or vomiting or diarrhea since. He presented no diarrhea today. He denies any abdominal pain with these symptoms. He also reports he's had a productive cough and chills. He denies having chest pain, shortness breath or palpitations. He denies hemoptysis. He does report some subjective wheezing.  Denies history of COPD or emphysema. His only abdominal surgical history includes a hernia repair in 2009. The patient has had nothing for treatment today. The patient denies fevers, hemoptysis, hematochezia, hematemesis, shortness of breath, chest pain, palpitations, rashes, urinary symptoms, syncope, lightheadedness, dizziness, or abdominal pain.  (Consider location/radiation/quality/duration/timing/severity/associated sxs/prior Treatment) HPI  Past Medical History  Diagnosis Date  . Hypertension   . Humerus fracture     right  . Diabetes mellitus   . Inguinal hernia   . High cholesterol    Past Surgical History  Procedure Laterality Date  . Hernia repair    . Cystoscopy  2015    Biopsy   Family History  Problem Relation Age of Onset  . Diabetes Mother   . Hypertension Mother   . Diabetes Father   . Hypertension Father   . Cancer Brother     brain   Social History  Substance Use Topics  . Smoking status: Current Every Day Smoker --  1.00 packs/day    Types: Cigarettes  . Smokeless tobacco: Never Used  . Alcohol Use: 0.0 oz/week    0 Standard drinks or equivalent per week     Comment: occasional    Review of Systems  Constitutional: Positive for chills. Negative for fever.  HENT: Positive for postnasal drip and rhinorrhea. Negative for congestion, ear pain, sore throat and trouble swallowing.   Eyes: Negative for pain and visual disturbance.  Respiratory: Positive for cough and wheezing. Negative for shortness of breath.   Cardiovascular: Negative for chest pain and palpitations.  Gastrointestinal: Negative for nausea, vomiting, abdominal pain and diarrhea.  Genitourinary: Negative for dysuria, urgency, frequency, hematuria, flank pain, decreased urine volume and difficulty urinating.  Musculoskeletal: Negative for back pain and neck pain.  Skin: Negative for rash.  Neurological: Negative for dizziness, syncope, weakness, light-headedness and headaches.      Allergies  Morphine and related; Oxycontin; and Penicillins  Home Medications   Prior to Admission medications   Medication Sig Start Date End Date Taking? Authorizing Provider  cholecalciferol (VITAMIN D) 1000 UNITS tablet Take 1,000 Units by mouth every other day.    Yes Historical Provider, MD  Cyanocobalamin (VITAMIN B 12 PO) Take 1 tablet by mouth daily.   Yes Historical Provider, MD  Fish Oil-Cholecalciferol (FISH OIL + D3) 1000-1000 MG-UNIT CAPS Take 1 capsule by mouth daily with breakfast.    Yes Historical Provider, MD  gabapentin (NEURONTIN) 300 MG capsule TAKE 1 CAPSULE BY MOUTH 3 TIMES A DAY 08/08/15  Yes Rollene Fare  Barnabas Harries, NP  ibuprofen (ADVIL,MOTRIN) 200 MG tablet Take 800 mg by mouth every 6 (six) hours as needed for mild pain or moderate pain.   Yes Historical Provider, MD  L-Arginine 500 MG CAPS Take 2 capsules by mouth daily.    Yes Historical Provider, MD  lisinopril-hydrochlorothiazide (PRINZIDE,ZESTORETIC) 20-25 MG per tablet Take 1 tablet  by mouth daily.    Yes Historical Provider, MD  magnesium oxide (MAG-OX) 400 MG tablet Take 800 mg by mouth at bedtime.    Yes Historical Provider, MD  metFORMIN (GLUCOPHAGE) 1000 MG tablet Take 1 tablet (1,000 mg total) by mouth 2 (two) times daily with a meal. Patient taking differently: Take 1,000 mg by mouth daily.  05/28/15  Yes Jearld Fenton, NP  naproxen sodium (ANAPROX) 220 MG tablet Take 440 mg by mouth 2 (two) times daily as needed (pain).   Yes Historical Provider, MD  omeprazole (PRILOSEC) 20 MG capsule Take 1 capsule (20 mg total) by mouth daily. Patient taking differently: Take 20 mg by mouth daily as needed (acid reflux).  07/09/15  Yes Comer Locket, PA-C  oxyCODONE (OXY IR/ROXICODONE) 5 MG immediate release tablet Take 2.5-5 mg by mouth 3 (three) times daily. 06/19/15  Yes Historical Provider, MD  pregabalin (LYRICA) 50 MG capsule Take 50 mg by mouth 2 (two) times daily.   Yes Historical Provider, MD  simvastatin (ZOCOR) 20 MG tablet Take 1 tablet (20 mg total) by mouth at bedtime. 05/28/15  Yes Jearld Fenton, NP  Taurine 500 MG CAPS Take 1 capsule by mouth daily.   Yes Historical Provider, MD  vitamin C (ASCORBIC ACID) 500 MG tablet Take 500 mg by mouth daily.   Yes Historical Provider, MD  acetaminophen (TYLENOL) 500 MG tablet Take 1 tablet (500 mg total) by mouth every 6 (six) hours as needed. 10/13/15   Waynetta Pean, PA-C  benzonatate (TESSALON) 100 MG capsule Take 1 capsule (100 mg total) by mouth 3 (three) times daily as needed for cough. 10/13/15   Waynetta Pean, PA-C  Blood Glucose Monitoring Suppl (ONE TOUCH ULTRA SYSTEM KIT) W/DEVICE KIT 1 kit by Does not apply route once. 08/28/15   Jearld Fenton, NP  cetirizine (ZYRTEC ALLERGY) 10 MG tablet Take 1 tablet (10 mg total) by mouth daily. 10/13/15   Waynetta Pean, PA-C  glucose blood (ONE TOUCH TEST STRIPS) test strip Use as instructed 08/28/15   Jearld Fenton, NP  Lancets [facility] ULTRASOFT) lancets 1 each by Other  route 2 (two) times daily. Use as instructed 08/28/15   Jearld Fenton, NP  naproxen (NAPROSYN) 375 MG tablet Take 1 tablet (375 mg total) by mouth 2 (two) times daily as needed. Patient not taking: Reported on 05/27/2015 10/30/14   Elnora Morrison, MD   BP 145/97 mmHg  Pulse 71  Temp(Src) 98.1 F (36.7 C) (Oral)  Resp 18  SpO2 95% Physical Exam  Constitutional: He is oriented to person, place, and time. He appears well-developed and well-nourished. No distress.  Nontoxic appearing.  HENT:  Head: Normocephalic and atraumatic.  Right Ear: External ear normal.  Left Ear: External ear normal.  Mouth/Throat: Oropharynx is clear and moist. No oropharyngeal exudate.  Bilateral tympanic membranes are pearly-gray without erythema or loss of landmarks.  Boggy nasal terminates bilaterally. No tonsillar hypertrophy or exudates.  Eyes: Conjunctivae are normal. Pupils are equal, round, and reactive to light. Right eye exhibits no discharge. Left eye exhibits no discharge.  Neck: Normal range of motion. Neck supple. No  JVD present. No tracheal deviation present.  Cardiovascular: Normal rate, regular rhythm, normal heart sounds and intact distal pulses.  Exam reveals no gallop and no friction rub.   No murmur heard. Pulmonary/Chest: Effort normal and breath sounds normal. No respiratory distress. He has no wheezes. He has no rales. He exhibits no tenderness.  Lungs are clear to auscultation bilaterally.  Abdominal: Soft. Bowel sounds are normal. He exhibits no distension. There is no tenderness. There is no rebound and no guarding.  Abdomen is soft and nontender to palpation. No right lower quadrant abdominal tenderness to palpation. No peritoneal signs.  Musculoskeletal: He exhibits no edema or tenderness.  No lower extremity edema tenderness.  Lymphadenopathy:    He has no cervical adenopathy.  Neurological: He is alert and oriented to person, place, and time. Coordination normal.  Skin: Skin is warm  and dry. No rash noted. He is not diaphoretic. No erythema. No pallor.  Psychiatric: He has a normal mood and affect. His behavior is normal.  Nursing note and vitals reviewed.   ED Course  Procedures (including critical care time) Labs Review Labs Reviewed  CBC - Abnormal; Notable for the following:    WBC 11.0 (*)    All other components within normal limits  COMPREHENSIVE METABOLIC PANEL - Abnormal; Notable for the following:    Glucose, Bld 181 (*)    All other components within normal limits    Imaging Review Dg Chest 2 View  10/13/2015  CLINICAL DATA:  Productive cough, congestion EXAM: CHEST  2 VIEW COMPARISON:  07/09/2015 FINDINGS: Cardiomediastinal silhouette is stable. No acute infiltrate or pleural effusion. No pulmonary edema. Degenerative changes mid and lower thoracic spine. IMPRESSION: No active cardiopulmonary disease. Electronically Signed   By: Lahoma Crocker M.D.   On: 10/13/2015 11:25   I have personally reviewed and evaluated these images and lab results as part of my medical decision-making.   EKG Interpretation None      Filed Vitals:   10/13/15 1054 10/13/15 1338 10/13/15 1346 10/13/15 1400  BP:  157/116  145/97  Pulse: 83 77 75 71  Temp: 98.1 F (36.7 C)     TempSrc: Oral     Resp: 16 18    SpO2: 95% 95% 99% 95%     MDM   Meds given in ED:  Medications  albuterol (PROVENTIL HFA;VENTOLIN HFA) 108 (90 BASE) MCG/ACT inhaler 2 puff (not administered)    New Prescriptions   ACETAMINOPHEN (TYLENOL) 500 MG TABLET    Take 1 tablet (500 mg total) by mouth every 6 (six) hours as needed.   BENZONATATE (TESSALON) 100 MG CAPSULE    Take 1 capsule (100 mg total) by mouth 3 (three) times daily as needed for cough.   CETIRIZINE (ZYRTEC ALLERGY) 10 MG TABLET    Take 1 tablet (10 mg total) by mouth daily.    Final diagnoses:  URI (upper respiratory infection)  Nausea vomiting and diarrhea   This  is a 54 y.o. male with history of hypertension, and diabetes  who presents to the emergency department complaining of 3 days of cough, nasal congestion, nausea, vomiting, and diarrhea. The patient reports that multiple coworkers are sick with a similar illness. He reports he started with nausea vomiting diarrhea on Saturday. He reports this is slowly been resolving and he only vomited once earlier morning but has had no nausea or vomiting or diarrhea since. He presented no diarrhea today. He denies any abdominal pain with these symptoms. He also  reports he's had a productive cough and chills. He denies having chest pain, shortness breath or palpitations. He denies hemoptysis. He does report some subjective wheezing.  On exam patient is afebrile and nontoxic appearing. He is not tachypneic, tachycardic or hypoxic. His lungs are clear to auscultation bilaterally. His abdomen is soft to palpation. He does report some subjective wheezing intermittently. We'll provide albuterol inhaler in the ED. Chest x-ray shows no active cardiopulmonary disease. CMP is remarkable only for glucose of 181. Patient is diabetic and I advised him of this glucose. CBC is marked wall for a leukocytosis with a white count of 11,000. Patient has upper respiratory infection with viral syndrome. Nausea, vomiting diarrhea is resolving. He is tolerating by mouth today without vomiting. He denies any nausea. Will discharge with prescriptions for Tylenol, Tessalon Perles and Zyrtec. Advised to have close follow-up with his primary care provider. I advised strict return precautions. I advised the patient to follow-up with their primary care provider this week. I advised the patient to return to the emergency department with new or worsening symptoms or new concerns. The patient verbalized understanding and agreement with plan.       Waynetta Pean, PA-C 10/13/15 [place], MD 10/16/15 [phone]

## 2015-10-13 NOTE — ED Notes (Signed)
Pt reports hx of HTN, did not take medications today. Reports body aches, productive cough, yellow sputum, vomiting/ diarrhea, and sweats since Saturday. Body aches 7/10

## 2015-11-27 ENCOUNTER — Ambulatory Visit: Payer: BLUE CROSS/BLUE SHIELD | Admitting: Internal Medicine

## 2015-12-23 ENCOUNTER — Other Ambulatory Visit: Payer: Self-pay | Admitting: Internal Medicine

## 2015-12-30 ENCOUNTER — Emergency Department (HOSPITAL_COMMUNITY)
Admission: EM | Admit: 2015-12-30 | Discharge: 2015-12-30 | Disposition: A | Discharge status: Home / Self Care | Payer: BLUE CROSS/BLUE SHIELD | Attending: Physician Assistant | Admitting: Physician Assistant

## 2015-12-30 ENCOUNTER — Encounter (HOSPITAL_COMMUNITY): Payer: Self-pay | Admitting: Emergency Medicine

## 2015-12-30 DIAGNOSIS — I10 Essential (primary) hypertension: Secondary | ICD-10-CM | POA: Insufficient documentation

## 2015-12-30 DIAGNOSIS — E119 Type 2 diabetes mellitus without complications: Secondary | ICD-10-CM | POA: Diagnosis not present

## 2015-12-30 DIAGNOSIS — Z8719 Personal history of other diseases of the digestive system: Secondary | ICD-10-CM | POA: Diagnosis not present

## 2015-12-30 DIAGNOSIS — Z8781 Personal history of (healed) traumatic fracture: Secondary | ICD-10-CM | POA: Diagnosis not present

## 2015-12-30 DIAGNOSIS — Z880 Allergy status to penicillin: Secondary | ICD-10-CM | POA: Diagnosis not present

## 2015-12-30 DIAGNOSIS — F17210 Nicotine dependence, cigarettes, uncomplicated: Secondary | ICD-10-CM | POA: Diagnosis not present

## 2015-12-30 DIAGNOSIS — E7800 Pure hypercholesterolemia, unspecified: Secondary | ICD-10-CM | POA: Diagnosis not present

## 2015-12-30 DIAGNOSIS — Z79899 Other long term (current) drug therapy: Secondary | ICD-10-CM | POA: Insufficient documentation

## 2015-12-30 DIAGNOSIS — K0889 Other specified disorders of teeth and supporting structures: Secondary | ICD-10-CM | POA: Diagnosis present

## 2015-12-30 DIAGNOSIS — Z7984 Long term (current) use of oral hypoglycemic drugs: Secondary | ICD-10-CM | POA: Diagnosis not present

## 2015-12-30 MED ORDER — OXYCODONE-ACETAMINOPHEN 5-325 MG PO TABS: 1.0000 | ORAL_TABLET | Freq: Four times a day (QID) | ORAL | Status: DC | PRN

## 2015-12-30 MED ORDER — CLINDAMYCIN HCL 300 MG PO CAPS: 300.0000 mg | ORAL_CAPSULE | Freq: Three times a day (TID) | ORAL | Status: AC

## 2015-12-30 MED ORDER — CLINDAMYCIN HCL 300 MG PO CAPS
300.0000 mg | ORAL_CAPSULE | Freq: Once | ORAL | Status: AC
  Administered 2015-12-30: 300 mg via ORAL
  Filled 2015-12-30: qty 1

## 2015-12-30 NOTE — Discharge Instructions (Signed)
Please return with swelling, fever.   Emergency Department Resource Guide 1) Find a Doctor and Pay Out of Pocket Although you won't have to find out who is covered by your insurance plan, it is a good idea to ask around and get recommendations. You will then need to call the office and see if the doctor you have chosen will accept you as a new patient and what types of options they offer for patients who are self-pay. Some doctors offer discounts or will set up payment plans for their patients who do not have insurance, but you will need to ask so you aren't surprised when you get to your appointment.  2) Contact Your Local Health Department Not all health departments have doctors that can see patients for sick visits, but many do, so it is worth a call to see if yours does. If you don't know where your local health department is, you can check in your phone book. The CDC also has a tool to help you locate your state's health department, and many state websites also have listings of all of their local health departments.  3) Find a Walk-in Clinic If your illness is not likely to be very severe or complicated, you may want to try a walk in clinic. These are popping up all over the country in pharmacies, drugstores, and shopping centers. They're usually staffed by nurse practitioners or physician assistants that have been trained to treat common illnesses and complaints. They're usually fairly quick and inexpensive. However, if you have serious medical issues or chronic medical problems, these are probably not your best option.  No Primary Care Doctor: - Call Health Connect at  [phone] - they can help you locate a primary care doctor that  accepts your insurance, provides certain services, etc. - Physician Referral Service- [phone]  Chronic Pain Problems: Organization         Address  Phone   Notes  [facility]  [phone] Patients need to be referred by their primary  care doctor.   Medication Assistance: Organization         Address  Phone   Notes  [facility] Medication [facility] [address] [place] [place], [place] [address] [phone] --Must be a resident of [facility] -- Must have NO insurance coverage whatsoever (no Medicaid/ Medicare, etc.) -- The pt. MUST have a primary care doctor that directs their care regularly and follows them in the community   [facility]ist  [phone]   [employer]  [phone]    Agencies that provide inexpensive medical care: Organization         Address  Phone   Notes  [facility]  [phone]   [facility]    [phone]   [facility] [address] [place], [place] [address] [phone]   [facility] [address] [place], [place] [phone]   Planned Parenthood    [phone]   [facility]    [phone]   Community [facility] and [facility]  [address], [place] Phone:  [phone], Fax:  [phone] Hours of Operation:  9 am - 6 pm, M-F.  Also accepts Medicaid/Medicare and self-pay.  [facility]  [address], [place] Phone: [phone], Fax: [phone]. Hours of Operation:  8:30 am - 5:30 pm, M-F.  Also accepts Medicaid  and self-pay.  [facility] [place] [address] [place] Phone: [phone]   [facility] [address] Natasha Bence [place], [place] [phone], Ext. [phone] Mondays & Thursdays: 7-9 AM.  First 15 patients are seen on a first come, first serve basis.    Medicaid-accepting [facility] Providers:  Organization         Address  Phone   Notes  [facility] [address], [place] [phone] Also accepts self-pay patients.  [facility] [address] Laurell Josephs [place], [place]  [phone]   [facility] [address], [place] [phone]   [facility] [address], [place] [phone]   Renaye Rakers [address], [place]   [phone] Only accepts [place] patients after they have their name applied to their card.   Self-Pay (no insurance) in [facility]:  Organization         Address  Phone   Notes  Sickle Cell Patients, [facility] [address] [place], [place] [phone]   [facility] [address] [place], [place] [phone]   [facility] [place]  [address], [place] [phone]   [facility]/Dr. Osei-Bonsu  [address], [place] or [address], [place] [phone] Phone number for both [place] locations is the same.  [facility] [address], [place] [phone]   [facility] [address] [place] or [address] [phone]   [facility] [address] [place] [phone], phone; [phone], fax Sees patients 1st and 3rd Saturday of every month.  Must not qualify for public or private insurance (i.e. Medicaid, Medicare, [place] Health Choice, Veterans' Benefits)  Household income should be no more than 200% of the poverty level The clinic cannot treat you if you are pregnant or think you are pregnant  Sexually transmitted diseases are not treated at the clinic.    Dental Care: Organization         Address  Phone  Notes  [facility] Department of [facility] [address] [place], [place] [phone] Accepts children up to age 43 who are enrolled in [place] or [place] Health Choice; pregnant women with a Medicaid card; and children who have applied for Medicaid or [place] Health Choice, but were declined, whose parents can pay a reduced fee at  time of service.  [facility] Department of [facility]  [address], [place] [phone] Accepts children up to age 81 who are enrolled in [place] or [place] Health Choice; pregnant women with a Medicaid card; and children who have applied for Medicaid or  Health Choice, but were declined, whose parents can pay a reduced fee at time of service.  [facility]  [address] [place], [place] [phone] Patients are seen by appointment only. Walk-ins are not accepted. [facility] will see patients 63 years of age and older. Monday - Tuesday (8am-5pm) Most Wednesdays (8:30-5pm) $30 per visit, cash only  [facility]  [address], [facility] [phone] Patients are seen by appointment only. Walk-ins are not accepted. [facility] will see patients  48 years of age and older. One Wednesday Evening (Monthly: Volunteer Based).  $30 per visit, cash only  [employer]  [phone] for adults; Children under age 36, call Graduate Pediatric Dentistry at [phone]. Children aged 72-14, please call [phone] to request a pediatric application.  Dental services are provided in all areas of dental care including fillings, crowns and bridges, complete and partial dentures, implants, gum treatment, root canals, and extractions. Preventive care is also provided. Treatment is provided to both adults and children. Patients are selected via a lottery and there is often a waiting list.   [facility] [address] [place]  [phone] [email or web address]   [facility] [address] [place], [place] [phone], Ext. [phone] Second and Fourth Thursday of each month, opens at 6:30 AM; Clinic ends at 9 AM.  Patients are seen on a first-come first-served basis, and a limited number are seen during each clinic.   [facility]  [address] Ether Griffins  [place], [place] [phone]   Eligibility Requirements You must have lived in [place], [place], or [place] counties for at least the last three months.   You cannot be eligible for state or federal sponsored [place], including [employer], [place], or [employer].   You generally cannot be eligible for healthcare insurance through your employer.    How to apply: Eligibility screenings are held every Tuesday and Wednesday afternoon from 1:00 pm until 4:00 pm. You do not need an appointment for the interview!  [facility] [address] [place], [place] [phone]   [facility] Health Department  [phone]   [facility] Health Department  [phone]   [facility] Health Department  [phone]    Behavioral Health Resources in the Community: Intensive Outpatient Programs Organization         Address  Phone  Notes  [facility] [address], [place], [place] [phone]   [facility] Outpatient [address], [place], [place] [phone]   [facility] [address] [place], [place]  [phone]   [facility] [address],  [place], [place] [phone] or [phone]   Substance Abuse Resources Organization         Address  Phone  Notes  [facility]  [phone]   [facility]  [phone]   [facility]  [phone]   Floydene Flock  [phone]   Residential & Outpatient Substance Abuse Program  [phone]   Psychological Services Organization         Address  Phone  Notes  [facility] Behavioral Health  [phone]   [facility] Services  [phone]   [facility] Mental Health [address], [place] [phone] or [phone]    Mobile Crisis Teams Organization         Address  Phone  Notes  [facility], Mobile Crisis Care Unit  [phone]   [facility] Psych[facility]  Services  [address] [place], [place] [phone]   Doristine Locks [address] [place] [phone]    Self-Help/Support Groups Organization         Address  Phone             Notes  [facility] [place] - variety of support groups  336- I7437963 Call for more information  Narcotics Anonymous (NA), [facility] [address], [employer]  [place]  2 meetings at this location   Residential Treatment Programs Organization         Address  Phone  Notes  [facility] [address]    [place]  [phone]   [facility]  [address] [place] [address], [place], [place] [phone]   [facility] [address] [place], [place] [phone] Admissions: 8am-3pm M-F  [facility] [address],    [place], [place] [phone]   [facility] [address] [place], [place], [place] [phone]   [facility] [address],  [place], [place] [phone]   [facility] - Intensive Outpatient [address] Laurell Josephs [address], [place], [place] [phone]   [facility] ([facility] [address]  [place], [place] [phone] or [phone]   [facility] ([facility]) [address] [place], [place] [phone] Accepts Medicaid  [facility] [address],  [place] [phone] Substance Abuse/Addiction Treatment   [facility] Organization         Address  Phone  Notes  [facility]  [phone]   Angie Fava, PhD [address] Ervin Knack [place], [place]   [phone] or [phone]   [facility] Behavioral   [address] [place], [place] [phone]   [facility] [address], [place], [place] [phone] Insurance/Medicaid/sponsorship through [facility] and Families [address]                                    [place], [place] [phone] Therapy/tele-psych/case  [facility] [address], [place] [phone]    Dr. Lolly Mustache  [phone]   [facility]  [employer] [facility] Dept. 1) [address] [place] 2) [address], [place] 3)  [address], [place] [phone]  [phone]   [facility] Child Abuse Hotline (586)071-2891 or [phone] (After Hours)

## 2015-12-30 NOTE — ED Provider Notes (Signed)
CSN: [id number]     Arrival date & time 12/30/15  2423 History   First MD Initiated Contact with Patient 12/30/15 0754     Chief Complaint  Patient presents with  . Dental Pain     (Consider location/radiation/quality/duration/timing/severity/associated sxs/prior Treatment) HPI   Patient is a 55 year old male presenting today with dental pain. Patient reports that he was chewing on a chicken wing last Sunday when he broke his right lower tooth. Patient reports he's had a lot of pain and had to leave work early. He reports he saw a dentist who recommended that he seen or oral surgeon. He states he could afford the x-ray to oral surgeon and oral surgeon stated that he needed to have his tooth taken out but patient cannot afford to move forward with the plan. So he is here for symptom management.  Past Medical History  Diagnosis Date  . Hypertension   . Humerus fracture     right  . Diabetes mellitus   . Inguinal hernia   . High cholesterol    Past Surgical History  Procedure Laterality Date  . Hernia repair    . Cystoscopy  2015    Biopsy   Family History  Problem Relation Age of Onset  . Diabetes Mother   . Hypertension Mother   . Diabetes Father   . Hypertension Father   . Cancer Brother     brain   Social History  Substance Use Topics  . Smoking status: Current Every Day Smoker -- 1.00 packs/day    Types: Cigarettes  . Smokeless tobacco: Never Used  . Alcohol Use: 0.0 oz/week    0 Standard drinks or equivalent per week     Comment: occasional    Review of Systems  Constitutional: Negative for fever, activity change and fatigue.  HENT: Positive for dental problem.   Respiratory: Negative for shortness of breath.   Cardiovascular: Negative for chest pain.  Gastrointestinal: Negative for abdominal pain.      Allergies  Morphine and related; Oxycontin; and Penicillins  Home Medications   Prior to Admission medications   Medication Sig Start Date End Date  Taking? Authorizing Provider  acetaminophen (TYLENOL) 500 MG tablet Take 1 tablet (500 mg total) by mouth every 6 (six) hours as needed. 10/13/15   Waynetta Pean, PA-C  benzonatate (TESSALON) 100 MG capsule Take 1 capsule (100 mg total) by mouth 3 (three) times daily as needed for cough. 10/13/15   Waynetta Pean, PA-C  Blood Glucose Monitoring Suppl (ONE TOUCH ULTRA SYSTEM KIT) W/DEVICE KIT 1 kit by Does not apply route once. 08/28/15   Jearld Fenton, NP  cetirizine (ZYRTEC ALLERGY) 10 MG tablet Take 1 tablet (10 mg total) by mouth daily. 10/13/15   Waynetta Pean, PA-C  cholecalciferol (VITAMIN D) 1000 UNITS tablet Take 1,000 Units by mouth every other day.     Historical Provider, MD  Cyanocobalamin (VITAMIN B 12 PO) Take 1 tablet by mouth daily.    Historical Provider, MD  Fish Oil-Cholecalciferol (FISH OIL + D3) 1000-1000 MG-UNIT CAPS Take 1 capsule by mouth daily with breakfast.     Historical Provider, MD  gabapentin (NEURONTIN) 300 MG capsule TAKE 1 CAPSULE BY MOUTH 3 TIMES A DAY 08/08/15   Jearld Fenton, NP  glucose blood (ONE TOUCH TEST STRIPS) test strip Use as instructed 08/28/15   Jearld Fenton, NP  ibuprofen (ADVIL,MOTRIN) 200 MG tablet Take 800 mg by mouth every 6 (six) hours as needed for mild  pain or moderate pain.    Historical Provider, MD  L-Arginine 500 MG CAPS Take 2 capsules by mouth daily.     Historical Provider, MD  Lancets [facility] ULTRASOFT) lancets 1 each by Other route 2 (two) times daily. Use as instructed 08/28/15   Jearld Fenton, NP  lisinopril-hydrochlorothiazide (PRINZIDE,ZESTORETIC) 20-25 MG per tablet Take 1 tablet by mouth daily.     Historical Provider, MD  magnesium oxide (MAG-OX) 400 MG tablet Take 800 mg by mouth at bedtime.     Historical Provider, MD  metFORMIN (GLUCOPHAGE) 1000 MG tablet Take 1 tablet (1,000 mg total) by mouth 2 (two) times daily with a meal. MUST SCHEDULE FOLLOW UP OFFICE VISIT 12/23/15   Jearld Fenton, NP  naproxen (NAPROSYN) 375 MG  tablet Take 1 tablet (375 mg total) by mouth 2 (two) times daily as needed. Patient not taking: Reported on 05/27/2015 10/30/14   Elnora Morrison, MD  naproxen sodium (ANAPROX) 220 MG tablet Take 440 mg by mouth 2 (two) times daily as needed (pain).    Historical Provider, MD  omeprazole (PRILOSEC) 20 MG capsule Take 1 capsule (20 mg total) by mouth daily. Patient taking differently: Take 20 mg by mouth daily as needed (acid reflux).  07/09/15   Comer Locket, PA-C  oxyCODONE (OXY IR/ROXICODONE) 5 MG immediate release tablet Take 2.5-5 mg by mouth 3 (three) times daily. 06/19/15   Historical Provider, MD  pregabalin (LYRICA) 50 MG capsule Take 50 mg by mouth 2 (two) times daily.    Historical Provider, MD  simvastatin (ZOCOR) 20 MG tablet Take 1 tablet (20 mg total) by mouth at bedtime. 05/28/15   Jearld Fenton, NP  Taurine 500 MG CAPS Take 1 capsule by mouth daily.    Historical Provider, MD  vitamin C (ASCORBIC ACID) 500 MG tablet Take 500 mg by mouth daily.    Historical Provider, MD   BP 150/92 mmHg  Pulse 96  Temp(Src) 97.6 F (36.4 C) (Oral)  Resp 16  SpO2 98% Physical Exam  Constitutional: He is oriented to person, place, and time. He appears well-nourished.  HENT:  Head: Normocephalic.  Mouth/Throat: Oropharynx is clear and moist.  Most of tooth missing, no surrounding erythema or signs of infection. No abscess noted. Sensitive to cold and heat.  No external swelling.  Eyes: Conjunctivae are normal.  Neck: No tracheal deviation present.  Cardiovascular: Normal rate.   Pulmonary/Chest: Effort normal. No stridor. No respiratory distress.  Neurological: He is oriented to person, place, and time.  Skin: Skin is warm and dry. He is not diaphoretic.  Psychiatric: He has a normal mood and affect. His behavior is normal.  Nursing note and vitals reviewed.   ED Course  Procedures (including critical care time) Labs Review Labs Reviewed - No data to display  Imaging Review No  results found. I have personally reviewed and evaluated these images and lab results as part of my medical decision-making.   EKG Interpretation None      MDM   Final diagnoses:  None   patient is a 55 year old male presenting here with dental problem. Patient was seen by oral surgeon and knows that he needs oral surgeon to complete a surgery. However he can't afford the procedure he needs and states he is here for symptomatic management. We will give him antibiotics and pain control and have him follow-up as planned with oral surgery.  No signs of infection.   Courteney Julio Alm, MD 12/30/15 [phone]

## 2015-12-30 NOTE — ED Notes (Signed)
Pt's R lower tooth broke off on Sunday while biting on a chicken wing. Pt reports pain in tooth area.

## 2016-01-09 ENCOUNTER — Other Ambulatory Visit: Payer: Self-pay

## 2016-01-09 MED ORDER — LISINOPRIL-HYDROCHLOROTHIAZIDE 20-25 MG PO TABS: 1.0000 | ORAL_TABLET | Freq: Every day | ORAL | Status: DC

## 2016-01-09 NOTE — Telephone Encounter (Signed)
[facility] request status of multiple request for refill lisnopril HCTZ from [facility]. [facility] at [facility] said request had been going to [facility] . Pt is out of med; gave refill # 30 and advised [facility] pt needed to schedule f/u appt. [facility] will cb for appt. And she will ck with pharmacy for refill.

## 2016-03-24 ENCOUNTER — Emergency Department (HOSPITAL_COMMUNITY)
Admission: EM | Admit: 2016-03-24 | Discharge: 2016-03-24 | Disposition: A | Discharge status: Home / Self Care | Payer: BLUE CROSS/BLUE SHIELD | Attending: Emergency Medicine | Admitting: Emergency Medicine

## 2016-03-24 ENCOUNTER — Encounter (HOSPITAL_COMMUNITY): Payer: Self-pay

## 2016-03-24 DIAGNOSIS — R197 Diarrhea, unspecified: Secondary | ICD-10-CM | POA: Insufficient documentation

## 2016-03-24 DIAGNOSIS — I10 Essential (primary) hypertension: Secondary | ICD-10-CM | POA: Insufficient documentation

## 2016-03-24 DIAGNOSIS — Z79899 Other long term (current) drug therapy: Secondary | ICD-10-CM | POA: Insufficient documentation

## 2016-03-24 DIAGNOSIS — F17210 Nicotine dependence, cigarettes, uncomplicated: Secondary | ICD-10-CM | POA: Insufficient documentation

## 2016-03-24 DIAGNOSIS — E7800 Pure hypercholesterolemia, unspecified: Secondary | ICD-10-CM | POA: Insufficient documentation

## 2016-03-24 DIAGNOSIS — Z7984 Long term (current) use of oral hypoglycemic drugs: Secondary | ICD-10-CM | POA: Insufficient documentation

## 2016-03-24 DIAGNOSIS — R112 Nausea with vomiting, unspecified: Secondary | ICD-10-CM | POA: Insufficient documentation

## 2016-03-24 DIAGNOSIS — Z8781 Personal history of (healed) traumatic fracture: Secondary | ICD-10-CM | POA: Insufficient documentation

## 2016-03-24 DIAGNOSIS — E119 Type 2 diabetes mellitus without complications: Secondary | ICD-10-CM | POA: Insufficient documentation

## 2016-03-24 DIAGNOSIS — Z8719 Personal history of other diseases of the digestive system: Secondary | ICD-10-CM | POA: Insufficient documentation

## 2016-03-24 DIAGNOSIS — Z880 Allergy status to penicillin: Secondary | ICD-10-CM | POA: Insufficient documentation

## 2016-03-24 LAB — COMPREHENSIVE METABOLIC PANEL
ALT: 39 U/L (ref 17–63)
AST: 27 U/L (ref 15–41)
Albumin: 4 g/dL (ref 3.5–5.0)
Alkaline Phosphatase: 55 U/L (ref 38–126)
Anion gap: 12 (ref 5–15)
BUN: 11 mg/dL (ref 6–20)
CHLORIDE: 104 mmol/L (ref 101–111)
CO2: 25 mmol/L (ref 22–32)
CREATININE: 0.86 mg/dL (ref 0.61–1.24)
Calcium: 9.7 mg/dL (ref 8.9–10.3)
GFR calc Af Amer: >60 mL/min (ref >60)
Glucose, Bld: 232 mg/dL — ABNORMAL HIGH (ref 65–99)
Potassium: 4.1 mmol/L (ref 3.5–5.1)
Sodium: 141 mmol/L (ref 135–145)
Total Bilirubin: 0.5 mg/dL (ref 0.3–1.2)
Total Protein: 7.3 g/dL (ref 6.5–8.1)

## 2016-03-24 LAB — URINALYSIS, ROUTINE W REFLEX MICROSCOPIC
Bilirubin Urine: NEGATIVE
Hgb urine dipstick: NEGATIVE
Ketones, ur: NEGATIVE mg/dL
Leukocytes, UA: NEGATIVE
Nitrite: NEGATIVE
PROTEIN: 30 mg/dL — AB
Specific Gravity, Urine: 1.027 (ref 1.005–1.030)
pH: 6 (ref 5.0–8.0)

## 2016-03-24 LAB — CBC
HCT: 47.2 % (ref 39.0–52.0)
Hemoglobin: 16.7 g/dL (ref 13.0–17.0)
MCH: 31.1 pg (ref 26.0–34.0)
MCHC: 35.4 g/dL (ref 30.0–36.0)
MCV: 87.9 fL (ref 78.0–100.0)
PLATELETS: 300 10*3/uL (ref 150–400)
RBC: 5.37 MIL/uL (ref 4.22–5.81)
RDW: 13.2 % (ref 11.5–15.5)
WBC: 10.5 10*3/uL (ref 4.0–10.5)

## 2016-03-24 LAB — URINE MICROSCOPIC-ADD ON

## 2016-03-24 LAB — LIPASE, BLOOD: LIPASE: 30 U/L (ref 11–51)

## 2016-03-24 MED ORDER — HYOSCYAMINE SULFATE 0.125 MG PO TABS
0.1250 mg | ORAL_TABLET | Freq: Once | ORAL | Status: AC
  Administered 2016-03-24: 0.125 mg via ORAL
  Filled 2016-03-24: qty 1

## 2016-03-24 MED ORDER — ONDANSETRON 4 MG PO TBDP
4.0000 mg | ORAL_TABLET | Freq: Once | ORAL | Status: AC
  Administered 2016-03-24: 4 mg via ORAL
  Filled 2016-03-24: qty 1

## 2016-03-24 MED ORDER — ONDANSETRON HCL 4 MG PO TABS: 4.0000 mg | ORAL_TABLET | Freq: Four times a day (QID) | ORAL | Status: DC

## 2016-03-24 NOTE — ED Notes (Signed)
Pt given water 

## 2016-03-24 NOTE — ED Notes (Signed)
Bed: ZH08WA19 Expected date:  Expected time:  Means of arrival:  Comments: TR 6 when clean

## 2016-03-24 NOTE — ED Notes (Signed)
Patient states he has been having vomiting and diarrhea since 0100 today. Patient denies any blood in his stool or vomit.

## 2016-03-24 NOTE — ED Provider Notes (Signed)
CSN: [id number]     Arrival date & time 03/24/16  1256 History   First MD Initiated Contact with Patient 03/24/16 1413     Chief Complaint  Patient presents with  . Emesis  . Diarrhea     Patient is a 55 y.o. male presenting with vomiting and diarrhea. The history is provided by the patient. No language interpreter was used.  Emesis Associated symptoms: diarrhea   Diarrhea Associated symptoms: vomiting    Treyveon Mochizuki is a 55 y.o. male who presents to the Emergency Department complaining of vomiting, diarrhea.  He reports vomiting and diarrhea that started this morning. Today he had 2 episodes of emesis and 4 episodes of diarrhea. He feels uncomfortable in his stomach without any abdominal pain. He denies any fevers, sick contacts, recent antibiotic exposure. He has a history of diabetes and hypertension. Symptoms are moderate and constant in nature.  Overall symptoms are improving.  Past Medical History  Diagnosis Date  . Hypertension   . Humerus fracture     right  . Diabetes mellitus   . Inguinal hernia   . High cholesterol    Past Surgical History  Procedure Laterality Date  . Hernia repair    . Cystoscopy  2015    Biopsy   Family History  Problem Relation Age of Onset  . Diabetes Mother   . Hypertension Mother   . Diabetes Father   . Hypertension Father   . Cancer Brother     brain   Social History  Substance Use Topics  . Smoking status: Current Every Day Smoker -- 1.00 packs/day    Types: Cigarettes  . Smokeless tobacco: Never Used  . Alcohol Use: 0.0 oz/week    0 Standard drinks or equivalent per week     Comment: occasional    Review of Systems  Gastrointestinal: Positive for vomiting and diarrhea.  All other systems reviewed and are negative.     Allergies  Morphine and related; Oxycontin; and Penicillins  Home Medications   Prior to Admission medications   Medication Sig Start Date End Date Taking? Authorizing Provider   acetaminophen (TYLENOL) 500 MG tablet Take 1 tablet (500 mg total) by mouth every 6 (six) hours as needed. 10/13/15  Yes Waynetta Pean, PA-C  cholecalciferol (VITAMIN D) 1000 UNITS tablet Take 1,000 Units by mouth every other day.    Yes Historical Provider, MD  Cyanocobalamin (VITAMIN B 12 PO) Take 1 tablet by mouth daily.   Yes Historical Provider, MD  Fish Oil-Cholecalciferol (FISH OIL + D3) 1000-1000 MG-UNIT CAPS Take 1 capsule by mouth daily with breakfast.    Yes Historical Provider, MD  gabapentin (NEURONTIN) 300 MG capsule TAKE 1 CAPSULE BY MOUTH 3 TIMES A DAY 08/08/15  Yes Jearld Fenton, NP  hydrocodone-ibuprofen (VICOPROFEN) 5-200 MG tablet Take 2 tablets by mouth every 8 (eight) hours as needed for pain.   Yes Historical Provider, MD  L-Arginine 500 MG CAPS Take 2 capsules by mouth daily.    Yes Historical Provider, MD  lisinopril (PRINIVIL,ZESTRIL) 40 MG tablet Take 40 mg by mouth daily.   Yes Historical Provider, MD  magnesium oxide (MAG-OX) 400 MG tablet Take 800 mg by mouth at bedtime.    Yes Historical Provider, MD  metFORMIN (GLUCOPHAGE) 1000 MG tablet Take 1 tablet (1,000 mg total) by mouth 2 (two) times daily with a meal. MUST SCHEDULE FOLLOW UP OFFICE VISIT Patient taking differently: Take 1,000 mg by mouth daily. MUST SCHEDULE FOLLOW UP OFFICE  VISIT 12/23/15  Yes Regina W Baity, NP  pregabalin (LYRICA) 50 MG capsule Take 100 mg by mouth daily as needed (pain).    Yes Historical Provider, MD  simvastatin (ZOCOR) 20 MG tablet Take 1 tablet (20 mg total) by mouth at bedtime. 05/28/15  Yes Regina W Baity, NP  Taurine 500 MG CAPS Take 1 capsule by mouth daily.   Yes Historical Provider, MD  vitamin C (ASCORBIC ACID) 500 MG tablet Take 500 mg by mouth daily.   Yes Historical Provider, MD  benzonatate (TESSALON) 100 MG capsule Take 1 capsule (100 mg total) by mouth 3 (three) times daily as needed for cough. 10/13/15   William Dansie, PA-C  Blood Glucose Monitoring Suppl (ONE TOUCH  ULTRA SYSTEM KIT) W/DEVICE KIT 1 kit by Does not apply route once. 08/28/15   Regina W Baity, NP  cetirizine (ZYRTEC ALLERGY) 10 MG tablet Take 1 tablet (10 mg total) by mouth daily. 10/13/15   William Dansie, PA-C  glucose blood (ONE TOUCH TEST STRIPS) test strip Use as instructed 08/28/15   Regina W Baity, NP  Lancets (ONETOUCH ULTRASOFT) lancets 1 each by Other route 2 (two) times daily. Use as instructed 08/28/15   Regina W Baity, NP  lisinopril-hydrochlorothiazide (PRINZIDE,ZESTORETIC) 20-25 MG tablet Take 1 tablet by mouth daily. 01/09/16   Regina W Baity, NP  ondansetron (ZOFRAN) 4 MG tablet Take 1 tablet (4 mg total) by mouth every 6 (six) hours. 03/24/16   Elizabeth Rees, MD  oxyCODONE-acetaminophen (PERCOCET/ROXICET) 5-325 MG tablet Take 1 tablet by mouth every 6 (six) hours as needed for severe pain. 12/30/15   Courteney Lyn Mackuen, MD   BP 161/104 mmHg  Pulse 100  Temp(Src) 98.3 F (36.8 C) (Oral)  Resp 17  SpO2 96% Physical Exam  Constitutional: He is oriented to person, place, and time. He appears well-developed and well-nourished.  HENT:  Head: Normocephalic and atraumatic.  Cardiovascular: Normal rate and regular rhythm.   No murmur heard. Pulmonary/Chest: Effort normal and breath sounds normal. No respiratory distress.  Abdominal: Soft. There is no tenderness. There is no rebound and no guarding.  Musculoskeletal: He exhibits no edema or tenderness.  Neurological: He is alert and oriented to person, place, and time.  Skin: Skin is warm and dry.  Psychiatric: He has a normal mood and affect. His behavior is normal.  Nursing note and vitals reviewed.   ED Course  Procedures (including critical care time) Labs Review Labs Reviewed  COMPREHENSIVE METABOLIC PANEL - Abnormal; Notable for the following:    Glucose, Bld 232 (*)    All other components within normal limits  URINALYSIS, ROUTINE W REFLEX MICROSCOPIC (NOT AT [facility]) - Abnormal; Notable for the following:     Glucose, UA >1000 (*)    Protein, ur 30 (*)    All other components within normal limits  URINE MICROSCOPIC-ADD ON - Abnormal; Notable for the following:    Squamous Epithelial / LPF 0-5 (*)    Bacteria, UA RARE (*)    All other components within normal limits  LIPASE, BLOOD  CBC    Imaging Review No results found. I have personally reviewed and evaluated these images and lab results as part of my medical decision-making.   EKG Interpretation None      MDM   Final diagnoses:  Nausea vomiting and diarrhea    Patient here for evaluation of vomiting and diarrhea. He has no significant abdominal pain or abdominal tenderness on examination. He is nontoxic. Well-hydrated. Presentation is   not consistent with cholecystitis, pancreatitis, appendicitis, seroius bacterial infection. Discussed with patient home care for likely gastroenteritis. Discussed oral fluid hydration, when necessary Zofran, outpatient follow-up, return precautions.  Elizabeth Rees, MD 03/24/16 1525 

## 2016-03-24 NOTE — Discharge Instructions (Signed)

## 2016-04-26 ENCOUNTER — Emergency Department (HOSPITAL_COMMUNITY): Payer: Self-pay

## 2016-04-26 ENCOUNTER — Emergency Department (HOSPITAL_COMMUNITY)
Admission: EM | Admit: 2016-04-26 | Discharge: 2016-04-26 | Disposition: A | Discharge status: Home / Self Care | Payer: Self-pay | Attending: Emergency Medicine | Admitting: Emergency Medicine

## 2016-04-26 ENCOUNTER — Encounter (HOSPITAL_COMMUNITY): Payer: Self-pay | Admitting: Emergency Medicine

## 2016-04-26 DIAGNOSIS — Z791 Long term (current) use of non-steroidal anti-inflammatories (NSAID): Secondary | ICD-10-CM | POA: Insufficient documentation

## 2016-04-26 DIAGNOSIS — E119 Type 2 diabetes mellitus without complications: Secondary | ICD-10-CM | POA: Insufficient documentation

## 2016-04-26 DIAGNOSIS — Y92814 Boat as the place of occurrence of the external cause: Secondary | ICD-10-CM | POA: Insufficient documentation

## 2016-04-26 DIAGNOSIS — I10 Essential (primary) hypertension: Secondary | ICD-10-CM | POA: Insufficient documentation

## 2016-04-26 DIAGNOSIS — Y939 Activity, unspecified: Secondary | ICD-10-CM | POA: Insufficient documentation

## 2016-04-26 DIAGNOSIS — Z79891 Long term (current) use of opiate analgesic: Secondary | ICD-10-CM | POA: Insufficient documentation

## 2016-04-26 DIAGNOSIS — W1840XA Slipping, tripping and stumbling without falling, unspecified, initial encounter: Secondary | ICD-10-CM | POA: Insufficient documentation

## 2016-04-26 DIAGNOSIS — F17210 Nicotine dependence, cigarettes, uncomplicated: Secondary | ICD-10-CM | POA: Insufficient documentation

## 2016-04-26 DIAGNOSIS — E7800 Pure hypercholesterolemia, unspecified: Secondary | ICD-10-CM | POA: Insufficient documentation

## 2016-04-26 DIAGNOSIS — Y998 Other external cause status: Secondary | ICD-10-CM | POA: Insufficient documentation

## 2016-04-26 DIAGNOSIS — Z7984 Long term (current) use of oral hypoglycemic drugs: Secondary | ICD-10-CM | POA: Insufficient documentation

## 2016-04-26 DIAGNOSIS — R1031 Right lower quadrant pain: Secondary | ICD-10-CM | POA: Insufficient documentation

## 2016-04-26 DIAGNOSIS — Z79899 Other long term (current) drug therapy: Secondary | ICD-10-CM | POA: Insufficient documentation

## 2016-04-26 LAB — URINALYSIS, ROUTINE W REFLEX MICROSCOPIC
Bilirubin Urine: NEGATIVE
GLUCOSE, UA: 250 mg/dL — AB
Hgb urine dipstick: NEGATIVE
KETONES UR: NEGATIVE mg/dL
LEUKOCYTES UA: NEGATIVE
Nitrite: NEGATIVE
PROTEIN: 30 mg/dL — AB
Specific Gravity, Urine: 1.016 (ref 1.005–1.030)
pH: 5.5 (ref 5.0–8.0)

## 2016-04-26 LAB — COMPREHENSIVE METABOLIC PANEL
ALBUMIN: 4.6 g/dL (ref 3.5–5.0)
ALK PHOS: 61 U/L (ref 38–126)
ALT: 32 U/L (ref 17–63)
ANION GAP: 8 (ref 5–15)
AST: 25 U/L (ref 15–41)
BILIRUBIN TOTAL: 0.4 mg/dL (ref 0.3–1.2)
BUN: 10 mg/dL (ref 6–20)
CALCIUM: 9.7 mg/dL (ref 8.9–10.3)
CO2: 25 mmol/L (ref 22–32)
Chloride: 106 mmol/L (ref 101–111)
Creatinine, Ser: 0.8 mg/dL (ref 0.61–1.24)
GLUCOSE: 143 mg/dL — AB (ref 65–99)
Potassium: 4.6 mmol/L (ref 3.5–5.1)
Sodium: 139 mmol/L (ref 135–145)
TOTAL PROTEIN: 7.7 g/dL (ref 6.5–8.1)

## 2016-04-26 LAB — URINE MICROSCOPIC-ADD ON: SQUAMOUS EPITHELIAL / LPF: NONE SEEN

## 2016-04-26 LAB — CBC WITH DIFFERENTIAL/PLATELET
Basophils Absolute: 0.1 10*3/uL (ref 0.0–0.1)
Basophils Relative: 1 %
Eosinophils Absolute: 0.2 10*3/uL (ref 0.0–0.7)
Eosinophils Relative: 1 %
HEMATOCRIT: 49.9 % (ref 39.0–52.0)
HEMOGLOBIN: 17.8 g/dL — AB (ref 13.0–17.0)
LYMPHS ABS: 2.7 10*3/uL (ref 0.7–4.0)
LYMPHS PCT: 22 %
MCH: 31.4 pg (ref 26.0–34.0)
MCHC: 35.7 g/dL (ref 30.0–36.0)
MCV: 88 fL (ref 78.0–100.0)
MONO ABS: 0.8 10*3/uL (ref 0.1–1.0)
MONOS PCT: 6 %
NEUTROS ABS: 8.7 10*3/uL — AB (ref 1.7–7.7)
NEUTROS PCT: 70 %
Platelets: 290 10*3/uL (ref 150–400)
RBC: 5.67 MIL/uL (ref 4.22–5.81)
RDW: 13.3 % (ref 11.5–15.5)
WBC: 12.4 10*3/uL — ABNORMAL HIGH (ref 4.0–10.5)

## 2016-04-26 LAB — I-STAT CG4 LACTIC ACID, ED: Lactic Acid, Venous: 2.05 mmol/L (ref 0.5–2.0)

## 2016-04-26 MED ORDER — IOPAMIDOL (ISOVUE-300) INJECTION 61%
100.0000 mL | Freq: Once | INTRAVENOUS | Status: AC | PRN
  Administered 2016-04-26: 100 mL via INTRAVENOUS

## 2016-04-26 MED ORDER — OXYCODONE-ACETAMINOPHEN 5-325 MG PO TABS: 1.0000 | ORAL_TABLET | Freq: Four times a day (QID) | ORAL | Status: DC | PRN

## 2016-04-26 MED ORDER — HYDROMORPHONE HCL 1 MG/ML IJ SOLN
0.5000 mg | Freq: Once | INTRAMUSCULAR | Status: AC
  Administered 2016-04-26: 0.5 mg via INTRAVENOUS
  Filled 2016-04-26: qty 1

## 2016-04-26 NOTE — Discharge Instructions (Signed)
You were seen and evaluated today for your right groin pain. You have hernias bilaterally in your inguinal canals both are benign at this time. If she continued to have pain follow-up with your surgeon outpatient. Take the pain medication as needed.  Hernia, Adult A hernia is the bulging of an organ or tissue through a weak spot in the muscles of the abdomen (abdominal wall). Hernias develop most often near the navel or groin. There are many kinds of hernias. Common kinds include:  Femoral hernia. This kind of hernia develops under the groin in the upper thigh area.  Inguinal hernia. This kind of hernia develops in the groin or scrotum.  Umbilical hernia. This kind of hernia develops near the navel.  Hiatal hernia. This kind of hernia causes part of the stomach to be pushed up into the chest.  Incisional hernia. This kind of hernia bulges through a scar from an abdominal surgery. CAUSES This condition may be caused by:  Heavy lifting.  Coughing over a long period of time.  Straining to have a bowel movement.  An incision made during an abdominal surgery.  A birth defect (congenital defect).  Excess weight or obesity.  Smoking.  Poor nutrition.  Cystic fibrosis.  Excess fluid in the abdomen.  Undescended testicles. SYMPTOMS Symptoms of a hernia include:  A lump on the abdomen. This is the first sign of a hernia. The lump may become more obvious with standing, straining, or coughing. It may get bigger over time if it is not treated or if the condition causing it is not treated.  Pain. A hernia is usually painless, but it may become painful over time if treatment is delayed. The pain is usually dull and may get worse with standing or lifting heavy objects. Sometimes a hernia gets tightly squeezed in the weak spot (strangulated) or stuck there (incarcerated) and causes additional symptoms. These symptoms may  include:  Vomiting.  Nausea.  Constipation.  Irritability. DIAGNOSIS A hernia may be diagnosed with:  A physical exam. During the exam your health care provider may ask you to cough or to make a specific movement, because a hernia is usually more visible when you move.  Imaging tests. These can include:  X-rays.  Ultrasound.  CT scan. TREATMENT A hernia that is small and painless may not need to be treated. A hernia that is large or painful may be treated with surgery. Inguinal hernias may be treated with surgery to prevent incarceration or strangulation. Strangulated hernias are always treated with surgery, because lack of blood to the trapped organ or tissue can cause it to die. Surgery to treat a hernia involves pushing the bulge back into place and repairing the weak part of the abdomen. HOME CARE INSTRUCTIONS  Avoid straining.  Do not lift anything heavier than 10 lb (4.5 kg).  Lift with your leg muscles, not your back muscles. This helps avoid strain.  When coughing, try to cough gently.  Prevent constipation. Constipation leads to straining with bowel movements, which can make a hernia worse or cause a hernia repair to break down. You can prevent constipation by:  Eating a high-fiber diet that includes plenty of fruits and vegetables.  Drinking enough fluids to keep your urine clear or pale yellow. Aim to drink 6-8 glasses of water per day.  Using a stool softener as directed by your health care provider.  Lose weight, if you are overweight.  Do not use any tobacco products, including cigarettes, chewing tobacco, or electronic  cigarettes. If you need help quitting, ask your health care provider.  Keep all follow-up visits as directed by your health care provider. This is important. Your health care provider may need to monitor your condition. SEEK MEDICAL CARE IF:  You have swelling, redness, and pain in the affected area.  Your bowel habits change. SEEK  IMMEDIATE MEDICAL CARE IF:  You have a fever.  You have abdominal pain that is getting worse.  You feel nauseous or you vomit.  You cannot push the hernia back in place by gently pressing on it while you are lying down.  The hernia:  Changes in shape or size.  Is stuck outside the abdomen.  Becomes discolored.  Feels hard or tender.   This information is not intended to replace advice given to you by your health care provider. Make sure you discuss any questions you have with your health care provider.   Document Released: 11/29/2005 Document Revised: 12/20/2014 Document Reviewed: 10/09/2014 Elsevier Interactive Patient Education [employer]2016 [employer] Inc[employer]

## 2016-04-26 NOTE — ED Notes (Signed)
Elevated lab result MD Silverio LayYao have been made aware

## 2016-04-26 NOTE — ED Notes (Addendum)
Pt c/o right lower abdominal pain after slipping in his boat Sunday evening. Hx of hernia. Pt reports he "just wants to be checked out." Pt states this is typical hernia pain. Pt took Ibuprofen at 0200.

## 2016-04-26 NOTE — ED Provider Notes (Signed)
CSN: [id number]     Arrival date & time 04/26/16  5697 History   First MD Initiated Contact with Patient 04/26/16 [phone]     Chief Complaint  Patient presents with  . Hernia     (Consider location/radiation/quality/duration/timing/severity/associated sxs/prior Treatment) HPI Comments: 56 year old male with history of right inguinal hernia status post repair, hypertension presents for right groin pain. The patient reports that he was boating yesterday and that he slipped and tried to catch himself and stretched his right leg. He said since that time he's had increased pain in his right groin area that kept him up all night. He reports normal urination. No nausea or vomiting. No constipation or diarrhea. No fevers or chills. At that he has pain in this region normally but never this severe and feels like it's stretched his hernia.   Past Medical History  Diagnosis Date  . Hypertension   . Humerus fracture     right  . Diabetes mellitus   . Inguinal hernia   . High cholesterol    Past Surgical History  Procedure Laterality Date  . Hernia repair    . Cystoscopy  2015    Biopsy   Family History  Problem Relation Age of Onset  . Diabetes Mother   . Hypertension Mother   . Diabetes Father   . Hypertension Father   . Cancer Brother     brain   Social History  Substance Use Topics  . Smoking status: Current Every Day Smoker -- 1.00 packs/day    Types: Cigarettes  . Smokeless tobacco: Never Used  . Alcohol Use: 0.0 oz/week    0 Standard drinks or equivalent per week     Comment: occasional    Review of Systems  Constitutional: Negative for fever, chills and fatigue.  HENT: Negative for congestion, postnasal drip, rhinorrhea and sinus pressure.   Eyes: Negative for visual disturbance.  Respiratory: Negative for cough, chest tightness, shortness of breath and wheezing.   Cardiovascular: Negative for chest pain and palpitations.  Gastrointestinal: Positive for abdominal pain.  Negative for nausea, vomiting, diarrhea, constipation and blood in stool.  Genitourinary: Negative for dysuria, hematuria, flank pain, penile swelling, scrotal swelling, genital sores, penile pain and testicular pain.  Musculoskeletal: Negative for myalgias and back pain.  Skin: Negative for rash.  Neurological: Negative for dizziness, weakness, light-headedness and headaches.  Hematological: Does not bruise/bleed easily.      Allergies  Morphine and related; Oxycontin; and Penicillins  Home Medications   Prior to Admission medications   Medication Sig Start Date End Date Taking? Authorizing Provider  acetaminophen (TYLENOL) 500 MG tablet Take 1 tablet (500 mg total) by mouth every 6 (six) hours as needed. Patient taking differently: Take 1,500-2,000 mg by mouth every 6 (six) hours as needed for mild pain, moderate pain, fever or headache.  10/13/15  Yes Waynetta Pean, PA-C  Blood Glucose Monitoring Suppl (ONE TOUCH ULTRA SYSTEM KIT) W/DEVICE KIT 1 kit by Does not apply route once. 08/28/15  Yes Jearld Fenton, NP  cetirizine (ZYRTEC ALLERGY) 10 MG tablet Take 1 tablet (10 mg total) by mouth daily. Patient taking differently: Take 10 mg by mouth daily as needed for allergies.  10/13/15  Yes Waynetta Pean, PA-C  cholecalciferol (VITAMIN D) 1000 UNITS tablet Take 1,000 Units by mouth every other day.    Yes Historical Provider, MD  Cyanocobalamin (VITAMIN B 12 PO) Take 1 tablet by mouth daily.   Yes Historical Provider, MD  Fish Oil-Cholecalciferol (FISH OIL +  D3) 1000-1000 MG-UNIT CAPS Take 1 capsule by mouth daily with breakfast.    Yes Historical Provider, MD  gabapentin (NEURONTIN) 300 MG capsule TAKE 1 CAPSULE BY MOUTH 3 TIMES A DAY 08/08/15  Yes Jearld Fenton, NP  glucose blood (ONE TOUCH TEST STRIPS) test strip Use as instructed 08/28/15  Yes Jearld Fenton, NP  hydrocodone-ibuprofen (VICOPROFEN) 5-200 MG tablet Take 2 tablets by mouth every 8 (eight) hours as needed for pain.  Reported on 04/26/2016   Yes Historical Provider, MD  ibuprofen (ADVIL,MOTRIN) 200 MG tablet Take 1,000 mg by mouth every 6 (six) hours as needed for fever, headache, mild pain, moderate pain or cramping.   Yes Historical Provider, MD  L-Arginine 500 MG CAPS Take 2 capsules by mouth daily.    Yes Historical Provider, MD  Lancets [facility] ULTRASOFT) lancets 1 each by Other route 2 (two) times daily. Use as instructed 08/28/15  Yes Jearld Fenton, NP  lisinopril (PRINIVIL,ZESTRIL) 40 MG tablet Take 40 mg by mouth daily.   Yes Historical Provider, MD  lisinopril-hydrochlorothiazide (PRINZIDE,ZESTORETIC) 20-25 MG tablet Take 1 tablet by mouth daily. 01/09/16  Yes Jearld Fenton, NP  magnesium oxide (MAG-OX) 400 MG tablet Take 800 mg by mouth at bedtime.    Yes Historical Provider, MD  metFORMIN (GLUCOPHAGE) 1000 MG tablet Take 1 tablet (1,000 mg total) by mouth 2 (two) times daily with a meal. MUST SCHEDULE FOLLOW UP OFFICE VISIT Patient taking differently: Take 1,000 mg by mouth daily. MUST SCHEDULE FOLLOW UP OFFICE VISIT 12/23/15  Yes Jearld Fenton, NP  ondansetron (ZOFRAN) 4 MG tablet Take 1 tablet (4 mg total) by mouth every 6 (six) hours. 03/24/16  Yes Quintella Reichert, MD  pregabalin (LYRICA) 50 MG capsule Take 100 mg by mouth daily as needed (pain).    Yes Historical Provider, MD  simvastatin (ZOCOR) 20 MG tablet Take 1 tablet (20 mg total) by mouth at bedtime. 05/28/15  Yes Jearld Fenton, NP  Taurine 500 MG CAPS Take 1 capsule by mouth daily.   Yes Historical Provider, MD  vitamin C (ASCORBIC ACID) 500 MG tablet Take 500 mg by mouth daily.   Yes Historical Provider, MD  benzonatate (TESSALON) 100 MG capsule Take 1 capsule (100 mg total) by mouth 3 (three) times daily as needed for cough. Patient not taking: Reported on 04/26/2016 10/13/15   Waynetta Pean, PA-C  oxyCODONE-acetaminophen (PERCOCET/ROXICET) 5-325 MG tablet Take 1 tablet by mouth every 6 (six) hours as needed for severe pain. 04/26/16    Harvel Quale, MD   BP 176/115 mmHg  Pulse 85  Temp(Src) 98.2 F (36.8 C) (Oral)  Resp 18  SpO2 97% Physical Exam  Constitutional: He is oriented to person, place, and time. He appears well-developed and well-nourished. No distress.  HENT:  Head: Normocephalic and atraumatic.  Right Ear: External ear normal.  Left Ear: External ear normal.  Mouth/Throat: Oropharynx is clear and moist. No oropharyngeal exudate.  Eyes: EOM are normal. Pupils are equal, round, and reactive to light.  Neck: Normal range of motion. Neck supple.  Cardiovascular: Normal rate, regular rhythm, normal heart sounds and intact distal pulses.   No murmur heard. Pulmonary/Chest: Effort normal. No respiratory distress. He has no wheezes. He has no rales.  Abdominal: Soft. He exhibits no distension. There is tenderness (RLQ). There is no CVA tenderness. No hernia. Hernia confirmed negative in the right inguinal area and confirmed negative in the left inguinal area.  Musculoskeletal: He exhibits no edema.  Neurological: He is alert and oriented to person, place, and time.  Skin: Skin is warm and dry. No rash noted. He is not diaphoretic.  Vitals reviewed.   ED Course  Procedures (including critical care time) Labs Review Labs Reviewed  CBC WITH DIFFERENTIAL/PLATELET - Abnormal; Notable for the following:    WBC 12.4 (*)    Hemoglobin 17.8 (*)    Neutro Abs 8.7 (*)    All other components within normal limits  COMPREHENSIVE METABOLIC PANEL - Abnormal; Notable for the following:    Glucose, Bld 143 (*)    All other components within normal limits  URINALYSIS, ROUTINE W REFLEX MICROSCOPIC (NOT AT [facility]) - Abnormal; Notable for the following:    Glucose, UA 250 (*)    Protein, ur 30 (*)    All other components within normal limits  URINE MICROSCOPIC-ADD ON - Abnormal; Notable for the following:    Bacteria, UA RARE (*)    All other components within normal limits  I-STAT CG4 LACTIC ACID, ED - Abnormal;  Notable for the following:    Lactic Acid, Venous 2.05 (*)    All other components within normal limits  I-STAT CG4 LACTIC ACID, ED    Imaging Review Ct Abdomen Pelvis W Contrast  04/26/2016  CLINICAL DATA:  Pain following fall 1 day prior with right lower extremity/inguinal pain EXAM: CT ABDOMEN AND PELVIS WITH CONTRAST TECHNIQUE: Multidetector CT imaging of the abdomen and pelvis was performed using the standard protocol following bolus administration of intravenous contrast. CONTRAST:  152m ISOVUE-300 IOPAMIDOL (ISOVUE-300) INJECTION 61% COMPARISON:  May 15, 2014 FINDINGS: Lower chest: There is slight bibasilar lung atelectatic change. Lung bases otherwise are clear. Hepatobiliary: Liver appears intact without laceration or rupture. There is no perihepatic fluid. There is borderline hepatic steatosis. No focal liver lesions are identified. Gallbladder wall is not appreciably thickened. There is no biliary duct dilatation. Pancreas: No pancreatic mass or inflammatory focus is evident. No peripancreatic inflammation or evidence of pancreatic trauma. Spleen: Spleen is intact without laceration or rupture. No perisplenic splenic fluid collections are identified. Small accessory spleens noted. No focal splenic lesions are identified. Adrenals/Urinary Tract: Right adrenal appears normal. There is a stable 1.3 x 1.1 cm left adrenal adenoma. Kidneys bilaterally show no mass or hydronephrosis. No perinephric stranding or fluid. No renal laceration or rupture. No contrast extravasation noted. There is a 3 x 2 mm calculus in the medial mid right kidney. No other renal calculi are identified on either side. There is no ureteral calculus on either side. Urinary bladder is midline with wall thickness within normal limits. Stomach/Bowel: Rectum is mildly distended with air in stool. There is no bowel wall or mesenteric thickening. No bowel obstruction. No free air or portal venous air. Vascular/Lymphatic: There is no  abdominal aortic aneurysm. There are scattered foci of atherosclerotic calcification in the distal aorta and common iliac arteries bilaterally. The major mesenteric vessels appear patent. There is no appreciable adenopathy in the abdomen or pelvis. Reproductive: There are multiple prostatic calculi. Prostate and seminal vesicles are normal in size and contour. There is no pelvic mass or pelvic fluid collection. Other: Appendix appears normal. There is fat in each inguinal ring. No bowel containing hernia appreciable on this study. No ascites or abscess in the abdomen or pelvis. There is no intraperitoneal or retroperitoneal fluid collection. Musculoskeletal: There are no demonstrable fractures. There are no blastic or lytic bone lesions. There is degenerative change at multiple levels in the lower thoracic and  lumbar spine. There is no intramuscular or abdominal wall lesion. IMPRESSION: There is fat in each inguinal ring.  No bowel containing hernia. No traumatic appearing lesion identified. Borderline hepatic steatosis without focal liver lesion. Small left adrenal adenoma, a benign finding. 3 mm nonobstructing calculus medial mid right kidney. No hydronephrosis or ureteral calculus on either side. There are multiple prostatic calculi noted. Electronically Signed   By: Lowella Grip III M.D.   On: 04/26/2016 13:00   I have personally reviewed and evaluated these images and lab results as part of my medical decision-making.   EKG Interpretation None      MDM  Patient was seen and evaluated in stable condition. Benign examination but reporting increased pain in his right inguinal canal where he had his previous hernia repair.  CT without acute process. Patient well-appearing. He was given a dose of pain medication. He was sent home with a prescription for pain medicine and instruction of follow-up with his surgeon and primary care physician. Final diagnoses:  Right groin pain    1. Right lower  quadrant/groin pain    Harvel Quale, MD 04/26/16 1320

## 2016-06-07 ENCOUNTER — Emergency Department
Admission: EM | Admit: 2016-06-07 | Discharge: 2016-06-07 | Disposition: A | Discharge status: Home / Self Care | Payer: BLUE CROSS/BLUE SHIELD | Attending: Emergency Medicine | Admitting: Emergency Medicine

## 2016-06-07 ENCOUNTER — Encounter (HOSPITAL_COMMUNITY): Payer: Self-pay | Admitting: Emergency Medicine

## 2016-06-07 ENCOUNTER — Emergency Department (HOSPITAL_COMMUNITY)
Admission: EM | Admit: 2016-06-07 | Discharge: 2016-06-07 | Disposition: A | Discharge status: Home / Self Care | Payer: BLUE CROSS/BLUE SHIELD | Attending: Emergency Medicine | Admitting: Emergency Medicine

## 2016-06-07 DIAGNOSIS — R197 Diarrhea, unspecified: Secondary | ICD-10-CM | POA: Insufficient documentation

## 2016-06-07 DIAGNOSIS — Z5321 Procedure and treatment not carried out due to patient leaving prior to being seen by health care provider: Secondary | ICD-10-CM | POA: Insufficient documentation

## 2016-06-07 DIAGNOSIS — R1110 Vomiting, unspecified: Secondary | ICD-10-CM | POA: Insufficient documentation

## 2016-06-07 LAB — URINALYSIS COMPLETE WITH MICROSCOPIC (ARMC ONLY)
Bacteria, UA: NONE SEEN
Bilirubin Urine: NEGATIVE
Glucose, UA: >500 mg/dL — AB
HGB URINE DIPSTICK: NEGATIVE
LEUKOCYTES UA: NEGATIVE
Nitrite: NEGATIVE
PH: 6 (ref 5.0–8.0)
PROTEIN: 100 mg/dL — AB
RBC / HPF: NONE SEEN RBC/hpf (ref 0–5)
SPECIFIC GRAVITY, URINE: 1.024 (ref 1.005–1.030)
Squamous Epithelial / LPF: NONE SEEN
WBC UA: NONE SEEN WBC/hpf (ref 0–5)

## 2016-06-07 LAB — CBC
HCT: 49.5 % (ref 40.0–52.0)
HEMOGLOBIN: 17.1 g/dL (ref 13.0–18.0)
MCH: 30.6 pg (ref 26.0–34.0)
MCHC: 34.6 g/dL (ref 32.0–36.0)
MCV: 88.5 fL (ref 80.0–100.0)
PLATELETS: 278 10*3/uL (ref 150–440)
RBC: 5.59 MIL/uL (ref 4.40–5.90)
RDW: 13.5 % (ref 11.5–14.5)
WBC: 12 10*3/uL — AB (ref 3.8–10.6)

## 2016-06-07 LAB — COMPREHENSIVE METABOLIC PANEL
ALK PHOS: 63 U/L (ref 38–126)
ALT: 44 U/L (ref 17–63)
ANION GAP: 11 (ref 5–15)
AST: 31 U/L (ref 15–41)
Albumin: 4.5 g/dL (ref 3.5–5.0)
BUN: 17 mg/dL (ref 6–20)
CALCIUM: 9.7 mg/dL (ref 8.9–10.3)
CHLORIDE: 100 mmol/L — AB (ref 101–111)
CO2: 23 mmol/L (ref 22–32)
Creatinine, Ser: 0.82 mg/dL (ref 0.61–1.24)
Glucose, Bld: 228 mg/dL — ABNORMAL HIGH (ref 65–99)
Potassium: 3.7 mmol/L (ref 3.5–5.1)
SODIUM: 134 mmol/L — AB (ref 135–145)
Total Bilirubin: 0.5 mg/dL (ref 0.3–1.2)
Total Protein: 7.9 g/dL (ref 6.5–8.1)

## 2016-06-07 LAB — LIPASE, BLOOD: LIPASE: 27 U/L (ref 11–51)

## 2016-06-07 NOTE — ED Notes (Signed)
Pt states he pulled a tick off of his right LE last night and since early this morning had a HA with N/V/D.Marland Kitchen. States the vomiting has stopped and the diarrhea has improved since this morning..Marland Kitchen

## 2016-06-07 NOTE — ED Notes (Signed)
Pt sts he removed a tick from the back of his R leg last night and about 1 am this morning he began to have diarrhea, headaches, nausea, and generalized body pain. A&Ox4 and ambulatory. Pt asking for antibiotics.

## 2016-06-08 ENCOUNTER — Emergency Department
Admission: EM | Admit: 2016-06-08 | Discharge: 2016-06-08 | Disposition: A | Discharge status: Home / Self Care | Payer: BLUE CROSS/BLUE SHIELD | Attending: Emergency Medicine | Admitting: Emergency Medicine

## 2016-06-08 DIAGNOSIS — W57XXXA Bitten or stung by nonvenomous insect and other nonvenomous arthropods, initial encounter: Secondary | ICD-10-CM | POA: Insufficient documentation

## 2016-06-08 DIAGNOSIS — I10 Essential (primary) hypertension: Secondary | ICD-10-CM | POA: Insufficient documentation

## 2016-06-08 DIAGNOSIS — S80869A Insect bite (nonvenomous), unspecified lower leg, initial encounter: Secondary | ICD-10-CM | POA: Insufficient documentation

## 2016-06-08 DIAGNOSIS — Z7984 Long term (current) use of oral hypoglycemic drugs: Secondary | ICD-10-CM | POA: Insufficient documentation

## 2016-06-08 DIAGNOSIS — Y939 Activity, unspecified: Secondary | ICD-10-CM | POA: Insufficient documentation

## 2016-06-08 DIAGNOSIS — Z79899 Other long term (current) drug therapy: Secondary | ICD-10-CM | POA: Insufficient documentation

## 2016-06-08 DIAGNOSIS — F17210 Nicotine dependence, cigarettes, uncomplicated: Secondary | ICD-10-CM | POA: Insufficient documentation

## 2016-06-08 DIAGNOSIS — Y929 Unspecified place or not applicable: Secondary | ICD-10-CM | POA: Insufficient documentation

## 2016-06-08 DIAGNOSIS — Y999 Unspecified external cause status: Secondary | ICD-10-CM | POA: Insufficient documentation

## 2016-06-08 DIAGNOSIS — E119 Type 2 diabetes mellitus without complications: Secondary | ICD-10-CM | POA: Insufficient documentation

## 2016-06-08 DIAGNOSIS — K529 Noninfective gastroenteritis and colitis, unspecified: Secondary | ICD-10-CM | POA: Insufficient documentation

## 2016-06-08 LAB — GLUCOSE, CAPILLARY: GLUCOSE-CAPILLARY: 188 mg/dL — AB (ref 65–99)

## 2016-06-08 MED ORDER — DOXYCYCLINE HYCLATE 100 MG PO TABS: 100.0000 mg | ORAL_TABLET | Freq: Once | ORAL | Status: DC

## 2016-06-08 MED ORDER — ONDANSETRON HCL 4 MG PO TABS: 4.0000 mg | ORAL_TABLET | Freq: Every day | ORAL | Status: DC | PRN

## 2016-06-08 MED ORDER — DOXYCYCLINE HYCLATE 100 MG PO TABS
200.0000 mg | ORAL_TABLET | Freq: Once | ORAL | Status: AC
  Administered 2016-06-08: 200 mg via ORAL
  Filled 2016-06-08: qty 2

## 2016-06-08 NOTE — ED Notes (Signed)
Pt reports here yesterday with tick removal on Sunday and subsequent diarrhea & HA; had blood drawn but left prior to being seen; st this morning HA persists

## 2016-06-08 NOTE — ED Provider Notes (Signed)
[facility] Emergency Department Provider Note  ____________________________________________    I have reviewed the triage vital signs and the nursing notes.   HISTORY  Chief Complaint Insect Bite    HPI Justin Landry is a 55 y.o. male who presents with complaints of nausea vomiting and diarrhea as well as a mild headache. He reports this developed after he found a tick on his leg yesterday. He feels the tick was on him for less than 24 hours. He denies fevers or chills. No abdominal pain. No sick contacts noted. Watery diarrhea and occasional episodes of nonbilious nonbloody vomiting which he reports has improved today. He says he feels significantly better. He did come to the ED yesterday but left because of wait time     Past Medical History  Diagnosis Date  . Hypertension   . Humerus fracture     right  . Diabetes mellitus   . Inguinal hernia   . High cholesterol     Patient Active Problem List   Diagnosis Date Noted  . Essential hypertension 05/27/2015  . Type 2 diabetes mellitus without complication ([place]) 69/67/8938  . Inguinal hernia, bilateral  04/10/2014    Past Surgical History  Procedure Laterality Date  . Hernia repair    . Cystoscopy  2015    Biopsy    Current Outpatient Rx  Name  Route  Sig  Dispense  Refill  . acetaminophen (TYLENOL) 500 MG tablet   Oral   Take 1 tablet (500 mg total) by mouth every 6 (six) hours as needed. Patient taking differently: Take 1,500-2,000 mg by mouth every 6 (six) hours as needed for mild pain, moderate pain, fever or headache.    30 tablet   0   . benzonatate (TESSALON) 100 MG capsule   Oral   Take 1 capsule (100 mg total) by mouth 3 (three) times daily as needed for cough. Patient not taking: Reported on 04/26/2016   21 capsule   0   . Blood Glucose Monitoring Suppl (ONE TOUCH ULTRA SYSTEM KIT) W/DEVICE KIT   Does not apply   1 kit by Does not apply route once.   1 each   0      E11.9   . cetirizine (ZYRTEC ALLERGY) 10 MG tablet   Oral   Take 1 tablet (10 mg total) by mouth daily. Patient taking differently: Take 10 mg by mouth daily as needed for allergies.    30 tablet   0   . cholecalciferol (VITAMIN D) 1000 UNITS tablet   Oral   Take 1,000 Units by mouth every other day.          . Cyanocobalamin (VITAMIN B 12 PO)   Oral   Take 1 tablet by mouth daily.         . Fish Oil-Cholecalciferol (FISH OIL + D3) 1000-1000 MG-UNIT CAPS   Oral   Take 1 capsule by mouth daily with breakfast.          . gabapentin (NEURONTIN) 300 MG capsule      TAKE 1 CAPSULE BY MOUTH 3 TIMES A DAY   90 capsule   2   . glucose blood (ONE TOUCH TEST STRIPS) test strip      Use as instructed   100 each   5     E11.9   . hydrocodone-ibuprofen (VICOPROFEN) 5-200 MG tablet   Oral   Take 2 tablets by mouth every 8 (eight) hours as needed for pain. Reported  on 04/26/2016         . ibuprofen (ADVIL,MOTRIN) 200 MG tablet   Oral   Take 1,000 mg by mouth every 6 (six) hours as needed for fever, headache, mild pain, moderate pain or cramping.         Marland Kitchen L-Arginine 500 MG CAPS   Oral   Take 2 capsules by mouth daily.          . Lancets (ONETOUCH ULTRASOFT) lancets   Other   1 each by Other route 2 (two) times daily. Use as instructed   100 each   5     E11.9   . lisinopril (PRINIVIL,ZESTRIL) 40 MG tablet   Oral   Take 40 mg by mouth daily.         Marland Kitchen lisinopril-hydrochlorothiazide (PRINZIDE,ZESTORETIC) 20-25 MG tablet   Oral   Take 1 tablet by mouth daily.   30 tablet   0     Needs to call for appt.   . magnesium oxide (MAG-OX) 400 MG tablet   Oral   Take 800 mg by mouth at bedtime.          . metFORMIN (GLUCOPHAGE) 1000 MG tablet   Oral   Take 1 tablet (1,000 mg total) by mouth 2 (two) times daily with a meal. MUST SCHEDULE FOLLOW UP OFFICE VISIT Patient taking differently: Take 1,000 mg by mouth daily. MUST SCHEDULE FOLLOW UP OFFICE  VISIT   60 tablet   1   . ondansetron (ZOFRAN) 4 MG tablet   Oral   Take 1 tablet (4 mg total) by mouth daily as needed for nausea or vomiting.   20 tablet   1   . oxyCODONE-acetaminophen (PERCOCET/ROXICET) 5-325 MG tablet   Oral   Take 1 tablet by mouth every 6 (six) hours as needed for severe pain.   12 tablet   0   . pregabalin (LYRICA) 50 MG capsule   Oral   Take 100 mg by mouth daily as needed (pain).          . simvastatin (ZOCOR) 20 MG tablet   Oral   Take 1 tablet (20 mg total) by mouth at bedtime.   30 tablet   2   . Taurine 500 MG CAPS   Oral   Take 1 capsule by mouth daily.         . vitamin C (ASCORBIC ACID) 500 MG tablet   Oral   Take 500 mg by mouth daily.           Allergies Morphine and related; Oxycontin; and Penicillins  Family History  Problem Relation Age of Onset  . Diabetes Mother   . Hypertension Mother   . Diabetes Father   . Hypertension Father   . Cancer Brother     brain    Social History Social History  Substance Use Topics  . Smoking status: Current Every Day Smoker -- 1.00 packs/day    Types: Cigarettes  . Smokeless tobacco: Never Used  . Alcohol Use: 0.0 oz/week    0 Standard drinks or equivalent per week     Comment: occasional    Review of Systems  Constitutional: Negative for fever. Eyes: Negative for Changes in vision ENT: Negative for sore throat Cardiovascular: Negative for chest pain Respiratory: Negative for Cough Gastrointestinal: Negative for abdominal pain, Nausea and vomiting as above Genitourinary: Negative for dysuria. Musculoskeletal: No joint pain Skin: No rash Neurological: Negative for focal weakness, positive for mild headache Psychiatric: no anxiety  ____________________________________________   PHYSICAL EXAM:  VITAL SIGNS: ED Triage Vitals  Enc Vitals Group     BP 06/08/16 0645 209/115 mmHg     Pulse Rate 06/08/16 0645 84     Resp 06/08/16 0645 18     Temp 06/08/16 0645  98 F (36.7 C)     Temp Source 06/08/16 0645 Oral     SpO2 06/08/16 0645 97 %     Weight 06/08/16 0645 215 lb (97.523 kg)     Height 06/08/16 0645 5' 7" (1.702 m)     Head Cir --      Peak Flow --      Pain Score 06/08/16 0645 8     Pain Loc --      Pain Edu? --      Excl. in [place]? --     Constitutional: Alert and oriented. Well appearing and in no distress.  Eyes: Conjunctivae are normal. No erythema or injection ENT   Head: Normocephalic and atraumatic.   Mouth/Throat: Mucous membranes are moist. Cardiovascular: Normal rate, regular rhythm.  Respiratory: Normal respiratory effort without tachypnea nor retractions.  Gastrointestinal: Soft and non-tender in all quadrants. No distention. There is no CVA tenderness. Genitourinary: deferred Musculoskeletal: Nontender with normal range of motion in all extremities. No lower extremity tenderness nor edema. Neurologic:  Normal speech and language. No gross focal neurologic deficits are appreciated. Skin:  Skin is warm, dry and intact. No rash noted at tick location Psychiatric: Mood and affect are normal. Patient exhibits appropriate insight and judgment.  ____________________________________________    LABS (pertinent positives/negatives)  Labs Reviewed  CBG MONITORING, ED    ____________________________________________   EKG  None  ____________________________________________    RADIOLOGY  None  ____________________________________________   PROCEDURES  Procedure(s) performed: none  Critical Care performed: none  ____________________________________________   INITIAL IMPRESSION / ASSESSMENT AND PLAN / ED COURSE  Pertinent labs & imaging results that were available during my care of the patient were reviewed by me and considered in my medical decision making (see chart for details).  Patient well-appearing and in no distress. He reports feeling significantly better today. He did have blood work drawn  last night which I reviewed. He did not take his blood pressure medication this morning before coming to the emergency department. We will treat with by mouth Doxy 200 mg, Zofran when necessary. Recommend supportive care and outpatient follow-up and return precautions discussed     ____________________________________________   FINAL CLINICAL IMPRESSION(S) / ED DIAGNOSES  Final diagnoses:  Gastroenteritis  Tick bite          Lavonia Drafts, MD 06/08/16 [phone]

## 2016-06-08 NOTE — Discharge Instructions (Signed)
Tick Bite Information Ticks are insects that attach themselves to the skin and draw blood for food. There are various types of ticks. Common types include wood ticks and deer ticks. Most ticks live in shrubs and grassy areas. Ticks can climb onto your body when you make contact with leaves or grass where the tick is waiting. The most common places on the body for ticks to attach themselves are the scalp, neck, armpits, waist, and groin. Most tick bites are harmless, but sometimes ticks carry germs that cause diseases. These germs can be spread to a person during the tick's feeding process. The chance of a disease spreading through a tick bite depends on:   The type of tick.  Time of year.   How long the tick is attached.   Geographic location.  HOW CAN YOU PREVENT TICK BITES? Take these steps to help prevent tick bites when you are outdoors:  Wear protective clothing. Long sleeves and long pants are best.   Wear white clothes so you can see ticks more easily.  Tuck your pant legs into your socks.   If walking on a trail, stay in the middle of the trail to avoid brushing against bushes.  Avoid walking through areas with long grass.  Put insect repellent on all exposed skin and along boot tops, pant legs, and sleeve cuffs.   Check clothing, hair, and skin repeatedly and before going inside.   Brush off any ticks that are not attached.  Take a shower or bath as soon as possible after being outdoors.  WHAT IS THE PROPER WAY TO REMOVE A TICK? Ticks should be removed as soon as possible to help prevent diseases caused by tick bites. 1. If latex gloves are available, put them on before trying to remove a tick.  2. Using fine-point tweezers, grasp the tick as close to the skin as possible. You may also use curved forceps or a tick removal tool. Grasp the tick as close to its head as possible. Avoid grasping the tick on its body. 3. Pull gently with steady upward pressure until  the tick lets go. Do not twist the tick or jerk it suddenly. This may break off the tick's head or mouth parts. 4. Do not squeeze or crush the tick's body. This could force disease-carrying fluids from the tick into your body.  5. After the tick is removed, wash the bite area and your hands with soap and water or other disinfectant such as alcohol. 6. Apply a small amount of antiseptic cream or ointment to the bite site.  7. Wash and disinfect any instruments that were used.  Do not try to remove a tick by applying a hot match, petroleum jelly, or fingernail polish to the tick. These methods do not work and may increase the chances of disease being spread from the tick bite.  WHEN SHOULD YOU SEEK MEDICAL CARE? Contact your health care provider if you are unable to remove a tick from your skin or if a part of the tick breaks off and is stuck in the skin.  After a tick bite, you need to be aware of signs and symptoms that could be related to diseases spread by ticks. Contact your health care provider if you develop any of the following in the days or weeks after the tick bite:  Unexplained fever.  Rash. A circular rash that appears days or weeks after the tick bite may indicate the possibility of Lyme disease. The rash may resemble   a target with a bull's-eye and may occur at a different part of your body than the tick bite.  Redness and swelling in the area of the tick bite.   Tender, swollen lymph glands.   Diarrhea.   Weight loss.   Cough.   Fatigue.   Muscle, joint, or bone pain.   Abdominal pain.   Headache.   Lethargy or a change in your level of consciousness.  Difficulty walking or moving your legs.   Numbness in the legs.   Paralysis.  Shortness of breath.   Confusion.   Repeated vomiting.    This information is not intended to replace advice given to you by your health care provider. Make sure you discuss any questions you have with your health  care provider.   Document Released: 11/26/2000 Document Revised: 12/20/2014 Document Reviewed: 05/09/2013 Elsevier Interactive Patient Education 2016 [employer]  

## 2016-06-23 ENCOUNTER — Emergency Department (HOSPITAL_COMMUNITY)
Admission: EM | Admit: 2016-06-23 | Discharge: 2016-06-24 | Disposition: A | Discharge status: Home / Self Care | Payer: Self-pay

## 2016-06-23 NOTE — ED Notes (Signed)
No answer when called in waiting 

## 2016-06-23 NOTE — ED Notes (Signed)
Called Pt for triage, no response   

## 2016-06-24 ENCOUNTER — Emergency Department (HOSPITAL_COMMUNITY)
Admission: EM | Admit: 2016-06-24 | Discharge: 2016-06-24 | Disposition: A | Discharge status: Home / Self Care | Payer: Self-pay | Attending: Dermatology | Admitting: Dermatology

## 2016-06-24 ENCOUNTER — Emergency Department (HOSPITAL_COMMUNITY): Payer: Self-pay

## 2016-06-24 ENCOUNTER — Encounter (HOSPITAL_COMMUNITY): Payer: Self-pay | Admitting: Emergency Medicine

## 2016-06-24 DIAGNOSIS — R42 Dizziness and giddiness: Secondary | ICD-10-CM | POA: Insufficient documentation

## 2016-06-24 DIAGNOSIS — Z5321 Procedure and treatment not carried out due to patient leaving prior to being seen by health care provider: Secondary | ICD-10-CM | POA: Insufficient documentation

## 2016-06-24 LAB — CBC
HEMATOCRIT: 47.6 % (ref 39.0–52.0)
Hemoglobin: 17.2 g/dL — ABNORMAL HIGH (ref 13.0–17.0)
MCH: 31.3 pg (ref 26.0–34.0)
MCHC: 36.1 g/dL — ABNORMAL HIGH (ref 30.0–36.0)
MCV: 86.5 fL (ref 78.0–100.0)
PLATELETS: 268 10*3/uL (ref 150–400)
RBC: 5.5 MIL/uL (ref 4.22–5.81)
RDW: 12.5 % (ref 11.5–15.5)
WBC: 9.1 10*3/uL (ref 4.0–10.5)

## 2016-06-24 LAB — BASIC METABOLIC PANEL
Anion gap: 8 (ref 5–15)
BUN: 14 mg/dL (ref 6–20)
CO2: 25 mmol/L (ref 22–32)
CREATININE: 0.76 mg/dL (ref 0.61–1.24)
Calcium: 9.6 mg/dL (ref 8.9–10.3)
Chloride: 104 mmol/L (ref 101–111)
Glucose, Bld: 226 mg/dL — ABNORMAL HIGH (ref 65–99)
POTASSIUM: 3.9 mmol/L (ref 3.5–5.1)
SODIUM: 137 mmol/L (ref 135–145)

## 2016-06-24 LAB — I-STAT TROPONIN, ED: Troponin i, poc: 0.03 ng/mL (ref 0.00–0.08)

## 2016-06-24 NOTE — ED Notes (Signed)
Pt called for room with no response.  °

## 2016-06-24 NOTE — ED Notes (Signed)
Pt reports dizziness/lightheadedness for the past 2 days. Describes as feeling "high." Pt restarted lisinopril, metformin and tylenol 4 at that time. Last dose of all 3 this am. Pt also reports intermittent tingling in his mouth as well. Denies SOB or CP. Did have some palpitations this am, but none now.

## 2016-06-24 NOTE — ED Notes (Signed)
Pt called multiple times w/o response.  Lobby and entrance checked.  It is assumed the Pt left.

## 2016-06-24 NOTE — ED Notes (Signed)
Pt called for room again with no response

## 2016-12-24 ENCOUNTER — Encounter: Payer: Self-pay | Admitting: Emergency Medicine

## 2016-12-24 ENCOUNTER — Emergency Department
Admission: EM | Admit: 2016-12-24 | Discharge: 2016-12-24 | Disposition: A | Discharge status: Home / Self Care | Payer: Self-pay | Attending: Emergency Medicine | Admitting: Emergency Medicine

## 2016-12-24 ENCOUNTER — Emergency Department: Payer: Self-pay

## 2016-12-24 DIAGNOSIS — X17XXXA Contact with hot engines, machinery and tools, initial encounter: Secondary | ICD-10-CM | POA: Insufficient documentation

## 2016-12-24 DIAGNOSIS — E119 Type 2 diabetes mellitus without complications: Secondary | ICD-10-CM | POA: Insufficient documentation

## 2016-12-24 DIAGNOSIS — Y999 Unspecified external cause status: Secondary | ICD-10-CM | POA: Insufficient documentation

## 2016-12-24 DIAGNOSIS — I10 Essential (primary) hypertension: Secondary | ICD-10-CM | POA: Insufficient documentation

## 2016-12-24 DIAGNOSIS — Z23 Encounter for immunization: Secondary | ICD-10-CM | POA: Insufficient documentation

## 2016-12-24 DIAGNOSIS — F17210 Nicotine dependence, cigarettes, uncomplicated: Secondary | ICD-10-CM | POA: Insufficient documentation

## 2016-12-24 DIAGNOSIS — Y939 Activity, unspecified: Secondary | ICD-10-CM | POA: Insufficient documentation

## 2016-12-24 DIAGNOSIS — T23029A Burn of unspecified degree of unspecified single finger (nail) except thumb, initial encounter: Secondary | ICD-10-CM

## 2016-12-24 DIAGNOSIS — Y929 Unspecified place or not applicable: Secondary | ICD-10-CM | POA: Insufficient documentation

## 2016-12-24 DIAGNOSIS — Z79899 Other long term (current) drug therapy: Secondary | ICD-10-CM | POA: Insufficient documentation

## 2016-12-24 DIAGNOSIS — T23021A Burn of unspecified degree of single right finger (nail) except thumb, initial encounter: Secondary | ICD-10-CM | POA: Insufficient documentation

## 2016-12-24 DIAGNOSIS — G5691 Unspecified mononeuropathy of right upper limb: Secondary | ICD-10-CM

## 2016-12-24 MED ORDER — SILVER SULFADIAZINE 1 % EX CREA: TOPICAL_CREAM | CUTANEOUS | 0 refills | Status: DC

## 2016-12-24 MED ORDER — CEPHALEXIN 500 MG PO CAPS: 500.0000 mg | ORAL_CAPSULE | Freq: Three times a day (TID) | ORAL | 0 refills | Status: DC

## 2016-12-24 MED ORDER — TETANUS-DIPHTH-ACELL PERTUSSIS 5-2.5-18.5 LF-MCG/0.5 IM SUSP
0.5000 mL | Freq: Once | INTRAMUSCULAR | Status: AC
  Administered 2016-12-24: 0.5 mL via INTRAMUSCULAR
  Filled 2016-12-24: qty 0.5

## 2016-12-24 MED ORDER — MELOXICAM 15 MG PO TABS: 15.0000 mg | ORAL_TABLET | Freq: Every day | ORAL | 0 refills | Status: DC

## 2016-12-24 NOTE — ED Notes (Signed)
Electronic signature pad not working at this time. Patient educated on discharge instructions and prescriptions. Patient given information on Medication management clinic. Patient verbalizes understanding. All questions answered at this time.

## 2016-12-24 NOTE — ED Triage Notes (Signed)
Pt reports injuring right index finger while working on car 4-5 weeks ago. Pain continues to wake him up in the middle of the night. Pt states pain has worsened over the past few days.

## 2016-12-24 NOTE — ED Provider Notes (Signed)
[facility] Emergency Department Provider Note  ____________________________________________  Time seen: Approximately 4:31 PM  I have reviewed the triage vital signs and the nursing notes.   HISTORY  Chief Complaint Finger Injury    HPI Justin Landry is a 56 y.o. male, NAD, presents to the emergency department for evaluation of right index finger pain. States he burned the lateral portion of his index finger on a car battery approximately 5 weeks ago. He states that the wound did bleed but he did not seek any medical care for it at that time. Over the past few nights, he has experienced a hot, burning 6/10 pain from the wound that can wake him from sleep. The pain does not radiate. He is worried that he may have "burned the bone" and is concerned for infection as he is a diabetic. States that he checks his sugars on a regular basis and has not noted any elevations in his readings.  He has not taking anything for the pain, and says that the only thing that makes the wound hurt worse is when he presses directly on it. He has no recent history of infections or antibiotic use. His last tetanus vaccine was 5 or 6 years ago. Denies any loss of function of the finger has had no pain in the hand or wrist. Denies fevers, chills or body aches. Has not noted any redness, swelling, abnormal warmth, oozing, weeping or further bleeding.    Past Medical History:  Diagnosis Date  . Diabetes mellitus   . High cholesterol   . Humerus fracture    right  . Hypertension   . Inguinal hernia     Patient Active Problem List   Diagnosis Date Noted  . Essential hypertension 05/27/2015  . Type 2 diabetes mellitus without complication ([place]) 16/09/9603  . Inguinal hernia, bilateral  04/10/2014    Past Surgical History:  Procedure Laterality Date  . CYSTOSCOPY  2015   Biopsy  . HERNIA REPAIR      Prior to Admission medications   Medication Sig Start Date End Date  Taking? Authorizing Provider  acetaminophen (TYLENOL) 500 MG tablet Take 1 tablet (500 mg total) by mouth every 6 (six) hours as needed. Patient taking differently: Take 1,500-2,000 mg by mouth every 6 (six) hours as needed for mild pain, moderate pain, fever or headache.  10/13/15   Waynetta Pean, PA-C  benzonatate (TESSALON) 100 MG capsule Take 1 capsule (100 mg total) by mouth 3 (three) times daily as needed for cough. Patient not taking: Reported on 04/26/2016 10/13/15   Waynetta Pean, PA-C  Blood Glucose Monitoring Suppl (ONE TOUCH ULTRA SYSTEM KIT) W/DEVICE KIT 1 kit by Does not apply route once. 08/28/15   Jearld Fenton, NP  cephALEXin (KEFLEX) 500 MG capsule Take 1 capsule (500 mg total) by mouth 3 (three) times daily. 12/24/16   Jami L Hagler, PA-C  cetirizine (ZYRTEC ALLERGY) 10 MG tablet Take 1 tablet (10 mg total) by mouth daily. Patient taking differently: Take 10 mg by mouth daily as needed for allergies.  10/13/15   Waynetta Pean, PA-C  cholecalciferol (VITAMIN D) 1000 UNITS tablet Take 1,000 Units by mouth every other day.     Historical Provider, MD  Cyanocobalamin (VITAMIN B 12 PO) Take 1 tablet by mouth daily.    Historical Provider, MD  Fish Oil-Cholecalciferol (FISH OIL + D3) 1000-1000 MG-UNIT CAPS Take 1 capsule by mouth daily with breakfast.     Historical Provider, MD  gabapentin (  NEURONTIN) 300 MG capsule TAKE 1 CAPSULE BY MOUTH 3 TIMES A DAY 08/08/15   Jearld Fenton, NP  glucose blood (ONE TOUCH TEST STRIPS) test strip Use as instructed 08/28/15   Jearld Fenton, NP  hydrocodone-ibuprofen (VICOPROFEN) 5-200 MG tablet Take 2 tablets by mouth every 8 (eight) hours as needed for pain. Reported on 04/26/2016    Historical Provider, MD  ibuprofen (ADVIL,MOTRIN) 200 MG tablet Take 1,000 mg by mouth every 6 (six) hours as needed for fever, headache, mild pain, moderate pain or cramping.    Historical Provider, MD  L-Arginine 500 MG CAPS Take 2 capsules by mouth daily.     Historical  Provider, MD  Lancets [facility] ULTRASOFT) lancets 1 each by Other route 2 (two) times daily. Use as instructed 08/28/15   Jearld Fenton, NP  lisinopril (PRINIVIL,ZESTRIL) 40 MG tablet Take 40 mg by mouth daily.    Historical Provider, MD  lisinopril-hydrochlorothiazide (PRINZIDE,ZESTORETIC) 20-25 MG tablet Take 1 tablet by mouth daily. 01/09/16   Jearld Fenton, NP  magnesium oxide (MAG-OX) 400 MG tablet Take 800 mg by mouth at bedtime.     Historical Provider, MD  meloxicam (MOBIC) 15 MG tablet Take 1 tablet (15 mg total) by mouth daily. 12/24/16   Federick Levene L Cohen Doleman, PA-C  metFORMIN (GLUCOPHAGE) 1000 MG tablet Take 1 tablet (1,000 mg total) by mouth 2 (two) times daily with a meal. MUST SCHEDULE FOLLOW UP OFFICE VISIT Patient taking differently: Take 1,000 mg by mouth daily. MUST SCHEDULE FOLLOW UP OFFICE VISIT 12/23/15   Jearld Fenton, NP  ondansetron [facility]) 4 MG tablet Take 1 tablet (4 mg total) by mouth daily as needed for nausea or vomiting. 06/08/16   Lavonia Drafts, MD  oxyCODONE-acetaminophen (PERCOCET/ROXICET) 5-325 MG tablet Take 1 tablet by mouth every 6 (six) hours as needed for severe pain. 04/26/16   Harvel Quale, MD  pregabalin (LYRICA) 50 MG capsule Take 100 mg by mouth daily as needed (pain).     Historical Provider, MD  silver sulfADIAZINE (SILVADENE) 1 % cream Apply to affected twice daily with bandage change. 12/24/16   Jenny Lai L Albi Rappaport, PA-C  simvastatin (ZOCOR) 20 MG tablet Take 1 tablet (20 mg total) by mouth at bedtime. 05/28/15   Jearld Fenton, NP  Taurine 500 MG CAPS Take 1 capsule by mouth daily.    Historical Provider, MD  vitamin C (ASCORBIC ACID) 500 MG tablet Take 500 mg by mouth daily.    Historical Provider, MD    Allergies Morphine and related; Oxycontin [oxycodone hcl]; and Penicillins  Family History  Problem Relation Age of Onset  . Diabetes Mother   . Hypertension Mother   . Diabetes Father   . Hypertension Father   . Cancer Brother     brain    Social  History Social History  Substance Use Topics  . Smoking status: Current Every Day Smoker    Packs/day: 1.00    Types: Cigarettes  . Smokeless tobacco: Never Used  . Alcohol use 0.0 oz/week     Comment: occasional     Review of Systems  Constitutional: No fever/chills Musculoskeletal: Positive for focal right index finger pain. Negative for hand or wrist pain.  Skin: Positive skin sore right index finger. Negative for rash or redness, swelling, open wounds, oozing, weeping, bleeding. Neurological: Negative for numbness, weakness, tingling  ____________________________________________   PHYSICAL EXAM:  VITAL SIGNS: ED Triage Vitals  Enc Vitals Group     BP 12/24/16 1606 (!)  153/103     Pulse Rate 12/24/16 1606 (!) 105     Resp 12/24/16 1606 18     Temp 12/24/16 1606 98.1 F (36.7 C)     Temp Source 12/24/16 1606 Oral     SpO2 12/24/16 1606 96 %     Weight 12/24/16 1606 210 lb (95.3 kg)     Height 12/24/16 1606 5' 7" (1.702 m)     Head Circumference --      Peak Flow --      Pain Score 12/24/16 1607 6     Pain Loc --      Pain Edu? --      Excl. in [place]? --      Constitutional: Alert and oriented. Well appearing and in no acute distress. Eyes: Conjunctivae are normal.  Head: Atraumatic. Cardiovascular: Good peripheral circulationWith 2+ pulses noted in the right upper extremity. Capillary refill is brisk in all digits of the right hand. Respiratory: Normal respiratory effort without tachypnea or retractions.  Musculoskeletal: Full range of motion of the right index finger without pain or difficulty. Strength of the right index finger finger is 5 out of 5. Neurologic:  Normal speech and language. No gross focal neurologic deficits are appreciated. Sensation to light touch grossly intact. The right upper extremity. Skin:  Scabbed over 3 mm annular area noted about the lateral, medial portion of the right index finger. No surrounding erythema, abnormal warmth or swelling.  No evidence of cellulitis or streaking. Skin is warm, dry and intact. No rash noted. Psychiatric: Mood and affect are normal. Speech and behavior are normal. Patient exhibits appropriate insight and judgement.   ____________________________________________   LABS  None ____________________________________________  EKG  None ____________________________________________  RADIOLOGY I, Braxton Feathers, personally viewed and evaluated these images (plain radiographs) as part of my medical decision making, as well as reviewing the written report by the radiologist.  Dg Finger Index Right  Result Date: 12/24/2016 CLINICAL DATA:  Worsening pain for 4-5 days. EXAM: RIGHT INDEX FINGER 2+V COMPARISON:  None. FINDINGS: No acute fracture or dislocation. Normal alignment. 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 Mild osteoarthritis of the third MCP joint. No focal soft tissue abnormality. No soft tissue emphysema or radiopaque foreign body. IMPRESSION: No acute osseous injury of the right second digit. Electronically Signed   By: Kathreen Devoid   On: 12/24/2016 17:00    ____________________________________________    PROCEDURES  Procedure(s) performed: None   Procedures   Medications  Tdap (BOOSTRIX) injection 0.5 mL (0.5 mLs Intramuscular Given 12/24/16 1705)     ____________________________________________   INITIAL IMPRESSION / ASSESSMENT AND PLAN / ED COURSE  Pertinent labs & imaging results that were available during my care of the patient were reviewed by me and considered in my medical decision making (see chart for details).  Clinical Course     Patient's diagnosis is consistent with Burn of right index finger, neuropathy of index finger and need for tetanus booster. Patient was given TDaP While in the emergency department and tolerated well without any immediate side effects. Patient will be discharged home with prescriptions for Keflex, Motrin and Silvadene cream to use  as directed. Patient is to follow up with his primary care provider or hand specialist if symptoms persist past this treatment course. Patient is given ED precautions to return to the ED for any worsening or new symptoms.    ____________________________________________  FINAL CLINICAL IMPRESSION(S) / ED DIAGNOSES  Final diagnoses:  Burn of  finger, unspecified burn degree, unspecified laterality, initial encounter  Need for tetanus booster  Neuropathy of finger of right hand      NEW MEDICATIONS STARTED DURING THIS VISIT:  Discharge Medication List as of 12/24/2016  5:11 PM    START taking these medications   Details  cephALEXin (KEFLEX) 500 MG capsule Take 1 capsule (500 mg total) by mouth 3 (three) times daily., Starting Fri 12/24/2016, Print    meloxicam (MOBIC) 15 MG tablet Take 1 tablet (15 mg total) by mouth daily., Starting Fri 12/24/2016, Print    silver sulfADIAZINE (SILVADENE) 1 % cream Apply to affected twice daily with bandage change., Print             Braxton Feathers, PA-C 12/24/16 [place], MD 12/24/16 2035

## 2016-12-27 ENCOUNTER — Encounter: Payer: Self-pay | Admitting: Emergency Medicine

## 2016-12-27 ENCOUNTER — Emergency Department
Admission: EM | Admit: 2016-12-27 | Discharge: 2016-12-27 | Disposition: A | Discharge status: Home / Self Care | Payer: Self-pay | Attending: Emergency Medicine | Admitting: Emergency Medicine

## 2016-12-27 ENCOUNTER — Emergency Department: Payer: Self-pay

## 2016-12-27 DIAGNOSIS — Z7984 Long term (current) use of oral hypoglycemic drugs: Secondary | ICD-10-CM | POA: Insufficient documentation

## 2016-12-27 DIAGNOSIS — F17210 Nicotine dependence, cigarettes, uncomplicated: Secondary | ICD-10-CM | POA: Insufficient documentation

## 2016-12-27 DIAGNOSIS — I10 Essential (primary) hypertension: Secondary | ICD-10-CM | POA: Insufficient documentation

## 2016-12-27 DIAGNOSIS — E119 Type 2 diabetes mellitus without complications: Secondary | ICD-10-CM | POA: Insufficient documentation

## 2016-12-27 DIAGNOSIS — M7541 Impingement syndrome of right shoulder: Secondary | ICD-10-CM | POA: Insufficient documentation

## 2016-12-27 MED ORDER — MELOXICAM 15 MG PO TABS: 15.0000 mg | ORAL_TABLET | Freq: Every day | ORAL | 0 refills | Status: DC

## 2016-12-27 MED ORDER — KETOROLAC TROMETHAMINE 30 MG/ML IJ SOLN
30.0000 mg | Freq: Once | INTRAMUSCULAR | Status: AC
Start: 2016-12-27 — End: 2016-12-27
  Administered 2016-12-27: 30 mg via INTRAMUSCULAR
  Filled 2016-12-27: qty 1

## 2016-12-27 NOTE — ED Triage Notes (Signed)
States he was digging a hole   Hit a rock  States right arm is having increased pain and had some numbness

## 2016-12-27 NOTE — ED Provider Notes (Signed)
[facility] Emergency Department Provider Note  ____________________________________________  Time seen: Approximately 4:26 PM  I have reviewed the triage vital signs and the nursing notes.   HISTORY  Chief Complaint Arm Pain    HPI Justin Landry is a 56 y.o. male who presents to the emergency department complaining of right anterior shoulder pain. Patient states that he was bearing some cable and using a pick ax to break up some hard ground. Patient states that he either hit a rock or a piece of concrete causing a sudden jarring motion. Patient states that he had instant pain to the right anterior shoulder and has had intermittent numbness/tingling/burning sensation running from the shoulder down to his fingers. Patient has limited range of motion due to pain. He denies any other pain. He denies any other injury. No Medications prior to arrival.   Past Medical History:  Diagnosis Date  . Diabetes mellitus   . High cholesterol   . Humerus fracture    right  . Hypertension   . Inguinal hernia     Patient Active Problem List   Diagnosis Date Noted  . Essential hypertension 05/27/2015  . Type 2 diabetes mellitus without complication ([place]) 56/21/3086  . Inguinal hernia, bilateral  04/10/2014    Past Surgical History:  Procedure Laterality Date  . CYSTOSCOPY  2015   Biopsy  . HERNIA REPAIR      Prior to Admission medications   Medication Sig Start Date End Date Taking? Authorizing Provider  acetaminophen (TYLENOL) 500 MG tablet Take 1 tablet (500 mg total) by mouth every 6 (six) hours as needed. Patient taking differently: Take 1,500-2,000 mg by mouth every 6 (six) hours as needed for mild pain, moderate pain, fever or headache.  10/13/15   Waynetta Pean, PA-C  Blood Glucose Monitoring Suppl (ONE TOUCH ULTRA SYSTEM KIT) W/DEVICE KIT 1 kit by Does not apply route once. 08/28/15   Jearld Fenton, NP  cephALEXin (KEFLEX) 500 MG capsule Take 1  capsule (500 mg total) by mouth 3 (three) times daily. 12/24/16   Jami L Hagler, PA-C  cetirizine (ZYRTEC ALLERGY) 10 MG tablet Take 1 tablet (10 mg total) by mouth daily. Patient taking differently: Take 10 mg by mouth daily as needed for allergies.  10/13/15   Waynetta Pean, PA-C  cholecalciferol (VITAMIN D) 1000 UNITS tablet Take 1,000 Units by mouth every other day.     Historical Provider, MD  Cyanocobalamin (VITAMIN B 12 PO) Take 1 tablet by mouth daily.    Historical Provider, MD  Fish Oil-Cholecalciferol (FISH OIL + D3) 1000-1000 MG-UNIT CAPS Take 1 capsule by mouth daily with breakfast.     Historical Provider, MD  gabapentin (NEURONTIN) 300 MG capsule TAKE 1 CAPSULE BY MOUTH 3 TIMES A DAY 08/08/15   Jearld Fenton, NP  glucose blood (ONE TOUCH TEST STRIPS) test strip Use as instructed 08/28/15   Jearld Fenton, NP  hydrocodone-ibuprofen (VICOPROFEN) 5-200 MG tablet Take 2 tablets by mouth every 8 (eight) hours as needed for pain. Reported on 04/26/2016    Historical Provider, MD  ibuprofen (ADVIL,MOTRIN) 200 MG tablet Take 1,000 mg by mouth every 6 (six) hours as needed for fever, headache, mild pain, moderate pain or cramping.    Historical Provider, MD  L-Arginine 500 MG CAPS Take 2 capsules by mouth daily.     Historical Provider, MD  Lancets [facility] ULTRASOFT) lancets 1 each by Other route 2 (two) times daily. Use as instructed 08/28/15  Jearld Fenton, NP  lisinopril (PRINIVIL,ZESTRIL) 40 MG tablet Take 40 mg by mouth daily.    Historical Provider, MD  lisinopril-hydrochlorothiazide (PRINZIDE,ZESTORETIC) 20-25 MG tablet Take 1 tablet by mouth daily. 01/09/16   Jearld Fenton, NP  magnesium oxide (MAG-OX) 400 MG tablet Take 800 mg by mouth at bedtime.     Historical Provider, MD  meloxicam (MOBIC) 15 MG tablet Take 1 tablet (15 mg total) by mouth daily. 12/27/16   Charline Bills Cuthriell, PA-C  metFORMIN (GLUCOPHAGE) 1000 MG tablet Take 1 tablet (1,000 mg total) by mouth 2 (two) times daily  with a meal. MUST SCHEDULE FOLLOW UP OFFICE VISIT Patient taking differently: Take 1,000 mg by mouth daily. MUST SCHEDULE FOLLOW UP OFFICE VISIT 12/23/15   Jearld Fenton, NP  ondansetron [facility]) 4 MG tablet Take 1 tablet (4 mg total) by mouth daily as needed for nausea or vomiting. 06/08/16   Lavonia Drafts, MD  pregabalin (LYRICA) 50 MG capsule Take 100 mg by mouth daily as needed (pain).     Historical Provider, MD  simvastatin (ZOCOR) 20 MG tablet Take 1 tablet (20 mg total) by mouth at bedtime. 05/28/15   Jearld Fenton, NP  Taurine 500 MG CAPS Take 1 capsule by mouth daily.    Historical Provider, MD  vitamin C (ASCORBIC ACID) 500 MG tablet Take 500 mg by mouth daily.    Historical Provider, MD    Allergies Morphine and related; Oxycontin [oxycodone hcl]; and Penicillins  Family History  Problem Relation Age of Onset  . Diabetes Mother   . Hypertension Mother   . Diabetes Father   . Hypertension Father   . Cancer Brother     brain    Social History Social History  Substance Use Topics  . Smoking status: Current Every Day Smoker    Packs/day: 1.00    Types: Cigarettes  . Smokeless tobacco: Never Used  . Alcohol use 0.0 oz/week     Comment: occasional     Review of Systems  Constitutional: No fever/chills Cardiovascular: no chest pain. Respiratory: no cough. No SOB. Musculoskeletal: Positive for right anterior shoulder pain Skin: Negative for rash, abrasions, lacerations, ecchymosis. Neurological: Negative for headaches, focal weakness or numbness. 10-point ROS otherwise negative.  ____________________________________________   PHYSICAL EXAM:  VITAL SIGNS: ED Triage Vitals  Enc Vitals Group     BP --      Pulse --      Resp --      Temp --      Temp src --      SpO2 --      Weight 12/27/16 1602 210 lb (95.3 kg)     Height 12/27/16 1602 5' 7" (1.702 m)     Head Circumference --      Peak Flow --      Pain Score 12/27/16 1607 9     Pain Loc --      Pain  Edu? --      Excl. in [place]? --      Constitutional: Alert and oriented. Well appearing and in no acute distress. Eyes: Conjunctivae are normal. PERRL. EOMI. Head: Atraumatic. Neck: No stridor.  No cervical spine tenderness to palpation.  Cardiovascular: Normal rate, regular rhythm. Normal S1 and S2.  Good peripheral circulation. Respiratory: Normal respiratory effort without tachypnea or retractions. Lungs CTAB. Good air entry to the bases with no decreased or absent breath sounds. Musculoskeletal: Significantly limited range of motion in all planes to the right  arm. No visible deformity or gross edema to the right shoulder. Patient is very tender to palpation over the [facility] joint. No palpable abnormality. Palpation of the AC increases both pain and numbness and tingling. No tenderness to palpation over the rotator cuff distribution. Pulse Refill intact distally. Sensation intact and equal to unaffected extremity. Neurologic:  Normal speech and language. No gross focal neurologic deficits are appreciated.  Skin:  Skin is warm, dry and intact. No rash noted. Psychiatric: Mood and affect are normal. Speech and behavior are normal. Patient exhibits appropriate insight and judgement.   ____________________________________________   LABS (all labs ordered are listed, but only abnormal results are displayed)  Labs Reviewed - No data to display ____________________________________________  EKG   ____________________________________________  RADIOLOGY Diamantina [place] Cuthriell, personally viewed and evaluated these images (plain radiographs) as part of my medical decision making, as well as reviewing the written report by the radiologist.  Dg Shoulder Right  Result Date: 12/27/2016 CLINICAL DATA:  RIGHT arm pain after digging a hole and striking a rock. EXAM: RIGHT SHOULDER - 2+ VIEW COMPARISON:  None. FINDINGS: The humeral head is well-formed and located. Mild high-riding humeral head may be  projectional or, associated with old rotator cuff injuries. The subacromial, glenohumeral and acromioclavicular joint spaces are intact with mild are osteoarthrosis. Acromioclavicular undersurface spurring associated with impingement. No destructive bony lesions. Soft tissue planes are non-suspicious. IMPRESSION: No acute fracture deformity or dislocation. Mild degenerative change. Electronically Signed   By: Elon Alas M.D.   On: 12/27/2016 16:59    ____________________________________________    PROCEDURES  Procedure(s) performed:    Procedures    Medications  ketorolac (TORADOL) 30 MG/ML injection 30 mg (not administered)     ____________________________________________   INITIAL IMPRESSION / ASSESSMENT AND PLAN / ED COURSE  Pertinent labs & imaging results that were available during my care of the patient were reviewed by me and considered in my medical decision making (see chart for details).  Review of the [place] CSRS was performed in accordance of the [place] prior to dispensing any controlled drugs.  Clinical Course     Patient's diagnosis is consistent with Right shoulder impingement syndrome. Patient presents emergency Department with sudden anterior shoulder pain. Patient was tender to palpation over the [facility] joint. X-ray reveals no acute osseous abnormality or separation of the joint. Impingement syndrome versus small rotator cuff tear. Likely impingement syndrome at this time. Patient will be trialed on anti-inflammatories. If no symptom improvement after 2 weeks he will follow-up with orthopedics for further evaluation and management..  Patient is given ED precautions to return to the ED for any worsening or new symptoms.     ____________________________________________  FINAL CLINICAL IMPRESSION(S) / ED DIAGNOSES  Final diagnoses:  Impingement syndrome of right shoulder      NEW MEDICATIONS STARTED DURING THIS VISIT:  New Prescriptions   MELOXICAM  (MOBIC) 15 MG TABLET    Take 1 tablet (15 mg total) by mouth daily.        This chart was dictated using voice recognition software/Dragon. Despite best efforts to proofread, errors can occur which can change the meaning. Any change was purely unintentional.    Darletta Moll, PA-C 12/27/16 [place], MD 12/28/16 [phone]

## 2017-02-28 ENCOUNTER — Encounter (HOSPITAL_COMMUNITY): Payer: Self-pay

## 2017-02-28 ENCOUNTER — Emergency Department (HOSPITAL_COMMUNITY)
Admission: EM | Admit: 2017-02-28 | Discharge: 2017-02-28 | Disposition: A | Discharge status: Home / Self Care | Payer: Self-pay | Attending: Dermatology | Admitting: Dermatology

## 2017-02-28 DIAGNOSIS — Z5321 Procedure and treatment not carried out due to patient leaving prior to being seen by health care provider: Secondary | ICD-10-CM | POA: Insufficient documentation

## 2017-02-28 DIAGNOSIS — R109 Unspecified abdominal pain: Secondary | ICD-10-CM | POA: Insufficient documentation

## 2017-02-28 NOTE — ED Triage Notes (Signed)
Pt had hernia repair in 2009.  Pt has been told that mesh has torn.  Pt was sent to pain clinic and told that they could not operate on it.  Pt states pain continues.  No emesis.  Last BM today.

## 2017-02-28 NOTE — ED Notes (Signed)
Called pt for reasses vitals no response. x3

## 2017-02-28 NOTE — ED Notes (Signed)
Called pt for reasses vitals no response. 

## 2017-02-28 NOTE — ED Notes (Signed)
Pt not found in lobby 

## 2019-01-04 ENCOUNTER — Emergency Department: Payer: Self-pay

## 2019-01-04 ENCOUNTER — Emergency Department
Admission: EM | Admit: 2019-01-04 | Discharge: 2019-01-04 | Disposition: A | Discharge status: Home / Self Care | Payer: Self-pay | Attending: Emergency Medicine | Admitting: Emergency Medicine

## 2019-01-04 ENCOUNTER — Encounter: Payer: Self-pay | Admitting: Emergency Medicine

## 2019-01-04 DIAGNOSIS — M25511 Pain in right shoulder: Secondary | ICD-10-CM | POA: Insufficient documentation

## 2019-01-04 DIAGNOSIS — I10 Essential (primary) hypertension: Secondary | ICD-10-CM | POA: Insufficient documentation

## 2019-01-04 DIAGNOSIS — E119 Type 2 diabetes mellitus without complications: Secondary | ICD-10-CM | POA: Insufficient documentation

## 2019-01-04 DIAGNOSIS — F17210 Nicotine dependence, cigarettes, uncomplicated: Secondary | ICD-10-CM | POA: Insufficient documentation

## 2019-01-04 DIAGNOSIS — Z7901 Long term (current) use of anticoagulants: Secondary | ICD-10-CM | POA: Insufficient documentation

## 2019-01-04 DIAGNOSIS — Z79899 Other long term (current) drug therapy: Secondary | ICD-10-CM | POA: Insufficient documentation

## 2019-01-04 MED ORDER — OXYCODONE-ACETAMINOPHEN 7.5-325 MG PO TABS: 1.0000 | ORAL_TABLET | Freq: Four times a day (QID) | ORAL | 0 refills | Status: DC | PRN

## 2019-01-04 MED ORDER — HYDROCODONE-IBUPROFEN 7.5-200 MG PO TABS: 1.0000 | ORAL_TABLET | Freq: Four times a day (QID) | ORAL | 0 refills | Status: DC | PRN

## 2019-01-04 NOTE — ED Triage Notes (Signed)
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

## 2019-01-04 NOTE — Discharge Instructions (Signed)
Wear arm sling for 3 to 5 days as needed.

## 2019-01-04 NOTE — ED Notes (Signed)
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

## 2019-01-04 NOTE — ED Provider Notes (Signed)
[facility] Emergency Department Provider Note   ____________________________________________   First MD Initiated Contact with Patient 01/04/19 1552     (approximate)  I have reviewed the triage vital signs and the nursing notes.   HISTORY  Chief Complaint Shoulder Pain    HPI Justin Landry is a 58 y.o. male patient presents with worsening of his chronic shoulder pain which is secondary to rotator cuff injury.  Patient state he was painting today when he felt a pop resulting in increased pain.  Patient states decreased range of motion limited by complaint of pain.  Patient expresses concern for subluxation of the shoulder.  Patient rates his pain a 6/10.  Patient described the pain as "sharp".  Patient denies loss of sensation.  No palliative measure for complaint.  Patient was questioned secondary to his hypertension and stated he did not take his medication today.  Past Medical History:  Diagnosis Date  . Diabetes mellitus   . High cholesterol   . Humerus fracture    right  . Hypertension   . Inguinal hernia     Patient Active Problem List   Diagnosis Date Noted  . Essential hypertension 05/27/2015  . Type 2 diabetes mellitus without complication ([place]) 75/91/6384  . Inguinal hernia, bilateral  04/10/2014    Past Surgical History:  Procedure Laterality Date  . CYSTOSCOPY  2015   Biopsy  . HERNIA REPAIR      Prior to Admission medications   Medication Sig Start Date End Date Taking? Authorizing Provider  acetaminophen (TYLENOL) 500 MG tablet Take 1 tablet (500 mg total) by mouth every 6 (six) hours as needed. Patient taking differently: Take 1,500-2,000 mg by mouth every 6 (six) hours as needed for mild pain, moderate pain, fever or headache.  10/13/15   Waynetta Pean, PA-C  Blood Glucose Monitoring Suppl (ONE TOUCH ULTRA SYSTEM KIT) W/DEVICE KIT 1 kit by Does not apply route once. 08/28/15   Jearld Fenton, NP  cephALEXin  (KEFLEX) 500 MG capsule Take 1 capsule (500 mg total) by mouth 3 (three) times daily. 12/24/16   Hagler, Jami L, PA-C  cetirizine (ZYRTEC ALLERGY) 10 MG tablet Take 1 tablet (10 mg total) by mouth daily. Patient taking differently: Take 10 mg by mouth daily as needed for allergies.  10/13/15   Waynetta Pean, PA-C  cholecalciferol (VITAMIN D) 1000 UNITS tablet Take 1,000 Units by mouth every other day.     [provider]  Cyanocobalamin (VITAMIN B 12 PO) Take 1 tablet by mouth daily.    [provider]  Fish Oil-Cholecalciferol (FISH OIL + D3) 1000-1000 MG-UNIT CAPS Take 1 capsule by mouth daily with breakfast.     [provider]  gabapentin (NEURONTIN) 300 MG capsule TAKE 1 CAPSULE BY MOUTH 3 TIMES A DAY 08/08/15   Baity, Regina W, NP  glucose blood (ONE TOUCH TEST STRIPS) test strip Use as instructed 08/28/15   Jearld Fenton, NP  hydrocodone-ibuprofen (VICOPROFEN) 5-200 MG tablet Take 2 tablets by mouth every 8 (eight) hours as needed for pain. Reported on 04/26/2016    [provider]  HYDROcodone-ibuprofen (VICOPROFEN) 7.5-200 MG tablet Take 1 tablet by mouth every 6 (six) hours as needed for moderate pain. 01/04/19 01/04/20  Sable Feil, PA-C  ibuprofen (ADVIL,MOTRIN) 200 MG tablet Take 1,000 mg by mouth every 6 (six) hours as needed for fever, headache, mild pain, moderate pain or cramping.    [provider]  L-Arginine 500 MG  CAPS Take 2 capsules by mouth daily.     [provider]  Lancets [facility] ULTRASOFT) lancets 1 each by Other route 2 (two) times daily. Use as instructed 08/28/15   Jearld Fenton, NP  lisinopril (PRINIVIL,ZESTRIL) 40 MG tablet Take 40 mg by mouth daily.    [provider]  lisinopril-hydrochlorothiazide (PRINZIDE,ZESTORETIC) 20-25 MG tablet Take 1 tablet by mouth daily. 01/09/16   Jearld Fenton, NP  magnesium oxide (MAG-OX) 400 MG tablet Take 800 mg by mouth at bedtime.     [provider]    meloxicam (MOBIC) 15 MG tablet Take 1 tablet (15 mg total) by mouth daily. 12/27/16   Cuthriell, Charline Bills, PA-C  metFORMIN (GLUCOPHAGE) 1000 MG tablet Take 1 tablet (1,000 mg total) by mouth 2 (two) times daily with a meal. MUST SCHEDULE FOLLOW UP OFFICE VISIT Patient taking differently: Take 1,000 mg by mouth daily. MUST SCHEDULE FOLLOW UP OFFICE VISIT 12/23/15   Jearld Fenton, NP  ondansetron (ZOFRAN) 4 MG tablet Take 1 tablet (4 mg total) by mouth daily as needed for nausea or vomiting. 06/08/16   Lavonia Drafts, MD  pregabalin (LYRICA) 50 MG capsule Take 100 mg by mouth daily as needed (pain).     [provider]  simvastatin (ZOCOR) 20 MG tablet Take 1 tablet (20 mg total) by mouth at bedtime. 05/28/15   Jearld Fenton, NP  Taurine 500 MG CAPS Take 1 capsule by mouth daily.    [provider]  vitamin C (ASCORBIC ACID) 500 MG tablet Take 500 mg by mouth daily.    [provider]    Allergies Morphine and related; Oxycontin [oxycodone hcl]; and Penicillins  Family History  Problem Relation Age of Onset  . Diabetes Mother   . Hypertension Mother   . Diabetes Father   . Hypertension Father   . Cancer Brother        brain    Social History Social History   Tobacco Use  . Smoking status: Current Every Day Smoker    Packs/day: 1.00    Types: Cigarettes  . Smokeless tobacco: Never Used  Substance Use Topics  . Alcohol use: Yes    Alcohol/week: 0.0 standard drinks    Comment: occasional  . Drug use: No    Review of Systems  Constitutional: No fever/chills Eyes: No visual changes. ENT: No sore throat. Cardiovascular: Denies chest pain. Respiratory: Denies shortness of breath. Gastrointestinal: No abdominal pain.  No nausea, no vomiting.  No diarrhea.  No constipation. Genitourinary: Negative for dysuria. Musculoskeletal: Negative for back pain. Skin: Negative for rash. Neurological: Negative for headaches, focal weakness or  numbness. Endocrine:Diabetes, hyperlipidemia, and hypertension. Allergic/Immunilogical: Morphine and penicillin.  ____________________________________________   PHYSICAL EXAM:  VITAL SIGNS: ED Triage Vitals  Enc Vitals Group     BP 01/04/19 1511 (!) 176/112     Pulse Rate 01/04/19 1511 93     Resp 01/04/19 1511 18     Temp 01/04/19 1511 97.9 F (36.6 C)     Temp Source 01/04/19 1511 Oral     SpO2 01/04/19 1511 97 %     Weight 01/04/19 1508 214 lb (97.1 kg)     Height 01/04/19 1508 _0  (1.702 m)     Head Circumference --      Peak Flow --      Pain Score 01/04/19 1508 6     Pain Loc --      Pain Edu? --  Excl. in [place]? --    Constitutional: Alert and oriented.  Moderate distress.   Cardiovascular: Normal rate, regular rhythm. Grossly normal heart sounds.  Good peripheral circulation.  Elevated blood pressure. Respiratory: Normal respiratory effort.  No retractions. Lungs CTAB. Musculoskeletal: No obvious deformity or visible subluxation of the shoulder.  Patient has decreased range of motion abduction overhead reaching limited by complaint of pain.  Neurologic:  Normal speech and language. No gross focal neurologic deficits are appreciated. No gait instability. Skin:  Skin is warm, dry and intact. No rash noted. Psychiatric: Mood and affect are normal. Speech and behavior are normal.  ____________________________________________   LABS (all labs ordered are listed, but only abnormal results are displayed)  Labs Reviewed - No data to display ____________________________________________  EKG   ____________________________________________  RADIOLOGY  ED MD interpretation:    Official radiology report(s): Dg Shoulder Right  Result Date: 01/04/2019 CLINICAL DATA:  Chronic RIGHT shoulder pain and rotator cuff injury. EXAM: RIGHT SHOULDER - 2+ VIEW COMPARISON:  None. FINDINGS: No acute fracture or dislocation identified. Degenerative changes at the [facility] joint and  glenohumeral joint again noted. Loss of the acromial humeral distance appears increased and compatible with rotator cuff tear. No acute bony abnormalities are present. IMPRESSION: 1. No evidence of acute bony abnormality. AC and glenohumeral joint degenerative changes. 2. Loss of the acromial humeral distance which appears increased and may represent worsening rotator cuff tear/injury. Electronically Signed   By: Margarette Canada M.D.   On: 01/04/2019 15:27    ____________________________________________   PROCEDURES  Procedure(s) performed: None  Procedures  Critical Care performed: No  ____________________________________________   INITIAL IMPRESSION / ASSESSMENT AND PLAN / ED COURSE  As part of my medical decision making, I reviewed the following data within the electronic MEDICAL RECORD NUMBER    Right shoulder pain secondary to chronic degenerative changes.  Discussed x-ray findings with patient.  Patient placed in arm sling.  Patient given discharge care instruction.  Patient did not want referral to orthopedics and they told him in the past there was no surgical intervention.      ____________________________________________   FINAL CLINICAL IMPRESSION(S) / ED DIAGNOSES  Final diagnoses:  Acute pain of right shoulder     ED Discharge Orders         Ordered    HYDROcodone-ibuprofen (VICOPROFEN) 7.5-200 MG tablet  Every 6 hours PRN     01/04/19 1602           Note:  This document was prepared using Dragon voice recognition software and may include unintentional dictation errors.    Sable Feil, PA-C 01/04/19 1609    Nance Pear, MD 01/04/19 [phone]

## 2019-01-21 ENCOUNTER — Emergency Department
Admission: EM | Admit: 2019-01-21 | Discharge: 2019-01-21 | Disposition: A | Discharge status: Home / Self Care | Payer: Self-pay | Attending: Emergency Medicine | Admitting: Emergency Medicine

## 2019-01-21 ENCOUNTER — Encounter: Payer: Self-pay | Admitting: Emergency Medicine

## 2019-01-21 DIAGNOSIS — E119 Type 2 diabetes mellitus without complications: Secondary | ICD-10-CM | POA: Insufficient documentation

## 2019-01-21 DIAGNOSIS — Z7984 Long term (current) use of oral hypoglycemic drugs: Secondary | ICD-10-CM | POA: Insufficient documentation

## 2019-01-21 DIAGNOSIS — I10 Essential (primary) hypertension: Secondary | ICD-10-CM | POA: Insufficient documentation

## 2019-01-21 DIAGNOSIS — B029 Zoster without complications: Secondary | ICD-10-CM | POA: Insufficient documentation

## 2019-01-21 DIAGNOSIS — M25511 Pain in right shoulder: Secondary | ICD-10-CM | POA: Insufficient documentation

## 2019-01-21 DIAGNOSIS — Z79899 Other long term (current) drug therapy: Secondary | ICD-10-CM | POA: Insufficient documentation

## 2019-01-21 MED ORDER — OXYCODONE-ACETAMINOPHEN 5-325 MG PO TABS: 1.0000 | ORAL_TABLET | ORAL | 0 refills | Status: DC | PRN

## 2019-01-21 MED ORDER — VALACYCLOVIR HCL 1 G PO TABS: 1000.0000 mg | ORAL_TABLET | Freq: Three times a day (TID) | ORAL | 0 refills | Status: AC

## 2019-01-21 NOTE — ED Notes (Signed)
Pt appears to have "shingle" like rash to left rib cage running up side and into back

## 2019-01-21 NOTE — ED Provider Notes (Signed)
[facility] Emergency Department Provider Note   ____________________________________________   I have reviewed the triage vital signs and the nursing notes.   HISTORY  Chief Complaint Rash  History limited by: Not Limited   HPI Justin Landry is a 58 y.o. male who presents to the emergency department today with chief complaint of rash.  He states he noticed it to the left side of his abdomen.  Started roughly 1 week ago.  Has been constant since.  He does have some discomfort and pruritus with this rash.  States he has had similar pain in the past when he has had sugars have tried putting some fingernail polish over the rash without any relief.  He is also complaining of continued right shoulder pain.  He has had a history of chronic right shoulder issues and was seen in the emergency department a couple of weeks ago for acute on chronic right shoulder pain.  He continues to have this pain.  He denies any trauma since his recent ER visit a couple of weeks ago.  Per medical record review patient has a history of recent ER visit for right shoulder pain.  Diabetes, hypertension  Past Medical History:  Diagnosis Date  . Diabetes mellitus   . High cholesterol   . Humerus fracture    right  . Hypertension   . Inguinal hernia     Patient Active Problem List   Diagnosis Date Noted  . Essential hypertension 05/27/2015  . Type 2 diabetes mellitus without complication ([place]) 05/39/7673  . Inguinal hernia, bilateral  04/10/2014    Past Surgical History:  Procedure Laterality Date  . CYSTOSCOPY  2015   Biopsy  . HERNIA REPAIR      Prior to Admission medications   Medication Sig Start Date End Date Taking? Authorizing Provider  acetaminophen (TYLENOL) 500 MG tablet Take 1 tablet (500 mg total) by mouth every 6 (six) hours as needed. Patient taking differently: Take 1,500-2,000 mg by mouth every 6 (six) hours as needed for mild pain, moderate pain, fever  or headache.  10/13/15   Waynetta Pean, PA-C  Blood Glucose Monitoring Suppl (ONE TOUCH ULTRA SYSTEM KIT) W/DEVICE KIT 1 kit by Does not apply route once. 08/28/15   Jearld Fenton, NP  cephALEXin (KEFLEX) 500 MG capsule Take 1 capsule (500 mg total) by mouth 3 (three) times daily. 12/24/16   Hagler, Jami L, PA-C  cetirizine (ZYRTEC ALLERGY) 10 MG tablet Take 1 tablet (10 mg total) by mouth daily. Patient taking differently: Take 10 mg by mouth daily as needed for allergies.  10/13/15   Waynetta Pean, PA-C  cholecalciferol (VITAMIN D) 1000 UNITS tablet Take 1,000 Units by mouth every other day.     [provider]  Cyanocobalamin (VITAMIN B 12 PO) Take 1 tablet by mouth daily.    [provider]  Fish Oil-Cholecalciferol (FISH OIL + D3) 1000-1000 MG-UNIT CAPS Take 1 capsule by mouth daily with breakfast.     [provider]  gabapentin (NEURONTIN) 300 MG capsule TAKE 1 CAPSULE BY MOUTH 3 TIMES A DAY 08/08/15   Baity, Regina W, NP  glucose blood (ONE TOUCH TEST STRIPS) test strip Use as instructed 08/28/15   Jearld Fenton, NP  hydrocodone-ibuprofen (VICOPROFEN) 5-200 MG tablet Take 2 tablets by mouth every 8 (eight) hours as needed for pain. Reported on 04/26/2016    [provider]  ibuprofen (ADVIL,MOTRIN) 200 MG tablet Take 1,000 mg by mouth every 6 (six)  hours as needed for fever, headache, mild pain, moderate pain or cramping.    [provider]  L-Arginine 500 MG CAPS Take 2 capsules by mouth daily.     [provider]  Lancets [facility] ULTRASOFT) lancets 1 each by Other route 2 (two) times daily. Use as instructed 08/28/15   Jearld Fenton, NP  lisinopril (PRINIVIL,ZESTRIL) 40 MG tablet Take 40 mg by mouth daily.    [provider]  lisinopril-hydrochlorothiazide (PRINZIDE,ZESTORETIC) 20-25 MG tablet Take 1 tablet by mouth daily. 01/09/16   Jearld Fenton, NP  magnesium oxide (MAG-OX) 400 MG tablet Take 800 mg by mouth at  bedtime.     [provider]  meloxicam (MOBIC) 15 MG tablet Take 1 tablet (15 mg total) by mouth daily. 12/27/16   Cuthriell, Charline Bills, PA-C  metFORMIN (GLUCOPHAGE) 1000 MG tablet Take 1 tablet (1,000 mg total) by mouth 2 (two) times daily with a meal. MUST SCHEDULE FOLLOW UP OFFICE VISIT Patient taking differently: Take 1,000 mg by mouth daily. MUST SCHEDULE FOLLOW UP OFFICE VISIT 12/23/15   Jearld Fenton, NP  ondansetron (ZOFRAN) 4 MG tablet Take 1 tablet (4 mg total) by mouth daily as needed for nausea or vomiting. 06/08/16   Lavonia Drafts, MD  oxyCODONE-acetaminophen (PERCOCET) 7.5-325 MG tablet Take 1 tablet by mouth every 6 (six) hours as needed. Take with over-the-counter ibuprofen or 200 mg. 01/04/19   Sable Feil, PA-C  pregabalin (LYRICA) 50 MG capsule Take 100 mg by mouth daily as needed (pain).     [provider]  simvastatin (ZOCOR) 20 MG tablet Take 1 tablet (20 mg total) by mouth at bedtime. 05/28/15   Jearld Fenton, NP  Taurine 500 MG CAPS Take 1 capsule by mouth daily.    [provider]  vitamin C (ASCORBIC ACID) 500 MG tablet Take 500 mg by mouth daily.    [provider]    Allergies Morphine and related; Oxycontin [oxycodone hcl]; and Penicillins  Family History  Problem Relation Age of Onset  . Diabetes Mother   . Hypertension Mother   . Diabetes Father   . Hypertension Father   . Cancer Brother        brain    Social History Social History   Tobacco Use  . Smoking status: Current Every Day Smoker    Packs/day: 1.00    Types: Cigarettes  . Smokeless tobacco: Never Used  Substance Use Topics  . Alcohol use: Yes    Alcohol/week: 0.0 standard drinks    Comment: occasional  . Drug use: No    Review of Systems Constitutional: No fever/chills Eyes: No visual changes. ENT: No sore throat. Cardiovascular: Denies chest pain. Respiratory: Denies shortness of breath. Gastrointestinal: No abdominal pain.  No nausea,  no vomiting.  No diarrhea.   Genitourinary: Negative for dysuria. Musculoskeletal: Positive for right shoulder pain Skin: Positive for rash to left side of the abdomen Neurological: Negative for headaches, focal weakness or numbness.  ____________________________________________   PHYSICAL EXAM:  VITAL SIGNS: ED Triage Vitals  Enc Vitals Group     BP 01/21/19 1007 (!) 203/107     Pulse Rate 01/21/19 1007 99     Resp 01/21/19 1007 16     Temp 01/21/19 1007 97.8 F (36.6 C)     Temp Source 01/21/19 1007 Oral     SpO2 01/21/19 1007 98 %     Weight 01/21/19 1003 214 lb (97.1 kg)     Height  01/21/19 1003 5' 7"  (1.702 m)     Head Circumference --      Peak Flow --      Pain Score 01/21/19 1002 6    Constitutional: Alert and oriented.  Eyes: Conjunctivae are normal.  ENT      Head: Normocephalic and atraumatic.      Nose: No congestion/rhinnorhea.      Mouth/Throat: Mucous membranes are moist.      Neck: No stridor. Hematological/Lymphatic/Immunilogical: No cervical lymphadenopathy. Cardiovascular: Normal rate, regular rhythm.  No murmurs, rubs, or gallops.  Respiratory: Normal respiratory effort without tachypnea nor retractions. Breath sounds are clear and equal bilaterally. No wheezes/rales/rhonchi. Gastrointestinal: Soft and non tender. No rebound. No guarding.  Genitourinary: Deferred Musculoskeletal: Right shoulder, tender, limited range of motion secondary to pain. Neurologic:  Normal speech and language. No gross focal neurologic deficits are appreciated.  Skin: Herpetic type rash to left abdomen. Psychiatric: Mood and affect are normal. Speech and behavior are normal. Patient exhibits appropriate insight and judgment.  ____________________________________________    LABS (pertinent positives/negatives)  None  ____________________________________________   EKG  I, Nance Pear, attending physician, personally viewed and interpreted this EKG  EKG Time:  1054 Rate: 82 Rhythm: sinus rhythm Axis: right axis deviation Intervals: qtc 441 QRS: RVH, q waves II, III, aVF ST changes:  no st elevation Impression: abnormal ekg   ____________________________________________    RADIOLOGY  None  ____________________________________________   PROCEDURES  Procedures  ____________________________________________   INITIAL IMPRESSION / ASSESSMENT AND PLAN / ED COURSE  Pertinent labs & imaging results that were available during my care of the patient were reviewed by me and considered in my medical decision making (see chart for details).   Patient presented to the emergency department today with primary complaint of a rash to the left side of his abdomen.  Exam is consistent with shingles.  I discussed this with the patient.  Discussed trying to avoid pregnant patients as well as the elderly.  In terms of the shingles will give patient prescription for Valtrex as well as pain medication.  Terms of the right shoulder pain discussed with patient importance of orthopedic follow-up.  ____________________________________________   FINAL CLINICAL IMPRESSION(S) / ED DIAGNOSES  Final diagnoses:  Herpes zoster without complication  Right shoulder pain, unspecified chronicity     Note: This dictation was prepared with Dragon dictation. Any transcriptional errors that result from this process are unintentional     Nance Pear, MD 01/21/19 1134

## 2019-01-21 NOTE — ED Triage Notes (Signed)
Pt reports messed up his rotator cuff on the right side a while back. Pt reports was seen here for a few weeks ago for the same but now it is back. Pt also states he has a rash on his left side that came up out of nowhere. States it stings and starts on his left flank and around to his abd.

## 2019-01-21 NOTE — Discharge Instructions (Addendum)
As discussed please avoid contact with pregnant patients until the rash has resolved. Please seek medical attention for any high fevers, chest pain, shortness of breath, change in behavior, persistent vomiting, bloody stool or any other new or concerning symptoms.

## 2019-02-18 ENCOUNTER — Other Ambulatory Visit: Payer: Self-pay

## 2019-02-18 ENCOUNTER — Emergency Department
Admission: EM | Admit: 2019-02-18 | Discharge: 2019-02-18 | Disposition: A | Discharge status: Home / Self Care | Payer: Self-pay | Attending: Emergency Medicine | Admitting: Emergency Medicine

## 2019-02-18 DIAGNOSIS — M25511 Pain in right shoulder: Secondary | ICD-10-CM | POA: Insufficient documentation

## 2019-02-18 DIAGNOSIS — G8929 Other chronic pain: Secondary | ICD-10-CM

## 2019-02-18 DIAGNOSIS — Z7984 Long term (current) use of oral hypoglycemic drugs: Secondary | ICD-10-CM | POA: Insufficient documentation

## 2019-02-18 DIAGNOSIS — F17210 Nicotine dependence, cigarettes, uncomplicated: Secondary | ICD-10-CM | POA: Insufficient documentation

## 2019-02-18 DIAGNOSIS — E119 Type 2 diabetes mellitus without complications: Secondary | ICD-10-CM | POA: Insufficient documentation

## 2019-02-18 DIAGNOSIS — Z79899 Other long term (current) drug therapy: Secondary | ICD-10-CM | POA: Insufficient documentation

## 2019-02-18 DIAGNOSIS — I10 Essential (primary) hypertension: Secondary | ICD-10-CM | POA: Insufficient documentation

## 2019-02-18 MED ORDER — KETOROLAC TROMETHAMINE 30 MG/ML IJ SOLN
30.0000 mg | Freq: Once | INTRAMUSCULAR | Status: AC
  Administered 2019-02-18: 30 mg via INTRAMUSCULAR
  Filled 2019-02-18: qty 1

## 2019-02-18 MED ORDER — MELOXICAM 15 MG PO TABS: 15.0000 mg | ORAL_TABLET | Freq: Every day | ORAL | 1 refills | Status: AC

## 2019-02-18 NOTE — Discharge Instructions (Signed)
You have been diagnosed with a rotator cuff tear of the right shoulder. The rotator cuff is a group of 4 muscles that helps to give your shoulder movement and stability. The reason why you cannot raise your right arm to the side and over your head is due to a tear in these muscles. You will need to be seen by an orthopedist, which is a bone and muscle doctor. You can apply ice to the right shoulder for 15 minutes for each hour that you are sitting and relaxing. You can take meloxicam which is the medicine you are prescribed today once daily. You can also wear the sling given in the emergency department daily for comfort.

## 2019-02-18 NOTE — ED Provider Notes (Signed)
[facility] Emergency Department Provider Note  ____________________________________________  Time seen: Approximately 3:44 PM  I have reviewed the triage vital signs and the nursing notes.   HISTORY  Chief Complaint Shoulder Pain    HPI Justin Landry is a 58 y.o. male presents to the emergency department with chronic right shoulder pain.  Patient reports that he has had 10 out of 10 nonradiating right shoulder pain for approximately 1 month.  Patient was seen in January 2020 with similar complaints.  Patient is under the impression that he has a right humeral fracture.  X-rays were reviewed from encounter on 01/04/2019 and patient had no acute fractures but had likely right rotator cuff tear.  Patient reports difficulty with abduction at the right shoulder.  He denies numbness or tingling of the right upper extremity.  No recent falls or traumas.  Patient reports that he has not been under the care of orthopedics.  No other alleviating measures have been attempted.        Past Medical History:  Diagnosis Date  . Diabetes mellitus   . High cholesterol   . Humerus fracture    right  . Hypertension   . Inguinal hernia     Patient Active Problem List   Diagnosis Date Noted  . Essential hypertension 05/27/2015  . Type 2 diabetes mellitus without complication ([place]) 99/35/7017  . Inguinal hernia, bilateral  04/10/2014    Past Surgical History:  Procedure Laterality Date  . CYSTOSCOPY  2015   Biopsy  . HERNIA REPAIR      Prior to Admission medications   Medication Sig Start Date End Date Taking? Authorizing Provider  acetaminophen (TYLENOL) 500 MG tablet Take 1 tablet (500 mg total) by mouth every 6 (six) hours as needed. Patient taking differently: Take 1,500-2,000 mg by mouth every 6 (six) hours as needed for mild pain, moderate pain, fever or headache.  10/13/15   Waynetta Pean, PA-C  Blood Glucose Monitoring Suppl (ONE TOUCH ULTRA SYSTEM  KIT) W/DEVICE KIT 1 kit by Does not apply route once. 08/28/15   Jearld Fenton, NP  cephALEXin (KEFLEX) 500 MG capsule Take 1 capsule (500 mg total) by mouth 3 (three) times daily. 12/24/16   Hagler, Jami L, PA-C  cetirizine (ZYRTEC ALLERGY) 10 MG tablet Take 1 tablet (10 mg total) by mouth daily. Patient taking differently: Take 10 mg by mouth daily as needed for allergies.  10/13/15   Waynetta Pean, PA-C  cholecalciferol (VITAMIN D) 1000 UNITS tablet Take 1,000 Units by mouth every other day.     [provider]  Cyanocobalamin (VITAMIN B 12 PO) Take 1 tablet by mouth daily.    [provider]  Fish Oil-Cholecalciferol (FISH OIL + D3) 1000-1000 MG-UNIT CAPS Take 1 capsule by mouth daily with breakfast.     [provider]  gabapentin (NEURONTIN) 300 MG capsule TAKE 1 CAPSULE BY MOUTH 3 TIMES A DAY 08/08/15   Baity, Regina W, NP  glucose blood (ONE TOUCH TEST STRIPS) test strip Use as instructed 08/28/15   Jearld Fenton, NP  hydrocodone-ibuprofen (VICOPROFEN) 5-200 MG tablet Take 2 tablets by mouth every 8 (eight) hours as needed for pain. Reported on 04/26/2016    [provider]  ibuprofen (ADVIL,MOTRIN) 200 MG tablet Take 1,000 mg by mouth every 6 (six) hours as needed for fever, headache, mild pain, moderate pain or cramping.    [provider]  L-Arginine 500 MG CAPS Take 2 capsules by mouth daily.  [provider]  Lancets [facility] ULTRASOFT) lancets 1 each by Other route 2 (two) times daily. Use as instructed 08/28/15   Jearld Fenton, NP  lisinopril (PRINIVIL,ZESTRIL) 40 MG tablet Take 40 mg by mouth daily.    [provider]  lisinopril-hydrochlorothiazide (PRINZIDE,ZESTORETIC) 20-25 MG tablet Take 1 tablet by mouth daily. 01/09/16   Jearld Fenton, NP  magnesium oxide (MAG-OX) 400 MG tablet Take 800 mg by mouth at bedtime.     [provider]  meloxicam (MOBIC) 15 MG tablet Take 1 tablet (15 mg total) by mouth  daily for 7 days. 02/18/19 02/25/19  Lannie Fields, PA-C  metFORMIN (GLUCOPHAGE) 1000 MG tablet Take 1 tablet (1,000 mg total) by mouth 2 (two) times daily with a meal. MUST SCHEDULE FOLLOW UP OFFICE VISIT Patient taking differently: Take 1,000 mg by mouth daily. MUST SCHEDULE FOLLOW UP OFFICE VISIT 12/23/15   Jearld Fenton, NP  ondansetron (ZOFRAN) 4 MG tablet Take 1 tablet (4 mg total) by mouth daily as needed for nausea or vomiting. 06/08/16   Lavonia Drafts, MD  oxyCODONE-acetaminophen (PERCOCET) 5-325 MG tablet Take 1 tablet by mouth every 4 (four) hours as needed for severe pain. 01/21/19 01/21/20  Nance Pear, MD  oxyCODONE-acetaminophen (PERCOCET) 7.5-325 MG tablet Take 1 tablet by mouth every 6 (six) hours as needed. Take with over-the-counter ibuprofen or 200 mg. 01/04/19   Sable Feil, PA-C  pregabalin (LYRICA) 50 MG capsule Take 100 mg by mouth daily as needed (pain).     [provider]  simvastatin (ZOCOR) 20 MG tablet Take 1 tablet (20 mg total) by mouth at bedtime. 05/28/15   Jearld Fenton, NP  Taurine 500 MG CAPS Take 1 capsule by mouth daily.    [provider]  vitamin C (ASCORBIC ACID) 500 MG tablet Take 500 mg by mouth daily.    [provider]    Allergies Morphine and related; Oxycontin [oxycodone hcl]; and Penicillins  Family History  Problem Relation Age of Onset  . Diabetes Mother   . Hypertension Mother   . Diabetes Father   . Hypertension Father   . Cancer Brother        brain    Social History Social History   Tobacco Use  . Smoking status: Current Every Day Smoker    Packs/day: 1.00    Types: Cigarettes  . Smokeless tobacco: Never Used  Substance Use Topics  . Alcohol use: Yes    Alcohol/week: 0.0 standard drinks    Comment: occasional  . Drug use: No     Review of Systems  Constitutional: No fever/chills Eyes: No visual changes. No discharge ENT: No upper respiratory complaints. Cardiovascular: no chest  pain. Respiratory: no cough. No SOB. Gastrointestinal: No abdominal pain.  No nausea, no vomiting.  No diarrhea.  No constipation. Genitourinary: Negative for dysuria. No hematuria Musculoskeletal: Patient has right shoulder pain.  Skin: Negative for rash, abrasions, lacerations, ecchymosis. Neurological: Negative for headaches, focal weakness or numbness.  ____________________________________________   PHYSICAL EXAM:  VITAL SIGNS: ED Triage Vitals  Enc Vitals Group     BP 02/18/19 1416 (!) 186/103     Pulse Rate 02/18/19 1416 (!) 107     Resp 02/18/19 1416 14     Temp 02/18/19 1416 98.6 F (37 C)     Temp Source 02/18/19 1416 Oral     SpO2 02/18/19 1416 97 %     Weight 02/18/19 1417 212 lb (96.2 kg)  Height --      Head Circumference --      Peak Flow --      Pain Score 02/18/19 1416 6     Pain Loc --      Pain Edu? --      Excl. in [place]? --      Constitutional: Alert and oriented. Well appearing and in no acute distress. Eyes: Conjunctivae are normal. PERRL. EOMI. Head: Atraumatic. ENT:      Ears: TMs are pearly.      Nose: No congestion/rhinnorhea.      Mouth/Throat: Mucous membranes are moist.  Neck: No stridor.  No cervical spine tenderness to palpation.  Cardiovascular: Normal rate, regular rhythm. Normal S1 and S2.  Good peripheral circulation. Respiratory: Normal respiratory effort without tachypnea or retractions. Lungs CTAB. Good air entry to the bases with no decreased or absent breath sounds. Gastrointestinal: Bowel sounds 4 quadrants. Soft and nontender to palpation. No guarding or rigidity. No palpable masses. No distention. No CVA tenderness. Musculoskeletal: Patient has no pain with palpation over the right AC joint.  He has difficulty performing abduction of the right shoulder and has right rotator cuff weakness.  Palpable radial pulse, right. Neurologic:  Normal speech and language. No gross focal neurologic deficits are appreciated.  Skin:  Skin is  warm, dry and intact. No rash noted. Psychiatric: Mood and affect are normal. Speech and behavior are normal. Patient exhibits appropriate insight and judgement.   ____________________________________________   LABS (all labs ordered are listed, but only abnormal results are displayed)  Labs Reviewed - No data to display ____________________________________________  EKG   ____________________________________________  RADIOLOGY   No results found.  ____________________________________________    PROCEDURES  Procedure(s) performed:    Procedures    Medications  ketorolac (TORADOL) 30 MG/ML injection 30 mg (has no administration in time range)     ____________________________________________   INITIAL IMPRESSION / ASSESSMENT AND PLAN / ED COURSE  Pertinent labs & imaging results that were available during my care of the patient were reviewed by me and considered in my medical decision making (see chart for details).  Review of the [place] CSRS was performed in accordance of the [place] prior to dispensing any controlled drugs.           Assessment and plan Rotator cuff tear Patient presents to the emergency department with right shoulder pain for the past month.  History and physical exam findings are consistent with a rotator cuff tear.  Patient was placed in a sling in the emergency department he was prescribed meloxicam.  Patient was advised to apply ice nightly.  A referral to orthopedics was given, Dr. Posey Pronto.  All patient questions were answered.     ____________________________________________  FINAL CLINICAL IMPRESSION(S) / ED DIAGNOSES  Final diagnoses:  Chronic right shoulder pain      NEW MEDICATIONS STARTED DURING THIS VISIT:  ED Discharge Orders         Ordered    meloxicam (MOBIC) 15 MG tablet  Daily     02/18/19 1539              This chart was dictated using voice recognition software/Dragon. Despite best efforts to proofread,  errors can occur which can change the meaning. Any change was purely unintentional.    Lannie Fields, PA-C 02/18/19 1548    Carrie Mew, MD 02/20/19 0800

## 2019-02-18 NOTE — ED Triage Notes (Signed)
Pt presents via POV c/o right shoulder pain x1 month. Reports senn previously but pain has worsened.

## 2019-06-21 ENCOUNTER — Emergency Department
Admission: EM | Admit: 2019-06-21 | Discharge: 2019-06-21 | Disposition: A | Discharge status: Home / Self Care | Payer: Self-pay | Attending: Emergency Medicine | Admitting: Emergency Medicine

## 2019-06-21 ENCOUNTER — Other Ambulatory Visit: Payer: Self-pay

## 2019-06-21 DIAGNOSIS — L600 Ingrowing nail: Secondary | ICD-10-CM | POA: Insufficient documentation

## 2019-06-21 DIAGNOSIS — Z5321 Procedure and treatment not carried out due to patient leaving prior to being seen by health care provider: Secondary | ICD-10-CM | POA: Insufficient documentation

## 2019-06-21 LAB — COMPREHENSIVE METABOLIC PANEL
ALT: 30 U/L (ref 0–44)
AST: 26 U/L (ref 15–41)
Albumin: 4.2 g/dL (ref 3.5–5.0)
Alkaline Phosphatase: 75 U/L (ref 38–126)
Anion gap: 10 (ref 5–15)
BUN: 15 mg/dL (ref 6–20)
CO2: 23 mmol/L (ref 22–32)
Calcium: 9.4 mg/dL (ref 8.9–10.3)
Chloride: 101 mmol/L (ref 98–111)
Creatinine, Ser: 0.72 mg/dL (ref 0.61–1.24)
GFR calc Af Amer: >60 mL/min (ref >60)
GFR calc non Af Amer: >60 mL/min (ref >60)
Glucose, Bld: 230 mg/dL — ABNORMAL HIGH (ref 70–99)
Potassium: 3.6 mmol/L (ref 3.5–5.1)
Sodium: 134 mmol/L — ABNORMAL LOW (ref 135–145)
Total Bilirubin: 0.5 mg/dL (ref 0.3–1.2)
Total Protein: 7.8 g/dL (ref 6.5–8.1)

## 2019-06-21 LAB — CBC WITH DIFFERENTIAL/PLATELET
Abs Immature Granulocytes: 0.07 10*3/uL (ref 0.00–0.07)
Basophils Absolute: 0.1 10*3/uL (ref 0.0–0.1)
Basophils Relative: 1 %
Eosinophils Absolute: 0.2 10*3/uL (ref 0.0–0.5)
Eosinophils Relative: 2 %
HCT: 48.5 % (ref 39.0–52.0)
Hemoglobin: 16.9 g/dL (ref 13.0–17.0)
Immature Granulocytes: 1 %
Lymphocytes Relative: 19 %
Lymphs Abs: 2.8 10*3/uL (ref 0.7–4.0)
MCH: 30.8 pg (ref 26.0–34.0)
MCHC: 34.8 g/dL (ref 30.0–36.0)
MCV: 88.3 fL (ref 80.0–100.0)
Monocytes Absolute: 1.3 10*3/uL — ABNORMAL HIGH (ref 0.1–1.0)
Monocytes Relative: 9 %
Neutro Abs: 10 10*3/uL — ABNORMAL HIGH (ref 1.7–7.7)
Neutrophils Relative %: 68 %
Platelets: 317 10*3/uL (ref 150–400)
RBC: 5.49 MIL/uL (ref 4.22–5.81)
RDW: 12 % (ref 11.5–15.5)
WBC: 14.5 10*3/uL — ABNORMAL HIGH (ref 4.0–10.5)
nRBC: 0 % (ref 0.0–0.2)

## 2019-06-21 NOTE — ED Notes (Signed)
Patient not seen in lobby.  

## 2019-06-21 NOTE — ED Notes (Signed)
Called no answer. Not seen in lobby

## 2019-06-21 NOTE — ED Triage Notes (Signed)
Pt reports that he had ingrown toenail and he "cut it" (3-4 days ago) and now left great toe is great/swollen with redness extending into top of foot

## 2019-06-21 NOTE — ED Notes (Signed)
Called, no answer. Not seen in lobby 

## 2019-06-22 ENCOUNTER — Inpatient Hospital Stay
Admission: EM | Admit: 2019-06-22 | Discharge: 2019-06-24 | DRG: 617 | Disposition: A | Discharge status: Home / Self Care | Payer: Self-pay | Attending: Internal Medicine | Admitting: Internal Medicine

## 2019-06-22 ENCOUNTER — Encounter: Payer: Self-pay | Admitting: Emergency Medicine

## 2019-06-22 ENCOUNTER — Inpatient Hospital Stay: Payer: Self-pay

## 2019-06-22 ENCOUNTER — Other Ambulatory Visit: Payer: Self-pay

## 2019-06-22 DIAGNOSIS — E1169 Type 2 diabetes mellitus with other specified complication: Principal | ICD-10-CM | POA: Diagnosis present

## 2019-06-22 DIAGNOSIS — Z79899 Other long term (current) drug therapy: Secondary | ICD-10-CM

## 2019-06-22 DIAGNOSIS — E1140 Type 2 diabetes mellitus with diabetic neuropathy, unspecified: Secondary | ICD-10-CM | POA: Diagnosis present

## 2019-06-22 DIAGNOSIS — L03116 Cellulitis of left lower limb: Secondary | ICD-10-CM | POA: Diagnosis present

## 2019-06-22 DIAGNOSIS — Z8249 Family history of ischemic heart disease and other diseases of the circulatory system: Secondary | ICD-10-CM

## 2019-06-22 DIAGNOSIS — L03115 Cellulitis of right lower limb: Secondary | ICD-10-CM

## 2019-06-22 DIAGNOSIS — Z880 Allergy status to penicillin: Secondary | ICD-10-CM

## 2019-06-22 DIAGNOSIS — L0390 Cellulitis, unspecified: Secondary | ICD-10-CM | POA: Diagnosis present

## 2019-06-22 DIAGNOSIS — E785 Hyperlipidemia, unspecified: Secondary | ICD-10-CM | POA: Diagnosis present

## 2019-06-22 DIAGNOSIS — L600 Ingrowing nail: Secondary | ICD-10-CM | POA: Diagnosis present

## 2019-06-22 DIAGNOSIS — Z1159 Encounter for screening for other viral diseases: Secondary | ICD-10-CM

## 2019-06-22 DIAGNOSIS — I10 Essential (primary) hypertension: Secondary | ICD-10-CM | POA: Diagnosis present

## 2019-06-22 DIAGNOSIS — Z833 Family history of diabetes mellitus: Secondary | ICD-10-CM

## 2019-06-22 DIAGNOSIS — E875 Hyperkalemia: Secondary | ICD-10-CM | POA: Diagnosis present

## 2019-06-22 DIAGNOSIS — L03032 Cellulitis of left toe: Secondary | ICD-10-CM | POA: Diagnosis present

## 2019-06-22 DIAGNOSIS — Z888 Allergy status to other drugs, medicaments and biological substances status: Secondary | ICD-10-CM

## 2019-06-22 DIAGNOSIS — E7800 Pure hypercholesterolemia, unspecified: Secondary | ICD-10-CM | POA: Diagnosis present

## 2019-06-22 DIAGNOSIS — M86172 Other acute osteomyelitis, left ankle and foot: Secondary | ICD-10-CM | POA: Diagnosis present

## 2019-06-22 DIAGNOSIS — F17210 Nicotine dependence, cigarettes, uncomplicated: Secondary | ICD-10-CM | POA: Diagnosis present

## 2019-06-22 DIAGNOSIS — Z7984 Long term (current) use of oral hypoglycemic drugs: Secondary | ICD-10-CM

## 2019-06-22 LAB — GLUCOSE, CAPILLARY
Glucose-Capillary: 241 mg/dL — ABNORMAL HIGH (ref 70–99)
Glucose-Capillary: 276 mg/dL — ABNORMAL HIGH (ref 70–99)
Glucose-Capillary: 206 mg/dL — ABNORMAL HIGH (ref 70–99)

## 2019-06-22 LAB — LACTIC ACID, PLASMA: Lactic Acid, Venous: 1.6 mmol/L (ref 0.5–1.9)

## 2019-06-22 LAB — SARS CORONAVIRUS 2 BY RT PCR (HOSPITAL ORDER, PERFORMED IN ~~LOC~~ HOSPITAL LAB): SARS Coronavirus 2: NEGATIVE

## 2019-06-22 MED ORDER — LISINOPRIL 10 MG PO TABS
40.0000 mg | ORAL_TABLET | Freq: Every day | ORAL | Status: DC
  Administered 2019-06-22: 40 mg via ORAL
  Filled 2019-06-22 (×2): qty 4

## 2019-06-22 MED ORDER — SODIUM CHLORIDE 0.9 % IV SOLN
INTRAVENOUS | Status: DC
  Administered 2019-06-22: 13:00:00 via INTRAVENOUS

## 2019-06-22 MED ORDER — METFORMIN HCL 500 MG PO TABS
500.0000 mg | ORAL_TABLET | Freq: Every day | ORAL | Status: DC
  Filled 2019-06-22 (×2): qty 1

## 2019-06-22 MED ORDER — INSULIN ASPART 100 UNIT/ML ~~LOC~~ SOLN
0.0000 [IU] | Freq: Three times a day (TID) | SUBCUTANEOUS | Status: DC
  Administered 2019-06-22: 17:00:00 8 [IU] via SUBCUTANEOUS
  Administered 2019-06-22: 5 [IU] via SUBCUTANEOUS
  Administered 2019-06-23: 3 [IU] via SUBCUTANEOUS
  Administered 2019-06-23: 08:00:00 5 [IU] via SUBCUTANEOUS
  Administered 2019-06-23: 11 [IU] via SUBCUTANEOUS
  Administered 2019-06-24: 5 [IU] via SUBCUTANEOUS
  Filled 2019-06-22 (×6): qty 1

## 2019-06-22 MED ORDER — HYDROCODONE-ACETAMINOPHEN 7.5-325 MG PO TABS
1.0000 | ORAL_TABLET | ORAL | Status: DC | PRN
  Administered 2019-06-22 – 2019-06-24 (×6): 2 via ORAL
  Filled 2019-06-22 (×6): qty 2

## 2019-06-22 MED ORDER — HYDRALAZINE HCL 50 MG PO TABS
25.0000 mg | ORAL_TABLET | Freq: Two times a day (BID) | ORAL | Status: DC
  Administered 2019-06-23: 10:00:00 25 mg via ORAL
  Filled 2019-06-22: qty 1

## 2019-06-22 MED ORDER — ACETAMINOPHEN 650 MG RE SUPP: 650.0000 mg | Freq: Four times a day (QID) | RECTAL | Status: DC | PRN

## 2019-06-22 MED ORDER — NICOTINE 21 MG/24HR TD PT24
21.0000 mg | MEDICATED_PATCH | Freq: Every day | TRANSDERMAL | Status: DC
  Administered 2019-06-22 – 2019-06-24 (×3): 21 mg via TRANSDERMAL
  Filled 2019-06-22 (×3): qty 1

## 2019-06-22 MED ORDER — VITAMIN B 12 500 MCG PO TABS: ORAL_TABLET | Freq: Every day | ORAL | Status: DC

## 2019-06-22 MED ORDER — ENOXAPARIN SODIUM 40 MG/0.4ML ~~LOC~~ SOLN
40.0000 mg | SUBCUTANEOUS | Status: DC
  Administered 2019-06-24: 09:00:00 40 mg via SUBCUTANEOUS
  Filled 2019-06-22: qty 0.4

## 2019-06-22 MED ORDER — SENNOSIDES-DOCUSATE SODIUM 8.6-50 MG PO TABS: 1.0000 | ORAL_TABLET | Freq: Every evening | ORAL | Status: DC | PRN

## 2019-06-22 MED ORDER — ACETAMINOPHEN 325 MG PO TABS: 650.0000 mg | ORAL_TABLET | Freq: Four times a day (QID) | ORAL | Status: DC | PRN

## 2019-06-22 MED ORDER — LISINOPRIL 10 MG PO TABS: 40.0000 mg | ORAL_TABLET | Freq: Every day | ORAL | Status: DC

## 2019-06-22 MED ORDER — CLINDAMYCIN PHOSPHATE 600 MG/50ML IV SOLN
600.0000 mg | Freq: Three times a day (TID) | INTRAVENOUS | Status: DC
  Administered 2019-06-22 – 2019-06-24 (×6): 600 mg via INTRAVENOUS
  Filled 2019-06-22 (×9): qty 50

## 2019-06-22 MED ORDER — HYDROCHLOROTHIAZIDE 25 MG PO TABS
25.0000 mg | ORAL_TABLET | Freq: Every day | ORAL | Status: DC
  Administered 2019-06-22 – 2019-06-23 (×2): 25 mg via ORAL
  Filled 2019-06-22 (×2): qty 1

## 2019-06-22 MED ORDER — VANCOMYCIN HCL 10 G IV SOLR
1250.0000 mg | Freq: Two times a day (BID) | INTRAVENOUS | Status: DC
  Filled 2019-06-22 (×2): qty 1250

## 2019-06-22 MED ORDER — GLUCOSAMINE 1500 COMPLEX PO CAPS: ORAL_CAPSULE | Freq: Every day | ORAL | Status: DC

## 2019-06-22 MED ORDER — OXYCODONE-ACETAMINOPHEN 5-325 MG PO TABS: 1.0000 | ORAL_TABLET | ORAL | Status: DC | PRN

## 2019-06-22 MED ORDER — FISH OIL + D3 1000-1000 MG-UNIT PO CAPS: 1.0000 | ORAL_CAPSULE | Freq: Every day | ORAL | Status: DC

## 2019-06-22 MED ORDER — PIPERACILLIN-TAZOBACTAM 3.375 G IVPB 30 MIN
3.3750 g | Freq: Once | INTRAVENOUS | Status: AC
  Administered 2019-06-22: 3.375 g via INTRAVENOUS
  Filled 2019-06-22: qty 50

## 2019-06-22 MED ORDER — VITAMIN D 25 MCG (1000 UNIT) PO TABS
1000.0000 [IU] | ORAL_TABLET | Freq: Every day | ORAL | Status: DC
  Administered 2019-06-23 – 2019-06-24 (×2): 1000 [IU] via ORAL
  Filled 2019-06-22 (×2): qty 1

## 2019-06-22 MED ORDER — ONDANSETRON HCL 4 MG/2ML IJ SOLN
4.0000 mg | Freq: Four times a day (QID) | INTRAMUSCULAR | Status: DC | PRN
  Administered 2019-06-23: 4 mg via INTRAVENOUS
  Filled 2019-06-22: qty 2

## 2019-06-22 MED ORDER — VANCOMYCIN HCL 1.25 G IV SOLR
1250.0000 mg | Freq: Two times a day (BID) | INTRAVENOUS | Status: DC
  Administered 2019-06-22 – 2019-06-24 (×4): 1250 mg via INTRAVENOUS
  Filled 2019-06-22 (×7): qty 1250

## 2019-06-22 MED ORDER — HYDRALAZINE HCL 20 MG/ML IJ SOLN
10.0000 mg | INTRAMUSCULAR | Status: DC | PRN
  Administered 2019-06-23: 10 mg via INTRAVENOUS
  Filled 2019-06-22: qty 1

## 2019-06-22 MED ORDER — VANCOMYCIN HCL IN DEXTROSE 1-5 GM/200ML-% IV SOLN
1000.0000 mg | Freq: Once | INTRAVENOUS | Status: AC
  Administered 2019-06-22: 1000 mg via INTRAVENOUS
  Filled 2019-06-22: qty 200

## 2019-06-22 MED ORDER — INSULIN ASPART 100 UNIT/ML ~~LOC~~ SOLN: 0.0000 [IU] | Freq: Every day | SUBCUTANEOUS | Status: DC

## 2019-06-22 MED ORDER — ONDANSETRON HCL 4 MG PO TABS: 4.0000 mg | ORAL_TABLET | Freq: Four times a day (QID) | ORAL | Status: DC | PRN

## 2019-06-22 MED ORDER — VITAMIN B-12 100 MCG PO TABS
100.0000 ug | ORAL_TABLET | Freq: Every day | ORAL | Status: DC
  Administered 2019-06-23 – 2019-06-24 (×2): 100 ug via ORAL
  Filled 2019-06-22 (×2): qty 1

## 2019-06-22 MED ORDER — OMEGA-3-ACID ETHYL ESTERS 1 G PO CAPS
1.0000 g | ORAL_CAPSULE | Freq: Two times a day (BID) | ORAL | Status: DC
  Administered 2019-06-23 – 2019-06-24 (×3): 1 g via ORAL
  Filled 2019-06-22 (×3): qty 1

## 2019-06-22 NOTE — ED Notes (Signed)
ED TO INPATIENT HANDOFF REPORT  ED Nurse Name and Phone #:  Seward Speckngela, RN [phone]  S Name/Age/Gender Justin Landry 58 y.o. male Room/Bed: ED12A/ED12A  Code Status   Code Status: Full Code  Home/SNF/Other Home Patient oriented to: self, place, time and situation Is this baseline? Yes   Triage Complete: Triage complete  Chief Complaint Toe pain   Triage Note Pt here for left great toe pain.  Redness noted and moving up through foot.  Basic labs yesterday with elevated WBC.  Pt reports worse since yesterday. No fever + nausea. Pt is diabetic.   Allergies Allergies  Allergen Reactions  . Morphine And Related Nausea And Vomiting and Other (See Comments)    Extreme sweating   . Oxycontin [Oxycodone Hcl] Other (See Comments)    Extreme NAusea and vomitting follows (Patient Tolerates Percocet short acting)  . Penicillins Nausea And Vomiting    Has patient had a PCN reaction causing immediate rash, facial/tongue/throat swelling, SOB or lightheadedness with hypotension: No Has patient had a PCN reaction causing severe rash involving mucus membranes or skin necrosis: No Has patient had a PCN reaction that required hospitalization No Has patient had a PCN reaction occurring within the last 10 years: No If all of the above answers are "NO", then may proceed with Cephalosporin use.     Level of Care/Admitting Diagnosis ED Disposition    ED Disposition Condition Comment   Admit  [facility] [[id number]]  Level of Care: Med-Surg [16]  Covid Evaluation: Asymptomatic Screening Protocol (No Symptoms)  Diagnosis: Cellulitis [454098][[id number]]  Admitting Physician: Ihor AustinPYREDDY, PAVAN [[id number]]  Attending Physician: Ihor AustinPYREDDY, PAVAN [[id number]]  Estimated length of stay: past midnight tomorrow  Certification:: I certify this patient will need inpatient services for at least 2 midnights  PT Class (Do Not Modify): Inpatient [101]  PT Acc Code (Do Not Modify): Private  [1]       B Medical/Surgery History Past Medical History:  Diagnosis Date  . Diabetes mellitus   . High cholesterol   . Humerus fracture    right  . Hypertension   . Inguinal hernia    Past Surgical History:  Procedure Laterality Date  . CYSTOSCOPY  2015   Biopsy  . HERNIA REPAIR       A IV Location/Drains/Wounds Patient Lines/Drains/Airways Status   Active Line/Drains/Airways    Name:   Placement date:   Placement time:   Site:   Days:   Peripheral IV 06/22/19 Left;Anterior Antecubital   06/22/19    0839    Antecubital   less than 1          Intake/Output Last 24 hours No intake or output data in the 24 hours ending 06/22/19 1020  Labs/Imaging Results for orders placed or performed during the hospital encounter of 06/22/19 (from the past 48 hour(s))  Lactic acid, plasma     Status: None   Collection Time: 06/22/19  8:45 AM  Result Value Ref Range   Lactic Acid, Venous 1.6 0.5 - 1.9 mmol/L    Comment: Performed at [facility], [address] [place], [place] [address]  SARS Coronavirus 2 (CEPHEID - Performed in [facility] hospital lab), Hosp Order     Status: None   Collection Time: 06/22/19  9:06 AM   Specimen: Nasopharyngeal Swab  Result Value Ref Range   SARS Coronavirus 2 NEGATIVE NEGATIVE    Comment: (NOTE) If result is NEGATIVE SARS-CoV-2 target nucleic acids are NOT DETECTED. The  SARS-CoV-2 RNA is generally detectable in upper and lower  respiratory specimens during the acute phase of infection. The lowest  concentration of SARS-CoV-2 viral copies this assay can detect is 250  copies / mL. A negative result does not preclude SARS-CoV-2 infection  and should not be used as the sole basis for treatment or other  patient management decisions.  A negative result may occur with  improper specimen collection / handling, submission of specimen other  than nasopharyngeal swab, presence of viral mutation(s) within the  areas targeted by this  assay, and inadequate number of viral copies  (<250 copies / mL). A negative result must be combined with clinical  observations, patient history, and epidemiological information. If result is POSITIVE SARS-CoV-2 target nucleic acids are DETECTED. The SARS-CoV-2 RNA is generally detectable in upper and lower  respiratory specimens dur ing the acute phase of infection.  Positive  results are indicative of active infection with SARS-CoV-2.  Clinical  correlation with patient history and other diagnostic information is  necessary to determine patient infection status.  Positive results do  not rule out bacterial infection or co-infection with other viruses. If result is PRESUMPTIVE POSTIVE SARS-CoV-2 nucleic acids MAY BE PRESENT.   A presumptive positive result was obtained on the submitted specimen  and confirmed on repeat testing.  While 2019 novel coronavirus  (SARS-CoV-2) nucleic acids may be present in the submitted sample  additional confirmatory testing may be necessary for epidemiological  and / or clinical management purposes  to differentiate between  SARS-CoV-2 and other Sarbecovirus currently known to infect humans.  If clinically indicated additional testing with an alternate test  methodology [id number]) is advised. The SARS-CoV-2 RNA is generally  detectable in upper and lower respiratory sp ecimens during the acute  phase of infection. The expected result is Negative. Fact Sheet for Patients:  StrictlyIdeas.no Fact Sheet for Healthcare Providers: BankingDealers.co.za This test is not yet approved or cleared by the [place] FDA and has been authorized for detection and/or diagnosis of SARS-CoV-2 by FDA under an Emergency Use Authorization (EUA).  This EUA will remain in effect (meaning this test can be used) for the duration of the COVID-19 declaration under Section 564(b)(1) of the Act, 21 U.S.C. section 360bbb-3(b)(1),  unless the authorization is terminated or revoked sooner. Performed at [facility], [address] [place], [place] [address]    No results found.  Pending Labs Unresulted Labs (From admission, onward)    Start     Ordered   06/29/19 0500  Creatinine, serum  (enoxaparin (LOVENOX)    CrCl >/= 30 ml/min)  Weekly,   STAT    Comments: while on enoxaparin therapy    06/22/19 1011   06/23/19 0350  Basic metabolic panel  Tomorrow morning,   STAT     06/22/19 1011   06/23/19 0500  CBC  Tomorrow morning,   STAT     06/22/19 1011   06/22/19 1012  HIV antibody (Routine Testing)  Once,   STAT     06/22/19 1011   06/22/19 1012  CBC  (enoxaparin (LOVENOX)    CrCl >/= 30 ml/min)  Once,   STAT    Comments: Baseline for enoxaparin therapy IF NOT ALREADY DRAWN.  Notify MD if PLT < 100 K.    06/22/19 1011   06/22/19 1012  Creatinine, serum  (enoxaparin (LOVENOX)    CrCl >/= 30 ml/min)  Once,   STAT    Comments: Baseline for enoxaparin therapy IF  NOT ALREADY DRAWN.    06/22/19 1011   06/22/19 1002  Hemoglobin A1c  Once,   STAT    Comments: To assess prior glycemic control    06/22/19 1001   06/22/19 0850  Lactic acid, plasma  Now then every 2 hours,   STAT     06/22/19 0849          Vitals/Pain Today's Vitals   06/22/19 0815 06/22/19 0818 06/22/19 0819  BP: (!) 193/100    Pulse: 94    Resp: 17    Temp: 98.7 F (37.1 C)    TempSrc: Oral    SpO2: 97%    Weight:   92.5 kg  Height:   5\' 7"  (1.702 m)  PainSc:  7      Isolation Precautions No active isolations  Medications Medications  vancomycin (VANCOCIN) IVPB 1000 mg/200 mL premix (1,000 mg Intravenous New Bag/Given 06/22/19 1018)  lisinopril (ZESTRIL) tablet 40 mg (has no administration in time range)  metFORMIN (GLUCOPHAGE) tablet 500 mg (has no administration in time range)  Vitamin B 12 TABS (has no administration in time range)  Fish Oil + D3 1000-1000 MG-UNIT CAPS 1 capsule (has no administration in time  range)  Glucosamine 1500 Complex CAPS (has no administration in time range)  enoxaparin (LOVENOX) injection 40 mg (has no administration in time range)  0.9 %  sodium chloride infusion (has no administration in time range)  acetaminophen (TYLENOL) tablet 650 mg (has no administration in time range)    Or  acetaminophen (TYLENOL) suppository 650 mg (has no administration in time range)  senna-docusate (Senokot-S) tablet 1 tablet (has no administration in time range)  ondansetron (ZOFRAN) tablet 4 mg (has no administration in time range)    Or  ondansetron (ZOFRAN) injection 4 mg (has no administration in time range)  clindamycin (CLEOCIN) IVPB 600 mg (has no administration in time range)  insulin aspart (novoLOG) injection 0-15 Units (has no administration in time range)  insulin aspart (novoLOG) injection 0-5 Units (has no administration in time range)  piperacillin-tazobactam (ZOSYN) IVPB 3.375 g (0 g Intravenous Stopped 06/22/19 1013)    Mobility walks Low fall risk   Focused Assessments skin   R Recommendations: See Admitting Provider Note  Report given to:   Additional Notes:

## 2019-06-22 NOTE — Progress Notes (Signed)
Pharmacy Antibiotic Note  Justin Landry is a 59 y.o. male admitted on 06/22/2019 with cellulitis.  Pharmacy has been consulted for vancomcyin dosing. Per physician, purulent cellulitis of toe with streaks up right leg.  Patient states had recent ingrown toenail.  He does have DM.  He is also ordered clindamycin (? For possible gas producing organism or toxin production from strep).    Today, 06/22/2019   WBC elevated  SCr WNL  Afebrile  Patient given vanco 1gm x 1 in ED (and zosyn x1)  Plan: Vancomycin 1250 mg IV Q 12 hrs. Goal AUC 400-550. Give 1st dose ~4h after 1gm dose in ED to complete loading dose.  Expected AUC: 445 SCr used: 0.8  Follow renal function - check daily SCr for now  Check  Levels if remains on vancomycin > 4-5 days or change in renal function  F/u cultures if I&D performed   Height: 5\' 7"  (170.2 cm) Weight: 204 lb (92.5 kg) IBW/kg (Calculated) : 66.1  Temp (24hrs), Avg:98.7 F (37.1 C), Min:98.7 F (37.1 C), Max:98.7 F (37.1 C)  Recent Labs  Lab 06/21/19 1843 06/22/19 0845  WBC 14.5*  --   CREATININE 0.72  --   LATICACIDVEN  --  1.6    Estimated Creatinine Clearance: 111.9 mL/min (by C-G formula based on SCr of 0.72 mg/dL).    Allergies  Allergen Reactions  . Morphine And Related Nausea And Vomiting and Other (See Comments)    Extreme sweating   . Oxycontin [Oxycodone Hcl] Other (See Comments)    Extreme NAusea and vomitting follows (Patient Tolerates Percocet short acting)  . Penicillins Nausea And Vomiting    Has patient had a PCN reaction causing immediate rash, facial/tongue/throat swelling, SOB or lightheadedness with hypotension: No Has patient had a PCN reaction causing severe rash involving mucus membranes or skin necrosis: No Has patient had a PCN reaction that required hospitalization No Has patient had a PCN reaction occurring within the last 10 years: No If all of the above answers are "NO", then may proceed with  Cephalosporin use.     Antimicrobials this admission: 7/10 zosyn x1 7/10 vanco >> 7/10 clindamycin >>  Dose adjustments this admission:   Microbiology results: No cultures  Thank you for allowing pharmacy to be a part of this patient's care.  Doreene Eland, PharmD, BCPS.   Work Cell: [phone] 06/22/2019 11:10 AM

## 2019-06-22 NOTE — ED Provider Notes (Signed)
[facility] Emergency Department Provider Note   ____________________________________________    I have reviewed the triage vital signs and the nursing notes.   HISTORY  Chief Complaint toe infection     HPI Justin Landry is a 58 y.o. male with a history of diabetes who presents today with complaints of toe pain redness and nausea.  Patient describes trying to fix a ingrown toenail on his right toe about a week ago, since then worsening pain redness now extending up his right leg.  Describes nausea yesterday as well, does not think he has had any fevers.  Has not taken anything for this.  No cough myalgias or shortness of breath  Past Medical History:  Diagnosis Date  . Diabetes mellitus   . High cholesterol   . Humerus fracture    right  . Hypertension   . Inguinal hernia     Patient Active Problem List   Diagnosis Date Noted  . Essential hypertension 05/27/2015  . Type 2 diabetes mellitus without complication (HCC) 05/27/2015  . Inguinal hernia, bilateral  04/10/2014    Past Surgical History:  Procedure Laterality Date  . CYSTOSCOPY  2015   Biopsy  . HERNIA REPAIR      Prior to Admission medications   Medication Sig Start Date End Date Taking? Authorizing Provider  Cyanocobalamin (VITAMIN B 12 PO) Take 1 tablet by mouth daily.   Yes [provider]  Fish Oil-Cholecalciferol (FISH OIL + D3) 1000-1000 MG-UNIT CAPS Take 1 capsule by mouth daily with breakfast.    Yes [provider]  Glucosamine-Chondroit-Vit C-Mn (GLUCOSAMINE 1500 COMPLEX PO) Take 1 tablet by mouth daily.   Yes [provider]  lisinopril (PRINIVIL,ZESTRIL) 40 MG tablet Take 40 mg by mouth daily.   Yes [provider]  metFORMIN (GLUCOPHAGE) 500 MG tablet Take 500 mg by mouth daily.   Yes [provider]     Allergies Morphine and related, Oxycontin [oxycodone hcl], and Penicillins  Family History  Problem  Relation Age of Onset  . Diabetes Mother   . Hypertension Mother   . Diabetes Father   . Hypertension Father   . Cancer Brother        brain    Social History Social History   Tobacco Use  . Smoking status: Current Every Day Smoker    Packs/day: 1.00    Types: Cigarettes  . Smokeless tobacco: Never Used  Substance Use Topics  . Alcohol use: Yes    Alcohol/week: 0.0 standard drinks    Comment: occasional  . Drug use: No    Review of Systems  Constitutional: No fever/chills Eyes: No visual changes.  ENT: No sore throat. Cardiovascular: Denies chest pain. Respiratory: Denies shortness of breath. Gastrointestinal: No abdominal pain.  No vomiting Genitourinary: Negative for dysuria. Musculoskeletal: As above Skin: As above Neurological: Negative for headaches or weakness   ____________________________________________   PHYSICAL EXAM:  VITAL SIGNS: ED Triage Vitals  Enc Vitals Group     BP 06/22/19 0815 (!) 193/100     Pulse Rate 06/22/19 0815 94     Resp 06/22/19 0815 17     Temp 06/22/19 0815 98.7 F (37.1 C)     Temp Source 06/22/19 0815 Oral     SpO2 06/22/19 0815 97 %     Weight 06/22/19 0819 92.5 kg (204 lb)     Height 06/22/19 0819 1.702 m (5\' 7" )     Head Circumference --  Peak Flow --      Pain Score 06/22/19 0818 7     Pain Loc --      Pain Edu? --      Excl. in [place]? --     Constitutional: Alert and oriented.  Eyes: Conjunctivae are normal.   Nose: No congestion/rhinnorhea. Mouth/Throat: Mucous membranes are moist.    Cardiovascular: Normal rate, regular rhythm. Grossly normal heart sounds.  Good peripheral circulation. Respiratory: Normal respiratory effort.  No retractions. Lungs CTAB. Gastrointestinal: Soft and nontender. No distention.    Musculoskeletal: Right great toe, toenail is U-shaped digging into the skin bilaterally with swelling erythema extending up the patient's calf/streaking Neurologic:  Normal speech and language. No  gross focal neurologic deficits are appreciated.  Skin:  Skin is warm, dry and intac, as above Psychiatric: Mood and affect are normal. Speech and behavior are normal.  ____________________________________________   LABS (all labs ordered are listed, but only abnormal results are displayed)  Labs Reviewed  SARS CORONAVIRUS 2 (HOSPITAL ORDER, [facility])  LACTIC ACID, PLASMA  LACTIC ACID, PLASMA   ____________________________________________  EKG  None ____________________________________________  RADIOLOGY  None ____________________________________________   PROCEDURES  Procedure(s) performed: No  Procedures   Critical Care performed: No ____________________________________________   INITIAL IMPRESSION / ASSESSMENT AND PLAN / ED COURSE  Pertinent labs & imaging results that were available during my care of the patient were reviewed by me and considered in my medical decision making (see chart for details).  Patient had lab work done yesterday which demonstrated elevated white blood cell count of 18,000, he is afebrile here lactic acid is reassuring however exam is consistent with significant cellulitis, given his history of diabetes we will treat with IV antibiotics and admit to the hospital service    ____________________________________________   FINAL CLINICAL IMPRESSION(S) / ED DIAGNOSES  Final diagnoses:  Cellulitis of right lower extremity        Note:  This document was prepared using Dragon voice recognition software and may include unintentional dictation errors.   Lavonia Drafts, MD 06/22/19 [phone]

## 2019-06-22 NOTE — H&P (Addendum)
[facility] [place] [facility]   PATIENT NAME: Justin GuntherClarence Oehler    MR#:  [id number]  DATE OF BIRTH:  Apr 30, 1961  DATE OF ADMISSION:  06/22/2019  PRIMARY CARE PHYSICIAN: Lorre MunroeBaity, Regina W, NP   REQUESTING/REFERRING PHYSICIAN:   CHIEF COMPLAINT:   Chief Complaint  Patient presents with  . toe infection    HISTORY OF PRESENT ILLNESS: Justin Landry  is a 58 y.o. male with a known history of type 2 diabetes mellitus, hyperlipidemia, hypertension, inguinal hernia presented to the emergency room for left great toe pain with swelling of the left foot and redness and swelling around the left great toe.  Patient tried to pull out the ingrown toenail in the left toe.  He noticed swelling of the left foot and redness along with tenderness for the last couple of days.  Presented to the emergency room with cellulitis of the left foot.  Patient was started on IV antibiotics and hospitalist service was consulted.  COVID-19 test negative.  PAST MEDICAL HISTORY:   Past Medical History:  Diagnosis Date  . Diabetes mellitus   . High cholesterol   . Humerus fracture    right  . Hypertension   . Inguinal hernia     PAST SURGICAL HISTORY:  Past Surgical History:  Procedure Laterality Date  . CYSTOSCOPY  2015   Biopsy  . HERNIA REPAIR      SOCIAL HISTORY:  Social History   Tobacco Use  . Smoking status: Current Every Day Smoker    Packs/day: 1.00    Types: Cigarettes  . Smokeless tobacco: Never Used  Substance Use Topics  . Alcohol use: Yes    Alcohol/week: 0.0 standard drinks    Comment: occasional    FAMILY HISTORY:  Family History  Problem Relation Age of Onset  . Diabetes Mother   . Hypertension Mother   . Diabetes Father   . Hypertension Father   . Cancer Brother        brain    DRUG ALLERGIES:  Allergies  Allergen Reactions  . Morphine And Related Nausea And Vomiting and Other (See Comments)    Extreme sweating   . Oxycontin  [Oxycodone Hcl] Other (See Comments)    Extreme NAusea and vomitting follows (Patient Tolerates Percocet short acting)  . Penicillins Nausea And Vomiting    Has patient had a PCN reaction causing immediate rash, facial/tongue/throat swelling, SOB or lightheadedness with hypotension: No Has patient had a PCN reaction causing severe rash involving mucus membranes or skin necrosis: No Has patient had a PCN reaction that required hospitalization No Has patient had a PCN reaction occurring within the last 10 years: No If all of the above answers are "NO", then may proceed with Cephalosporin use.     REVIEW OF SYSTEMS:   CONSTITUTIONAL: No fever, fatigue or weakness.  EYES: No blurred or double vision.  EARS, NOSE, AND THROAT: No tinnitus or ear pain.  RESPIRATORY: No cough, shortness of breath, wheezing or hemoptysis.  CARDIOVASCULAR: No chest pain, orthopnea, edema.  GASTROINTESTINAL: No nausea, vomiting, diarrhea or abdominal pain.  GENITOURINARY: No dysuria, hematuria.  ENDOCRINE: No polyuria, nocturia,  HEMATOLOGY: No anemia, easy bruising or bleeding SKIN: Redness of skin around left big toe MUSCULOSKELETAL: Left foot swollen and red NEUROLOGIC: No tingling, numbness, weakness.  PSYCHIATRY: No anxiety or depression.   MEDICATIONS AT HOME:  Prior to Admission medications   Medication Sig Start Date End Date Taking? Authorizing Provider  Cyanocobalamin (VITAMIN B 12  PO) Take 1 tablet by mouth daily.   Yes [provider]  Fish Oil-Cholecalciferol (FISH OIL + D3) 1000-1000 MG-UNIT CAPS Take 1 capsule by mouth daily with breakfast.    Yes [provider]  Glucosamine-Chondroit-Vit C-Mn (GLUCOSAMINE 1500 COMPLEX PO) Take 1 tablet by mouth daily.   Yes [provider]  lisinopril (PRINIVIL,ZESTRIL) 40 MG tablet Take 40 mg by mouth daily.   Yes [provider]  metFORMIN (GLUCOPHAGE) 500 MG tablet Take 500 mg by mouth daily.   Yes [provider]      PHYSICAL EXAMINATION:   VITAL SIGNS: Blood pressure (!) 193/100, pulse 94, temperature 98.7 F (37.1 C), temperature source Oral, resp. rate 17, height 5\' 7"  (1.702 m), weight 92.5 kg, SpO2 97 %.  GENERAL:  58 y.o.-year-old patient lying in the bed with no acute distress.  EYES: Pupils equal, round, reactive to light and accommodation. No scleral icterus. Extraocular muscles intact.  HEENT: Head atraumatic, normocephalic. Oropharynx and nasopharynx clear.  NECK:  Supple, no jugular venous distention. No thyroid enlargement, no tenderness.  LUNGS: Normal breath sounds bilaterally, no wheezing, rales,rhonchi or crepitation. No use of accessory muscles of respiration.  CARDIOVASCULAR: S1, S2 normal. No murmurs, rubs, or gallops.  ABDOMEN: Soft, nontender, nondistended. Bowel sounds present. No organomegaly or mass.  EXTREMITIES: No pedal edema, cyanosis, clubbing Swelling of the left foot along with left big toe with redness Tenderness of left foot NEUROLOGIC: Cranial nerves II through XII are intact. Muscle strength 5/5 in all extremities. Sensation intact. Gait not checked.  PSYCHIATRIC: The patient is alert and oriented x 3.  SKIN: left big toe swollen and red     LABORATORY PANEL:   CBC Recent Labs  Lab 06/21/19 1843  WBC 14.5*  HGB 16.9  HCT 48.5  PLT 317  MCV 88.3  MCH 30.8  MCHC 34.8  RDW 12.0  LYMPHSABS 2.8  MONOABS 1.3*  EOSABS 0.2  BASOSABS 0.1   ------------------------------------------------------------------------------------------------------------------  Chemistries  Recent Labs  Lab 06/21/19 1843  NA 134*  K 3.6  CL 101  CO2 23  GLUCOSE 230*  BUN 15  CREATININE 0.72  CALCIUM 9.4  AST 26  ALT 30  ALKPHOS 75  BILITOT 0.5   ------------------------------------------------------------------------------------------------------------------ estimated creatinine clearance is 111.9 mL/min (by C-G formula based on SCr of 0.72 mg/dL).  ------------------------------------------------------------------------------------------------------------------ No results for input(s): TSH, T4TOTAL, T3FREE, THYROIDAB in the last 72 hours.  Invalid input(s): FREET3   Coagulation profile No results for input(s): INR, PROTIME in the last 168 hours. ------------------------------------------------------------------------------------------------------------------- No results for input(s): DDIMER in the last 72 hours. -------------------------------------------------------------------------------------------------------------------  Cardiac Enzymes No results for input(s): CKMB, TROPONINI, MYOGLOBIN in the last 168 hours.  Invalid input(s): CK ------------------------------------------------------------------------------------------------------------------ Invalid input(s): POCBNP  ---------------------------------------------------------------------------------------------------------------  Urinalysis    Component Value Date/Time   COLORURINE YELLOW (A) 06/07/2016 1741   APPEARANCEUR CLEAR (A) 06/07/2016 1741   APPEARANCEUR Clear 03/12/2014 1515   LABSPEC 1.024 06/07/2016 1741   LABSPEC 1.024 03/12/2014 1515   PHURINE 6.0 06/07/2016 1741   GLUCOSEU >500 (A) 06/07/2016 1741   GLUCOSEU >=500 03/12/2014 1515   HGBUR NEGATIVE 06/07/2016 1741   BILIRUBINUR NEGATIVE 06/07/2016 1741   BILIRUBINUR Negative 03/12/2014 1515   KETONESUR TRACE (A) 06/07/2016 1741   PROTEINUR 100 (A) 06/07/2016 1741   UROBILINOGEN 0.2 05/16/2015 0612   NITRITE NEGATIVE 06/07/2016 1741   LEUKOCYTESUR NEGATIVE 06/07/2016 1741   LEUKOCYTESUR Negative 03/12/2014 1515     RADIOLOGY: No results found.  EKG: Orders placed  or performed during the hospital encounter of 01/21/19  . ED EKG  . ED EKG    IMPRESSION AND PLAN: 58 year old male patient with a known history of type 2 diabetes mellitus, hyperlipidemia, hypertension, inguinal hernia  presented to the emergency room for left great toe pain with swelling of the left foot and redness and swelling around the left great toe.  -Left foot and left big toe cellulitis Start patient on IV vancomycin and clindamycin antibiotic Follow-up cultures and lactic acid level  -Left big toe ingrown toenail Podiatry consult  -Uncontrolled hypertension Could be from the pain in the left foot Pain management and PRN IV hydralazine  -Type 2 diabetes mellitus Diabetic diet with sliding scale coverage with insulin  -DVT prophylaxis with subcu Lovenox daily  -Tobacco abuse Tobacco cessation counseled to the patient for 6 minutes Nicotine patch offered  All the records are reviewed and case discussed with ED provider. Management plans discussed with the patient, family and they are in agreement.  CODE STATUS:Full code    Code Status Orders  (From admission, onward)         Start     Ordered   06/22/19 1012  Full code  Continuous     06/22/19 1011        Code Status History    This patient has a current code status but no historical code status.   Advance Care Planning Activity       TOTAL TIME TAKING CARE OF THIS PATIENT: 52 minutes.    Saundra Shelling M.D on 06/22/2019 at 10:52 AM  Between 7am to 6pm - Pager - [phone]  After 6pm go to [email or web address] - password [email or web address] [place] [facility]  Office  [phone]  CC: Primary care physician; Jearld Fenton, NP

## 2019-06-22 NOTE — Progress Notes (Signed)
Advanced care plan. Purpose of the Encounter: CODE STATUS Parties in Attendance: Patient Patient's Decision Capacity: Good Subjective/Patient's story: Justin Landry  is a 58 y.o. male with a known history of type 2 diabetes mellitus, hyperlipidemia, hypertension, inguinal hernia presented to the emergency room for left great toe pain with swelling of the left foot and redness and swelling around the left great toe.  Patient tried to pull out the ingrown toenail in the left toe.  He noticed swelling of the left foot and redness along with tenderness for the last couple of days.  Presented to the emergency room with cellulitis of the left foot.  Patient was started on IV antibiotics and hospitalist service was consulted.  COVID-19 test negative Objective/Medical story Patient needs IV antibiotics and fluids.  Needs podiatry evaluation for ingrown toenail.  Needs management for cellulitis of the foot. Goals of care determination:  Advance care directives goals of care and treatment plan discussed Patient wants everything done which includes CPR, intubation ventilator if the need arises. CODE STATUS: Full code Time spent discussing advanced care planning: 16 minutes

## 2019-06-22 NOTE — Progress Notes (Signed)
Dr Earleen Newport aware that pts BP has remained up despite lisinopril, per pt hasnt had his BP meds in a while and has been having BP issues, new med ordered

## 2019-06-22 NOTE — Progress Notes (Signed)
PHARMACIST - PHYSICIAN ORDER COMMUNICATION  CONCERNING: P&T Medication Policy on Herbal Medications  DESCRIPTION:  This patient's order for:  glucosamine  has been noted.  This product(s) is classified as an "herbal" or natural product. Due to a lack of definitive safety studies or FDA approval, nonstandard manufacturing practices, plus the potential risk of unknown drug-drug interactions while on inpatient medications, the Pharmacy and Therapeutics Committee does not permit the use of "herbal" or natural products of this type within [place].   ACTION TAKEN: The pharmacy department is unable to verify this order at this time and your patient has been informed of this safety policy. Please reevaluate patient's clinical condition at discharge and address if the herbal or natural product(s) should be resumed at that time.   

## 2019-06-22 NOTE — Consult Note (Signed)
Reason for Consult: Infection left great toe. Referring Physician: Pyreddy  Justin Landry is an 58 y.o. male.  HPI: This is a 58-year-old male recently admitted with some increased redness and swelling and pain in his left great toe.  States this is been going on for about 7 to 10 days.  He did try to pull a piece of toenail out and got some bleeding.  Otherwise denies any injury.  Past Medical History:  Diagnosis Date  . Diabetes mellitus   . High cholesterol   . Humerus fracture    right  . Hypertension   . Inguinal hernia     Past Surgical History:  Procedure Laterality Date  . CYSTOSCOPY  2015   Biopsy  . HERNIA REPAIR      Family History  Problem Relation Age of Onset  . Diabetes Mother   . Hypertension Mother   . Diabetes Father   . Hypertension Father   . Cancer Brother        brain    Social History:  reports that he has been smoking cigarettes. He has been smoking about 1.00 pack per day. He has never used smokeless tobacco. He reports current alcohol use. He reports that he does not use drugs.  Allergies:  Allergies  Allergen Reactions  . Morphine And Related Nausea And Vomiting and Other (See Comments)    Extreme sweating   . Oxycontin [Oxycodone Hcl] Other (See Comments)    Extreme NAusea and vomitting follows (Patient Tolerates Percocet short acting)  . Penicillins Nausea And Vomiting    Has patient had a PCN reaction causing immediate rash, facial/tongue/throat swelling, SOB or lightheadedness with hypotension: No Has patient had a PCN reaction causing severe rash involving mucus membranes or skin necrosis: No Has patient had a PCN reaction that required hospitalization No Has patient had a PCN reaction occurring within the last 10 years: No If all of the above answers are "NO", then may proceed with Cephalosporin use.     Medications:  Scheduled: . [START ON 06/23/2019] cholecalciferol  1,000 Units Oral Daily  . enoxaparin (LOVENOX)  injection  40 mg Subcutaneous Q24H  . insulin aspart  0-15 Units Subcutaneous TID WC  . insulin aspart  0-5 Units Subcutaneous QHS  . [START ON 06/23/2019] lisinopril  40 mg Oral Daily  . [START ON 06/23/2019] metFORMIN  500 mg Oral Q breakfast  . nicotine  21 mg Transdermal Daily  . [START ON 06/23/2019] omega-3 acid ethyl esters  1 g Oral BID  . [START ON 06/23/2019] vitamin B-12  100 mcg Oral Daily    Results for orders placed or performed during the hospital encounter of 06/22/19 (from the past 48 hour(s))  Lactic acid, plasma     Status: None   Collection Time: 06/22/19  8:45 AM  Result Value Ref Range   Lactic Acid, Venous 1.6 0.5 - 1.9 mmol/L    Comment: Performed at [facility], [address] [place], [place] [address]  SARS Coronavirus 2 (CEPHEID - Performed in [place] hospital lab), Hosp Order     Status: None   Collection Time: 06/22/19  9:06 AM   Specimen: Nasopharyngeal Swab  Result Value Ref Range   SARS Coronavirus 2 NEGATIVE NEGATIVE    Comment: (NOTE) If result is NEGATIVE SARS-CoV-2 target nucleic acids are NOT DETECTED. The SARS-CoV-2 RNA is generally detectable in upper and lower  respiratory specimens during the acute phase of infection. The lowest  concentration of SARS-CoV-2   viral copies this assay can detect is 250  copies / mL. A negative result does not preclude SARS-CoV-2 infection  and should not be used as the sole basis for treatment or other  patient management decisions.  A negative result may occur with  improper specimen collection / handling, submission of specimen other  than nasopharyngeal swab, presence of viral mutation(s) within the  areas targeted by this assay, and inadequate number of viral copies  (<250 copies / mL). A negative result must be combined with clinical  observations, patient history, and epidemiological information. If result is POSITIVE SARS-CoV-2 target nucleic acids are DETECTED. The SARS-CoV-2 RNA is  generally detectable in upper and lower  respiratory specimens dur ing the acute phase of infection.  Positive  results are indicative of active infection with SARS-CoV-2.  Clinical  correlation with patient history and other diagnostic information is  necessary to determine patient infection status.  Positive results do  not rule out bacterial infection or co-infection with other viruses. If result is PRESUMPTIVE POSTIVE SARS-CoV-2 nucleic acids MAY BE PRESENT.   A presumptive positive result was obtained on the submitted specimen  and confirmed on repeat testing.  While 2019 novel coronavirus  (SARS-CoV-2) nucleic acids may be present in the submitted sample  additional confirmatory testing may be necessary for epidemiological  and / or clinical management purposes  to differentiate between  SARS-CoV-2 and other Sarbecovirus currently known to infect humans.  If clinically indicated additional testing with an alternate test  methodology [phone]) is advised. The SARS-CoV-2 RNA is generally  detectable in upper and lower respiratory sp ecimens during the acute  phase of infection. The expected result is Negative. Fact Sheet for Patients:  StrictlyIdeas.no Fact Sheet for Healthcare Providers: BankingDealers.co.za This test is not yet approved or cleared by the [place] FDA and has been authorized for detection and/or diagnosis of SARS-CoV-2 by FDA under an Emergency Use Authorization (EUA).  This EUA will remain in effect (meaning this test can be used) for the duration of the COVID-19 declaration under Section 564(b)(1) of the Act, 21 U.S.C. section 360bbb-3(b)(1), unless the authorization is terminated or revoked sooner. Performed at [facility], [address] [place], [place] [address]   Glucose, capillary     Status: Abnormal   Collection Time: 06/22/19 11:40 AM  Result Value Ref Range   Glucose-Capillary 241 (H)  70 - 99 mg/dL    No results found.  Review of Systems  Constitutional: Negative for chills and fever.  HENT: Negative for congestion and sinus pain.   Eyes: Negative for blurred vision and double vision.  Respiratory: Negative for cough and shortness of breath.   Cardiovascular: Negative for chest pain and palpitations.  Gastrointestinal: Negative for nausea and vomiting.  Genitourinary: Negative for dysuria and urgency.  Musculoskeletal:       Relates some increased pain in his left great toe.  Denies injury  Skin:       Redness and swelling in his left great toe and foot.  Neurological:       He does relate some occasional numbness and paresthesias in the feet from his diabetes.  Endo/Heme/Allergies: Negative for polydipsia. Does not bruise/bleed easily.  Psychiatric/Behavioral: Negative for depression. The patient is not nervous/anxious.    Blood pressure (!) 188/112, pulse 81, temperature 97.8 F (36.6 C), temperature source Oral, resp. rate 18, height 5\' 7"  (1.702 m), weight 92.5 kg, SpO2 100 %. Physical Exam  Cardiovascular:  DP and PT pulses  are palpable.  Musculoskeletal:     Comments: Adequate range of motion of the pedal joints with some guarding around the left great toe.  Muscle testing deferred.  Neurological:  There is some loss of protective threshold with a monofilament wire in multiple toes on the right foot and all of the toes and forefoot on the left.  Proprioception impaired.  Skin:  The skin is warm dry and supple.  Significant erythema and edema in the left great toe extending onto the forefoot area.  The left hallux nail is severely thick and dystrophic and ingrown on the border.  Upon I&D a large amount of purulence is noted beneath the medial nail groove with clear exposure of bone from the distal phalanx.    Assessment/Plan: Assessment: 1.  Abscess with cellulitis left great toe. 2.  Osteomyelitis distal phalanx left great toe. 3.  Diabetes with  associated neuropathy.  Plan: The left hallux was anesthetized with 2% lidocaine plain.  An I&D was performed of the medial border of the nail with expression of the purulence.  A culture was taken for sensitivities.  Bacitracin and a sterile gauze bandage applied.  Discussed with the patient that the bone is obviously infected at this point.  Discussed that he will need to consider amputation of the distal aspect of his left great toe.  We will obtain x-rays for better evaluation.  Discussed possible risks and complications of the procedure including inability to heal due to his diabetes or extent of infection.  No guarantees could be given.  Questions invited and answered.  Discussed timing and the patient states he has not eaten since last night so we will keep him n.p.o. and plan for surgery later this afternoon.  Obtain a consent form for amputation left great toe.  Plan for surgery later this evening.  Ricci Barkerodd W Christoher Drudge 06/22/2019, 12:19 PM

## 2019-06-22 NOTE — H&P (View-Only) (Signed)
Reason for Consult: Infection left great toe. Referring Physician: Leanora IvanoffPyreddy  Justin Landry is an 58 y.o. male.  HPI: This is a 58 year old male recently admitted with some increased redness and swelling and pain in his left great toe.  States this is been going on for about 7 to 10 days.  He did try to pull a piece of toenail out and got some bleeding.  Otherwise denies any injury.  Past Medical History:  Diagnosis Date  . Diabetes mellitus   . High cholesterol   . Humerus fracture    right  . Hypertension   . Inguinal hernia     Past Surgical History:  Procedure Laterality Date  . CYSTOSCOPY  2015   Biopsy  . HERNIA REPAIR      Family History  Problem Relation Age of Onset  . Diabetes Mother   . Hypertension Mother   . Diabetes Father   . Hypertension Father   . Cancer Brother        brain    Social History:  reports that he has been smoking cigarettes. He has been smoking about 1.00 pack per day. He has never used smokeless tobacco. He reports current alcohol use. He reports that he does not use drugs.  Allergies:  Allergies  Allergen Reactions  . Morphine And Related Nausea And Vomiting and Other (See Comments)    Extreme sweating   . Oxycontin [Oxycodone Hcl] Other (See Comments)    Extreme NAusea and vomitting follows (Patient Tolerates Percocet short acting)  . Penicillins Nausea And Vomiting    Has patient had a PCN reaction causing immediate rash, facial/tongue/throat swelling, SOB or lightheadedness with hypotension: No Has patient had a PCN reaction causing severe rash involving mucus membranes or skin necrosis: No Has patient had a PCN reaction that required hospitalization No Has patient had a PCN reaction occurring within the last 10 years: No If all of the above answers are "NO", then may proceed with Cephalosporin use.     Medications:  Scheduled: . [START ON 06/23/2019] cholecalciferol  1,000 Units Oral Daily  . enoxaparin (LOVENOX)  injection  40 mg Subcutaneous Q24H  . insulin aspart  0-15 Units Subcutaneous TID WC  . insulin aspart  0-5 Units Subcutaneous QHS  . [START ON 06/23/2019] lisinopril  40 mg Oral Daily  . [START ON 06/23/2019] metFORMIN  500 mg Oral Q breakfast  . nicotine  21 mg Transdermal Daily  . [START ON 06/23/2019] omega-3 acid ethyl esters  1 g Oral BID  . [START ON 06/23/2019] vitamin B-12  100 mcg Oral Daily    Results for orders placed or performed during the hospital encounter of 06/22/19 (from the past 48 hour(s))  Lactic acid, plasma     Status: None   Collection Time: 06/22/19  8:45 AM  Result Value Ref Range   Lactic Acid, Venous 1.6 0.5 - 1.9 mmol/L    Comment: Performed at [facility], [address] [place], [place] [id number]  SARS Coronavirus 2 (CEPHEID - Performed in [facility] hospital lab), Hosp Order     Status: None   Collection Time: 06/22/19  9:06 AM   Specimen: Nasopharyngeal Swab  Result Value Ref Range   SARS Coronavirus 2 NEGATIVE NEGATIVE    Comment: (NOTE) If result is NEGATIVE SARS-CoV-2 target nucleic acids are NOT DETECTED. The SARS-CoV-2 RNA is generally detectable in upper and lower  respiratory specimens during the acute phase of infection. The lowest  concentration of SARS-CoV-2  viral copies this assay can detect is 250  copies / mL. A negative result does not preclude SARS-CoV-2 infection  and should not be used as the sole basis for treatment or other  patient management decisions.  A negative result may occur with  improper specimen collection / handling, submission of specimen other  than nasopharyngeal swab, presence of viral mutation(s) within the  areas targeted by this assay, and inadequate number of viral copies  (<250 copies / mL). A negative result must be combined with clinical  observations, patient history, and epidemiological information. If result is POSITIVE SARS-CoV-2 target nucleic acids are DETECTED. The SARS-CoV-2 RNA is  generally detectable in upper and lower  respiratory specimens dur ing the acute phase of infection.  Positive  results are indicative of active infection with SARS-CoV-2.  Clinical  correlation with patient history and other diagnostic information is  necessary to determine patient infection status.  Positive results do  not rule out bacterial infection or co-infection with other viruses. If result is PRESUMPTIVE POSTIVE SARS-CoV-2 nucleic acids MAY BE PRESENT.   A presumptive positive result was obtained on the submitted specimen  and confirmed on repeat testing.  While 2019 novel coronavirus  (SARS-CoV-2) nucleic acids may be present in the submitted sample  additional confirmatory testing may be necessary for epidemiological  and / or clinical management purposes  to differentiate between  SARS-CoV-2 and other Sarbecovirus currently known to infect humans.  If clinically indicated additional testing with an alternate test  methodology [phone]) is advised. The SARS-CoV-2 RNA is generally  detectable in upper and lower respiratory sp ecimens during the acute  phase of infection. The expected result is Negative. Fact Sheet for Patients:  StrictlyIdeas.no Fact Sheet for Healthcare Providers: BankingDealers.co.za This test is not yet approved or cleared by the [place] FDA and has been authorized for detection and/or diagnosis of SARS-CoV-2 by FDA under an Emergency Use Authorization (EUA).  This EUA will remain in effect (meaning this test can be used) for the duration of the COVID-19 declaration under Section 564(b)(1) of the Act, 21 U.S.C. section 360bbb-3(b)(1), unless the authorization is terminated or revoked sooner. Performed at [facility], [address] [place], [place] [address]   Glucose, capillary     Status: Abnormal   Collection Time: 06/22/19 11:40 AM  Result Value Ref Range   Glucose-Capillary 241 (H)  70 - 99 mg/dL    No results found.  Review of Systems  Constitutional: Negative for chills and fever.  HENT: Negative for congestion and sinus pain.   Eyes: Negative for blurred vision and double vision.  Respiratory: Negative for cough and shortness of breath.   Cardiovascular: Negative for chest pain and palpitations.  Gastrointestinal: Negative for nausea and vomiting.  Genitourinary: Negative for dysuria and urgency.  Musculoskeletal:       Relates some increased pain in his left great toe.  Denies injury  Skin:       Redness and swelling in his left great toe and foot.  Neurological:       He does relate some occasional numbness and paresthesias in the feet from his diabetes.  Endo/Heme/Allergies: Negative for polydipsia. Does not bruise/bleed easily.  Psychiatric/Behavioral: Negative for depression. The patient is not nervous/anxious.    Blood pressure (!) 188/112, pulse 81, temperature 97.8 F (36.6 C), temperature source Oral, resp. rate 18, height 5\' 7"  (1.702 m), weight 92.5 kg, SpO2 100 %. Physical Exam  Cardiovascular:  DP and PT pulses  are palpable.  Musculoskeletal:     Comments: Adequate range of motion of the pedal joints with some guarding around the left great toe.  Muscle testing deferred.  Neurological:  There is some loss of protective threshold with a monofilament wire in multiple toes on the right foot and all of the toes and forefoot on the left.  Proprioception impaired.  Skin:  The skin is warm dry and supple.  Significant erythema and edema in the left great toe extending onto the forefoot area.  The left hallux nail is severely thick and dystrophic and ingrown on the border.  Upon I&D a large amount of purulence is noted beneath the medial nail groove with clear exposure of bone from the distal phalanx.    Assessment/Plan: Assessment: 1.  Abscess with cellulitis left great toe. 2.  Osteomyelitis distal phalanx left great toe. 3.  Diabetes with  associated neuropathy.  Plan: The left hallux was anesthetized with 2% lidocaine plain.  An I&D was performed of the medial border of the nail with expression of the purulence.  A culture was taken for sensitivities.  Bacitracin and a sterile gauze bandage applied.  Discussed with the patient that the bone is obviously infected at this point.  Discussed that he will need to consider amputation of the distal aspect of his left great toe.  We will obtain x-rays for better evaluation.  Discussed possible risks and complications of the procedure including inability to heal due to his diabetes or extent of infection.  No guarantees could be given.  Questions invited and answered.  Discussed timing and the patient states he has not eaten since last night so we will keep him n.p.o. and plan for surgery later this afternoon.  Obtain a consent form for amputation left great toe.  Plan for surgery later this evening.  Ricci Barkerodd W Litisha Guagliardo 06/22/2019, 12:19 PM

## 2019-06-22 NOTE — Anesthesia Preprocedure Evaluation (Deleted)
Anesthesia Evaluation    Airway        Dental   Pulmonary Current Smoker,           Cardiovascular hypertension,      Neuro/Psych    GI/Hepatic   Endo/Other  diabetes  Renal/GU      Musculoskeletal   Abdominal   Peds  Hematology   Anesthesia Other Findings Past Medical History: No date: Diabetes mellitus No date: High cholesterol No date: Humerus fracture     Comment:  right No date: Hypertension No date: Inguinal hernia   Reproductive/Obstetrics                             Anesthesia Physical Anesthesia Plan Anesthesia Quick Evaluation

## 2019-06-22 NOTE — Progress Notes (Signed)
Patient ID: Justin Landry, male   DOB: [birth year], 58 y.o.   MRN: [id number]  OR is not looking promising for tonight.  We will plan for surgery tomorrow morning at 8:00.  Switch n.p.o. to midnight and he may go ahead and eat.

## 2019-06-22 NOTE — ED Triage Notes (Signed)
Pt here for left great toe pain.  Redness noted and moving up through foot.  Basic labs yesterday with elevated WBC.  Pt reports worse since yesterday. No fever + nausea. Pt is diabetic.

## 2019-06-23 ENCOUNTER — Encounter
Admission: EM | Disposition: A | Discharge status: Home / Self Care | Payer: Self-pay | Source: Home / Self Care | Attending: Internal Medicine

## 2019-06-23 ENCOUNTER — Inpatient Hospital Stay: Payer: Self-pay | Admitting: Anesthesiology

## 2019-06-23 ENCOUNTER — Encounter: Payer: Self-pay | Admitting: Anesthesiology

## 2019-06-23 HISTORY — PX: AMPUTATION TOE: SHX6595

## 2019-06-23 LAB — HEMOGLOBIN A1C
Hgb A1c MFr Bld: 9.8 % — ABNORMAL HIGH (ref 4.8–5.6)
Mean Plasma Glucose: 235 mg/dL

## 2019-06-23 LAB — BASIC METABOLIC PANEL
Anion gap: 9 (ref 5–15)
BUN: 12 mg/dL (ref 6–20)
CO2: 30 mmol/L (ref 22–32)
Calcium: 9.8 mg/dL (ref 8.9–10.3)
Chloride: 99 mmol/L (ref 98–111)
Creatinine, Ser: 0.72 mg/dL (ref 0.61–1.24)
GFR calc Af Amer: >60 mL/min (ref >60)
GFR calc non Af Amer: >60 mL/min (ref >60)
Glucose, Bld: 208 mg/dL — ABNORMAL HIGH (ref 70–99)
Potassium: 5.1 mmol/L (ref 3.5–5.1)
Sodium: 138 mmol/L (ref 135–145)

## 2019-06-23 LAB — CBC
HCT: 49.7 % (ref 39.0–52.0)
Hemoglobin: 17 g/dL (ref 13.0–17.0)
MCH: 30.3 pg (ref 26.0–34.0)
MCHC: 34.2 g/dL (ref 30.0–36.0)
MCV: 88.6 fL (ref 80.0–100.0)
Platelets: 301 10*3/uL (ref 150–400)
RBC: 5.61 MIL/uL (ref 4.22–5.81)
RDW: 12.1 % (ref 11.5–15.5)
WBC: 11.2 10*3/uL — ABNORMAL HIGH (ref 4.0–10.5)
nRBC: 0 % (ref 0.0–0.2)

## 2019-06-23 LAB — GLUCOSE, CAPILLARY
Glucose-Capillary: 237 mg/dL — ABNORMAL HIGH (ref 70–99)
Glucose-Capillary: 230 mg/dL — ABNORMAL HIGH (ref 70–99)
Glucose-Capillary: 342 mg/dL — ABNORMAL HIGH (ref 70–99)
Glucose-Capillary: 182 mg/dL — ABNORMAL HIGH (ref 70–99)
Glucose-Capillary: 173 mg/dL — ABNORMAL HIGH (ref 70–99)

## 2019-06-23 SURGERY — AMPUTATION, TOE
Anesthesia: General | Laterality: Left

## 2019-06-23 MED ORDER — HYDRALAZINE HCL 50 MG PO TABS
25.0000 mg | ORAL_TABLET | Freq: Three times a day (TID) | ORAL | Status: DC
  Administered 2019-06-23 – 2019-06-24 (×3): 25 mg via ORAL
  Filled 2019-06-23 (×3): qty 1

## 2019-06-23 MED ORDER — PROPOFOL 10 MG/ML IV BOLUS
INTRAVENOUS | Status: AC
  Filled 2019-06-23: qty 20

## 2019-06-23 MED ORDER — PROPOFOL 500 MG/50ML IV EMUL
INTRAVENOUS | Status: DC | PRN
  Administered 2019-06-23: 100 ug/kg/min via INTRAVENOUS

## 2019-06-23 MED ORDER — PROPOFOL 10 MG/ML IV BOLUS
INTRAVENOUS | Status: DC | PRN
  Administered 2019-06-23: 30 mg via INTRAVENOUS

## 2019-06-23 MED ORDER — FENTANYL CITRATE (PF) 100 MCG/2ML IJ SOLN
INTRAMUSCULAR | Status: DC | PRN
  Administered 2019-06-23: 50 ug via INTRAVENOUS
  Administered 2019-06-23 (×2): 25 ug via INTRAVENOUS

## 2019-06-23 MED ORDER — LACTATED RINGERS IV SOLN
INTRAVENOUS | Status: DC | PRN
  Administered 2019-06-23: 08:00:00 via INTRAVENOUS

## 2019-06-23 MED ORDER — MIDAZOLAM HCL 2 MG/2ML IJ SOLN
INTRAMUSCULAR | Status: DC | PRN
  Administered 2019-06-23: 2 mg via INTRAVENOUS

## 2019-06-23 MED ORDER — MIDAZOLAM HCL 2 MG/2ML IJ SOLN
INTRAMUSCULAR | Status: AC
  Filled 2019-06-23: qty 2

## 2019-06-23 MED ORDER — OXYCODONE-ACETAMINOPHEN 5-325 MG PO TABS
1.0000 | ORAL_TABLET | ORAL | Status: DC | PRN
  Administered 2019-06-23: 16:00:00 2 via ORAL
  Administered 2019-06-23: 12:00:00 1 via ORAL
  Filled 2019-06-23: qty 2
  Filled 2019-06-23: qty 1

## 2019-06-23 MED ORDER — SODIUM CHLORIDE 0.9 % IV SOLN
INTRAVENOUS | Status: DC
  Administered 2019-06-23 – 2019-06-24 (×2): via INTRAVENOUS

## 2019-06-23 MED ORDER — BUPIVACAINE HCL 0.5 % IJ SOLN
INTRAMUSCULAR | Status: DC | PRN
  Administered 2019-06-23: 10 mL

## 2019-06-23 MED ORDER — FENTANYL CITRATE (PF) 100 MCG/2ML IJ SOLN: 25.0000 ug | INTRAMUSCULAR | Status: DC | PRN

## 2019-06-23 MED ORDER — BUPIVACAINE HCL (PF) 0.5 % IJ SOLN
INTRAMUSCULAR | Status: AC
  Filled 2019-06-23: qty 30

## 2019-06-23 MED ORDER — FENTANYL CITRATE (PF) 100 MCG/2ML IJ SOLN
INTRAMUSCULAR | Status: AC
  Filled 2019-06-23: qty 2

## 2019-06-23 MED ORDER — PROMETHAZINE HCL 25 MG/ML IJ SOLN: 6.2500 mg | INTRAMUSCULAR | Status: DC | PRN

## 2019-06-23 SURGICAL SUPPLY — 47 items
BANDAGE ACE 4X5 VEL STRL LF (GAUZE/BANDAGES/DRESSINGS) ×2 IMPLANT
BLADE MED AGGRESSIVE (BLADE) ×2 IMPLANT
BLADE OSC/SAGITTAL MD 5.5X18 (BLADE) ×2 IMPLANT
BLADE SURG 15 STRL LF DISP TIS (BLADE) ×2 IMPLANT
BLADE SURG 15 STRL SS (BLADE) ×2
BLADE SURG MINI STRL (BLADE) ×2 IMPLANT
BNDG COHESIVE 4X5 WHT NS (GAUZE/BANDAGES/DRESSINGS) ×1 IMPLANT
BNDG CONFORM 2 STRL LF (GAUZE/BANDAGES/DRESSINGS) ×2 IMPLANT
BNDG ESMARK 4X12 TAN STRL LF (GAUZE/BANDAGES/DRESSINGS) ×2 IMPLANT
BNDG GAUZE 4.5X4.1 6PLY STRL (MISCELLANEOUS) ×2 IMPLANT
CANISTER SUCT 1200ML W/VALVE (MISCELLANEOUS) ×2 IMPLANT
COVER WAND RF STERILE (DRAPES) ×2 IMPLANT
CUFF TOURN 18 STER (MISCELLANEOUS) ×2 IMPLANT
CUFF TOURN DUAL PL 12 NO SLV (MISCELLANEOUS) ×2 IMPLANT
DRAPE FLUOR MINI C-ARM 54X84 (DRAPES) ×1 IMPLANT
DURAPREP 26ML APPLICATOR (WOUND CARE) ×2 IMPLANT
ELECT REM PT RETURN 9FT ADLT (ELECTROSURGICAL) ×2
ELECTRODE REM PT RTRN 9FT ADLT (ELECTROSURGICAL) ×1 IMPLANT
GAUZE SPONGE 4X4 12PLY STRL (GAUZE/BANDAGES/DRESSINGS) ×2 IMPLANT
GAUZE XEROFORM 1X8 LF (GAUZE/BANDAGES/DRESSINGS) ×2 IMPLANT
GLOVE BIO SURGEON STRL SZ7.5 (GLOVE) ×4 IMPLANT
GLOVE INDICATOR 8.0 STRL GRN (GLOVE) ×2 IMPLANT
GOWN STRL REUS W/ TWL LRG LVL3 (GOWN DISPOSABLE) ×2 IMPLANT
GOWN STRL REUS W/TWL LRG LVL3 (GOWN DISPOSABLE) ×2
HANDPIECE VERSAJET DEBRIDEMENT (MISCELLANEOUS) ×1 IMPLANT
KIT TURNOVER KIT A (KITS) ×2 IMPLANT
LABEL OR SOLS (LABEL) ×2 IMPLANT
NDL FILTER BLUNT 18X1 1/2 (NEEDLE) ×1 IMPLANT
NDL HYPO 25X1 1.5 SAFETY (NEEDLE) ×2 IMPLANT
NEEDLE FILTER BLUNT 18X 1/2SAF (NEEDLE) ×1
NEEDLE FILTER BLUNT 18X1 1/2 (NEEDLE) ×1 IMPLANT
NEEDLE HYPO 25X1 1.5 SAFETY (NEEDLE) ×4 IMPLANT
NS IRRIG 500ML POUR BTL (IV SOLUTION) ×2 IMPLANT
PACK EXTREMITY ARMC (MISCELLANEOUS) ×2 IMPLANT
SOL .9 NS 3000ML IRR  AL (IV SOLUTION)
SOL .9 NS 3000ML IRR UROMATIC (IV SOLUTION) ×1 IMPLANT
SOL PREP PVP 2OZ (MISCELLANEOUS) ×2
SOLUTION PREP PVP 2OZ (MISCELLANEOUS) ×1 IMPLANT
STOCKINETTE STRL 6IN 960660 (GAUZE/BANDAGES/DRESSINGS) ×2 IMPLANT
STRIP CLOSURE SKIN 1/4X4 (GAUZE/BANDAGES/DRESSINGS) ×1 IMPLANT
SUT ETHILON 3-0 FS-10 30 BLK (SUTURE) ×2
SUT ETHILON 4-0 (SUTURE)
SUT ETHILON 4-0 FS2 18XMFL BLK (SUTURE)
SUTURE EHLN 3-0 FS-10 30 BLK (SUTURE) ×1 IMPLANT
SUTURE ETHLN 4-0 FS2 18XMF BLK (SUTURE) IMPLANT
SWAB DUAL CULTURE TRANS RED ST (MISCELLANEOUS) ×1 IMPLANT
SYR 10ML LL (SYRINGE) ×2 IMPLANT

## 2019-06-23 NOTE — Transfer of Care (Signed)
Immediate Anesthesia Transfer of Care Note  Patient: Justin Landry  Procedure(s) Performed: AMPUTATION TOE LEFT AMPUTATION (Left )  Patient Location: PACU  Anesthesia Type:MAC  Level of Consciousness: awake, alert  and oriented  Airway & Oxygen Therapy: Patient Spontanous Breathing  Post-op Assessment: Report given to RN  Post vital signs: Reviewed and stable  Last Vitals:  Vitals Value Taken Time  BP    Temp    Pulse 70 06/23/19 0856  Resp 17 06/23/19 0856  SpO2 100 % 06/23/19 0856  Vitals shown include unvalidated device data.  Last Pain:  Vitals:   06/23/19 0730  TempSrc:   PainSc: 3          Complications: No apparent anesthesia complications

## 2019-06-23 NOTE — Progress Notes (Signed)
[facility] NAME: Justin Landry    MR#:  [id number]  DATE OF BIRTH:  11/07/1961  SUBJECTIVE:  CHIEF COMPLAINT:   Chief Complaint  Patient presents with  . toe infection  Patient seen and evaluated today Left great toe has been amputated Tolerated procedure well Has pain in the left foot No fever No shortness of breath  REVIEW OF SYSTEMS:    ROS  CONSTITUTIONAL: No documented fever. No fatigue, weakness. No weight gain, no weight loss.  EYES: No blurry or double vision.  ENT: No tinnitus. No postnasal drip. No redness of the oropharynx.  RESPIRATORY: No cough, no wheeze, no hemoptysis. No dyspnea.  CARDIOVASCULAR: No chest pain. No orthopnea. No palpitations. No syncope.  GASTROINTESTINAL: No nausea, no vomiting or diarrhea. No abdominal pain. No melena or hematochezia.  GENITOURINARY: No dysuria or hematuria.  ENDOCRINE: No polyuria or nocturia. No heat or cold intolerance.  HEMATOLOGY: No anemia. No bruising. No bleeding.  INTEGUMENTARY: No rashes. No lesions.  MUSCULOSKELETAL: No arthritis.  Left foot swelling. No gout.  Left foot pain. NEUROLOGIC: No numbness, tingling, or ataxia. No seizure-type activity.  PSYCHIATRIC: No anxiety. No insomnia. No ADD.   DRUG ALLERGIES:   Allergies  Allergen Reactions  . Morphine And Related Nausea And Vomiting and Other (See Comments)    Extreme sweating   . Oxycontin [Oxycodone Hcl] Other (See Comments)    Extreme NAusea and vomitting follows (Patient Tolerates Percocet short acting)  . Penicillins Nausea And Vomiting    Has patient had a PCN reaction causing immediate rash, facial/tongue/throat swelling, SOB or lightheadedness with hypotension: No Has patient had a PCN reaction causing severe rash involving mucus membranes or skin necrosis: No Has patient had a PCN reaction that required hospitalization No Has patient had a PCN reaction occurring within the last 10 years:  No If all of the above answers are "NO", then may proceed with Cephalosporin use.     VITALS:  Blood pressure (!) 150/90, pulse 73, temperature (!) 97.4 F (36.3 C), resp. rate 15, height 5\' 7"  (1.702 m), weight 92.5 kg, SpO2 100 %.  PHYSICAL EXAMINATION:   Physical Exam  GENERAL:  58 y.o.-year-old patient lying in the bed with no acute distress.  EYES: Pupils equal, round, reactive to light and accommodation. No scleral icterus. Extraocular muscles intact.  HEENT: Head atraumatic, normocephalic. Oropharynx and nasopharynx clear.  NECK:  Supple, no jugular venous distention. No thyroid enlargement, no tenderness.  LUNGS: Normal breath sounds bilaterally, no wheezing, rales, rhonchi. No use of accessory muscles of respiration.  CARDIOVASCULAR: S1, S2 normal. No murmurs, rubs, or gallops.  ABDOMEN: Soft, nontender, nondistended. Bowel sounds present. No organomegaly or mass.  EXTREMITIES: No cyanosis, clubbing  Left foot bandage noted Left great toe amputated NEUROLOGIC: Cranial nerves II through XII are intact. No focal Motor or sensory deficits b/l.   PSYCHIATRIC: The patient is alert and oriented x 3.  SKIN: Redness of skin of left foot       LABORATORY PANEL:   CBC Recent Labs  Lab 06/23/19 0432  WBC 11.2*  HGB 17.0  HCT 49.7  PLT 301   ------------------------------------------------------------------------------------------------------------------ Chemistries  Recent Labs  Lab 06/21/19 1843 06/23/19 0432  NA 134* 138  K 3.6 5.1  CL 101 99  CO2 23 30  GLUCOSE 230* 208*  BUN 15 12  CREATININE 0.72 0.72  CALCIUM 9.4 9.8  AST 26  --   ALT 30  --  ALKPHOS 75  --   BILITOT 0.5  --    ------------------------------------------------------------------------------------------------------------------  Cardiac Enzymes No results for input(s): TROPONINI in the last 168 hours.  ------------------------------------------------------------------------------------------------------------------  RADIOLOGY:  Dg Foot Complete Left  Result Date: 06/22/2019 CLINICAL DATA:  Redness and swelling of the left great toe for 7-10 days. EXAM: LEFT FOOT - COMPLETE 3+ VIEW COMPARISON:  None. FINDINGS: Bony destructive change is seen in the distal 1 cm of the distal phalanx of the great toe consistent with osteomyelitis. No other bony destructive change is identified. Soft tissues are swollen. No soft tissue gas or radiopaque foreign body. Plantar calcaneal spur noted. IMPRESSION: Normal findings consistent osteomyelitis in the distal 1 cm of the distal phalanx of the great toe. Electronically Signed   By: Drusilla Kannerhomas  Dalessio M.D.   On: 06/22/2019 14:28     ASSESSMENT AND PLAN:  58 year old male patient with a known history of type 2 diabetes mellitus, hyperlipidemia, hypertension, inguinal hernia presented to the emergency room for left great toe pain with swelling of the left foot and redness and swelling around the left great toe.  -Left foot cellulitis Improving Start patient on IV vancomycin and clindamycin antibiotic Follow-up wound cultures and lactic acid levels  -Left big toe osteomyelitis Status post podiatry evaluation Left big toe has been amputated this morning Postop follow-up and care by podiatry  -Left big toe ingrown toenail Podiatry consult appreciated Left big toe has been amputated  -Uncontrolled hypertension Could be from the pain in the left foot Pain management and start oral hydralazine ACE inhibitor on hold secondary to hyperkalemia  -Acute hyperkalemia Hold ACE inhibitor for now Monitor potassium levels  -Type 2 diabetes mellitus Diabetic diet with sliding scale coverage with insulin  -DVT prophylaxis with subcu Lovenox daily  -Tobacco abuse Tobacco cessation counseled to the patient for 6 minutes Nicotine patch offered  -COVID-19  test is negative   All the records are reviewed and case discussed with Care Management/Social Worker. Management plans discussed with the patient, family and they are in agreement.  CODE STATUS: Full code  DVT Prophylaxis: SCDs  TOTAL TIME TAKING CARE OF THIS PATIENT: 38 minutes.   POSSIBLE D/C IN 1 to 2 DAYS, DEPENDING ON CLINICAL CONDITION.  Ihor AustinPavan Jailynne Opperman M.D on 06/23/2019 at 11:16 AM  Between 7am to 6pm - Pager - [phone]  After 6pm go to [email or web address] - password [email or web address] [facility]  [facility]  Office  [phone]  CC: Primary care physician; Lorre MunroeBaity, Regina W, NP  Note: This dictation was prepared with Dragon dictation along with smaller phrase technology. Any transcriptional errors that result from this process are unintentional.

## 2019-06-23 NOTE — Anesthesia Postprocedure Evaluation (Signed)
Anesthesia Post Note  Patient: Demari Gales  Procedure(s) Performed: AMPUTATION TOE LEFT AMPUTATION (Left )  Patient location during evaluation: PACU Anesthesia Type: General Level of consciousness: awake and alert Pain management: pain level controlled Vital Signs Assessment: post-procedure vital signs reviewed and stable Respiratory status: spontaneous breathing, nonlabored ventilation, respiratory function stable and patient connected to nasal cannula oxygen Cardiovascular status: blood pressure returned to baseline and stable Postop Assessment: no apparent nausea or vomiting Anesthetic complications: no     Last Vitals:  Vitals:   06/23/19 0928 06/23/19 0956  BP: (!) 149/93 (!) 150/90  Pulse: 84 73  Resp: 15   Temp:    SpO2: 100%     Last Pain:  Vitals:   06/23/19 1239  TempSrc:   PainSc: 5                  Martha Clan

## 2019-06-23 NOTE — Op Note (Signed)
Date of operation: 06/23/2019.  Surgeon: Durward Fortes D.P.M.  Preoperative diagnosis: Osteomyelitis left great toe.  Postoperative diagnosis: Same.  Procedure: Amputation left great toe.  Anesthesia: Local MAC.  Hemostasis: Pneumatic tourniquet left ankle 250 mmHg.  Estimated blood loss: Less than 5 cc.  Cultures: Bone culture distal phalanx left great toe.  Pathology: Left great toe.  Complications: None apparent.  Operative indications: This is a 58 year old male with recent development of an ingrown toenail on his left great toe.  Was admitted for infection and upon I&D noted to have osteomyelitis and decision was made for amputation of the left great toe.  Operative procedure: Patient was taken to the operating room and placed on the table in the supine position.  Following satisfactory sedation the left great toe and forefoot around the first metatarsal area was anesthetized using 10 cc of 0.5% Marcaine plain.  A pneumatic tourniquet was applied at the level of the left ankle and the foot was prepped and draped in the usual sterile fashion.  The foot was exsanguinated and the tourniquet inflated to 250 mmHg.      Attention was then directed to the base of the left great toe where a fishmouth type incision was made coursing medial to lateral around the base of the great toe.  The incision was deepened sharply down to the level of the bone and dissection carried back proximal to the inner phalangeal joint.  The proximal phalanx was then transected behind the head and the entire distal aspect of the toe was delivered from the wound.  Good healthy wound edges were noted and the wound was flushed with copious amounts of sterile saline and closed using 3-0 nylon simple interrupted sutures.  Xeroform 4 x 4's and con form applied to the left foot.  Tourniquet was released.  Kerlix and Ace wrap applied for compression.  Patient tolerated the procedure and anesthesia well and was transported to  the PACU with vital signs stable and in good condition.

## 2019-06-23 NOTE — Interval H&P Note (Signed)
History and Physical Interval Note:  06/23/2019 8:07 AM  Justin Landry  has presented today for surgery, with the diagnosis of osteomyelitis left great toe.  The various methods of treatment have been discussed with the patient and family. After consideration of risks, benefits and other options for treatment, the patient has consented to  Procedure(s): AMPUTATION TOE LEFT AMPUTATION (Left) as a surgical intervention.  The patient's history has been reviewed, patient examined, no change in status, stable for surgery.  I have reviewed the patient's chart and labs.  Questions were answered to the patient's satisfaction.     Durward Fortes

## 2019-06-23 NOTE — Anesthesia Preprocedure Evaluation (Addendum)
Anesthesia Evaluation  Patient identified by MRN, date of birth, ID band Patient awake    Reviewed: Allergy & Precautions, H&P , NPO status , Patient's Chart, lab work & pertinent test results, reviewed documented beta blocker date and time   History of Anesthesia Complications Negative for: history of anesthetic complications  Airway Mallampati: III  TM Distance: >3 FB Neck ROM: full    Dental  (+) Dental Advidsory Given, Missing, Poor Dentition   Pulmonary neg shortness of breath, neg COPD, neg recent URI, Current Smoker,           Cardiovascular Exercise Tolerance: Good hypertension, (-) angina(-) Past MI (-) dysrhythmias (-) Valvular Problems/Murmurs     Neuro/Psych negative neurological ROS  negative psych ROS   GI/Hepatic negative GI ROS, Neg liver ROS,   Endo/Other  diabetes  Renal/GU negative Renal ROS  negative genitourinary   Musculoskeletal   Abdominal   Peds  Hematology negative hematology ROS (+)   Anesthesia Other Findings Past Medical History: No date: Diabetes mellitus No date: High cholesterol No date: Humerus fracture     Comment:  right No date: Hypertension No date: Inguinal hernia   Reproductive/Obstetrics negative OB ROS                             Anesthesia Physical Anesthesia Plan  ASA: III  Anesthesia Plan: General   Post-op Pain Management:    Induction: Intravenous  PONV Risk Score and Plan: 1 and Treatment may vary due to age or medical condition, Propofol infusion and TIVA  Airway Management Planned: Natural Airway and Simple Face Mask  Additional Equipment:   Intra-op Plan:   Post-operative Plan: Extubation in OR  Informed Consent: I have reviewed the patients History and Physical, chart, labs and discussed the procedure including the risks, benefits and alternatives for the proposed anesthesia with the patient or authorized representative  who has indicated his/her understanding and acceptance.     Dental Advisory Given  Plan Discussed with: Anesthesiologist, CRNA and Surgeon  Anesthesia Plan Comments:        Anesthesia Quick Evaluation

## 2019-06-23 NOTE — Anesthesia Post-op Follow-up Note (Signed)
Anesthesia QCDR form completed.        

## 2019-06-24 ENCOUNTER — Encounter: Payer: Self-pay | Admitting: Podiatry

## 2019-06-24 LAB — CBC WITH DIFFERENTIAL/PLATELET
Abs Immature Granulocytes: 0.02 10*3/uL (ref 0.00–0.07)
Basophils Absolute: 0.1 10*3/uL (ref 0.0–0.1)
Basophils Relative: 1 %
Eosinophils Absolute: 0.3 10*3/uL (ref 0.0–0.5)
Eosinophils Relative: 3 %
HCT: 46.1 % (ref 39.0–52.0)
Hemoglobin: 16.1 g/dL (ref 13.0–17.0)
Immature Granulocytes: 0 %
Lymphocytes Relative: 30 %
Lymphs Abs: 2.9 10*3/uL (ref 0.7–4.0)
MCH: 30.4 pg (ref 26.0–34.0)
MCHC: 34.9 g/dL (ref 30.0–36.0)
MCV: 87.1 fL (ref 80.0–100.0)
Monocytes Absolute: 0.9 10*3/uL (ref 0.1–1.0)
Monocytes Relative: 9 %
Neutro Abs: 5.6 10*3/uL (ref 1.7–7.7)
Neutrophils Relative %: 57 %
Platelets: 285 10*3/uL (ref 150–400)
RBC: 5.29 MIL/uL (ref 4.22–5.81)
RDW: 12 % (ref 11.5–15.5)
WBC: 9.9 10*3/uL (ref 4.0–10.5)
nRBC: 0 % (ref 0.0–0.2)

## 2019-06-24 LAB — GLUCOSE, CAPILLARY
Glucose-Capillary: 207 mg/dL — ABNORMAL HIGH (ref 70–99)
Glucose-Capillary: 176 mg/dL — ABNORMAL HIGH (ref 70–99)

## 2019-06-24 LAB — CREATININE, SERUM
Creatinine, Ser: 0.65 mg/dL (ref 0.61–1.24)
GFR calc Af Amer: >60 mL/min (ref >60)
GFR calc non Af Amer: >60 mL/min (ref >60)

## 2019-06-24 LAB — POTASSIUM: Potassium: 3.8 mmol/L (ref 3.5–5.1)

## 2019-06-24 MED ORDER — NICOTINE 21 MG/24HR TD PT24: 21.0000 mg | MEDICATED_PATCH | Freq: Every day | TRANSDERMAL | 0 refills | Status: DC

## 2019-06-24 MED ORDER — HYDROCODONE-ACETAMINOPHEN 7.5-325 MG PO TABS: 1.0000 | ORAL_TABLET | Freq: Four times a day (QID) | ORAL | 0 refills | Status: DC | PRN

## 2019-06-24 MED ORDER — SULFAMETHOXAZOLE-TRIMETHOPRIM 800-160 MG PO TABS: 1.0000 | ORAL_TABLET | Freq: Two times a day (BID) | ORAL | 0 refills | Status: AC

## 2019-06-24 NOTE — Progress Notes (Signed)
1 Day Post-Op   Subjective/Chief Complaint: Patient seen.  No complaints other than he is ready to go home at this point.   Objective: Vital signs in last 24 hours: Temp:  [97.7 F (36.5 C)-97.9 F (36.6 C)] 97.8 F (36.6 C) (07/12 0543) Pulse Rate:  [70-81] 70 (07/12 0543) Resp:  [18] 18 (07/11 1616) BP: (145-173)/(85-96) 157/91 (07/12 0543) SpO2:  [97 %-99 %] 97 % (07/12 0543) Last BM Date: 06/22/19  Intake/Output from previous day: 07/11 0701 - 07/12 0700 In: 1229.5 [P.O.:50; I.V.:731.2; IV Piggyback:448.3] Out: 2575 [Urine:2575] Intake/Output this shift: Total I/O In: -  Out: 425 [Urine:425]  Bandage on the left foot is dry and intact.  Lab Results:  Recent Labs    06/23/19 0432 06/24/19 0449  WBC 11.2* 9.9  HGB 17.0 16.1  HCT 49.7 46.1  PLT 301 285   BMET Recent Labs    06/21/19 1843 06/23/19 0432 06/24/19 0449  NA 134* 138  --   K 3.6 5.1 3.8  CL 101 99  --   CO2 23 30  --   GLUCOSE 230* 208*  --   BUN 15 12  --   CREATININE 0.72 0.72 0.65  CALCIUM 9.4 9.8  --    PT/INR No results for input(s): LABPROT, INR in the last 72 hours. ABG No results for input(s): PHART, HCO3 in the last 72 hours.  Invalid input(s): PCO2, PO2  Studies/Results: Dg Foot Complete Left  Result Date: 06/22/2019 CLINICAL DATA:  Redness and swelling of the left great toe for 7-10 days. EXAM: LEFT FOOT - COMPLETE 3+ VIEW COMPARISON:  None. FINDINGS: Bony destructive change is seen in the distal 1 cm of the distal phalanx of the great toe consistent with osteomyelitis. No other bony destructive change is identified. Soft tissues are swollen. No soft tissue gas or radiopaque foreign body. Plantar calcaneal spur noted. IMPRESSION: Normal findings consistent osteomyelitis in the distal 1 cm of the distal phalanx of the great toe. Electronically Signed   By: Inge Rise M.D.   On: 06/22/2019 14:28    Anti-infectives: Anti-infectives (From admission, onward)   Start      Dose/Rate Route Frequency Ordered Stop   06/24/19 0000  sulfamethoxazole-trimethoprim (BACTRIM DS) 800-160 MG tablet     1 tablet Oral 2 times daily 06/24/19 1013 07/04/19 2359   06/22/19 1400  clindamycin (CLEOCIN) IVPB 600 mg     600 mg 100 mL/hr over 30 Minutes Intravenous Every 8 hours 06/22/19 1000     06/22/19 1400  vancomycin (VANCOCIN) 1,250 mg in sodium chloride 0.9 % 250 mL IVPB  Status:  Discontinued     1,250 mg 166.7 mL/hr over 90 Minutes Intravenous Every 12 hours 06/22/19 1114 06/22/19 1127   06/22/19 1400  vancomycin (VANCOCIN) 1,250 mg in sodium chloride 0.9 % 250 mL IVPB     1,250 mg 166.7 mL/hr over 90 Minutes Intravenous Every 12 hours 06/22/19 1127     06/22/19 0845  piperacillin-tazobactam (ZOSYN) IVPB 3.375 g     3.375 g 100 mL/hr over 30 Minutes Intravenous  Once 06/22/19 0844 06/22/19 1013   06/22/19 0845  vancomycin (VANCOCIN) IVPB 1000 mg/200 mL premix     1,000 mg 200 mL/hr over 60 Minutes Intravenous  Once 06/22/19 0844 06/22/19 1119      Assessment/Plan: s/p Procedure(s): AMPUTATION TOE LEFT AMPUTATION (Left) Assessment: Status post amputation left hallux.   Plan: Dressing left intact.  Messaged Dr. Estanislado Pandy and patient should be stable for discharge  on Bactrim.  Instructed to keep the bandage clean, dry, and do not remove.  Instructed on weightbearing with only pressure on the heel.  [facility]  LOS: 2 days    Ricci Barkerodd W Nollie Terlizzi 06/24/2019

## 2019-06-24 NOTE — Plan of Care (Signed)
  Problem: Education: Goal: Knowledge of General Education information will improve Description: Including pain rating scale, medication(s)/side effects and non-pharmacologic comfort measures Outcome: Adequate for Discharge   Problem: Health Behavior/Discharge Planning: Goal: Ability to manage health-related needs will improve Outcome: Adequate for Discharge   Problem: Clinical Measurements: Goal: Ability to maintain clinical measurements within normal limits will improve Outcome: Adequate for Discharge Goal: Will remain free from infection Outcome: Adequate for Discharge Goal: Diagnostic test results will improve Outcome: Adequate for Discharge Goal: Respiratory complications will improve Outcome: Adequate for Discharge Goal: Cardiovascular complication will be avoided Outcome: Adequate for Discharge   Problem: Pain Managment: Goal: General experience of comfort will improve Outcome: Adequate for Discharge   Problem: Safety: Goal: Ability to remain free from injury will improve Outcome: Adequate for Discharge   Problem: Skin Integrity: Goal: Risk for impaired skin integrity will decrease Outcome: Adequate for Discharge   

## 2019-06-24 NOTE — Discharge Summary (Signed)
[facility] - [place] [facility]   PATIENT NAME: Justin Landry    MR#:  [id number]  DATE OF BIRTH:  06-11-1961  DATE OF ADMISSION:  06/22/2019 ADMITTING PHYSICIAN: Ihor AustinPavan Pyreddy, MD  DATE OF DISCHARGE: 06/24/2019  PRIMARY CARE PHYSICIAN: Lorre MunroeBaity, Regina W, NP   ADMISSION DIAGNOSIS:  Cellulitis of right lower extremity [L03.115] Uncontrolled hypertension DISCHARGE DIAGNOSIS:  Cellulitis of the left foot Left big toe osteomyelitis Uncontrolled hypertension Hyperkalemia Type 2 diabetes mellitus Tobacco abuse SECONDARY DIAGNOSIS:   Past Medical History:  Diagnosis Date  . Diabetes mellitus   . High cholesterol   . Humerus fracture    right  . Hypertension   . Inguinal hernia      ADMITTING HISTORY Justin Landry  is a 58 y.o. male with a known history of type 2 diabetes mellitus, hyperlipidemia, hypertension, inguinal hernia presented to the emergency room for left great toe pain with swelling of the left foot and redness and swelling around the left great toe.  Patient tried to pull out the ingrown toenail in the left toe.  He noticed swelling of the left foot and redness along with tenderness for the last couple of days.  Presented to the emergency room with cellulitis of the left foot.  Patient was started on IV antibiotics and hospitalist service was consulted.  COVID-19 test negative.  HOSPITAL COURSE:  Patient was admitted to medical floor started on IV vancomycin and clindamycin antibiotics.  Podiatry consultation was done.  Patient's left foot was evaluated with podiatry and patient had amputation of the left big toe for osteomyelitis.  Patient tolerated procedure well and IV antibiotics well.  His blood pressure was better controlled in the hospital.  Dressings were done by podiatry post surgery.  COVID-19 test was negative.  Patient's left foot redness and swelling improved.  His leukocytosis resolved.  Hyperkalemia also resolved.  Wound cultures  were reviewed with podiatry who recommended patient can be discharged home on oral Bactrim antibiotic.  CONSULTS OBTAINED:  Treatment Team:  Linus Galasline, Todd, DPM  DRUG ALLERGIES:   Allergies  Allergen Reactions  . Morphine And Related Nausea And Vomiting and Other (See Comments)    Extreme sweating   . Oxycontin [Oxycodone Hcl] Other (See Comments)    Extreme NAusea and vomitting follows (Patient Tolerates Percocet short acting)  . Penicillins Nausea And Vomiting    Has patient had a PCN reaction causing immediate rash, facial/tongue/throat swelling, SOB or lightheadedness with hypotension: No Has patient had a PCN reaction causing severe rash involving mucus membranes or skin necrosis: No Has patient had a PCN reaction that required hospitalization No Has patient had a PCN reaction occurring within the last 10 years: No If all of the above answers are "NO", then may proceed with Cephalosporin use.     DISCHARGE MEDICATIONS:   Allergies as of 06/24/2019      Reactions   Morphine And Related Nausea And Vomiting, Other (See Comments)   Extreme sweating    Oxycontin [oxycodone Hcl] Other (See Comments)   Extreme NAusea and vomitting follows (Patient Tolerates Percocet short acting)   Penicillins Nausea And Vomiting   Has patient had a PCN reaction causing immediate rash, facial/tongue/throat swelling, SOB or lightheadedness with hypotension: No Has patient had a PCN reaction causing severe rash involving mucus membranes or skin necrosis: No Has patient had a PCN reaction that required hospitalization No Has patient had a PCN reaction occurring within the last 10 years: No If all of the  above answers are "NO", then may proceed with Cephalosporin use.      Medication List    TAKE these medications   Fish Oil + D3 1000-1000 MG-UNIT Caps Take 1 capsule by mouth daily with breakfast.   GLUCOSAMINE 1500 COMPLEX PO Take 1 tablet by mouth daily.   HYDROcodone-acetaminophen 7.5-325 MG  tablet Commonly known as: NORCO Take 1 tablet by mouth every 6 (six) hours as needed for moderate pain or severe pain.   lisinopril 40 MG tablet Commonly known as: ZESTRIL Take 40 mg by mouth daily.   metFORMIN 500 MG tablet Commonly known as: GLUCOPHAGE Take 500 mg by mouth daily.   nicotine 21 mg/24hr patch Commonly known as: NICODERM CQ - dosed in mg/24 hours Place 1 patch (21 mg total) onto the skin daily. Start taking on: June 25, 2019   sulfamethoxazole-trimethoprim 800-160 MG tablet Commonly known as: BACTRIM DS Take 1 tablet by mouth 2 (two) times daily for 10 days.   VITAMIN B 12 PO Take 1 tablet by mouth daily.       Today  Patient seen and evaluated today Tolerating antibiotics well Hemodynamically stable Tobacco cessation counseled to the patient for 6 minutes VITAL SIGNS:  Blood pressure (!) 157/91, pulse 70, temperature 97.8 F (36.6 C), temperature source Oral, resp. rate 18, height 5\' 7"  (1.702 m), weight 92.5 kg, SpO2 97 %.  I/O:    Intake/Output Summary (Last 24 hours) at 06/24/2019 1101 Last data filed at 06/24/2019 0857 Gross per 24 hour  Intake 98.27 ml  Output 3000 ml  Net -2901.73 ml    PHYSICAL EXAMINATION:  Physical Exam  GENERAL:  58 y.o.-year-old patient lying in the bed with no acute distress.  LUNGS: Normal breath sounds bilaterally, no wheezing, rales,rhonchi or crepitation. No use of accessory muscles of respiration.  CARDIOVASCULAR: S1, S2 normal. No murmurs, rubs, or gallops.  ABDOMEN: Soft, non-tender, non-distended. Bowel sounds present. No organomegaly or mass.  NEUROLOGIC: Moves all 4 extremities. PSYCHIATRIC: The patient is alert and oriented x 3.  SKIN: No obvious rash, lesion, or ulcer.   DATA REVIEW:   CBC Recent Labs  Lab 06/24/19 0449  WBC 9.9  HGB 16.1  HCT 46.1  PLT 285    Chemistries  Recent Labs  Lab 06/21/19 1843 06/23/19 0432 06/24/19 0449  NA 134* 138  --   K 3.6 5.1 3.8  CL 101 99  --   CO2  23 30  --   GLUCOSE 230* 208*  --   BUN 15 12  --   CREATININE 0.72 0.72 0.65  CALCIUM 9.4 9.8  --   AST 26  --   --   ALT 30  --   --   ALKPHOS 75  --   --   BILITOT 0.5  --   --     Cardiac Enzymes No results for input(s): TROPONINI in the last 168 hours.  Microbiology Results  Results for orders placed or performed during the hospital encounter of 06/22/19  SARS Coronavirus 2 (CEPHEID - Performed in [facility] lab), Hosp Order     Status: None   Collection Time: 06/22/19  9:06 AM   Specimen: Nasopharyngeal Swab  Result Value Ref Range Status   SARS Coronavirus 2 NEGATIVE NEGATIVE Final    Comment: (NOTE) If result is NEGATIVE SARS-CoV-2 target nucleic acids are NOT DETECTED. The SARS-CoV-2 RNA is generally detectable in upper and lower  respiratory specimens during the acute phase of infection. The  lowest  concentration of SARS-CoV-2 viral copies this assay can detect is 250  copies / mL. A negative result does not preclude SARS-CoV-2 infection  and should not be used as the sole basis for treatment or other  patient management decisions.  A negative result may occur with  improper specimen collection / handling, submission of specimen other  than nasopharyngeal swab, presence of viral mutation(s) within the  areas targeted by this assay, and inadequate number of viral copies  (<250 copies / mL). A negative result must be combined with clinical  observations, patient history, and epidemiological information. If result is POSITIVE SARS-CoV-2 target nucleic acids are DETECTED. The SARS-CoV-2 RNA is generally detectable in upper and lower  respiratory specimens dur ing the acute phase of infection.  Positive  results are indicative of active infection with SARS-CoV-2.  Clinical  correlation with patient history and other diagnostic information is  necessary to determine patient infection status.  Positive results do  not rule out bacterial infection or  co-infection with other viruses. If result is PRESUMPTIVE POSTIVE SARS-CoV-2 nucleic acids MAY BE PRESENT.   A presumptive positive result was obtained on the submitted specimen  and confirmed on repeat testing.  While 2019 novel coronavirus  (SARS-CoV-2) nucleic acids may be present in the submitted sample  additional confirmatory testing may be necessary for epidemiological  and / or clinical management purposes  to differentiate between  SARS-CoV-2 and other Sarbecovirus currently known to infect humans.  If clinically indicated additional testing with an alternate test  methodology [phone]) is advised. The SARS-CoV-2 RNA is generally  detectable in upper and lower respiratory sp ecimens during the acute  phase of infection. The expected result is Negative. Fact Sheet for Patients:  StrictlyIdeas.no Fact Sheet for Healthcare Providers: BankingDealers.co.za This test is not yet approved or cleared by the [place] FDA and has been authorized for detection and/or diagnosis of SARS-CoV-2 by FDA under an Emergency Use Authorization (EUA).  This EUA will remain in effect (meaning this test can be used) for the duration of the COVID-19 declaration under Section 564(b)(1) of the Act, 21 U.S.C. section 360bbb-3(b)(1), unless the authorization is terminated or revoked sooner. Performed at [facility], [address] [place], [place] [address]   Aerobic/Anaerobic Culture (surgical/deep wound)     Status: None (Preliminary result)   Collection Time: 06/22/19 12:38 PM   Specimen: Wound; Abscess  Result Value Ref Range Status   Specimen Description WOUND LEFT TOE  Final   Special Requests NONE  Final   Gram Stain   Final    MODERATE WBC PRESENT, PREDOMINANTLY PMN MODERATE GRAM POSITIVE COCCI MODERATE GRAM NEGATIVE RODS    Culture   Final    CULTURE REINCUBATED FOR BETTER GROWTH Performed at [facility], [place]  [address], [place], [place] [address]    Report Status PENDING  Incomplete  Aerobic/Anaerobic Culture (surgical/deep wound)     Status: None (Preliminary result)   Collection Time: 06/23/19  8:28 AM   Specimen: [facility] Bone resection; Tissue  Result Value Ref Range Status   Specimen Description BONE LEFT TOE GREAT  Final   Special Requests PATIENT ON FOLLOWING VANC ZOSYN CLEOCIN  Final   Gram Stain   Final    FEW WBC PRESENT, PREDOMINANTLY PMN RARE GRAM POSITIVE COCCI Performed at [facility], [place] [address] [place], [place] [address]    Culture PENDING  Incomplete   Report Status PENDING  Incomplete  RADIOLOGY:  Dg Foot Complete Left  Result Date: 06/22/2019 CLINICAL DATA:  Redness and swelling of the left great toe for 7-10 days. EXAM: LEFT FOOT - COMPLETE 3+ VIEW COMPARISON:  None. FINDINGS: Bony destructive change is seen in the distal 1 cm of the distal phalanx of the great toe consistent with osteomyelitis. No other bony destructive change is identified. Soft tissues are swollen. No soft tissue gas or radiopaque foreign body. Plantar calcaneal spur noted. IMPRESSION: Normal findings consistent osteomyelitis in the distal 1 cm of the distal phalanx of the great toe. Electronically Signed   By: Drusilla Kannerhomas  Dalessio M.D.   On: 06/22/2019 14:28    Follow up with PCP in 1 week.  Management plans discussed with the patient, family and they are in agreement.  CODE STATUS: Full code    Code Status Orders  (From admission, onward)         Start     Ordered   06/22/19 1012  Full code  Continuous     06/22/19 1011        Code Status History    This patient has a current code status but no historical code status.   Advance Care Planning Activity      TOTAL TIME TAKING CARE OF THIS PATIENT ON DAY OF DISCHARGE: more than 35 minutes.   Ihor AustinPavan Pyreddy M.D on 06/24/2019 at 11:01 AM  Between 7am to 6pm - Pager - [phone]  After 6pm go to [email or web address] - password [email or web address]  [facility]  [facility]  Office  [phone]  CC: Primary care physician; Lorre MunroeBaity, Regina W, NP  Note: This dictation was prepared with Dragon dictation along with smaller phrase technology. Any transcriptional errors that result from this process are unintentional.

## 2019-06-26 ENCOUNTER — Telehealth: Payer: Self-pay

## 2019-06-26 NOTE — Telephone Encounter (Signed)
Yes, he has not been seen in 3 years. He is technically no longer a patient of mine. Please remove me as his PCP in Epic.

## 2019-06-26 NOTE — Telephone Encounter (Signed)
I do not see where patient has seen Webb Silversmith, NP since 2016. Appears he has been frequenting the ER and was seen by [place] in 2018.  I called patient to verify his care but no answer and could not leave a message as voice mail not set up.  Forwarding to Webb Silversmith, NP as Juluis Rainier as I do not think he is our patient and her name may need to be removed as PCP but will defer to NP for guidance.  Thanks.

## 2019-06-27 LAB — SURGICAL PATHOLOGY

## 2019-06-27 LAB — AEROBIC/ANAEROBIC CULTURE W GRAM STAIN (SURGICAL/DEEP WOUND)

## 2019-06-27 NOTE — Telephone Encounter (Signed)
Justin Landry,  Can you please have Rollene Fare removed as patient's PCP in EPIC?

## 2019-06-28 LAB — AEROBIC/ANAEROBIC CULTURE W GRAM STAIN (SURGICAL/DEEP WOUND)

## 2019-08-11 ENCOUNTER — Other Ambulatory Visit: Payer: Self-pay

## 2019-08-11 ENCOUNTER — Encounter: Payer: Self-pay | Admitting: Emergency Medicine

## 2019-08-11 ENCOUNTER — Emergency Department: Payer: Self-pay

## 2019-08-11 ENCOUNTER — Emergency Department
Admission: EM | Admit: 2019-08-11 | Discharge: 2019-08-11 | Disposition: A | Discharge status: Home / Self Care | Payer: Self-pay | Attending: Emergency Medicine | Admitting: Emergency Medicine

## 2019-08-11 DIAGNOSIS — Y93H2 Activity, gardening and landscaping: Secondary | ICD-10-CM | POA: Insufficient documentation

## 2019-08-11 DIAGNOSIS — I10 Essential (primary) hypertension: Secondary | ICD-10-CM | POA: Insufficient documentation

## 2019-08-11 DIAGNOSIS — Y999 Unspecified external cause status: Secondary | ICD-10-CM | POA: Insufficient documentation

## 2019-08-11 DIAGNOSIS — X509XXA Other and unspecified overexertion or strenuous movements or postures, initial encounter: Secondary | ICD-10-CM | POA: Insufficient documentation

## 2019-08-11 DIAGNOSIS — E119 Type 2 diabetes mellitus without complications: Secondary | ICD-10-CM | POA: Insufficient documentation

## 2019-08-11 DIAGNOSIS — F17210 Nicotine dependence, cigarettes, uncomplicated: Secondary | ICD-10-CM | POA: Insufficient documentation

## 2019-08-11 DIAGNOSIS — S46002A Unspecified injury of muscle(s) and tendon(s) of the rotator cuff of left shoulder, initial encounter: Secondary | ICD-10-CM | POA: Insufficient documentation

## 2019-08-11 DIAGNOSIS — Y92017 Garden or yard in single-family (private) house as the place of occurrence of the external cause: Secondary | ICD-10-CM | POA: Insufficient documentation

## 2019-08-11 DIAGNOSIS — S46002D Unspecified injury of muscle(s) and tendon(s) of the rotator cuff of left shoulder, subsequent encounter: Secondary | ICD-10-CM

## 2019-08-11 DIAGNOSIS — Z760 Encounter for issue of repeat prescription: Secondary | ICD-10-CM | POA: Insufficient documentation

## 2019-08-11 DIAGNOSIS — Z7984 Long term (current) use of oral hypoglycemic drugs: Secondary | ICD-10-CM | POA: Insufficient documentation

## 2019-08-11 MED ORDER — CLONIDINE HCL 0.1 MG PO TABS
0.2000 mg | ORAL_TABLET | Freq: Once | ORAL | Status: AC
  Administered 2019-08-11: 0.2 mg via ORAL
  Filled 2019-08-11: qty 2

## 2019-08-11 MED ORDER — LISINOPRIL 40 MG PO TABS: 40.0000 mg | ORAL_TABLET | Freq: Every day | ORAL | 6 refills | Status: DC

## 2019-08-11 MED ORDER — TRAMADOL HCL 50 MG PO TABS: 50.0000 mg | ORAL_TABLET | Freq: Four times a day (QID) | ORAL | 0 refills | Status: DC | PRN

## 2019-08-11 MED ORDER — TRAMADOL HCL 50 MG PO TABS
50.0000 mg | ORAL_TABLET | Freq: Once | ORAL | Status: AC
  Administered 2019-08-11: 50 mg via ORAL
  Filled 2019-08-11: qty 1

## 2019-08-11 MED ORDER — LIDOCAINE 5 % EX PTCH
1.0000 | MEDICATED_PATCH | CUTANEOUS | Status: DC
  Administered 2019-08-11: 1 via TRANSDERMAL
  Filled 2019-08-11: qty 1

## 2019-08-11 NOTE — ED Provider Notes (Signed)
[facility] Emergency Department Provider Note   ____________________________________________   First MD Initiated Contact with Patient 08/11/19 1048     (approximate)  I have reviewed the triage vital signs and the nursing notes.   HISTORY  Chief Complaint Shoulder Pain    HPI Justin Landry is a 58 y.o. male Right shoulder pain.  Patient with pain started 2 days ago while using a weed eater on an incline.  Patient is a overextended and pull back was abruptly injuring the shoulder.  Patient has a history of rotator cuff tear of the right shoulder.  Patient rates pain a 7/10.  Patient back pain is "achy".  No palliative measures for complaint.  Elevated blood pressure noted in triage.  Patient has a history of hypertension.  No current prescription for hypertension medicines.      Past Medical History:  Diagnosis Date  . Diabetes mellitus   . High cholesterol   . Humerus fracture    right  . Hypertension   . Inguinal hernia     Patient Active Problem List   Diagnosis Date Noted  . Cellulitis 06/22/2019  . Essential hypertension 05/27/2015  . Type 2 diabetes mellitus without complication (HCC) 05/27/2015  . Inguinal hernia, bilateral  04/10/2014    Past Surgical History:  Procedure Laterality Date  . AMPUTATION TOE Left 06/23/2019   Procedure: AMPUTATION TOE LEFT AMPUTATION;  Surgeon: Linus Galas, DPM;  Location: [facility] ORS;  Service: Podiatry;  Laterality: Left;  . CYSTOSCOPY  2015   Biopsy  . HERNIA REPAIR      Prior to Admission medications   Medication Sig Start Date End Date Taking? Authorizing Provider  Cyanocobalamin (VITAMIN B 12 PO) Take 1 tablet by mouth daily.    [provider]  Fish Oil-Cholecalciferol (FISH OIL + D3) 1000-1000 MG-UNIT CAPS Take 1 capsule by mouth daily with breakfast.     [provider]  Glucosamine-Chondroit-Vit C-Mn (GLUCOSAMINE 1500 COMPLEX PO) Take 1 tablet by mouth daily.     [provider]  HYDROcodone-acetaminophen (NORCO) 7.5-325 MG tablet Take 1 tablet by mouth every 6 (six) hours as needed for moderate pain or severe pain. 06/24/19   Ihor Austin, MD  lisinopril (PRINIVIL,ZESTRIL) 40 MG tablet Take 40 mg by mouth daily.    [provider]  lisinopril (ZESTRIL) 40 MG tablet Take 1 tablet (40 mg total) by mouth daily. 08/11/19   Joni Reining, PA-C  metFORMIN (GLUCOPHAGE) 500 MG tablet Take 500 mg by mouth daily.    [provider]  nicotine (NICODERM CQ - DOSED IN MG/24 HOURS) 21 mg/24hr patch Place 1 patch (21 mg total) onto the skin daily. 06/25/19   Ihor Austin, MD  traMADol (ULTRAM) 50 MG tablet Take 1 tablet (50 mg total) by mouth every 6 (six) hours as needed for up to 5 days. 08/11/19 08/16/19  Joni Reining, PA-C    Allergies Morphine and related, Oxycontin [oxycodone hcl], and Penicillins  Family History  Problem Relation Age of Onset  . Diabetes Mother   . Hypertension Mother   . Diabetes Father   . Hypertension Father   . Cancer Brother        brain    Social History Social History   Tobacco Use  . Smoking status: Current Every Day Smoker    Packs/day: 1.00    Types: Cigarettes  . Smokeless tobacco: Never Used  Substance Use Topics  . Alcohol use: Yes    Alcohol/week:  0.0 standard drinks    Comment: occasional  . Drug use: No    Review of Systems  Constitutional: No fever/chills Eyes: No visual changes. ENT: No sore throat. Cardiovascular: Denies chest pain. Respiratory: Denies shortness of breath. Gastrointestinal: No abdominal pain.  No nausea, no vomiting.  No diarrhea.  No constipation. Genitourinary: Negative for dysuria. Musculoskeletal: Right shoulder pain.   Skin: Negative for rash. Neurological: Negative for headaches, focal weakness or numbness. Endocrine:  Diabetes, hyperlipidemia, hypertension. Allergic/Immunilogical: Morphine and penicillin.   ____________________________________________   PHYSICAL EXAM:  VITAL SIGNS: ED Triage Vitals  Enc Vitals Group     BP 08/11/19 1030 (!) 206/119     Pulse Rate 08/11/19 1030 91     Resp 08/11/19 1030 16     Temp 08/11/19 1030 98.9 F (37.2 C)     Temp Source 08/11/19 1030 Oral     SpO2 08/11/19 1030 95 %     Weight --      Height --      Head Circumference --      Peak Flow --      Pain Score 08/11/19 1041 7     Pain Loc --      Pain Edu? --      Excl. in GC? --    Constitutional: Alert and oriented.  Mild distress. Neck: No cervical spine tenderness to palpation. Cardiovascular: Normal rate, regular rhythm. Grossly normal heart sounds.  Good peripheral circulation.  Elevated blood pressure. Respiratory: Normal respiratory effort.  No retractions. Lungs CTAB. Musculoskeletal: No obvious deformity to the right shoulder.  Patient holds the arm in adduction.  Decreased range of motion with overhead reaching.   Neurologic:  Normal speech and language. No gross focal neurologic deficits are appreciated. No gait instability. Skin:  Skin is warm, dry and intact. No rash noted. Psychiatric: Mood and affect are normal. Speech and behavior are normal.  ____________________________________________   LABS (all labs ordered are listed, but only abnormal results are displayed)  Labs Reviewed - No data to display ____________________________________________  EKG   ____________________________________________  RADIOLOGY  ED MD interpretation:    Official radiology report(s): Dg Shoulder Right  Result Date: 08/11/2019 CLINICAL DATA:  Acute on chronic shoulder pain after pulling injury 2 days ago. EXAM: RIGHT SHOULDER - 2+ VIEW COMPARISON:  Right shoulder x-rays dated January 04, 2019. FINDINGS: No acute fracture or dislocation. Mild acromioclavicular and glenohumeral joint degenerative changes are similar to prior study. Chronic high-riding humeral head. Soft tissues are  unremarkable. IMPRESSION: 1. No acute osseous abnormality. 2. High-riding humeral head, consistent with chronic full-thickness rotator cuff tear. Electronically Signed   By: Obie DredgeWilliam T Derry M.D.   On: 08/11/2019 12:31    ____________________________________________   PROCEDURES  Procedure(s) performed (including Critical Care):  Procedures   ____________________________________________   INITIAL IMPRESSION / ASSESSMENT AND PLAN / ED COURSE  As part of my medical decision making, I reviewed the following data within the electronic MEDICAL RECORD NUMBER         Justin Landry was evaluated in Emergency Department on 08/11/2019 for the symptoms described in the history of present illness. He was evaluated in the context of the global COVID-19 pandemic, which necessitated consideration that the patient might be at risk for infection with the SARS-CoV-2 virus that causes COVID-19. Institutional protocols and algorithms that pertain to the evaluation of patients at risk for COVID-19 are in a state of rapid change based on information released by regulatory bodies including the  CDC and federal and [employer]. These policies and algorithms were followed during the patient's care in the ED.    Patient presents with right shoulder pain.  Patient has history rotator cuff tear.  Discussed x-ray findings with patient.  Patient also present with elevated blood pressure and was given Catapres 2 mg.  Patient placed in arm sling and advised to [facility] per scheduled appointment.   ____________________________________________   FINAL CLINICAL IMPRESSION(S) / ED DIAGNOSES  Final diagnoses:  Rotator cuff injury, left, subsequent encounter  Encounter for medication refill  Essential hypertension     ED Discharge Orders         Ordered    lisinopril (ZESTRIL) 40 MG tablet  Daily     08/11/19 1246    traMADol (ULTRAM) 50 MG tablet  Every 6 hours PRN      08/11/19 1246           Note:  This document was prepared using Dragon voice recognition software and may include unintentional dictation errors.    Sable Feil, PA-C 08/11/19 1254    Duffy Bruce, MD 08/13/19 [id number]

## 2019-08-11 NOTE — ED Notes (Signed)
Pt with c/o right shoulder pain. Cannot raise right shoulder or arm. No swelling noted. Right radial pulse +.

## 2019-08-11 NOTE — ED Notes (Signed)
Patient transported to X-ray 

## 2019-08-11 NOTE — ED Triage Notes (Signed)
Pt to ED via POV c/o right shoulder pain. Pt states that he has been having pain since Thursday, thinks that he may have done something to his rotator cuff while moving. Pt is hypertensive in triage. Pt has been unable to afford his blood pressure medications. Has appointment with [facility] coming up. Pt is in NAD at this time, denies chest pain, visual changes, or headaches.

## 2019-08-11 NOTE — Discharge Instructions (Addendum)
Wear sling for 3 to 4 days as needed.  Follow discharge care instructions and take medication as directed.

## 2019-08-14 ENCOUNTER — Encounter: Payer: Self-pay | Admitting: Gerontology

## 2019-08-14 ENCOUNTER — Ambulatory Visit: Payer: Self-pay | Admitting: Gerontology

## 2019-08-14 ENCOUNTER — Other Ambulatory Visit: Payer: Self-pay

## 2019-08-14 VITALS — BP 141/91 | HR 93 | Temp 97.3°F | Ht 66.0 in | Wt 205.3 lb

## 2019-08-14 DIAGNOSIS — I10 Essential (primary) hypertension: Secondary | ICD-10-CM

## 2019-08-14 DIAGNOSIS — M25511 Pain in right shoulder: Secondary | ICD-10-CM

## 2019-08-14 DIAGNOSIS — F419 Anxiety disorder, unspecified: Secondary | ICD-10-CM

## 2019-08-14 DIAGNOSIS — Z7689 Persons encountering health services in other specified circumstances: Secondary | ICD-10-CM

## 2019-08-14 DIAGNOSIS — Z8719 Personal history of other diseases of the digestive system: Secondary | ICD-10-CM

## 2019-08-14 DIAGNOSIS — E119 Type 2 diabetes mellitus without complications: Secondary | ICD-10-CM

## 2019-08-14 MED ORDER — METFORMIN HCL 500 MG PO TABS: 500.0000 mg | ORAL_TABLET | Freq: Two times a day (BID) | ORAL | 0 refills | Status: DC

## 2019-08-14 MED ORDER — GABAPENTIN 100 MG PO CAPS: 200.0000 mg | ORAL_CAPSULE | Freq: Two times a day (BID) | ORAL | 2 refills | Status: DC

## 2019-08-14 MED ORDER — LISINOPRIL 20 MG PO TABS: 10.0000 mg | ORAL_TABLET | Freq: Every day | ORAL | 0 refills | Status: DC

## 2019-08-14 NOTE — Patient Instructions (Signed)
DASH Eating Plan DASH stands for "Dietary Approaches to Stop Hypertension." The DASH eating plan is a healthy eating plan that has been shown to reduce high blood pressure (hypertension). It may also reduce your risk for type 2 diabetes, heart disease, and stroke. The DASH eating plan may also help with weight loss. What are tips for following this plan?  General guidelines  Avoid eating more than 2,300 mg (milligrams) of salt (sodium) a day. If you have hypertension, you may need to reduce your sodium intake to 1,500 mg a day.  Limit alcohol intake to no more than 1 drink a day for nonpregnant women and 2 drinks a day for men. One drink equals 12 oz of beer, 5 oz of wine, or 1 oz of hard liquor.  Work with your health care provider to maintain a healthy body weight or to lose weight. Ask what an ideal weight is for you.  Get at least 30 minutes of exercise that causes your heart to beat faster (aerobic exercise) most days of the week. Activities may include walking, swimming, or biking.  Work with your health care provider or diet and nutrition specialist (dietitian) to adjust your eating plan to your individual calorie needs. Reading food labels   Check food labels for the amount of sodium per serving. Choose foods with less than 5 percent of the Daily Value of sodium. Generally, foods with less than 300 mg of sodium per serving fit into this eating plan.  To find whole grains, look for the word "whole" as the first word in the ingredient list. Shopping  Buy products labeled as "low-sodium" or "no salt added."  Buy fresh foods. Avoid canned foods and premade or frozen meals. Cooking  Avoid adding salt when cooking. Use salt-free seasonings or herbs instead of table salt or sea salt. Check with your health care provider or pharmacist before using salt substitutes.  Do not fry foods. Cook foods using healthy methods such as baking, boiling, grilling, and broiling instead.  Cook with  heart-healthy oils, such as olive, canola, soybean, or sunflower oil. Meal planning  Eat a balanced diet that includes: ? 5 or more servings of fruits and vegetables each day. At each meal, try to fill half of your plate with fruits and vegetables. ? Up to 6-8 servings of whole grains each day. ? Less than 6 oz of lean meat, poultry, or fish each day. A 3-oz serving of meat is about the same size as a deck of cards. One egg equals 1 oz. ? 2 servings of low-fat dairy each day. ? A serving of nuts, seeds, or beans 5 times each week. ? Heart-healthy fats. Healthy fats called Omega-3 fatty acids are found in foods such as flaxseeds and coldwater fish, like sardines, salmon, and mackerel.  Limit how much you eat of the following: ? Canned or prepackaged foods. ? Food that is high in trans fat, such as fried foods. ? Food that is high in saturated fat, such as fatty meat. ? Sweets, desserts, sugary drinks, and other foods with added sugar. ? Full-fat dairy products.  Do not salt foods before eating.  Try to eat at least 2 vegetarian meals each week.  Eat more home-cooked food and less restaurant, buffet, and fast food.  When eating at a restaurant, ask that your food be prepared with less salt or no salt, if possible. What foods are recommended? The items listed may not be a complete list. Talk with your dietitian about   what dietary choices are best for you. Grains Whole-grain or whole-wheat bread. Whole-grain or whole-wheat pasta. Brown rice. Oatmeal. Quinoa. Bulgur. Whole-grain and low-sodium cereals. Pita bread. Low-fat, low-sodium crackers. Whole-wheat flour tortillas. Vegetables Fresh or frozen vegetables (raw, steamed, roasted, or grilled). Low-sodium or reduced-sodium tomato and vegetable juice. Low-sodium or reduced-sodium tomato sauce and tomato paste. Low-sodium or reduced-sodium canned vegetables. Fruits All fresh, dried, or frozen fruit. Canned fruit in natural juice (without  added sugar). Meat and other protein foods Skinless chicken or turkey. Ground chicken or turkey. Pork with fat trimmed off. Fish and seafood. Egg whites. Dried beans, peas, or lentils. Unsalted nuts, nut butters, and seeds. Unsalted canned beans. Lean cuts of beef with fat trimmed off. Low-sodium, lean deli meat. Dairy Low-fat (1%) or fat-free (skim) milk. Fat-free, low-fat, or reduced-fat cheeses. Nonfat, low-sodium ricotta or cottage cheese. Low-fat or nonfat yogurt. Low-fat, low-sodium cheese. Fats and oils Soft margarine without trans fats. Vegetable oil. Low-fat, reduced-fat, or light mayonnaise and salad dressings (reduced-sodium). Canola, safflower, olive, soybean, and sunflower oils. Avocado. Seasoning and other foods Herbs. Spices. Seasoning mixes without salt. Unsalted popcorn and pretzels. Fat-free sweets. What foods are not recommended? The items listed may not be a complete list. Talk with your dietitian about what dietary choices are best for you. Grains Baked goods made with fat, such as croissants, muffins, or some breads. Dry pasta or rice meal packs. Vegetables Creamed or fried vegetables. Vegetables in a cheese sauce. Regular canned vegetables (not low-sodium or reduced-sodium). Regular canned tomato sauce and paste (not low-sodium or reduced-sodium). Regular tomato and vegetable juice (not low-sodium or reduced-sodium). Pickles. Olives. Fruits Canned fruit in a light or heavy syrup. Fried fruit. Fruit in cream or butter sauce. Meat and other protein foods Fatty cuts of meat. Ribs. Fried meat. Bacon. Sausage. Bologna and other processed lunch meats. Salami. Fatback. Hotdogs. Bratwurst. Salted nuts and seeds. Canned beans with added salt. Canned or smoked fish. Whole eggs or egg yolks. Chicken or turkey with skin. Dairy Whole or 2% milk, cream, and half-and-half. Whole or full-fat cream cheese. Whole-fat or sweetened yogurt. Full-fat cheese. Nondairy creamers. Whipped toppings.  Processed cheese and cheese spreads. Fats and oils Butter. Stick margarine. Lard. Shortening. Ghee. Bacon fat. Tropical oils, such as coconut, palm kernel, or palm oil. Seasoning and other foods Salted popcorn and pretzels. Onion salt, garlic salt, seasoned salt, table salt, and sea salt. Worcestershire sauce. Tartar sauce. Barbecue sauce. Teriyaki sauce. Soy sauce, including reduced-sodium. Steak sauce. Canned and packaged gravies. Fish sauce. Oyster sauce. Cocktail sauce. Horseradish that you find on the shelf. Ketchup. Mustard. Meat flavorings and tenderizers. Bouillon cubes. Hot sauce and Tabasco sauce. Premade or packaged marinades. Premade or packaged taco seasonings. Relishes. Regular salad dressings. Where to find more information:  National Heart, Lung, and Blood Institute: www.nhlbi.nih.gov  American Heart Association: www.heart.org Summary  The DASH eating plan is a healthy eating plan that has been shown to reduce high blood pressure (hypertension). It may also reduce your risk for type 2 diabetes, heart disease, and stroke.  With the DASH eating plan, you should limit salt (sodium) intake to 2,300 mg a day. If you have hypertension, you may need to reduce your sodium intake to 1,500 mg a day.  When on the DASH eating plan, aim to eat more fresh fruits and vegetables, whole grains, lean proteins, low-fat dairy, and heart-healthy fats.  Work with your health care provider or diet and nutrition specialist (dietitian) to adjust your eating plan to your   individual calorie needs. This information is not intended to replace advice given to you by your health care provider. Make sure you discuss any questions you have with your health care provider. Document Released: 11/18/2011 Document Revised: 11/11/2017 Document Reviewed: 11/22/2016 Elsevier Patient Education  2020 Elsevier Inc. Carbohydrate Counting for Diabetes Mellitus, Adult  Carbohydrate counting is a method of keeping track of  how many carbohydrates you eat. Eating carbohydrates naturally increases the amount of sugar (glucose) in the blood. Counting how many carbohydrates you eat helps keep your blood glucose within normal limits, which helps you manage your diabetes (diabetes mellitus). It is important to know how many carbohydrates you can safely have in each meal. This is different for every person. A diet and nutrition specialist (registered dietitian) can help you make a meal plan and calculate how many carbohydrates you should have at each meal and snack. Carbohydrates are found in the following foods:  Grains, such as breads and cereals.  Dried beans and soy products.  Starchy vegetables, such as potatoes, peas, and corn.  Fruit and fruit juices.  Milk and yogurt.  Sweets and snack foods, such as cake, cookies, candy, chips, and soft drinks. How do I count carbohydrates? There are two ways to count carbohydrates in food. You can use either of the methods or a combination of both. Reading "Nutrition Facts" on packaged food The "Nutrition Facts" list is included on the labels of almost all packaged foods and beverages in the [place] It includes:  The serving size.  Information about nutrients in each serving, including the grams (g) of carbohydrate per serving. To use the "Nutrition Facts":  Decide how many servings you will have.  Multiply the number of servings by the number of carbohydrates per serving.  The resulting number is the total amount of carbohydrates that you will be having. Learning standard serving sizes of other foods When you eat carbohydrate foods that are not packaged or do not include "Nutrition Facts" on the label, you need to measure the servings in order to count the amount of carbohydrates:  Measure the foods that you will eat with a food scale or measuring cup, if needed.  Decide how many standard-size servings you will eat.  Multiply the number of servings by 15. Most  carbohydrate-rich foods have about 15 g of carbohydrates per serving. ? For example, if you eat 8 oz (170 g) of strawberries, you will have eaten 2 servings and 30 g of carbohydrates (2 servings x 15 g = 30 g).  For foods that have more than one food mixed, such as soups and casseroles, you must count the carbohydrates in each food that is included. The following list contains standard serving sizes of common carbohydrate-rich foods. Each of these servings has about 15 g of carbohydrates:   hamburger bun or  English muffin.   oz (15 mL) syrup.   oz (14 g) jelly.  1 slice of bread.  1 six-inch tortilla.  3 oz (85 g) cooked rice or pasta.  4 oz (113 g) cooked dried beans.  4 oz (113 g) starchy vegetable, such as peas, corn, or potatoes.  4 oz (113 g) hot cereal.  4 oz (113 g) mashed potatoes or  of a large baked potato.  4 oz (113 g) canned or frozen fruit.  4 oz (120 mL) fruit juice.  4-6 crackers.  6 chicken nuggets.  6 oz (170 g) unsweetened dry cereal.  6 oz (170 g) plain fat-free yogurt or   yogurt sweetened with artificial sweeteners.  8 oz (240 mL) milk.  8 oz (170 g) fresh fruit or one small piece of fruit.  24 oz (680 g) popped popcorn. Example of carbohydrate counting Sample meal  3 oz (85 g) chicken breast.  6 oz (170 g) brown rice.  4 oz (113 g) corn.  8 oz (240 mL) milk.  8 oz (170 g) strawberries with sugar-free whipped topping. Carbohydrate calculation 1. Identify the foods that contain carbohydrates: ? Rice. ? Corn. ? Milk. ? Strawberries. 2. Calculate how many servings you have of each food: ? 2 servings rice. ? 1 serving corn. ? 1 serving milk. ? 1 serving strawberries. 3. Multiply each number of servings by 15 g: ? 2 servings rice x 15 g = 30 g. ? 1 serving corn x 15 g = 15 g. ? 1 serving milk x 15 g = 15 g. ? 1 serving strawberries x 15 g = 15 g. 4. Add together all of the amounts to find the total grams of carbohydrates  eaten: ? 30 g + 15 g + 15 g + 15 g = 75 g of carbohydrates total. Summary  Carbohydrate counting is a method of keeping track of how many carbohydrates you eat.  Eating carbohydrates naturally increases the amount of sugar (glucose) in the blood.  Counting how many carbohydrates you eat helps keep your blood glucose within normal limits, which helps you manage your diabetes.  A diet and nutrition specialist (registered dietitian) can help you make a meal plan and calculate how many carbohydrates you should have at each meal and snack. This information is not intended to replace advice given to you by your health care provider. Make sure you discuss any questions you have with your health care provider. Document Released: 11/29/2005 Document Revised: 06/23/2017 Document Reviewed: 05/12/2016 Elsevier Patient Education  2020 Elsevier Inc.  

## 2019-08-14 NOTE — Progress Notes (Signed)
Patient ID: Justin BodoClarence Ray Landry, male   DOB: [birth year], 58 y.o.   MRN: [id number]  Chief Complaint  Patient presents with  . Establish Care    needs medications refilled, been out of BP meds for quite some time  . Hypertension    HPI Justin Landry is a 58 y.o. male who presents to establish care and evaluation of his chronic problems. [facility] is completely [facility]. He reports that his 2nd left toe is swollen, but denies erythema and drainage. [facility] showed, that there does appear to be some lytic destruction at the distal aspect of the distal phalanx along the medial corner consistent with his ulcerative area. Suspicious for early osteomyelitis.   He was evaluated at the ED on 08/11/2019 for right shoulder pain. Right shoulder x ray was done and it showed no acute osseous abnormality, and high -riding humeral head, consistent with chronic full-thickness rotator cuff tear. He complain of constant sharp non radiating 8/10 pain when touched or with activity. He is using right shoulder sling, and taking gabapentin 100 mg bid offers minimal relief.  He also reports that he has a history of right inguinal hernia and he continues to experience constant non radiating dull 7/10 pain to right groin area. He reports that he notices bulge when he is walking but it resolves when at rest. [facility] 10/14/2014, he had right hernia repair at another hospital in 2009.  His HgbA1c done on 06/21/2019 was 9.8% and he states that he takes 500 mg metformin daily and checks his blood glucose with his friends glucometer. He states that his blood glucose reading sometimes ranges between 250- 400 mg/dl. Currently he denies hypo/hyperglycemic symptoms and peripheral neuropathy. His blood glucose was checked during visit and it was 95 mg/dl. He states that he experiences anxiety attacks because of his current situation,  being unemployed and staying with a friend. He reports that his blood pressure rises during anxiety attacks, he denies suicidal or homicidal ideation. He reports taking 40 mg lisinopril but has being out of medication for some months. He doesn't check blood pressure but adheres to low sodium diet. He smokes 1 pack of cigarette daily and admits the desire to quit. He states that he's doing well and his main concern is to " get his right shoulder and right inguinal hernia fixed', otherwise he offers no further concern.  Past Medical History:  Diagnosis Date  . Diabetes mellitus   . High cholesterol   . Humerus fracture    right  . Hypertension   . Inguinal hernia     Past Surgical History:  Procedure Laterality Date  . AMPUTATION TOE Left 06/23/2019   Procedure: AMPUTATION TOE LEFT AMPUTATION;  Surgeon: Linus Galasline, Todd, DPM;  Location: [facility] ORS;  Service: Podiatry;  Laterality: Left;  . CYSTOSCOPY  2015   Biopsy  . HERNIA REPAIR      Family History  Problem Relation Age of Onset  . Diabetes Mother   . Hypertension Mother   . Diabetes Father   . Hypertension Father   . Cancer Brother        brain    Social History Social History   Tobacco Use  . Smoking status: Current Every Day Smoker    Packs/day: 1.00    Types: Cigarettes  . Smokeless tobacco: Never Used  Substance Use Topics  . Alcohol use:  Yes    Alcohol/week: 0.0 standard drinks    Comment: occasional  . Drug use: No    Allergies  Allergen Reactions  . Morphine And Related Nausea And Vomiting and Other (See Comments)    Extreme sweating   . Oxycontin [Oxycodone Hcl] Other (See Comments)    Extreme NAusea and vomitting follows (Patient Tolerates Percocet short acting)  . Penicillins Nausea And Vomiting    Has patient had a PCN reaction causing immediate rash, facial/tongue/throat swelling, SOB or lightheadedness with hypotension: No Has patient had a PCN reaction causing severe rash involving mucus membranes or skin  necrosis: No Has patient had a PCN reaction that required hospitalization No Has patient had a PCN reaction occurring within the last 10 years: No If all of the above answers are "NO", then may proceed with Cephalosporin use.     Current Outpatient Medications  Medication Sig Dispense Refill  . Cyanocobalamin (VITAMIN B 12 PO) Take 1 tablet by mouth daily.    . Fish Oil-Cholecalciferol (FISH OIL + D3) 1000-1000 MG-UNIT CAPS Take 1 capsule by mouth daily with breakfast.     . gabapentin (NEURONTIN) 100 MG capsule Take 2 capsules (200 mg total) by mouth 2 (two) times daily. 60 capsule 2  . Glucosamine-Chondroit-Vit C-Mn (GLUCOSAMINE 1500 COMPLEX PO) Take 1 tablet by mouth daily.    . metFORMIN (GLUCOPHAGE) 500 MG tablet Take 1 tablet (500 mg total) by mouth 2 (two) times daily with a meal. 60 tablet 0  . lisinopril (ZESTRIL) 20 MG tablet Take 0.5 tablets (10 mg total) by mouth daily. 30 tablet 0   No current facility-administered medications for this visit.     Review of Systems Review of Systems  Constitutional: Negative.   HENT: Negative.   Respiratory: Negative.   Cardiovascular: Negative.   Gastrointestinal: Negative.  Negative for abdominal pain.       Right inguinal hernia  Endocrine: Negative.   Genitourinary: Negative.   Musculoskeletal: Positive for arthralgias (right shoulder pain).  Skin: Negative.   Neurological: Negative.   Psychiatric/Behavioral: The patient is nervous/anxious.     Blood pressure (!) 141/91, pulse 93, temperature (!) 97.3 F (36.3 C), height 5\' 6"  (1.676 m), weight 205 lb 4.8 oz (93.1 kg), SpO2 99 %.  Physical Exam Physical Exam Constitutional:      Appearance: Normal appearance.  HENT:     Head: Normocephalic and atraumatic.     Nose: Nose normal.     Mouth/Throat:     Mouth: Mucous membranes are moist.  Eyes:     Extraocular Movements: Extraocular movements intact.     Pupils: Pupils are equal, round, and reactive to light.  Neck:      Musculoskeletal: Normal range of motion.  Cardiovascular:     Rate and Rhythm: Normal rate and regular rhythm.     Pulses: Normal pulses.     Heart sounds: Normal heart sounds.  Pulmonary:     Effort: Pulmonary effort is normal.     Breath sounds: Normal breath sounds.  Abdominal:     General: Bowel sounds are normal.     Palpations: Abdomen is soft.     Hernia: A hernia (right inguinal) is present.  Genitourinary:    Comments: Deferred per patient.  Musculoskeletal:        General: Tenderness (right shoulder on palpation) present.       Feet:  Skin:    General: Skin is warm and dry.  Neurological:     General: No  focal deficit present.     Mental Status: He is alert and oriented to person, place, and time.  Psychiatric:        Mood and Affect: Mood normal.        Thought Content: Thought content normal.        Judgment: Judgment normal.     Data Reviewed Lab review and past medical history was reviewed.  Assessment and Plan 1. Essential hypertension - His blood pressure was  141/91, he was advised to continue on 10 mg lisinopril, was educated on medication side effect and advised to notify clinic with dry cough. -Low salt DASH diet -Take medications regularly on time -Exercise regularly as tolerated -Check blood pressure at least once a week at home or a nearby pharmacy, record and bring log to office visit. -Goal is less than 140/90 and normal blood pressure is 120/80 - lisinopril (ZESTRIL) 20 MG tablet; Take 0.5 tablets (10 mg total) by mouth daily.  Dispense: 30 tablet; Refill: 0  2. History of right inguinal hernia - He was advised to complete charity care application for - Ambulatory referral to General Surgery  3. Encounter to establish care - His lab and past medical history was reviewed. -He was strongly encouraged on smoking cessation. - He was advised to continue to monitor 2nd left toe and notify clinic for any erythema, pain, worsening edema and drainage.  4. Anxiety - He will follow up with Ms. Simpson H  5. Right shoulder pain, unspecified chronicity - He will continue on gabapentin and encouraged to complete charity care application for Gen surgery referral. - gabapentin (NEURONTIN) 100 MG capsule; Take 2 capsules (200 mg total) by mouth 2 (two) times daily.  Dispense: 60 capsule; Refill: 2 - Ambulatory referral to General Surgery  6. Type 2 diabetes mellitus without complication, without long-term current use of insulin (HCC) - His HgbA1c done on 06/21/19 was 9.8 5, his goal is < 7 %, he was advised to check fasting blood glucose daily and his goal should be between 80-130 mg/dl. He's to record and bring log to next office visit.He was provided and taught how to use glucometer. -Use Diabetic diet as advised  -Take medications regularly as advised -Regular exercise - metFORMIN (GLUCOPHAGE) 500 MG tablet; Take 1 tablet (500 mg total) by mouth 2 (two) times daily with a meal.  Dispense: 60 tablet; Refill: 0   Follow up in 4 weeks ( 09/11/2019) or if symptoms worsen or fails to improve.  Viren Lebeau E Khylen Riolo 08/14/2019, 11:03 PM

## 2019-08-22 ENCOUNTER — Ambulatory Visit: Payer: Self-pay | Admitting: Pharmacy Technician

## 2019-08-22 ENCOUNTER — Other Ambulatory Visit: Payer: Self-pay

## 2019-08-22 DIAGNOSIS — Z79899 Other long term (current) drug therapy: Secondary | ICD-10-CM

## 2019-08-22 NOTE — Progress Notes (Signed)
Met with patient completed financial assistance application for [place] due to recent hospital visit.  Patient agreed to be responsible for gathering financial information and forwarding to appropriate department in [facility].    Completed [facility] application and contract.  Patient agreed to all terms of the [facility] contract.    Patient approved to receive medication assistance at [facility] as long as eligibility criteria continues to be met.    Provided patient with Civil engineer, contracting based on his particular needs.    Patient stated that he has episodes where he blacks out and becomes aggressive.  Police have to get involved to calm him down.  Wants to be connected with a counselor and mental health professional.  Referring to [facility].  Also, provided patient with contact information about [place] and [place].  Patient stated that he is homeless.  Sending a referral to [employer].  [facility]

## 2019-08-23 ENCOUNTER — Other Ambulatory Visit: Payer: Self-pay

## 2019-08-23 ENCOUNTER — Encounter: Payer: Self-pay | Admitting: Gerontology

## 2019-08-23 ENCOUNTER — Ambulatory Visit: Payer: Self-pay | Admitting: Gerontology

## 2019-08-23 VITALS — BP 169/101 | HR 86 | Temp 98.1°F | Ht 66.5 in | Wt 209.0 lb

## 2019-08-23 DIAGNOSIS — L97529 Non-pressure chronic ulcer of other part of left foot with unspecified severity: Secondary | ICD-10-CM | POA: Insufficient documentation

## 2019-08-23 DIAGNOSIS — M25511 Pain in right shoulder: Secondary | ICD-10-CM

## 2019-08-23 DIAGNOSIS — E119 Type 2 diabetes mellitus without complications: Secondary | ICD-10-CM

## 2019-08-23 DIAGNOSIS — Z8719 Personal history of other diseases of the digestive system: Secondary | ICD-10-CM

## 2019-08-23 DIAGNOSIS — E08621 Diabetes mellitus due to underlying condition with foot ulcer: Secondary | ICD-10-CM

## 2019-08-23 DIAGNOSIS — I10 Essential (primary) hypertension: Secondary | ICD-10-CM

## 2019-08-23 DIAGNOSIS — Z8631 Personal history of diabetic foot ulcer: Secondary | ICD-10-CM | POA: Insufficient documentation

## 2019-08-23 DIAGNOSIS — L97521 Non-pressure chronic ulcer of other part of left foot limited to breakdown of skin: Secondary | ICD-10-CM

## 2019-08-23 MED ORDER — METFORMIN HCL 500 MG PO TABS: 1000.0000 mg | ORAL_TABLET | Freq: Two times a day (BID) | ORAL | 1 refills | Status: DC

## 2019-08-23 MED ORDER — METFORMIN HCL 1000 MG PO TABS: 1000.0000 mg | ORAL_TABLET | Freq: Two times a day (BID) | ORAL | 1 refills | Status: DC

## 2019-08-23 MED ORDER — GABAPENTIN 400 MG PO CAPS: 400.0000 mg | ORAL_CAPSULE | Freq: Three times a day (TID) | ORAL | 1 refills | Status: DC

## 2019-08-23 MED ORDER — GABAPENTIN 100 MG PO CAPS: 400.0000 mg | ORAL_CAPSULE | Freq: Three times a day (TID) | ORAL | 1 refills | Status: DC

## 2019-08-23 MED ORDER — LISINOPRIL 20 MG PO TABS: 40.0000 mg | ORAL_TABLET | Freq: Every day | ORAL | 1 refills | Status: DC

## 2019-08-23 NOTE — Patient Instructions (Signed)
Carbohydrate Counting for Diabetes Mellitus, Adult  Carbohydrate counting is a method of keeping track of how many carbohydrates you eat. Eating carbohydrates naturally increases the amount of sugar (glucose) in the blood. Counting how many carbohydrates you eat helps keep your blood glucose within normal limits, which helps you manage your diabetes (diabetes mellitus). It is important to know how many carbohydrates you can safely have in each meal. This is different for every person. A diet and nutrition specialist (registered dietitian) can help you make a meal plan and calculate how many carbohydrates you should have at each meal and snack. Carbohydrates are found in the following foods:  Grains, such as breads and cereals.  Dried beans and soy products.  Starchy vegetables, such as potatoes, peas, and corn.  Fruit and fruit juices.  Milk and yogurt.  Sweets and snack foods, such as cake, cookies, candy, chips, and soft drinks. How do I count carbohydrates? There are two ways to count carbohydrates in food. You can use either of the methods or a combination of both. Reading "Nutrition Facts" on packaged food The "Nutrition Facts" list is included on the labels of almost all packaged foods and beverages in the [place] It includes:  The serving size.  Information about nutrients in each serving, including the grams (g) of carbohydrate per serving. To use the "Nutrition Facts":  Decide how many servings you will have.  Multiply the number of servings by the number of carbohydrates per serving.  The resulting number is the total amount of carbohydrates that you will be having. Learning standard serving sizes of other foods When you eat carbohydrate foods that are not packaged or do not include "Nutrition Facts" on the label, you need to measure the servings in order to count the amount of carbohydrates:  Measure the foods that you will eat with a food scale or measuring cup, if needed.   Decide how many standard-size servings you will eat.  Multiply the number of servings by 15. Most carbohydrate-rich foods have about 15 g of carbohydrates per serving. ? For example, if you eat 8 oz (170 g) of strawberries, you will have eaten 2 servings and 30 g of carbohydrates (2 servings x 15 g = 30 g).  For foods that have more than one food mixed, such as soups and casseroles, you must count the carbohydrates in each food that is included. The following list contains standard serving sizes of common carbohydrate-rich foods. Each of these servings has about 15 g of carbohydrates:   hamburger bun or  English muffin.   oz (15 mL) syrup.   oz (14 g) jelly.  1 slice of bread.  1 six-inch tortilla.  3 oz (85 g) cooked rice or pasta.  4 oz (113 g) cooked dried beans.  4 oz (113 g) starchy vegetable, such as peas, corn, or potatoes.  4 oz (113 g) hot cereal.  4 oz (113 g) mashed potatoes or  of a large baked potato.  4 oz (113 g) canned or frozen fruit.  4 oz (120 mL) fruit juice.  4-6 crackers.  6 chicken nuggets.  6 oz (170 g) unsweetened dry cereal.  6 oz (170 g) plain fat-free yogurt or yogurt sweetened with artificial sweeteners.  8 oz (240 mL) milk.  8 oz (170 g) fresh fruit or one small piece of fruit.  24 oz (680 g) popped popcorn. Example of carbohydrate counting Sample meal  3 oz (85 g) chicken breast.  6 oz (170 g)   brown rice.  4 oz (113 g) corn.  8 oz (240 mL) milk.  8 oz (170 g) strawberries with sugar-free whipped topping. Carbohydrate calculation 1. Identify the foods that contain carbohydrates: ? Rice. ? Corn. ? Milk. ? Strawberries. 2. Calculate how many servings you have of each food: ? 2 servings rice. ? 1 serving corn. ? 1 serving milk. ? 1 serving strawberries. 3. Multiply each number of servings by 15 g: ? 2 servings rice x 15 g = 30 g. ? 1 serving corn x 15 g = 15 g. ? 1 serving milk x 15 g = 15 g. ? 1 serving  strawberries x 15 g = 15 g. 4. Add together all of the amounts to find the total grams of carbohydrates eaten: ? 30 g + 15 g + 15 g + 15 g = 75 g of carbohydrates total. Summary  Carbohydrate counting is a method of keeping track of how many carbohydrates you eat.  Eating carbohydrates naturally increases the amount of sugar (glucose) in the blood.  Counting how many carbohydrates you eat helps keep your blood glucose within normal limits, which helps you manage your diabetes.  A diet and nutrition specialist (registered dietitian) can help you make a meal plan and calculate how many carbohydrates you should have at each meal and snack. This information is not intended to replace advice given to you by your health care provider. Make sure you discuss any questions you have with your health care provider. Document Released: 11/29/2005 Document Revised: 06/23/2017 Document Reviewed: 05/12/2016 Elsevier Patient Education  2020 [employer] DASH Eating Plan DASH stands for "Dietary Approaches to Stop Hypertension." The DASH eating plan is a healthy eating plan that has been shown to reduce high blood pressure (hypertension). It may also reduce your risk for type 2 diabetes, heart disease, and stroke. The DASH eating plan may also help with weight loss. What are tips for following this plan?  General guidelines  Avoid eating more than 2,300 mg (milligrams) of salt (sodium) a day. If you have hypertension, you may need to reduce your sodium intake to 1,500 mg a day.  Limit alcohol intake to no more than 1 drink a day for nonpregnant women and 2 drinks a day for men. One drink equals 12 oz of beer, 5 oz of wine, or 1 oz of hard liquor.  Work with your health care provider to maintain a healthy body weight or to lose weight. Ask what an ideal weight is for you.  Get at least 30 minutes of exercise that causes your heart to beat faster (aerobic exercise) most days of the week. Activities may  include walking, swimming, or biking.  Work with your health care provider or diet and nutrition specialist (dietitian) to adjust your eating plan to your individual calorie needs. Reading food labels   Check food labels for the amount of sodium per serving. Choose foods with less than 5 percent of the Daily Value of sodium. Generally, foods with less than 300 mg of sodium per serving fit into this eating plan.  To find whole grains, look for the word "whole" as the first word in the ingredient list. Shopping  Buy products labeled as "low-sodium" or "no salt added."  Buy fresh foods. Avoid canned foods and premade or frozen meals. Cooking  Avoid adding salt when cooking. Use salt-free seasonings or herbs instead of table salt or sea salt. Check with your health care provider or pharmacist before using salt substitutes.    Do not fry foods. Cook foods using healthy methods such as baking, boiling, grilling, and broiling instead.  Cook with heart-healthy oils, such as olive, canola, soybean, or sunflower oil. Meal planning  Eat a balanced diet that includes: ? 5 or more servings of fruits and vegetables each day. At each meal, try to fill half of your plate with fruits and vegetables. ? Up to 6-8 servings of whole grains each day. ? Less than 6 oz of lean meat, poultry, or fish each day. A 3-oz serving of meat is about the same size as a deck of cards. One egg equals 1 oz. ? 2 servings of low-fat dairy each day. ? A serving of nuts, seeds, or beans 5 times each week. ? Heart-healthy fats. Healthy fats called Omega-3 fatty acids are found in foods such as flaxseeds and coldwater fish, like sardines, salmon, and mackerel.  Limit how much you eat of the following: ? Canned or prepackaged foods. ? Food that is high in trans fat, such as fried foods. ? Food that is high in saturated fat, such as fatty meat. ? Sweets, desserts, sugary drinks, and other foods with added sugar. ? Full-fat  dairy products.  Do not salt foods before eating.  Try to eat at least 2 vegetarian meals each week.  Eat more home-cooked food and less restaurant, buffet, and fast food.  When eating at a restaurant, ask that your food be prepared with less salt or no salt, if possible. What foods are recommended? The items listed may not be a complete list. Talk with your dietitian about what dietary choices are best for you. Grains Whole-grain or whole-wheat bread. Whole-grain or whole-wheat pasta. Brown rice. Oatmeal. Quinoa. Bulgur. Whole-grain and low-sodium cereals. Pita bread. Low-fat, low-sodium crackers. Whole-wheat flour tortillas. Vegetables Fresh or frozen vegetables (raw, steamed, roasted, or grilled). Low-sodium or reduced-sodium tomato and vegetable juice. Low-sodium or reduced-sodium tomato sauce and tomato paste. Low-sodium or reduced-sodium canned vegetables. Fruits All fresh, dried, or frozen fruit. Canned fruit in natural juice (without added sugar). Meat and other protein foods Skinless chicken or turkey. Ground chicken or turkey. Pork with fat trimmed off. Fish and seafood. Egg whites. Dried beans, peas, or lentils. Unsalted nuts, nut butters, and seeds. Unsalted canned beans. Lean cuts of beef with fat trimmed off. Low-sodium, lean deli meat. Dairy Low-fat (1%) or fat-free (skim) milk. Fat-free, low-fat, or reduced-fat cheeses. Nonfat, low-sodium ricotta or cottage cheese. Low-fat or nonfat yogurt. Low-fat, low-sodium cheese. Fats and oils Soft margarine without trans fats. Vegetable oil. Low-fat, reduced-fat, or light mayonnaise and salad dressings (reduced-sodium). Canola, safflower, olive, soybean, and sunflower oils. Avocado. Seasoning and other foods Herbs. Spices. Seasoning mixes without salt. Unsalted popcorn and pretzels. Fat-free sweets. What foods are not recommended? The items listed may not be a complete list. Talk with your dietitian about what dietary choices are best  for you. Grains Baked goods made with fat, such as croissants, muffins, or some breads. Dry pasta or rice meal packs. Vegetables Creamed or fried vegetables. Vegetables in a cheese sauce. Regular canned vegetables (not low-sodium or reduced-sodium). Regular canned tomato sauce and paste (not low-sodium or reduced-sodium). Regular tomato and vegetable juice (not low-sodium or reduced-sodium). Pickles. Olives. Fruits Canned fruit in a light or heavy syrup. Fried fruit. Fruit in cream or butter sauce. Meat and other protein foods Fatty cuts of meat. Ribs. Fried meat. Bacon. Sausage. Bologna and other processed lunch meats. Salami. Fatback. Hotdogs. Bratwurst. Salted nuts and seeds. Canned beans with   added salt. Canned or smoked fish. Whole eggs or egg yolks. Chicken or turkey with skin. Dairy Whole or 2% milk, cream, and half-and-half. Whole or full-fat cream cheese. Whole-fat or sweetened yogurt. Full-fat cheese. Nondairy creamers. Whipped toppings. Processed cheese and cheese spreads. Fats and oils Butter. Stick margarine. Lard. Shortening. Ghee. Bacon fat. Tropical oils, such as coconut, palm kernel, or palm oil. Seasoning and other foods Salted popcorn and pretzels. Onion salt, garlic salt, seasoned salt, table salt, and sea salt. Worcestershire sauce. Tartar sauce. Barbecue sauce. Teriyaki sauce. Soy sauce, including reduced-sodium. Steak sauce. Canned and packaged gravies. Fish sauce. Oyster sauce. Cocktail sauce. Horseradish that you find on the shelf. Ketchup. Mustard. Meat flavorings and tenderizers. Bouillon cubes. Hot sauce and Tabasco sauce. Premade or packaged marinades. Premade or packaged taco seasonings. Relishes. Regular salad dressings. Where to find more information:  National Heart, Lung, and Blood Institute: www.nhlbi.nih.gov  American Heart Association: www.heart.org Summary  The DASH eating plan is a healthy eating plan that has been shown to reduce high blood pressure  (hypertension). It may also reduce your risk for type 2 diabetes, heart disease, and stroke.  With the DASH eating plan, you should limit salt (sodium) intake to 2,300 mg a day. If you have hypertension, you may need to reduce your sodium intake to 1,500 mg a day.  When on the DASH eating plan, aim to eat more fresh fruits and vegetables, whole grains, lean proteins, low-fat dairy, and heart-healthy fats.  Work with your health care provider or diet and nutrition specialist (dietitian) to adjust your eating plan to your individual calorie needs. This information is not intended to replace advice given to you by your health care provider. Make sure you discuss any questions you have with your health care provider. Document Released: 11/18/2011 Document Revised: 11/11/2017 Document Reviewed: 11/22/2016 Elsevier Patient Education  2020 Elsevier Inc.  

## 2019-08-23 NOTE — Progress Notes (Signed)
Established Patient Office Visit  Subjective:  Patient ID: Justin Landry, male    DOB: [birth year]  Age: 58 y.o. MRN: [id number]  CC:  Chief Complaint  Patient presents with  . Annual Exam    History and Physical for Podiatry visit    HPI Justin Landry presents for follow up of right shoulder pain, right inguinal hernia, ulcerative area to 2nd left toe, hypertension and lab review. He will be undergoing amputation to the 2nd toe of his left foot at [facility]. He continues to experience constant sharp non radiating 8/10 pain when touched or with activity to his right shoulder. He is using right shoulder sling, and taking gabapentin 100 mg bid offers no relief, and he reports taking 400 mg tid. He will follow up with General surgery on 08/28/19 for his inguinal hernia. He states that he's compliant with his medications and making life style modifications. He checks his blood glucose daily and he states that his fasting blood glucose is between 150-160 mg/dl. He doesn't check his blood pressure at home and unable to exercise due to his shoulder pain and inguinal hernia. He continues to smoke 1 pack of cigarette daily and admits the desire to quit. He states that he's doing well and offers no further concern.   Past Medical History:  Diagnosis Date  . Diabetes mellitus   . High cholesterol   . Humerus fracture    right  . Hypertension   . Inguinal hernia     Past Surgical History:  Procedure Laterality Date  . AMPUTATION TOE Left 06/23/2019   Procedure: AMPUTATION TOE LEFT AMPUTATION;  Surgeon: Sharlotte Alamo, DPM;  Location: [facility] ORS;  Service: Podiatry;  Laterality: Left;  . CYSTOSCOPY  2015   Biopsy  . HERNIA REPAIR      Family History  Problem Relation Age of Onset  . Diabetes Mother   . Hypertension Mother   . Diabetes Father   . Hypertension Father   . Cancer Brother        brain    Social History   Socioeconomic History  . Marital status: Single    Spouse  name: Not on file  . Number of children: Not on file  . Years of education: Not on file  . Highest education level: Not on file  Occupational History  . Not on file  Social Needs  . Financial resource strain: Not on file  . Food insecurity    Worry: Not on file    Inability: Not on file  . Transportation needs    Medical: Not on file    Non-medical: Not on file  Tobacco Use  . Smoking status: Current Every Day Smoker    Packs/day: 1.00    Types: Cigarettes  . Smokeless tobacco: Never Used  Substance and Sexual Activity  . Alcohol use: Yes    Alcohol/week: 0.0 standard drinks    Comment: occasional  . Drug use: No  . Sexual activity: Yes  Lifestyle  . Physical activity    Days per week: Not on file    Minutes per session: Not on file  . Stress: Not on file  Relationships  . Social Herbalist on phone: Not on file    Gets together: Not on file    Attends religious service: Not on file    Active member of club or organization: Not on file    Attends meetings of clubs or organizations: Not on file  Relationship status: Not on file  . Intimate partner violence    Fear of current or ex partner: Not on file    Emotionally abused: Not on file    Physically abused: Not on file    Forced sexual activity: Not on file  Other Topics Concern  . Not on file  Social History Narrative  . Not on file    Outpatient Medications Prior to Visit  Medication Sig Dispense Refill  . Cyanocobalamin (VITAMIN B 12 PO) Take 1 tablet by mouth daily.    . Fish Oil-Cholecalciferol (FISH OIL + D3) 1000-1000 MG-UNIT CAPS Take 1 capsule by mouth daily with breakfast.     . Glucosamine-Chondroit-Vit C-Mn (GLUCOSAMINE 1500 COMPLEX PO) Take 1 tablet by mouth daily.    Marland Kitchen gabapentin (NEURONTIN) 100 MG capsule Take 2 capsules (200 mg total) by mouth 2 (two) times daily. 60 capsule 2  . lisinopril (ZESTRIL) 20 MG tablet Take 0.5 tablets (10 mg total) by mouth daily. 30 tablet 0  . metFORMIN  (GLUCOPHAGE) 500 MG tablet Take 1 tablet (500 mg total) by mouth 2 (two) times daily with a meal. 60 tablet 0   No facility-administered medications prior to visit.     Allergies  Allergen Reactions  . Morphine And Related Nausea And Vomiting and Other (See Comments)    Extreme sweating   . Oxycontin [Oxycodone Hcl] Other (See Comments)    Extreme NAusea and vomitting follows (Patient Tolerates Percocet short acting)  . Penicillins Nausea And Vomiting    Has patient had a PCN reaction causing immediate rash, facial/tongue/throat swelling, SOB or lightheadedness with hypotension: No Has patient had a PCN reaction causing severe rash involving mucus membranes or skin necrosis: No Has patient had a PCN reaction that required hospitalization No Has patient had a PCN reaction occurring within the last 10 years: No If all of the above answers are "NO", then may proceed with Cephalosporin use.     ROS Review of Systems  Constitutional: Negative.   HENT: Negative.   Eyes: Negative.   Respiratory: Negative.   Cardiovascular: Negative.   Gastrointestinal: Negative.   Endocrine: Negative.   Genitourinary: Negative.        Right inguinal hernia  Musculoskeletal: Positive for arthralgias (.right shoulder pain).  Skin: Positive for wound (Diabetic ulcer to 2nd toe to left foot). Negative for pallor.  Allergic/Immunologic: Negative.   Neurological: Negative.   Psychiatric/Behavioral: Negative.       Objective:    Physical Exam  Constitutional: He appears well-developed and well-nourished.  HENT:  Head: Normocephalic and atraumatic.  Eyes: Pupils are equal, round, and reactive to light. EOM are normal.  Neck: Normal range of motion.  Cardiovascular: Normal rate and regular rhythm.  Pulmonary/Chest: Effort normal and breath sounds normal.  Abdominal: Soft. Bowel sounds are normal.  Genitourinary:    Genitourinary Comments: Right inguinal hernia, deferred assessment per patient.    Musculoskeletal:        General: Tenderness (right shoulder pain) present.  Neurological: He is alert. He has normal reflexes.  Skin:  Diabetic ulcer to 2nd toe of left foot.  Psychiatric: He has a normal mood and affect. His behavior is normal. Judgment and thought content normal.    BP (!) 169/101 (BP Location: Left Arm, Patient Position: Sitting)   Pulse 86   Temp 98.1 F (36.7 C)   Ht 5' 6.5" (1.689 m)   Wt 209 lb (94.8 kg)   SpO2 97%   BMI 33.23 kg/m  Wt Readings from Last 3 Encounters:  08/23/19 209 lb (94.8 kg)  08/14/19 205 lb 4.8 oz (93.1 kg)  08/11/19 205 lb (93 kg)   He was encouraged to continue on weight loss regimen.  Health Maintenance Due  Topic Date Due  . Hepatitis C Screening  Apr 15, 1961  . PNEUMOCOCCAL POLYSACCHARIDE VACCINE AGE 76-64 HIGH RISK  10/11/1964  . OPHTHALMOLOGY EXAM  10/11/1972  . HIV Screening  10/11/1977  . COLONOSCOPY  10/11/2012  . INFLUENZA VACCINE  07/14/2019    There are no preventive care reminders to display for this patient.  No results found for: TSH Lab Results  Component Value Date   WBC 9.9 06/24/2019   HGB 16.1 06/24/2019   HCT 46.1 06/24/2019   MCV 87.1 06/24/2019   PLT 285 06/24/2019   Lab Results  Component Value Date   NA 138 06/23/2019   K 3.8 06/24/2019   CO2 30 06/23/2019   GLUCOSE 208 (H) 06/23/2019   BUN 12 06/23/2019   CREATININE 0.65 06/24/2019   BILITOT 0.5 06/21/2019   ALKPHOS 75 06/21/2019   AST 26 06/21/2019   ALT 30 06/21/2019   PROT 7.8 06/21/2019   ALBUMIN 4.2 06/21/2019   CALCIUM 9.8 06/23/2019   ANIONGAP 9 06/23/2019   GFR 87.21 05/27/2015   Lab Results  Component Value Date   CHOL 186 05/27/2015   Lab Results  Component Value Date   HDL 38.00 (L) 05/27/2015   Lab Results  Component Value Date   LDLCALC UNABLE TO CALCULATE IF TRIGLYCERIDE OVER 400 mg/dL 06/20/2012   Lab Results  Component Value Date   TRIG 313.0 (H) 05/27/2015   Lab Results  Component Value Date    CHOLHDL 5 05/27/2015   Lab Results  Component Value Date   HGBA1C 9.8 (H) 06/21/2019      Assessment & Plan:     1. Essential hypertension -Uncontrolled blood pressure, his goal is < 140/90, will increase lisinopril to 40 mg daily. He was encouraged to check blood pressure daily, record and bring log to clinic. Continue on dash diet and exercise as tolerated. - lisinopril (ZESTRIL) 20 MG tablet; Take 2 tablets (40 mg total) by mouth daily.  Dispense: 60 tablet; Refill: 1  2. Type 2 diabetes mellitus without complication, without long-term current use of insulin (HCC) - His Metformin will be increased to 1000 mg bid, his goal fasting blood glucose should be between 80-130 mg/dl. He was advised to check blood glucose twice daily, record and bring to clinic. He will continue on low carb/non concentrated sweet diet. - HgB A1c; Future - Lipid panel; Future - Comp Met (CMET); Future - metFORMIN (GLUCOPHAGE) 1000 MG tablet; Take 1 tablet (1,000 mg total) by mouth 2 (two) times daily with a meal.  Dispense: 60 tablet; Refill: 1  3. Right shoulder pain, unspecified chronicity - He was encouraged to complete charity care application for - Ambulatory referral to General Surgery. Will increase - gabapentin (NEURONTIN) 400 MG capsule; Take 1 capsule (400 mg total) by mouth 3 (three) times daily.  Dispense: 90 capsule; Refill: 1  4. Diabetic ulcer of toe of left foot associated with diabetes mellitus due to underlying condition, limited to breakdown of skin ([place]) - He will follow up at [facility] for toe amputation.  5. History of right inguinal hernia - He was advised to keep his appointment with Dr Bearl Mulberry on 08/28/2019   Follow-up: Return in about 6 weeks (around 10/04/2019), or if symptoms worsen or fail  to improve.    Justin Mowers Jerold Coombe, NP

## 2019-08-27 ENCOUNTER — Other Ambulatory Visit: Payer: Self-pay | Admitting: Podiatry

## 2019-08-28 ENCOUNTER — Other Ambulatory Visit
Admission: RE | Admit: 2019-08-28 | Discharge: 2019-08-28 | Disposition: A | Discharge status: Home / Self Care | Payer: Self-pay | Source: Ambulatory Visit | Attending: Podiatry | Admitting: Podiatry

## 2019-08-28 ENCOUNTER — Ambulatory Visit: Payer: Self-pay | Admitting: General Surgery

## 2019-08-28 ENCOUNTER — Other Ambulatory Visit: Payer: Self-pay

## 2019-08-28 DIAGNOSIS — Z01818 Encounter for other preprocedural examination: Secondary | ICD-10-CM | POA: Insufficient documentation

## 2019-08-28 DIAGNOSIS — Z20828 Contact with and (suspected) exposure to other viral communicable diseases: Secondary | ICD-10-CM | POA: Insufficient documentation

## 2019-08-28 DIAGNOSIS — I119 Hypertensive heart disease without heart failure: Secondary | ICD-10-CM | POA: Insufficient documentation

## 2019-08-28 DIAGNOSIS — R9431 Abnormal electrocardiogram [ECG] [EKG]: Secondary | ICD-10-CM | POA: Insufficient documentation

## 2019-08-28 LAB — CBC
HCT: 45.9 % (ref 39.0–52.0)
Hemoglobin: 15.7 g/dL (ref 13.0–17.0)
MCH: 30.7 pg (ref 26.0–34.0)
MCHC: 34.2 g/dL (ref 30.0–36.0)
MCV: 89.6 fL (ref 80.0–100.0)
Platelets: 299 10*3/uL (ref 150–400)
RBC: 5.12 MIL/uL (ref 4.22–5.81)
RDW: 13.2 % (ref 11.5–15.5)
WBC: 11.1 10*3/uL — ABNORMAL HIGH (ref 4.0–10.5)
nRBC: 0 % (ref 0.0–0.2)

## 2019-08-28 LAB — BASIC METABOLIC PANEL
Anion gap: 10 (ref 5–15)
BUN: 20 mg/dL (ref 6–20)
CO2: 22 mmol/L (ref 22–32)
Calcium: 9.8 mg/dL (ref 8.9–10.3)
Chloride: 103 mmol/L (ref 98–111)
Creatinine, Ser: 0.81 mg/dL (ref 0.61–1.24)
GFR calc Af Amer: >60 mL/min (ref >60)
GFR calc non Af Amer: >60 mL/min (ref >60)
Glucose, Bld: 152 mg/dL — ABNORMAL HIGH (ref 70–99)
Potassium: 4.3 mmol/L (ref 3.5–5.1)
Sodium: 135 mmol/L (ref 135–145)

## 2019-08-28 NOTE — Patient Instructions (Signed)
Your procedure is scheduled on: Friday 08/31/19 Report to [place]. To find out your arrival time please call [phone] between 1PM - 3PM on Thursday 08/30/19.  Remember: Instructions that are not followed completely may result in serious medical risk, up to and including death, or upon the discretion of your surgeon and anesthesiologist your surgery may need to be rescheduled.     _X__ 1. Do not eat food after midnight the night before your procedure.                 No gum chewing or hard candies. You may drink clear liquids up to 2 hours                 before you are scheduled to arrive for your surgery- DO not drink clear                 liquids within 2 hours of the start of your surgery.                 Clear Liquids include:  water, apple juice without pulp, clear carbohydrate                 drink such as Clearfast or Gatorade, Black Coffee or Tea (Do not add                 anything to coffee or tea). Diabetics water only  __X__2.  On the morning of surgery brush your teeth with toothpaste and water, you                 may rinse your mouth with mouthwash if you wish.  Do not swallow any              toothpaste of mouthwash.     _X__ 3.  No Alcohol for 24 hours before or after surgery.   _X__ 4.  Do Not Smoke or use e-cigarettes For 24 Hours Prior to Your Surgery.                 Do not use any chewable tobacco products for at least 6 hours prior to                 surgery.  ____  5.  Bring all medications with you on the day of surgery if instructed.   __X__  6.  Notify your doctor if there is any change in your medical condition      (cold, fever, infections).     Do not wear jewelry, make-up, hairpins, clips or nail polish. Do not wear lotions, powders, or perfumes.  Do not shave 48 hours prior to surgery. Men may shave face and neck. Do not bring valuables to the hospital.    [facility] is not responsible for  any belongings or valuables.  Contacts, dentures/partials or body piercings may not be worn into surgery. Bring a case for your contacts, glasses or hearing aids, a denture cup will be supplied. Leave your suitcase in the car. After surgery it may be brought to your room. For patients admitted to the hospital, discharge time is determined by your treatment team.   Patients discharged the day of surgery will not be allowed to drive home.   Please read over the following fact sheets that you were given:   MRSA Information  __X__ Take these medicines the morning of surgery with A SIP OF WATER:  1. gabapentin (NEURONTIN  2.   3.   4.  5.  6.  ____ Fleet Enema (as directed)   __X__ Use CHG Soap/SAGE wipes as directed  ____ Use inhalers on the day of surgery  __X__ Stop metformin/Janumet/Farxiga 2 days prior to surgery    ____ Take 1/2 of usual insulin dose the night before surgery. No insulin the morning          of surgery.   ____ Stop Blood Thinners Coumadin/Plavix/Xarelto/Pleta/Pradaxa/Eliquis/Effient/Aspirin  on   Or contact your Surgeon, Cardiologist or Medical Doctor regarding  ability to stop your blood thinners  __X__ Stop Anti-inflammatories 7 days before surgery such as Advil, Ibuprofen, Motrin,  BC or Goodies Powder, Naprosyn, Naproxen, Aleve, Aspirin    __X__ Stop all herbal supplements, fish oil or vitamin E until after surgery.  STOP Glucosamine-Chondroit-Vit C  ____ Bring C-Pap to the hospital.

## 2019-08-29 LAB — SARS CORONAVIRUS 2 (TAT 6-24 HRS): SARS Coronavirus 2: NEGATIVE

## 2019-08-30 MED ORDER — CLINDAMYCIN PHOSPHATE 900 MG/50ML IV SOLN
900.0000 mg | INTRAVENOUS | Status: AC
  Administered 2019-08-31: 900 mg via INTRAVENOUS

## 2019-08-31 ENCOUNTER — Ambulatory Visit: Payer: Self-pay | Admitting: Certified Registered"

## 2019-08-31 ENCOUNTER — Other Ambulatory Visit: Payer: Self-pay

## 2019-08-31 ENCOUNTER — Encounter
Admission: RE | Disposition: A | Discharge status: Home / Self Care | Payer: Self-pay | Source: Home / Self Care | Attending: Podiatry

## 2019-08-31 ENCOUNTER — Ambulatory Visit
Admission: RE | Admit: 2019-08-31 | Discharge: 2019-08-31 | Disposition: A | Discharge status: Home / Self Care | Payer: Self-pay | Attending: Podiatry | Admitting: Podiatry

## 2019-08-31 ENCOUNTER — Encounter: Payer: Self-pay | Admitting: *Deleted

## 2019-08-31 DIAGNOSIS — F419 Anxiety disorder, unspecified: Secondary | ICD-10-CM | POA: Insufficient documentation

## 2019-08-31 DIAGNOSIS — F17200 Nicotine dependence, unspecified, uncomplicated: Secondary | ICD-10-CM | POA: Insufficient documentation

## 2019-08-31 DIAGNOSIS — E1169 Type 2 diabetes mellitus with other specified complication: Secondary | ICD-10-CM | POA: Insufficient documentation

## 2019-08-31 DIAGNOSIS — Z880 Allergy status to penicillin: Secondary | ICD-10-CM | POA: Insufficient documentation

## 2019-08-31 DIAGNOSIS — E7800 Pure hypercholesterolemia, unspecified: Secondary | ICD-10-CM | POA: Insufficient documentation

## 2019-08-31 DIAGNOSIS — M86172 Other acute osteomyelitis, left ankle and foot: Secondary | ICD-10-CM | POA: Insufficient documentation

## 2019-08-31 DIAGNOSIS — Z885 Allergy status to narcotic agent status: Secondary | ICD-10-CM | POA: Insufficient documentation

## 2019-08-31 DIAGNOSIS — I10 Essential (primary) hypertension: Secondary | ICD-10-CM | POA: Insufficient documentation

## 2019-08-31 HISTORY — PX: AMPUTATION TOE: SHX6595

## 2019-08-31 LAB — URINE DRUG SCREEN, QUALITATIVE (ARMC ONLY)
Amphetamines, Ur Screen: NOT DETECTED
Barbiturates, Ur Screen: NOT DETECTED
Benzodiazepine, Ur Scrn: NOT DETECTED
Cannabinoid 50 Ng, Ur ~~LOC~~: NOT DETECTED
Cocaine Metabolite,Ur ~~LOC~~: NOT DETECTED
MDMA (Ecstasy)Ur Screen: NOT DETECTED
Methadone Scn, Ur: NOT DETECTED
Opiate, Ur Screen: POSITIVE — AB
Phencyclidine (PCP) Ur S: NOT DETECTED
Tricyclic, Ur Screen: NOT DETECTED

## 2019-08-31 LAB — GLUCOSE, CAPILLARY
Glucose-Capillary: 184 mg/dL — ABNORMAL HIGH (ref 70–99)
Glucose-Capillary: 163 mg/dL — ABNORMAL HIGH (ref 70–99)

## 2019-08-31 SURGERY — AMPUTATION, TOE
Anesthesia: General | Site: Toe | Laterality: Left

## 2019-08-31 MED ORDER — FAMOTIDINE 20 MG PO TABS
ORAL_TABLET | ORAL | Status: AC
  Administered 2019-08-31: 09:00:00 20 mg via ORAL
  Filled 2019-08-31: qty 1

## 2019-08-31 MED ORDER — PROPOFOL 10 MG/ML IV BOLUS
INTRAVENOUS | Status: AC
  Filled 2019-08-31: qty 20

## 2019-08-31 MED ORDER — NEOMYCIN-POLYMYXIN B GU 40-200000 IR SOLN
Status: AC
  Filled 2019-08-31: qty 2

## 2019-08-31 MED ORDER — OXYCODONE HCL 5 MG PO TABS: 5.0000 mg | ORAL_TABLET | Freq: Once | ORAL | Status: DC | PRN

## 2019-08-31 MED ORDER — ACETAMINOPHEN 325 MG PO TABS: 650.0000 mg | ORAL_TABLET | ORAL | Status: DC | PRN

## 2019-08-31 MED ORDER — PROMETHAZINE HCL 25 MG/ML IJ SOLN: 6.2500 mg | INTRAMUSCULAR | Status: DC | PRN

## 2019-08-31 MED ORDER — CLINDAMYCIN PHOSPHATE 900 MG/50ML IV SOLN
INTRAVENOUS | Status: AC
  Filled 2019-08-31: qty 50

## 2019-08-31 MED ORDER — BUPIVACAINE HCL 0.5 % IJ SOLN
INTRAMUSCULAR | Status: DC | PRN
  Administered 2019-08-31: 9 mL

## 2019-08-31 MED ORDER — LIDOCAINE HCL (PF) 2 % IJ SOLN
INTRAMUSCULAR | Status: AC
  Filled 2019-08-31: qty 10

## 2019-08-31 MED ORDER — LACTATED RINGERS IV SOLN: INTRAVENOUS | Status: DC

## 2019-08-31 MED ORDER — FENTANYL CITRATE (PF) 100 MCG/2ML IJ SOLN: 25.0000 ug | INTRAMUSCULAR | Status: DC | PRN

## 2019-08-31 MED ORDER — FENTANYL CITRATE (PF) 100 MCG/2ML IJ SOLN
INTRAMUSCULAR | Status: DC | PRN
  Administered 2019-08-31: 50 ug via INTRAVENOUS

## 2019-08-31 MED ORDER — FENTANYL CITRATE (PF) 100 MCG/2ML IJ SOLN
INTRAMUSCULAR | Status: AC
  Filled 2019-08-31: qty 2

## 2019-08-31 MED ORDER — MIDAZOLAM HCL 2 MG/2ML IJ SOLN
INTRAMUSCULAR | Status: DC | PRN
  Administered 2019-08-31: 2 mg via INTRAVENOUS

## 2019-08-31 MED ORDER — LIDOCAINE HCL (CARDIAC) PF 100 MG/5ML IV SOSY
PREFILLED_SYRINGE | INTRAVENOUS | Status: DC | PRN
  Administered 2019-08-31: 100 mg via INTRAVENOUS

## 2019-08-31 MED ORDER — MIDAZOLAM HCL 2 MG/2ML IJ SOLN
INTRAMUSCULAR | Status: AC
  Filled 2019-08-31: qty 2

## 2019-08-31 MED ORDER — PROPOFOL 10 MG/ML IV BOLUS
INTRAVENOUS | Status: DC | PRN
  Administered 2019-08-31: 180 mg via INTRAVENOUS

## 2019-08-31 MED ORDER — ONDANSETRON HCL 4 MG/2ML IJ SOLN
INTRAMUSCULAR | Status: AC
  Filled 2019-08-31: qty 2

## 2019-08-31 MED ORDER — MEPERIDINE HCL 50 MG/ML IJ SOLN: 6.2500 mg | INTRAMUSCULAR | Status: DC | PRN

## 2019-08-31 MED ORDER — FAMOTIDINE 20 MG PO TABS
20.0000 mg | ORAL_TABLET | Freq: Once | ORAL | Status: AC
  Administered 2019-08-31: 09:00:00 20 mg via ORAL

## 2019-08-31 MED ORDER — SODIUM CHLORIDE 0.9 % IV SOLN
INTRAVENOUS | Status: DC
  Administered 2019-08-31: 09:00:00 via INTRAVENOUS

## 2019-08-31 MED ORDER — PHENYLEPHRINE HCL (PRESSORS) 10 MG/ML IV SOLN
INTRAVENOUS | Status: DC | PRN
  Administered 2019-08-31: 200 ug via INTRAVENOUS
  Administered 2019-08-31 (×2): 100 ug via INTRAVENOUS
  Administered 2019-08-31: 200 ug via INTRAVENOUS
  Administered 2019-08-31: 100 ug via INTRAVENOUS

## 2019-08-31 MED ORDER — CHLORHEXIDINE GLUCONATE 4 % EX LIQD: 60.0000 mL | Freq: Once | CUTANEOUS | Status: DC

## 2019-08-31 MED ORDER — OXYCODONE HCL 5 MG/5ML PO SOLN: 5.0000 mg | Freq: Once | ORAL | Status: DC | PRN

## 2019-08-31 MED ORDER — BUPIVACAINE HCL (PF) 0.5 % IJ SOLN
INTRAMUSCULAR | Status: AC
  Filled 2019-08-31: qty 30

## 2019-08-31 MED ORDER — PHENYLEPHRINE HCL (PRESSORS) 10 MG/ML IV SOLN
INTRAVENOUS | Status: AC
  Filled 2019-08-31: qty 1

## 2019-08-31 MED ORDER — HYDROCODONE-ACETAMINOPHEN 5-325 MG PO TABS: 1.0000 | ORAL_TABLET | ORAL | 0 refills | Status: DC | PRN

## 2019-08-31 MED ORDER — ONDANSETRON HCL 4 MG/2ML IJ SOLN
INTRAMUSCULAR | Status: DC | PRN
  Administered 2019-08-31: 4 mg via INTRAVENOUS

## 2019-08-31 SURGICAL SUPPLY — 44 items
BLADE MED AGGRESSIVE (BLADE) ×2 IMPLANT
BLADE OSC/SAGITTAL MD 5.5X18 (BLADE) ×2 IMPLANT
BLADE SURG 15 STRL LF DISP TIS (BLADE) ×2 IMPLANT
BLADE SURG 15 STRL SS (BLADE) ×2
BLADE SURG MINI STRL (BLADE) ×2 IMPLANT
BNDG CONFORM 2 STRL LF (GAUZE/BANDAGES/DRESSINGS) ×2 IMPLANT
BNDG ELASTIC 4X5.8 VLCR STR LF (GAUZE/BANDAGES/DRESSINGS) ×2 IMPLANT
BNDG ESMARK 4X12 TAN STRL LF (GAUZE/BANDAGES/DRESSINGS) ×2 IMPLANT
BNDG GAUZE 4.5X4.1 6PLY STRL (MISCELLANEOUS) ×2 IMPLANT
CANISTER SUCT 1200ML W/VALVE (MISCELLANEOUS) ×2 IMPLANT
COVER WAND RF STERILE (DRAPES) ×2 IMPLANT
CUFF TOURN 18 STER (MISCELLANEOUS) ×2 IMPLANT
CUFF TOURN DUAL PL 12 NO SLV (MISCELLANEOUS) ×2 IMPLANT
DRAPE FLUOR MINI C-ARM 54X84 (DRAPES) ×2 IMPLANT
DURAPREP 26ML APPLICATOR (WOUND CARE) ×2 IMPLANT
ELECT REM PT RETURN 9FT ADLT (ELECTROSURGICAL) ×2
ELECTRODE REM PT RTRN 9FT ADLT (ELECTROSURGICAL) ×1 IMPLANT
GAUZE SPONGE 4X4 12PLY STRL (GAUZE/BANDAGES/DRESSINGS) ×2 IMPLANT
GAUZE XEROFORM 1X8 LF (GAUZE/BANDAGES/DRESSINGS) ×2 IMPLANT
GLOVE BIO SURGEON STRL SZ7.5 (GLOVE) ×2 IMPLANT
GLOVE INDICATOR 8.0 STRL GRN (GLOVE) ×2 IMPLANT
GOWN STRL REUS W/ TWL LRG LVL3 (GOWN DISPOSABLE) ×1 IMPLANT
GOWN STRL REUS W/TWL LRG LVL3 (GOWN DISPOSABLE) ×1
HANDPIECE VERSAJET DEBRIDEMENT (MISCELLANEOUS) IMPLANT
KIT TURNOVER KIT A (KITS) ×2 IMPLANT
LABEL OR SOLS (LABEL) IMPLANT
NEEDLE FILTER BLUNT 18X 1/2SAF (NEEDLE) ×1
NEEDLE FILTER BLUNT 18X1 1/2 (NEEDLE) ×1 IMPLANT
NEEDLE HYPO 25X1 1.5 SAFETY (NEEDLE) ×4 IMPLANT
NS IRRIG 500ML POUR BTL (IV SOLUTION) ×2 IMPLANT
PACK EXTREMITY ARMC (MISCELLANEOUS) ×2 IMPLANT
SOL .9 NS 3000ML IRR  AL (IV SOLUTION)
SOL .9 NS 3000ML IRR UROMATIC (IV SOLUTION) IMPLANT
SOL PREP PVP 2OZ (MISCELLANEOUS) ×2
SOLUTION PREP PVP 2OZ (MISCELLANEOUS) ×1 IMPLANT
STOCKINETTE STRL 6IN 960660 (GAUZE/BANDAGES/DRESSINGS) ×2 IMPLANT
STRIP CLOSURE SKIN 1/4X4 (GAUZE/BANDAGES/DRESSINGS) ×2 IMPLANT
SUT ETHILON 3-0 FS-10 30 BLK (SUTURE)
SUT ETHILON 4-0 (SUTURE) ×1
SUT ETHILON 4-0 FS2 18XMFL BLK (SUTURE) ×1
SUTURE EHLN 3-0 FS-10 30 BLK (SUTURE) IMPLANT
SUTURE ETHLN 4-0 FS2 18XMF BLK (SUTURE) ×1 IMPLANT
SWAB DUAL CULTURE TRANS RED ST (MISCELLANEOUS) IMPLANT
SYR 10ML LL (SYRINGE) ×2 IMPLANT

## 2019-08-31 NOTE — Anesthesia Postprocedure Evaluation (Signed)
Anesthesia Post Note  Patient: Justin Landry  Procedure(s) Performed: AMPUTATION TOE  IPJ 82500 (Left Toe)  Patient location during evaluation: PACU Anesthesia Type: General Level of consciousness: awake and alert and oriented Pain management: pain level controlled Vital Signs Assessment: post-procedure vital signs reviewed and stable Respiratory status: spontaneous breathing, nonlabored ventilation and respiratory function stable Cardiovascular status: blood pressure returned to baseline and stable Postop Assessment: no signs of nausea or vomiting Anesthetic complications: no     Last Vitals:  Vitals:   08/31/19 1023 08/31/19 1034  BP: (!) 154/97 (!) 152/94  Pulse: 84   Resp: (!) 23 18  Temp: 36.6 C (!) 35.9 C  SpO2: 96% 96%    Last Pain:  Vitals:   08/31/19 1034  TempSrc:   PainSc: 0-No pain                 Justin Landry

## 2019-08-31 NOTE — H&P (Signed)
Subjective: Patient presents for amputation of his left second toe due to osteomyelitis in the distal phalanx.  Objective: Pulses are palpable on the left lower extremity.  Some loss of protective threshold with a monofilament wire.  Erythema and edema with no current open ulceration at the distal tip of the second toe.  Radiographic evidence for progressive osteomyelitis in the distal phalanx of the second toe.  Assessment: Osteomyelitis left second toe.  Plan: Medical history and physical in the chart was reviewed.  Patient stable for surgery.  Proceed with amputation of left second toe.

## 2019-08-31 NOTE — Anesthesia Post-op Follow-up Note (Signed)
Anesthesia QCDR form completed.        

## 2019-08-31 NOTE — Anesthesia Preprocedure Evaluation (Signed)
Anesthesia Evaluation  Patient identified by MRN, date of birth, ID band Patient awake    Reviewed: Allergy & Precautions, NPO status , Patient's Chart, lab work & pertinent test results  History of Anesthesia Complications Negative for: history of anesthetic complications  Airway Mallampati: III  TM Distance: >3 FB Neck ROM: Full    Dental  (+) Poor Dentition   Pulmonary neg sleep apnea, neg COPD, Current SmokerPatient did not abstain from smoking.,    breath sounds clear to auscultation- rhonchi (-) wheezing      Cardiovascular hypertension, Pt. on medications (-) CAD, (-) Past MI, (-) Cardiac Stents and (-) CABG  Rhythm:Regular Rate:Normal - Systolic murmurs and - Diastolic murmurs    Neuro/Psych neg Seizures Anxiety negative neurological ROS     GI/Hepatic negative GI ROS, Neg liver ROS,   Endo/Other  diabetes, Oral Hypoglycemic Agents  Renal/GU negative Renal ROS     Musculoskeletal negative musculoskeletal ROS (+)   Abdominal (+) + obese,   Peds  Hematology negative hematology ROS (+)   Anesthesia Other Findings Past Medical History: No date: Diabetes mellitus No date: High cholesterol No date: Humerus fracture     Comment:  right No date: Hypertension No date: Inguinal hernia   Reproductive/Obstetrics                             Anesthesia Physical Anesthesia Plan  ASA: II  Anesthesia Plan: General   Post-op Pain Management:    Induction: Intravenous  PONV Risk Score and Plan: 0  Airway Management Planned: LMA  Additional Equipment:   Intra-op Plan:   Post-operative Plan:   Informed Consent: I have reviewed the patients History and Physical, chart, labs and discussed the procedure including the risks, benefits and alternatives for the proposed anesthesia with the patient or authorized representative who has indicated his/her understanding and acceptance.      Dental advisory given  Plan Discussed with: CRNA and Anesthesiologist  Anesthesia Plan Comments:         Anesthesia Quick Evaluation

## 2019-08-31 NOTE — Op Note (Signed)
Date of operation: 08/31/2019.  Surgeon: Durward Fortes D.P.M.  Preoperative diagnosis: Osteomyelitis left second toe.  Postoperative diagnosis: Same.  Procedure: Distal amputation left second toe.  Anesthesia: LMA with local.  Hemostasis: Pneumatic tourniquet left ankle 250 mmHg.  Estimated blood loss: Less than 5 cc.  Pathology: Distal left second toe.  Complications: None apparent.  Operative indications: This is a 58 year old male with recent development of osteomyelitis in his left second toe.  Decision made for amputation of the distal aspect of the toe.  Operative procedure: Patient was taken to the operating room and placed on the table in the supine position.  Following satisfactory LMA anesthesia the left second metatarsal area was anesthetized with 9 cc of 0.5% Marcaine plain.  Pneumatic tourniquet applied to the level of the left ankle and the foot was prepped and draped in the usual sterile fashion.  The foot was exsanguinated and the tourniquet inflated to 250 mmHg.     Attention was then directed to the distal aspect of the left second toe where a fishmouth type incision was made coursing medial to lateral just distal to the proximal inner phalangeal joint.  Dissection carried down to the level of the joint where the toe was disarticulated and removed in toto.  The wound was flushed with copious amounts of sterile saline and closed using 4-0 nylon simple interrupted sutures.  Xeroform 4 x 4's con form applied to the left lower extremity.  Tourniquet was released and blood flow noted to return to the remaining digits.  Kerlix and an Ace wrap applied to the left lower extremity.  Patient tolerated the procedure and anesthesia well and was transported to the PACU with vital signs stable and in good condition.

## 2019-08-31 NOTE — Discharge Instructions (Addendum)
1.  Elevate left lower extremity. 2.  Keep the bandage on the left foot clean, dry, and do not remove.  3.  Sponge bathe only left lower extremity.  4.  Wear surgical shoe on the left foot whenever walking or standing.  5.  Take 1 pain pill every 4 hours only if needed for pain.  AMBULATORY SURGERY  DISCHARGE INSTRUCTIONS   1) The drugs that you were given will stay in your system until tomorrow so for the next 24 hours you should not:  A) Drive an automobile B) Make any legal decisions C) Drink any alcoholic beverage   2) You may resume regular meals tomorrow.  Today it is better to start with liquids and gradually work up to solid foods.  You may eat anything you prefer, but it is better to start with liquids, then soup and crackers, and gradually work up to solid foods.   3) Please notify your doctor immediately if you have any unusual bleeding, trouble breathing, redness and pain at the surgery site, drainage, fever, or pain not relieved by medication.    4) Additional Instructions:        Please contact your physician with any problems or Same Day Surgery at [phone], Monday through Friday 6 am to 4 pm, or [place] at [facility] number at [phone].

## 2019-08-31 NOTE — Transfer of Care (Signed)
Immediate Anesthesia Transfer of Care Note  Patient: Justin Landry  Procedure(s) Performed: AMPUTATION TOE  IPJ 81448 (Left Toe)  Patient Location: PACU  Anesthesia Type:General  Level of Consciousness: awake  Airway & Oxygen Therapy: Patient Spontanous Breathing and Patient connected to face mask oxygen  Post-op Assessment: Report given to RN and Post -op Vital signs reviewed and stable  Post vital signs: stable  Last Vitals:  Vitals Value Taken Time  BP 137/80 08/31/19 0958  Temp 36.4 C 08/31/19 0958  Pulse 79 08/31/19 1000  Resp 19 08/31/19 1000  SpO2 99 % 08/31/19 1000  Vitals shown include unvalidated device data.  Last Pain:  Vitals:   08/31/19 0958  TempSrc:   PainSc: 0-No pain         Complications: No apparent anesthesia complications

## 2019-08-31 NOTE — Interval H&P Note (Signed)
History and Physical Interval Note:  08/31/2019 8:46 AM  Justin Landry  has presented today for surgery, with the diagnosis of M86.172, L03.032, L02.612.  The various methods of treatment have been discussed with the patient and family. After consideration of risks, benefits and other options for treatment, the patient has consented to  Procedure(s): AMPUTATION TOE  IPJ 62130 (Left) as a surgical intervention.  The patient's history has been reviewed, patient examined, no change in status, stable for surgery.  I have reviewed the patient's chart and labs.  Questions were answered to the patient's satisfaction.     Durward Fortes

## 2019-08-31 NOTE — Anesthesia Procedure Notes (Signed)
Procedure Name: LMA Insertion Date/Time: 08/31/2019 9:15 AM Performed by: Lavone Orn, CRNA Pre-anesthesia Checklist: Patient identified, Emergency Drugs available, Suction available, Patient being monitored and Timeout performed Patient Re-evaluated:Patient Re-evaluated prior to induction Oxygen Delivery Method: Circle system utilized Preoxygenation: Pre-oxygenation with 100% oxygen Induction Type: IV induction Ventilation: Mask ventilation without difficulty LMA: LMA inserted LMA Size: 5.0 Number of attempts: 1 Placement Confirmation: positive ETCO2 and breath sounds checked- equal and bilateral Tube secured with: Tape Dental Injury: Teeth and Oropharynx as per pre-operative assessment

## 2019-09-03 LAB — SURGICAL PATHOLOGY

## 2019-09-06 ENCOUNTER — Other Ambulatory Visit: Payer: Self-pay | Admitting: Gerontology

## 2019-09-06 DIAGNOSIS — M25511 Pain in right shoulder: Secondary | ICD-10-CM

## 2019-09-11 ENCOUNTER — Ambulatory Visit: Payer: Self-pay | Admitting: Gerontology

## 2019-09-26 ENCOUNTER — Encounter: Payer: Self-pay | Admitting: Orthopedic Surgery

## 2019-09-26 ENCOUNTER — Ambulatory Visit (INDEPENDENT_AMBULATORY_CARE_PROVIDER_SITE_OTHER): Payer: Self-pay | Admitting: Orthopedic Surgery

## 2019-09-26 ENCOUNTER — Other Ambulatory Visit: Payer: Self-pay

## 2019-09-26 VITALS — Ht 67.0 in | Wt 210.0 lb

## 2019-09-26 DIAGNOSIS — M12811 Other specific arthropathies, not elsewhere classified, right shoulder: Secondary | ICD-10-CM

## 2019-09-26 NOTE — Progress Notes (Signed)
Office Visit Note   Patient: Justin Landry           Date of Birth: [birth year]           MRN: [id number] Visit Date: 09/26/2019 Requested by: Langston Reusing, NP [place],  [place] [address] PCP: Langston Reusing, NP  Subjective: Chief Complaint  Patient presents with   Right Shoulder - Pain    HPI: Justin Landry is a 58 y.o. male who presents to the office complaining of right shoulder pain.  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  Since then patient notes chronic right shoulder pain and weakness.  Patient wakes at night with pain.  His pain is worse with overhead motion and with sleeping on that side.  Weakness and pain are equal complaints for him.  He has a long history of work and physical job.  He is on disability right now but wants to return to work in some fashion eventually.  He takes tramadol, Tylenol, Motrin without relief from pain.  Patient has a history of type 2 diabetes and is unsure of his most recent A1c.  Patient notes his brother has a history of blood clots.  He also notes history of alcoholism and smoking.  Patient is homeless and staying with a friend currently.              ROS:  All systems reviewed are negative as they relate to the chief complaint within the history of present illness.  Patient denies fevers or chills.  Assessment & Plan: Visit Diagnoses:  1. Chronic right shoulder pain     Plan: Patient is a 58 year old male who presents with 3-year history of right shoulder pain and weakness.  X-rays reviewed which reveal rotator cuff arthropathy as evidenced by high riding humerus and reduced acromiohumeral distance.  Patient is not a candidate for lower trapezius tendon transfer due to this superior migration of the humerus and significant arthritis in the shoulder..  The only potential relief for  patient's symptoms surgically would be reverse shoulder arthroplasty.  However patient was made aware that the surgery cannot easily, if at all, be revised.  This is an issue for a 58 year old patient.  Discussed the risk and benefits of the procedure.  Patient wants to proceed.  Ordered hemoglobin A1c for evaluation of patient's type 2 diabetes.  Ordered thin cut CT scan of the right shoulder for preoperative planning purposes.  The risk and benefits of the procedure including but not limited to infection nerve and vessel damage incomplete pain relief as well as the lifting restriction which would be required are all discussed.  He has end-stage rotator cuff arthropathy and arthritis and significant pain in the shoulder.  I think this procedure has the chance of giving him good pain relief and some improved function but there is a high likelihood of revision in his lifetime which may or may not be possible depending on bone stock.  Follow-Up Instructions: No follow-ups on file.   Orders:  Orders Placed This Encounter  Procedures   CT SHOULDER RIGHT WO CONTRAST   HgB A1c   No orders of the defined types were placed in this encounter.     Procedures: No procedures performed   Clinical Data: No additional findings.  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 5/5 grip strength, forearm pronation/supination, and bicep strength  Specialty Comments:  No specialty comments available.  Imaging: No results found.   PMFS History: Patient Active Problem List   Diagnosis Date Noted   Diabetic ulcer of toe of left foot associated with diabetes mellitus due to underlying condition (HCC) 08/23/2019   History of right inguinal hernia 08/14/2019   Encounter to establish care 08/14/2019   Anxiety 08/14/2019   Right shoulder pain 08/14/2019   Cellulitis 06/22/2019   Essential hypertension 05/27/2015   Type 2 diabetes mellitus without complication (HCC) 05/27/2015   Inguinal hernia, bilateral  04/10/2014   Past Medical History:  Diagnosis Date   Diabetes mellitus    High cholesterol    Humerus fracture    right   Hypertension    Inguinal hernia     Family History  Problem Relation Age of Onset   Diabetes Mother    Hypertension Mother    Diabetes Father    Hypertension Father    Cancer Brother        brain    Past Surgical History:  Procedure Laterality Date   AMPUTATION TOE Left 06/23/2019   Procedure: AMPUTATION TOE LEFT AMPUTATION;  Surgeon: Linus Galas, DPM;  Location: [facility] ORS;  Service: Podiatry;  Laterality: Left;   AMPUTATION TOE Left 08/31/2019   Procedure: AMPUTATION TOE  IPJ 00174;  Surgeon: Linus Galas, DPM;  Location: [facility] ORS;  Service: Podiatry;  Laterality: Left;   CYSTOSCOPY  2015   Biopsy   HERNIA REPAIR     Social History   Occupational History   Not on file  Tobacco Use   Smoking status: Current Every Day Smoker    Packs/day: 1.00    Types: Cigarettes   Smokeless tobacco: Never Used  Substance and Sexual Activity   Alcohol use: Yes    Alcohol/week: 2.0 standard drinks    Types: 2 Cans of beer per week    Comment: occasional   Drug use: Yes    Types: Marijuana   Sexual activity: Yes

## 2019-09-27 LAB — HEMOGLOBIN A1C
Hgb A1c MFr Bld: 8.6 % of total Hgb — ABNORMAL HIGH (ref <5.7)
Mean Plasma Glucose: 200 (calc)
eAG (mmol/L): 11.1 (calc)

## 2019-09-28 ENCOUNTER — Encounter: Payer: Self-pay | Admitting: Orthopedic Surgery

## 2019-09-28 ENCOUNTER — Other Ambulatory Visit: Payer: Self-pay | Admitting: Surgical

## 2019-09-28 ENCOUNTER — Telehealth: Payer: Self-pay | Admitting: Orthopedic Surgery

## 2019-09-28 MED ORDER — TRAMADOL HCL 50 MG PO TABS: 50.0000 mg | ORAL_TABLET | Freq: Every day | ORAL | 0 refills | Status: DC | PRN

## 2019-09-28 NOTE — Telephone Encounter (Signed)
FYI - Patient refused medication because tramadol has not helped in the past, stated he will just wait until after surgery.

## 2019-09-28 NOTE — Telephone Encounter (Signed)
Patient called. He would like pain meds called in to [facility].

## 2019-09-28 NOTE — Telephone Encounter (Signed)
Please advise 

## 2019-10-01 NOTE — Progress Notes (Signed)
Needs to be 8 or less thx

## 2019-10-02 ENCOUNTER — Ambulatory Visit: Payer: Self-pay

## 2019-10-02 ENCOUNTER — Other Ambulatory Visit: Payer: Self-pay

## 2019-10-03 ENCOUNTER — Telehealth: Payer: Self-pay | Admitting: Orthopedic Surgery

## 2019-10-03 ENCOUNTER — Other Ambulatory Visit: Payer: Self-pay

## 2019-10-03 ENCOUNTER — Encounter: Payer: Self-pay | Admitting: *Deleted

## 2019-10-03 DIAGNOSIS — E119 Type 2 diabetes mellitus without complications: Secondary | ICD-10-CM

## 2019-10-03 DIAGNOSIS — M12811 Other specific arthropathies, not elsewhere classified, right shoulder: Secondary | ICD-10-CM

## 2019-10-03 NOTE — Telephone Encounter (Signed)
Patient left message on voicemail to call him at [phone] to discuss surgery for right shoulder.  Call returned, no answer, and patient's voicemail is full.

## 2019-10-03 NOTE — Telephone Encounter (Signed)
Still has CT scan pending.

## 2019-10-04 ENCOUNTER — Ambulatory Visit: Payer: Self-pay | Admitting: Gerontology

## 2019-10-04 ENCOUNTER — Encounter: Payer: Self-pay | Admitting: Gerontology

## 2019-10-04 ENCOUNTER — Other Ambulatory Visit: Payer: Self-pay

## 2019-10-04 VITALS — BP 168/98 | HR 85 | Ht 67.0 in | Wt 212.0 lb

## 2019-10-04 DIAGNOSIS — Z0000 Encounter for general adult medical examination without abnormal findings: Secondary | ICD-10-CM

## 2019-10-04 DIAGNOSIS — E785 Hyperlipidemia, unspecified: Secondary | ICD-10-CM

## 2019-10-04 DIAGNOSIS — M25511 Pain in right shoulder: Secondary | ICD-10-CM

## 2019-10-04 DIAGNOSIS — I10 Essential (primary) hypertension: Secondary | ICD-10-CM

## 2019-10-04 DIAGNOSIS — E119 Type 2 diabetes mellitus without complications: Secondary | ICD-10-CM

## 2019-10-04 DIAGNOSIS — G8929 Other chronic pain: Secondary | ICD-10-CM

## 2019-10-04 LAB — COMPREHENSIVE METABOLIC PANEL
ALT: 31 IU/L (ref 0–44)
AST: 21 IU/L (ref 0–40)
Albumin/Globulin Ratio: 1.6 (ref 1.2–2.2)
Albumin: 4.5 g/dL (ref 3.8–4.9)
Alkaline Phosphatase: 81 IU/L (ref 39–117)
BUN/Creatinine Ratio: 12 (ref 9–20)
BUN: 11 mg/dL (ref 6–24)
Bilirubin Total: 0.3 mg/dL (ref 0.0–1.2)
CO2: 22 mmol/L (ref 20–29)
Calcium: 9.8 mg/dL (ref 8.7–10.2)
Chloride: 99 mmol/L (ref 96–106)
Creatinine, Ser: 0.94 mg/dL (ref 0.76–1.27)
GFR calc Af Amer: 104 mL/min/{1.73_m2} (ref >59)
GFR calc non Af Amer: 90 mL/min/{1.73_m2} (ref >59)
Globulin, Total: 2.8 g/dL (ref 1.5–4.5)
Glucose: 229 mg/dL — ABNORMAL HIGH (ref 65–99)
Potassium: 4.8 mmol/L (ref 3.5–5.2)
Sodium: 134 mmol/L (ref 134–144)
Total Protein: 7.3 g/dL (ref 6.0–8.5)

## 2019-10-04 LAB — LIPID PANEL
Chol/HDL Ratio: 6.5 ratio — ABNORMAL HIGH (ref 0.0–5.0)
Cholesterol, Total: 207 mg/dL — ABNORMAL HIGH (ref 100–199)
HDL: 32 mg/dL — ABNORMAL LOW (ref >39)
LDL Chol Calc (NIH): 99 mg/dL (ref 0–99)
Triglycerides: 453 mg/dL — ABNORMAL HIGH (ref 0–149)
VLDL Cholesterol Cal: 76 mg/dL — ABNORMAL HIGH (ref 5–40)

## 2019-10-04 LAB — HEMOGLOBIN A1C
Est. average glucose Bld gHb Est-mCnc: 206 mg/dL
Hgb A1c MFr Bld: 8.8 % — ABNORMAL HIGH (ref 4.8–5.6)

## 2019-10-04 MED ORDER — METFORMIN HCL 1000 MG PO TABS: 1000.0000 mg | ORAL_TABLET | Freq: Two times a day (BID) | ORAL | 1 refills | Status: DC

## 2019-10-04 MED ORDER — BLOOD PRESSURE KIT: 1.0000 | PACK | Freq: Every day | 0 refills | Status: DC

## 2019-10-04 MED ORDER — AMLODIPINE BESYLATE 5 MG PO TABS: 5.0000 mg | ORAL_TABLET | Freq: Every day | ORAL | 0 refills | Status: DC

## 2019-10-04 MED ORDER — GLIPIZIDE 5 MG PO TABS: 2.5000 mg | ORAL_TABLET | Freq: Every day | ORAL | 0 refills | Status: DC

## 2019-10-04 NOTE — Patient Instructions (Signed)
Carbohydrate Counting for Diabetes Mellitus, Adult  Carbohydrate counting is a method of keeping track of how many carbohydrates you eat. Eating carbohydrates naturally increases the amount of sugar (glucose) in the blood. Counting how many carbohydrates you eat helps keep your blood glucose within normal limits, which helps you manage your diabetes (diabetes mellitus). It is important to know how many carbohydrates you can safely have in each meal. This is different for every person. A diet and nutrition specialist (registered dietitian) can help you make a meal plan and calculate how many carbohydrates you should have at each meal and snack. Carbohydrates are found in the following foods:  Grains, such as breads and cereals.  Dried beans and soy products.  Starchy vegetables, such as potatoes, peas, and corn.  Fruit and fruit juices.  Milk and yogurt.  Sweets and snack foods, such as cake, cookies, candy, chips, and soft drinks. How do I count carbohydrates? There are two ways to count carbohydrates in food. You can use either of the methods or a combination of both. Reading "Nutrition Facts" on packaged food The "Nutrition Facts" list is included on the labels of almost all packaged foods and beverages in the [place] It includes:  The serving size.  Information about nutrients in each serving, including the grams (g) of carbohydrate per serving. To use the "Nutrition Facts":  Decide how many servings you will have.  Multiply the number of servings by the number of carbohydrates per serving.  The resulting number is the total amount of carbohydrates that you will be having. Learning standard serving sizes of other foods When you eat carbohydrate foods that are not packaged or do not include "Nutrition Facts" on the label, you need to measure the servings in order to count the amount of carbohydrates:  Measure the foods that you will eat with a food scale or measuring cup, if needed.   Decide how many standard-size servings you will eat.  Multiply the number of servings by 15. Most carbohydrate-rich foods have about 15 g of carbohydrates per serving. ? For example, if you eat 8 oz (170 g) of strawberries, you will have eaten 2 servings and 30 g of carbohydrates (2 servings x 15 g = 30 g).  For foods that have more than one food mixed, such as soups and casseroles, you must count the carbohydrates in each food that is included. The following list contains standard serving sizes of common carbohydrate-rich foods. Each of these servings has about 15 g of carbohydrates:   hamburger bun or  English muffin.   oz (15 mL) syrup.   oz (14 g) jelly.  1 slice of bread.  1 six-inch tortilla.  3 oz (85 g) cooked rice or pasta.  4 oz (113 g) cooked dried beans.  4 oz (113 g) starchy vegetable, such as peas, corn, or potatoes.  4 oz (113 g) hot cereal.  4 oz (113 g) mashed potatoes or  of a large baked potato.  4 oz (113 g) canned or frozen fruit.  4 oz (120 mL) fruit juice.  4-6 crackers.  6 chicken nuggets.  6 oz (170 g) unsweetened dry cereal.  6 oz (170 g) plain fat-free yogurt or yogurt sweetened with artificial sweeteners.  8 oz (240 mL) milk.  8 oz (170 g) fresh fruit or one small piece of fruit.  24 oz (680 g) popped popcorn. Example of carbohydrate counting Sample meal  3 oz (85 g) chicken breast.  6 oz (170 g)   brown rice.  4 oz (113 g) corn.  8 oz (240 mL) milk.  8 oz (170 g) strawberries with sugar-free whipped topping. Carbohydrate calculation 1. Identify the foods that contain carbohydrates: ? Rice. ? Corn. ? Milk. ? Strawberries. 2. Calculate how many servings you have of each food: ? 2 servings rice. ? 1 serving corn. ? 1 serving milk. ? 1 serving strawberries. 3. Multiply each number of servings by 15 g: ? 2 servings rice x 15 g = 30 g. ? 1 serving corn x 15 g = 15 g. ? 1 serving milk x 15 g = 15 g. ? 1 serving  strawberries x 15 g = 15 g. 4. Add together all of the amounts to find the total grams of carbohydrates eaten: ? 30 g + 15 g + 15 g + 15 g = 75 g of carbohydrates total. Summary  Carbohydrate counting is a method of keeping track of how many carbohydrates you eat.  Eating carbohydrates naturally increases the amount of sugar (glucose) in the blood.  Counting how many carbohydrates you eat helps keep your blood glucose within normal limits, which helps you manage your diabetes.  A diet and nutrition specialist (registered dietitian) can help you make a meal plan and calculate how many carbohydrates you should have at each meal and snack. This information is not intended to replace advice given to you by your health care provider. Make sure you discuss any questions you have with your health care provider. Document Released: 11/29/2005 Document Revised: 06/23/2017 Document Reviewed: 05/12/2016 Elsevier Patient Education  2020 [employer] DASH Eating Plan DASH stands for "Dietary Approaches to Stop Hypertension." The DASH eating plan is a healthy eating plan that has been shown to reduce high blood pressure (hypertension). It may also reduce your risk for type 2 diabetes, heart disease, and stroke. The DASH eating plan may also help with weight loss. What are tips for following this plan?  General guidelines  Avoid eating more than 2,300 mg (milligrams) of salt (sodium) a day. If you have hypertension, you may need to reduce your sodium intake to 1,500 mg a day.  Limit alcohol intake to no more than 1 drink a day for nonpregnant women and 2 drinks a day for men. One drink equals 12 oz of beer, 5 oz of wine, or 1 oz of hard liquor.  Work with your health care provider to maintain a healthy body weight or to lose weight. Ask what an ideal weight is for you.  Get at least 30 minutes of exercise that causes your heart to beat faster (aerobic exercise) most days of the week. Activities may  include walking, swimming, or biking.  Work with your health care provider or diet and nutrition specialist (dietitian) to adjust your eating plan to your individual calorie needs. Reading food labels   Check food labels for the amount of sodium per serving. Choose foods with less than 5 percent of the Daily Value of sodium. Generally, foods with less than 300 mg of sodium per serving fit into this eating plan.  To find whole grains, look for the word "whole" as the first word in the ingredient list. Shopping  Buy products labeled as "low-sodium" or "no salt added."  Buy fresh foods. Avoid canned foods and premade or frozen meals. Cooking  Avoid adding salt when cooking. Use salt-free seasonings or herbs instead of table salt or sea salt. Check with your health care provider or pharmacist before using salt substitutes.    Do not fry foods. Cook foods using healthy methods such as baking, boiling, grilling, and broiling instead.  Cook with heart-healthy oils, such as olive, canola, soybean, or sunflower oil. Meal planning  Eat a balanced diet that includes: ? 5 or more servings of fruits and vegetables each day. At each meal, try to fill half of your plate with fruits and vegetables. ? Up to 6-8 servings of whole grains each day. ? Less than 6 oz of lean meat, poultry, or fish each day. A 3-oz serving of meat is about the same size as a deck of cards. One egg equals 1 oz. ? 2 servings of low-fat dairy each day. ? A serving of nuts, seeds, or beans 5 times each week. ? Heart-healthy fats. Healthy fats called Omega-3 fatty acids are found in foods such as flaxseeds and coldwater fish, like sardines, salmon, and mackerel.  Limit how much you eat of the following: ? Canned or prepackaged foods. ? Food that is high in trans fat, such as fried foods. ? Food that is high in saturated fat, such as fatty meat. ? Sweets, desserts, sugary drinks, and other foods with added sugar. ? Full-fat  dairy products.  Do not salt foods before eating.  Try to eat at least 2 vegetarian meals each week.  Eat more home-cooked food and less restaurant, buffet, and fast food.  When eating at a restaurant, ask that your food be prepared with less salt or no salt, if possible. What foods are recommended? The items listed may not be a complete list. Talk with your dietitian about what dietary choices are best for you. Grains Whole-grain or whole-wheat bread. Whole-grain or whole-wheat pasta. Brown rice. Oatmeal. Quinoa. Bulgur. Whole-grain and low-sodium cereals. Pita bread. Low-fat, low-sodium crackers. Whole-wheat flour tortillas. Vegetables Fresh or frozen vegetables (raw, steamed, roasted, or grilled). Low-sodium or reduced-sodium tomato and vegetable juice. Low-sodium or reduced-sodium tomato sauce and tomato paste. Low-sodium or reduced-sodium canned vegetables. Fruits All fresh, dried, or frozen fruit. Canned fruit in natural juice (without added sugar). Meat and other protein foods Skinless chicken or turkey. Ground chicken or turkey. Pork with fat trimmed off. Fish and seafood. Egg whites. Dried beans, peas, or lentils. Unsalted nuts, nut butters, and seeds. Unsalted canned beans. Lean cuts of beef with fat trimmed off. Low-sodium, lean deli meat. Dairy Low-fat (1%) or fat-free (skim) milk. Fat-free, low-fat, or reduced-fat cheeses. Nonfat, low-sodium ricotta or cottage cheese. Low-fat or nonfat yogurt. Low-fat, low-sodium cheese. Fats and oils Soft margarine without trans fats. Vegetable oil. Low-fat, reduced-fat, or light mayonnaise and salad dressings (reduced-sodium). Canola, safflower, olive, soybean, and sunflower oils. Avocado. Seasoning and other foods Herbs. Spices. Seasoning mixes without salt. Unsalted popcorn and pretzels. Fat-free sweets. What foods are not recommended? The items listed may not be a complete list. Talk with your dietitian about what dietary choices are best  for you. Grains Baked goods made with fat, such as croissants, muffins, or some breads. Dry pasta or rice meal packs. Vegetables Creamed or fried vegetables. Vegetables in a cheese sauce. Regular canned vegetables (not low-sodium or reduced-sodium). Regular canned tomato sauce and paste (not low-sodium or reduced-sodium). Regular tomato and vegetable juice (not low-sodium or reduced-sodium). Pickles. Olives. Fruits Canned fruit in a light or heavy syrup. Fried fruit. Fruit in cream or butter sauce. Meat and other protein foods Fatty cuts of meat. Ribs. Fried meat. Bacon. Sausage. Bologna and other processed lunch meats. Salami. Fatback. Hotdogs. Bratwurst. Salted nuts and seeds. Canned beans with   added salt. Canned or smoked fish. Whole eggs or egg yolks. Chicken or turkey with skin. Dairy Whole or 2% milk, cream, and half-and-half. Whole or full-fat cream cheese. Whole-fat or sweetened yogurt. Full-fat cheese. Nondairy creamers. Whipped toppings. Processed cheese and cheese spreads. Fats and oils Butter. Stick margarine. Lard. Shortening. Ghee. Bacon fat. Tropical oils, such as coconut, palm kernel, or palm oil. Seasoning and other foods Salted popcorn and pretzels. Onion salt, garlic salt, seasoned salt, table salt, and sea salt. Worcestershire sauce. Tartar sauce. Barbecue sauce. Teriyaki sauce. Soy sauce, including reduced-sodium. Steak sauce. Canned and packaged gravies. Fish sauce. Oyster sauce. Cocktail sauce. Horseradish that you find on the shelf. Ketchup. Mustard. Meat flavorings and tenderizers. Bouillon cubes. Hot sauce and Tabasco sauce. Premade or packaged marinades. Premade or packaged taco seasonings. Relishes. Regular salad dressings. Where to find more information:  National Heart, Lung, and Blood Institute: www.nhlbi.nih.gov  American Heart Association: www.heart.org Summary  The DASH eating plan is a healthy eating plan that has been shown to reduce high blood pressure  (hypertension). It may also reduce your risk for type 2 diabetes, heart disease, and stroke.  With the DASH eating plan, you should limit salt (sodium) intake to 2,300 mg a day. If you have hypertension, you may need to reduce your sodium intake to 1,500 mg a day.  When on the DASH eating plan, aim to eat more fresh fruits and vegetables, whole grains, lean proteins, low-fat dairy, and heart-healthy fats.  Work with your health care provider or diet and nutrition specialist (dietitian) to adjust your eating plan to your individual calorie needs. This information is not intended to replace advice given to you by your health care provider. Make sure you discuss any questions you have with your health care provider. Document Released: 11/18/2011 Document Revised: 11/11/2017 Document Reviewed: 11/22/2016 Elsevier Patient Education  2020 Elsevier Inc.  

## 2019-10-04 NOTE — Progress Notes (Signed)
Established Patient Office Visit  Subjective:  Patient ID: Justin Landry, male    DOB: October 13, 1961  Age: 58 y.o. MRN: [id number]  CC: No chief complaint on file.   HPI Justin Landry presents for follow up of type 2 diabetes, right shoulder pain and lab review.  His HgbA1c done yesterday was 8.8%, he reports that he checks his fasting blood glucose daily and it was 156 mg/dl, unable to adhere totally to diabetic diet because he's homeless and stays with his friend. He denies hypoglycemic symptoms, but endorses polyuria, polyphagia and polydipsia. He performs daily foot checks. His blood pressure was elevated during visit, he doesn't monitor his blood pressure at home . He continues to smoke less than 1 pack of cigarette daily and admits the desire to quit. He states that he's compliant with his medications and adheres to low salt diet and exercises as tolerated.   He was discharged from hospital after distal amputation of second left toe on 08/31/2019, and he states that it has healed up and will follow up with Dr. Cleda Mccreedy T.He was seen by the Orthopedic Dr Bonnell Public on 09/26/2019 for right shoulder pain and was diagnosed with Rotator cuff arthropathy. He's yet to schedule CT scan to right shoulder and surgery. He reports that he continues to experience pain to right shoulder and wears right arm sling. His Lipid panel done on 10/03/2019, Total cholesterol was 207, Triglycerides 453, HDL 32 and LDL was 99 mg/dl. He reports that he's doing well and offers no further concerns.   Past Medical History:  Diagnosis Date  . Diabetes mellitus   . High cholesterol   . Humerus fracture    right  . Hypertension   . Inguinal hernia     Past Surgical History:  Procedure Laterality Date  . AMPUTATION TOE Left 06/23/2019   Procedure: AMPUTATION TOE LEFT AMPUTATION;  Surgeon: Sharlotte Alamo, DPM;  Location: [facility] ORS;  Service: Podiatry;  Laterality: Left;  . AMPUTATION TOE Left 08/31/2019   Procedure: AMPUTATION TOE  IPJ [id number];  Surgeon: Sharlotte Alamo, DPM;  Location: [facility] ORS;  Service: Podiatry;  Laterality: Left;  . CYSTOSCOPY  2015   Biopsy  . HERNIA REPAIR      Family History  Problem Relation Age of Onset  . Diabetes Mother   . Hypertension Mother   . Diabetes Father   . Hypertension Father   . Cancer Brother        brain    Social History   Socioeconomic History  . Marital status: Single    Spouse name: Not on file  . Number of children: Not on file  . Years of education: Not on file  . Highest education level: Not on file  Occupational History  . Not on file  Social Needs  . Financial resource strain: Not on file  . Food insecurity    Worry: Not on file    Inability: Not on file  . Transportation needs    Medical: Not on file    Non-medical: Not on file  Tobacco Use  . Smoking status: Current Every Day Smoker    Packs/day: 1.00    Types: Cigarettes  . Smokeless tobacco: Never Used  Substance and Sexual Activity  . Alcohol use: Yes    Alcohol/week: 2.0 standard drinks    Types: 2 Cans of beer per week    Comment: occasional  . Drug use: Yes    Types: Marijuana  . Sexual activity: Yes  Lifestyle  . Physical activity    Days per week: Not on file    Minutes per session: Not on file  . Stress: Not on file  Relationships  . Social Herbalist on phone: Not on file    Gets together: Not on file    Attends religious service: Not on file    Active member of club or organization: Not on file    Attends meetings of clubs or organizations: Not on file    Relationship status: Not on file  . Intimate partner violence    Fear of current or ex partner: Not on file    Emotionally abused: Not on file    Physically abused: Not on file    Forced sexual activity: Not on file  Other Topics Concern  . Not on file  Social History Narrative  . Not on file    Outpatient Medications Prior to Visit  Medication Sig Dispense Refill  .  Cyanocobalamin (VITAMIN B 12 PO) Take 1 tablet by mouth daily.    . Fish Oil-Cholecalciferol (FISH OIL + D3) 1000-1000 MG-UNIT CAPS Take 1 capsule by mouth daily with breakfast.     . gabapentin (NEURONTIN) 400 MG capsule Take 1 capsule (400 mg total) by mouth 3 (three) times daily. 90 capsule 1  . Glucosamine-Chondroit-Vit C-Mn (GLUCOSAMINE 1500 COMPLEX PO) Take 1 tablet by mouth daily.    Marland Kitchen lisinopril (ZESTRIL) 20 MG tablet Take 2 tablets (40 mg total) by mouth daily. 60 tablet 1  . HYDROcodone-acetaminophen (NORCO) 5-325 MG tablet Take 1 tablet by mouth every 4 (four) hours as needed for moderate pain. 20 tablet 0  . metFORMIN (GLUCOPHAGE) 1000 MG tablet Take 1 tablet (1,000 mg total) by mouth 2 (two) times daily with a meal. 60 tablet 1  . traMADol (ULTRAM) 50 MG tablet Take 1 tablet (50 mg total) by mouth daily as needed. 30 tablet 0   No facility-administered medications prior to visit.     Allergies  Allergen Reactions  . Morphine And Related Nausea And Vomiting and Other (See Comments)    Extreme sweating   . Oxycontin [Oxycodone Hcl] Other (See Comments)    Extreme NAusea and vomitting follows (Patient Tolerates Percocet short acting)  . Penicillins Nausea And Vomiting    Has patient had a PCN reaction causing immediate rash, facial/tongue/throat swelling, SOB or lightheadedness with hypotension: No Has patient had a PCN reaction causing severe rash involving mucus membranes or skin necrosis: No Has patient had a PCN reaction that required hospitalization No Has patient had a PCN reaction occurring within the last 10 years: No If all of the above answers are "NO", then may proceed with Cephalosporin use.     ROS Review of Systems  Constitutional: Negative.   Eyes: Negative.   Respiratory: Negative.   Cardiovascular: Negative.   Endocrine: Positive for polydipsia, polyphagia and polyuria.  Genitourinary: Negative.   Musculoskeletal: Positive for arthralgias (chronic right  shoulder pain).  Skin: Negative.   Neurological: Negative.   Psychiatric/Behavioral: Negative.       Objective:    Physical Exam  Constitutional: He is oriented to person, place, and time. He appears well-developed.  HENT:  Head: Normocephalic and atraumatic.  Eyes: Pupils are equal, round, and reactive to light. EOM are normal.  Cardiovascular: Normal rate and regular rhythm.  Pulmonary/Chest: Effort normal and breath sounds normal.  Abdominal: Soft. Bowel sounds are normal.  Musculoskeletal:        General: Tenderness (  chronic right shoulder pain) present.  Neurological: He is alert and oriented to person, place, and time.  Skin: Skin is warm and dry.  Psychiatric: He has a normal mood and affect. His behavior is normal. Judgment and thought content normal.    BP (!) 168/98 (BP Location: Right Arm, Patient Position: Sitting)   Pulse 85   Ht _0  (1.702 m)   Wt 212 lb (96.2 kg)   SpO2 98%   BMI 33.20 kg/m  Wt Readings from Last 3 Encounters:  10/04/19 212 lb (96.2 kg)  09/26/19 210 lb (95.3 kg)  08/31/19 207 lb 3.7 oz (94 kg)   He was encouraged to continue on weight loss regimen.    Health Maintenance Due  Topic Date Due  . Hepatitis C Screening  [birth year]  . PNEUMOCOCCAL POLYSACCHARIDE VACCINE AGE 45-64 HIGH RISK  10/11/1964  . OPHTHALMOLOGY EXAM  10/11/1972  . HIV Screening  10/11/1977  . COLONOSCOPY  10/11/2012  . INFLUENZA VACCINE  07/14/2019    There are no preventive care reminders to display for this patient.  No results found for: TSH Lab Results  Component Value Date   WBC 11.1 (H) 08/28/2019   HGB 15.7 08/28/2019   HCT 45.9 08/28/2019   MCV 89.6 08/28/2019   PLT 299 08/28/2019   Lab Results  Component Value Date   NA 134 10/03/2019   K 4.8 10/03/2019   CO2 22 10/03/2019   GLUCOSE 229 (H) 10/03/2019   BUN 11 10/03/2019   CREATININE 0.94 10/03/2019   BILITOT 0.3 10/03/2019   ALKPHOS 81 10/03/2019   AST 21 10/03/2019   ALT 31  10/03/2019   PROT 7.3 10/03/2019   ALBUMIN 4.5 10/03/2019   CALCIUM 9.8 10/03/2019   ANIONGAP 10 08/28/2019   GFR 87.21 05/27/2015   Lab Results  Component Value Date   CHOL 207 (H) 10/03/2019   Lab Results  Component Value Date   HDL 32 (L) 10/03/2019   Lab Results  Component Value Date   LDLCALC 99 10/03/2019   Lab Results  Component Value Date   TRIG 453 (H) 10/03/2019   Lab Results  Component Value Date   CHOLHDL 6.5 (H) 10/03/2019   Lab Results  Component Value Date   HGBA1C 8.8 (H) 10/03/2019      Assessment & Plan:    1. Type 2 diabetes mellitus without complication, without long-term current use of insulin (HCC) - Uncontrolled diabetes, his HgbA1c was 8.8%, his goal is < 7%. He will continue on metformin and start 2.5 mg glipizide, and was educated on medication side effect and advised to notify clinic. His fasting blood glucose should be between 80-130 mg/dl. -Use Diabetic diet as advised  -Check blood sugar 2 times a day, once before breakfast and dinner,write down the numbers against date in a log,bring log to clinic every visit -Take medications regularly as advised -Regular exercise - metFORMIN (GLUCOPHAGE) 1000 MG tablet; Take 1 tablet (1,000 mg total) by mouth 2 (two) times daily with a meal.  Dispense: 60 tablet; Refill: 1 - glipiZIDE (GLUCOTROL) 5 MG tablet; Take 0.5 tablets (2.5 mg total) by mouth daily before breakfast.  Dispense: 30 tablet; Refill: 0 - Endocrinology Referral  2. Essential hypertension - His blood pressure is not controlled at 168/98, his goal BP is < 140/90. He will continue on Amlodipine, was educated on side effects and advised to notify clinic. -He was encouraged on smoking cessation and was provided with [place] Quit line information - He  was advised to continue on low salt DASH diet -Take medications regularly on time -Exercise regularly as tolerated -He was provided with blood pressure machine and advised to check blood  pressure once daily at home, record and bring log to follow up visit. -Goal is less than 140/90 and normal blood pressure is 120/80 - amLODipine (NORVASC) 5 MG tablet; Take 1 tablet (5 mg total) by mouth daily.  Dispense: 30 tablet; Refill: 0 - Blood Pressure KIT; 1 kit by Does not apply route daily.  Dispense: 1 kit; Refill: 0  3. Chronic right shoulder pain - He will continue to follow up with Dr Bonnell Public.   5. Health care maintenance  - Flu Vaccine QUAD 6+ mos PF IM (Fluarix Quad PF) was administered today.  6: Elevated Lipids - He was advised to continue on low fat/low cholesterol diet. -Low fat Diet, like low fat dairy products eg skimmed milk -Avoid any fried food -Regular exercise/walk -Goal for Total Cholesterol is less than 200 -Goal for bad cholesterol LDL is less than 70 -Goal for Good cholesterol HDL is more than 45 -Goal for Triglyceride is less than 150     Follow-up: Return in about 1 month (around 11/04/2019), or if symptoms worsen or fail to improve.    Christyn Gutkowski Jerold Coombe, NP

## 2019-10-09 ENCOUNTER — Other Ambulatory Visit: Payer: Self-pay

## 2019-10-09 ENCOUNTER — Ambulatory Visit
Admission: RE | Admit: 2019-10-09 | Discharge: 2019-10-09 | Disposition: A | Discharge status: Home / Self Care | Payer: No Typology Code available for payment source | Source: Ambulatory Visit | Attending: Orthopedic Surgery | Admitting: Orthopedic Surgery

## 2019-10-09 DIAGNOSIS — M12811 Other specific arthropathies, not elsewhere classified, right shoulder: Secondary | ICD-10-CM

## 2019-10-11 ENCOUNTER — Other Ambulatory Visit: Payer: Self-pay | Admitting: Gerontology

## 2019-10-11 DIAGNOSIS — M25511 Pain in right shoulder: Secondary | ICD-10-CM

## 2019-10-24 ENCOUNTER — Other Ambulatory Visit: Payer: Self-pay | Admitting: Gerontology

## 2019-10-24 DIAGNOSIS — I10 Essential (primary) hypertension: Secondary | ICD-10-CM

## 2019-10-24 DIAGNOSIS — E119 Type 2 diabetes mellitus without complications: Secondary | ICD-10-CM

## 2019-10-30 ENCOUNTER — Ambulatory Visit: Payer: Self-pay

## 2019-10-30 ENCOUNTER — Other Ambulatory Visit: Payer: Self-pay

## 2019-10-30 NOTE — Progress Notes (Unsigned)
Patient scheduled to see [facility] on 10/30/2019. Unable to reach patient via telephone.  Patient does not have record (since 2016) of microalbuminuria or urine microalbumin/creatinine ratio.  - Consider ordering these tests on next appt.  Patient has a smoking history, hx of uncontrolled HTN, and high cholesterol. - F/u with patient on smoking history on next visit - No hx of statin use in records, consider placing patient on high-intensity statin given diabetes and CVD risk factors

## 2019-11-01 ENCOUNTER — Telehealth: Payer: Self-pay | Admitting: Orthopedic Surgery

## 2019-11-01 ENCOUNTER — Ambulatory Visit: Payer: Self-pay | Admitting: General Surgery

## 2019-11-01 NOTE — Telephone Encounter (Signed)
Hemoglobin A1c needs to be less than or equal to 8 and we are waiting on the CT scan.  That is where we are with the shoulder.  We cannot press on until those 2 things have been taken care of

## 2019-11-01 NOTE — Telephone Encounter (Signed)
Pt called in requesting a call back from someone with his CT scan results. Please give him a call [phone]

## 2019-11-01 NOTE — Telephone Encounter (Signed)
Patient called stating that his arm is still hurting and is unable to raise his arm, he stated it is like a toothache.  [phone].  Thank you.

## 2019-11-01 NOTE — Telephone Encounter (Signed)
Sent in error

## 2019-11-01 NOTE — Telephone Encounter (Signed)
Please advise. Thanks.  

## 2019-11-02 NOTE — Telephone Encounter (Signed)
CT scan has been done. IC s/w patient and advised. He said he is working on this. He goes back to clinic on Tuesday to have labs redrawn. He will have them fax [place] results.

## 2019-11-06 ENCOUNTER — Ambulatory Visit: Payer: Self-pay | Admitting: Gerontology

## 2019-11-13 ENCOUNTER — Ambulatory Visit (INDEPENDENT_AMBULATORY_CARE_PROVIDER_SITE_OTHER): Payer: Self-pay | Admitting: General Surgery

## 2019-11-13 ENCOUNTER — Other Ambulatory Visit: Payer: Self-pay

## 2019-11-13 ENCOUNTER — Encounter: Payer: Self-pay | Admitting: General Surgery

## 2019-11-13 VITALS — BP 175/97 | HR 89 | Temp 97.9°F | Ht 67.0 in | Wt 209.0 lb

## 2019-11-13 DIAGNOSIS — R1031 Right lower quadrant pain: Secondary | ICD-10-CM | POA: Insufficient documentation

## 2019-11-13 NOTE — Patient Instructions (Addendum)
You may continue to use ice to the area.   We will schedule you to see Dr Dahlia Byes for your nerve pain.

## 2019-11-13 NOTE — Progress Notes (Signed)
Patient ID: Justin Landry, male   DOB: 1961/04/13, 58 y.o.   MRN: [id number]  Chief Complaint  Patient presents with   Other    inguinal hernia    HPI Justin Landry is a 58 y.o. male.   In 2009, he had an open right inguinal hernia repair.  As of at least 2015, he has seen multiple surgical providers for complaints of pain in the area.  He describes the pain as burning or like being stung by a yellow jacket.  He says that when he applies ice and the area is numb, the pain resolves.  Once the numbness is gone, the pain returns.  He states that the pain is constantly at about a 7.  When he was seen at [facility] in 2015, he had had a CT scan of the pelvis that showed a small amount of fat in each inguinal canal, but no bowel-containing hernias.  Apparently, there was a plan for him to undergo laparoscopic bilateral inguinal hernia repair, but this did not occur.  He was also seen at [facility] in 2015 and again diagnosed with inguinodynia.  He was prescribed gabapentin, which he says sort of worked, but made him feel loopy.  He states that he has seen a pain specialist and had an injection of the area, but that it only lasted a very brief period of time.  He had another CT scan in 2017 that again, showed just a small amount of fat in each inguinal canal without any bowel involvement.  He denies any nausea or vomiting or other symptoms of obstruction.    Past Medical History:  Diagnosis Date   Diabetes mellitus    High cholesterol    Humerus fracture    right   Hypertension    Inguinal hernia     Past Surgical History:  Procedure Laterality Date   AMPUTATION TOE Left 06/23/2019   Procedure: AMPUTATION TOE LEFT AMPUTATION;  Surgeon: Sharlotte Alamo, DPM;  Location: [facility] ORS;  Service: Podiatry;  Laterality: Left;   AMPUTATION TOE Left 08/31/2019   Procedure: AMPUTATION TOE  IPJ [id number];  Surgeon: Sharlotte Alamo, DPM;  Location: [facility] ORS;   Service: Podiatry;  Laterality: Left;   CYSTOSCOPY  2015   Biopsy   HERNIA REPAIR Right 2008   [place]    Family History  Problem Relation Age of Onset   Diabetes Mother    Hypertension Mother    Diabetes Father    Hypertension Father    Cancer Brother        brain    Social History Social History   Tobacco Use   Smoking status: Current Every Day Smoker    Packs/day: 1.00    Types: Cigarettes   Smokeless tobacco: Never Used  Substance Use Topics   Alcohol use: Yes    Alcohol/week: 2.0 standard drinks    Types: 2 Cans of beer per week    Comment: occasional   Drug use: Yes    Types: Marijuana    Allergies  Allergen Reactions   Morphine And Related Nausea And Vomiting and Other (See Comments)    Extreme sweating    Oxycontin [Oxycodone Hcl] Other (See Comments)    Extreme NAusea and vomitting follows (Patient Tolerates Percocet short acting)   Penicillins Nausea And Vomiting    Has patient had a PCN reaction causing immediate rash, facial/tongue/throat swelling, SOB or lightheadedness with hypotension: No Has patient had a PCN reaction causing  severe rash involving mucus membranes or skin necrosis: No Has patient had a PCN reaction that required hospitalization No Has patient had a PCN reaction occurring within the last 10 years: No If all of the above answers are "NO", then may proceed with Cephalosporin use.    Tramadol Nausea Only    Current Outpatient Medications  Medication Sig Dispense Refill   amLODipine (NORVASC) 5 MG tablet TAKE ONE TABLET BY MOUTH EVERY DAY 30 tablet 0   Blood Pressure KIT 1 kit by Does not apply route daily. 1 kit 0   Cyanocobalamin (VITAMIN B 12 PO) Take 1 tablet by mouth daily.     Fish Oil-Cholecalciferol (FISH OIL + D3) 1000-1000 MG-UNIT CAPS Take 1 capsule by mouth daily with breakfast.      gabapentin (NEURONTIN) 400 MG capsule TAKE ONE CAPSULE BY MOUTH 3 TIMES A DAY 90 capsule 0   glipiZIDE (GLUCOTROL) 5  MG tablet TAKE ONE-HALF TABLET (2.5MG) BY MOUTH EVERY DAY BEFORE BREAKFAST 15 tablet 0   Glucosamine-Chondroit-Vit C-Mn (GLUCOSAMINE 1500 COMPLEX PO) Take 1 tablet by mouth daily.     lisinopril (ZESTRIL) 20 MG tablet Take 2 tablets (40 mg total) by mouth daily. 60 tablet 1   metFORMIN (GLUCOPHAGE) 1000 MG tablet Take 1 tablet (1,000 mg total) by mouth 2 (two) times daily with a meal. 60 tablet 1   No current facility-administered medications for this visit.     Review of Systems Review of Systems  Gastrointestinal: Positive for abdominal pain.       In the right groin  Musculoskeletal: Positive for arthralgias and gait problem.  All other systems reviewed and are negative.   Blood pressure (!) 175/97, pulse 89, temperature 97.9 F (36.6 C), height 5' 7"  (1.702 m), weight 209 lb (94.8 kg), SpO2 97 %.  Physical Exam Physical Exam Vitals signs reviewed. Exam conducted with a chaperone present.  Constitutional:      General: He is not in acute distress.    Appearance: Normal appearance. He is obese.  HENT:     Head: Normocephalic and atraumatic.     Nose:     Comments: Covered with a mask secondary to COVID-19 precautions    Mouth/Throat:     Comments: Covered with a mask secondary to COVID-19 precautions Eyes:     General:        Right eye: No discharge.        Left eye: No discharge.     Conjunctiva/sclera: Conjunctivae normal.  Neck:     Musculoskeletal: Normal range of motion.     Comments: No palpable thyromegaly or dominant thyroid masses. Cardiovascular:     Rate and Rhythm: Normal rate and regular rhythm.     Comments: Decreased pedal pulses bilaterally, worse in the left dorsalis pedis and posterior tibialis Pulmonary:     Effort: Pulmonary effort is normal. No respiratory distress.  Abdominal:     General: Bowel sounds are normal.     Palpations: Abdomen is soft.  Genitourinary:      Comments: No palpable hernia defect is identified in either inguinal  canal, even with Valsalva.  Patient is very sensitive and tender at the point marked with an X above.  He has extreme sensitivity to even having the hair in the area brushed with a hand.  He states that the pain radiates down into his scrotum. Musculoskeletal:     Right lower leg: No edema.     Left lower leg: No edema.  Lymphadenopathy:  Cervical: No cervical adenopathy.  Skin:    General: Skin is warm and dry.  Neurological:     General: No focal deficit present.     Mental Status: He is alert.  Psychiatric:        Mood and Affect: Mood normal.        Behavior: Behavior normal.     Data Reviewed I reviewed the surgical clinic note from Dr. Alphonsa Overall, dated April 29th 2015.  At that visit, Dr. Lucia Gaskins stated that he had bilateral inguinal hernias and had planned a bilateral laparoscopic repair.  He was cautioned at that time, that "pain prior to hernia surgery is a risk factor for continued pain postop, despite the hernias being repaired, and that surgery may not resolve all of his symptoms."  That note also confirms that Dr. Lucia Gaskins repaired a large direct right inguinal hernia on August 09, 2008 at [facility].   I then reviewed the clinic note from [facility] dated July 15, 2014.  At that visit, it was noted that the hernia repair planned by Dr. Lucia Gaskins was not performed due to loss of insurance.  He was seen in the chairman's surgery clinic (clinic for indigent patients) at [facility].  On exam there, they noted pain upon palpation of the right inguinal ring with a small palpable mass present with coughing and no hernia palpated in the left inguinal region.  He was instructed that he needed to stop smoking prior to his hernia repair.  He was also referred to urology for evaluation of a bladder mass as well as to colon and rectal surgery for colonoscopy.  He followed up in the chairman's clinic at [facility] on August 27, 2014.  He had undergone resection of  a bladder mass with urology.  At that visit, no hernia was palpated on exam on the right, but a small hernia was palpated on the left.  Again, smoking cessation was recommended.  Another visit occurred on October 16 of that year.  This discusses a CT scan done on June 25, 2014 that did not show any evidence of a right recurrent hernia, only a hernia on the left.  At that time, it was entertained that he "may have chronic groin pain on the right side from his prior repair."    A fourth, and what appears to be final, clinic visit at [facility] occurred on October 14, 2014.  At that visit, Justin Landry described lightening-type pain down the right scrotum like a "hot poker." It was felt to be less bothersome while at rest.  He had undergone a screening colonoscopy prior to that visit as well as a follow-up barium enema that demonstrated sigmoid diverticulosis without additional pathology.  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 This could potentially exacerbate his chronic pain symptoms. In addition, operative intervention on an asymptomatic left hernia could lead to the same chronic pain symptoms. The best intervention at this point is not surgical. Justin Landry needs optimal pain control and treatment of the neuralgia. I have referred him to a chronic pain center for more specialized management. He may try gabapentin in the interim to see if this provides any relief. Follow up with  PCP for additional concerns. Please contact our clinic if left inguinal hernia becomes symptomatic and will be happy to repair once he has stopped smoking (currently still smoking). I provided a scrotal support for him to wear during work to help secure  the scrotum and minimize jarring. He is clear to return back to work (note given)."  The most recent imaging is from 2017.  I reviewed these images and am in agreement with the radiologist interpretation, which is copied here: IMPRESSION: There is fat in each inguinal ring.  No bowel containing hernia.  Assessment This is a 58 year old man who had an open right inguinal hernia repaired in 2009.  For at least the past 5 years, he has had chronic pain in that area.  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  My evaluation today is also consistent with this diagnosis.  Plan Today, Justin Landry and I discussed the possible interventions that could be undertaken.  We talked about a ilioinguinal nerve block with a combination of local anesthetic and steroid.  Justin Landry states that he had this done in the past and that it was ineffective.  There is also the possibility of chemical or surgical neurolysis.  I do not perform any of these procedures, however one of my partners, Dr. Caroleen Hamman, does perform the block on a fairly regular basis.  At one time, there was a Psychiatric nurse at [facility] who performed surgical neurolysis, however she is no longer with the institution.  Based upon the available resources, Justin Landry would like to be seen by Dr. Dahlia Byes. for evaluation and determination of whether or not a block may be effective for him.  We will arrange an appointment.    Justin Landry 11/13/2019, 11:14 AM

## 2019-11-14 ENCOUNTER — Ambulatory Visit: Payer: Self-pay | Admitting: Gerontology

## 2019-11-14 ENCOUNTER — Encounter: Payer: Self-pay | Admitting: Gerontology

## 2019-11-14 ENCOUNTER — Other Ambulatory Visit: Payer: Self-pay

## 2019-11-14 VITALS — BP 187/104 | HR 85 | Ht 67.0 in | Wt 208.7 lb

## 2019-11-14 DIAGNOSIS — F419 Anxiety disorder, unspecified: Secondary | ICD-10-CM

## 2019-11-14 DIAGNOSIS — E785 Hyperlipidemia, unspecified: Secondary | ICD-10-CM

## 2019-11-14 DIAGNOSIS — I10 Essential (primary) hypertension: Secondary | ICD-10-CM

## 2019-11-14 DIAGNOSIS — E119 Type 2 diabetes mellitus without complications: Secondary | ICD-10-CM

## 2019-11-14 MED ORDER — GLIPIZIDE 5 MG PO TABS: ORAL_TABLET | ORAL | 0 refills | Status: DC

## 2019-11-14 MED ORDER — AMLODIPINE BESYLATE 10 MG PO TABS: 10.0000 mg | ORAL_TABLET | Freq: Every day | ORAL | 0 refills | Status: DC

## 2019-11-14 MED ORDER — ROSUVASTATIN CALCIUM 5 MG PO TABS: 5.0000 mg | ORAL_TABLET | Freq: Every day | ORAL | 0 refills | Status: DC

## 2019-11-14 MED ORDER — AMLODIPINE BESYLATE 10 MG PO TABS: 5.0000 mg | ORAL_TABLET | Freq: Every day | ORAL | 0 refills | Status: DC

## 2019-11-14 MED ORDER — HYDRALAZINE HCL 25 MG PO TABS: 12.5000 mg | ORAL_TABLET | Freq: Two times a day (BID) | ORAL | 0 refills | Status: DC

## 2019-11-14 NOTE — Patient Instructions (Signed)
Fat and Cholesterol Restricted Eating Plan Getting too much fat and cholesterol in your diet may cause health problems. Choosing the right foods helps keep your fat and cholesterol at normal levels. This can keep you from getting certain diseases. Your doctor may recommend an eating plan that includes:  Total fat: ______% or less of total calories a day.  Saturated fat: ______% or less of total calories a day.  Cholesterol: less than _________mg a day.  Fiber: ______g a day. What are tips for following this plan? Meal planning  At meals, divide your plate into four equal parts: ? Fill one-half of your plate with vegetables and green salads. ? Fill one-fourth of your plate with whole grains. ? Fill one-fourth of your plate with low-fat (lean) protein foods.  Eat fish that is high in omega-3 fats at least two times a week. This includes mackerel, tuna, sardines, and salmon.  Eat foods that are high in fiber, such as whole grains, beans, apples, broccoli, carrots, peas, and barley. General tips   Work with your doctor to lose weight if you need to.  Avoid: ? Foods with added sugar. ? Fried foods. ? Foods with partially hydrogenated oils.  Limit alcohol intake to no more than 1 drink a day for nonpregnant women and 2 drinks a day for men. One drink equals 12 oz of beer, 5 oz of wine, or 1 oz of hard liquor. Reading food labels  Check food labels for: ? Trans fats. ? Partially hydrogenated oils. ? Saturated fat (g) in each serving. ? Cholesterol (mg) in each serving. ? Fiber (g) in each serving.  Choose foods with healthy fats, such as: ? Monounsaturated fats. ? Polyunsaturated fats. ? Omega-3 fats.  Choose grain products that have whole grains. Look for the word "whole" as the first word in the ingredient list. Cooking  Cook foods using low-fat methods. These include baking, boiling, grilling, and broiling.  Eat more home-cooked foods. Eat at restaurants and buffets  less often.  Avoid cooking using saturated fats, such as butter, cream, palm oil, palm kernel oil, and coconut oil. Recommended foods  Fruits  All fresh, canned (in natural juice), or frozen fruits. Vegetables  Fresh or frozen vegetables (raw, steamed, roasted, or grilled). Green salads. Grains  Whole grains, such as whole wheat or whole grain breads, crackers, cereals, and pasta. Unsweetened oatmeal, bulgur, barley, quinoa, or brown rice. Corn or whole wheat flour tortillas. Meats and other protein foods  Ground beef (85% or leaner), grass-fed beef, or beef trimmed of fat. Skinless chicken or turkey. Ground chicken or turkey. Pork trimmed of fat. All fish and seafood. Egg whites. Dried beans, peas, or lentils. Unsalted nuts or seeds. Unsalted canned beans. Nut butters without added sugar or oil. Dairy  Low-fat or nonfat dairy products, such as skim or 1% milk, 2% or reduced-fat cheeses, low-fat and fat-free ricotta or cottage cheese, or plain low-fat and nonfat yogurt. Fats and oils  Tub margarine without trans fats. Light or reduced-fat mayonnaise and salad dressings. Avocado. Olive, canola, sesame, or safflower oils. The items listed above may not be a complete list of foods and beverages you can eat. Contact a dietitian for more information. Foods to avoid Fruits  Canned fruit in heavy syrup. Fruit in cream or butter sauce. Fried fruit. Vegetables  Vegetables cooked in cheese, cream, or butter sauce. Fried vegetables. Grains  White bread. White pasta. White rice. Cornbread. Bagels, pastries, and croissants. Crackers and snack foods that contain trans fat   and hydrogenated oils. Meats and other protein foods  Fatty cuts of meat. Ribs, chicken wings, bacon, sausage, bologna, salami, chitterlings, fatback, hot dogs, bratwurst, and packaged lunch meats. Liver and organ meats. Whole eggs and egg yolks. Chicken and turkey with skin. Fried meat. Dairy  Whole or 2% milk, cream,  half-and-half, and cream cheese. Whole milk cheeses. Whole-fat or sweetened yogurt. Full-fat cheeses. Nondairy creamers and whipped toppings. Processed cheese, cheese spreads, and cheese curds. Beverages  Alcohol. Sugar-sweetened drinks such as sodas, lemonade, and fruit drinks. Fats and oils  Butter, stick margarine, lard, shortening, ghee, or bacon fat. Coconut, palm kernel, and palm oils. Sweets and desserts  Corn syrup, sugars, honey, and molasses. Candy. Jam and jelly. Syrup. Sweetened cereals. Cookies, pies, cakes, donuts, muffins, and ice cream. The items listed above may not be a complete list of foods and beverages you should avoid. Contact a dietitian for more information. Summary  Choosing the right foods helps keep your fat and cholesterol at normal levels. This can keep you from getting certain diseases.  At meals, fill one-half of your plate with vegetables and green salads.  Eat high-fiber foods, like whole grains, beans, apples, carrots, peas, and barley.  Limit added sugar, saturated fats, alcohol, and fried foods. This information is not intended to replace advice given to you by your health care provider. Make sure you discuss any questions you have with your health care provider. Document Released: 05/30/2012 Document Revised: 08/02/2018 Document Reviewed: 08/16/2017 Elsevier Patient Education  2020 [employer] Carbohydrate Counting for Diabetes Mellitus, Adult  Carbohydrate counting is a method of keeping track of how many carbohydrates you eat. Eating carbohydrates naturally increases the amount of sugar (glucose) in the blood. Counting how many carbohydrates you eat helps keep your blood glucose within normal limits, which helps you manage your diabetes (diabetes mellitus). It is important to know how many carbohydrates you can safely have in each meal. This is different for every person. A diet and nutrition specialist (registered dietitian) can help you make a  meal plan and calculate how many carbohydrates you should have at each meal and snack. Carbohydrates are found in the following foods:  Grains, such as breads and cereals.  Dried beans and soy products.  Starchy vegetables, such as potatoes, peas, and corn.  Fruit and fruit juices.  Milk and yogurt.  Sweets and snack foods, such as cake, cookies, candy, chips, and soft drinks. How do I count carbohydrates? There are two ways to count carbohydrates in food. You can use either of the methods or a combination of both. Reading "Nutrition Facts" on packaged food The "Nutrition Facts" list is included on the labels of almost all packaged foods and beverages in the [place] It includes:  The serving size.  Information about nutrients in each serving, including the grams (g) of carbohydrate per serving. To use the "Nutrition Facts":  Decide how many servings you will have.  Multiply the number of servings by the number of carbohydrates per serving.  The resulting number is the total amount of carbohydrates that you will be having. Learning standard serving sizes of other foods When you eat carbohydrate foods that are not packaged or do not include "Nutrition Facts" on the label, you need to measure the servings in order to count the amount of carbohydrates:  Measure the foods that you will eat with a food scale or measuring cup, if needed.  Decide how many standard-size servings you will eat.  Multiply the number of   servings by 15. Most carbohydrate-rich foods have about 15 g of carbohydrates per serving. ? For example, if you eat 8 oz (170 g) of strawberries, you will have eaten 2 servings and 30 g of carbohydrates (2 servings x 15 g = 30 g).  For foods that have more than one food mixed, such as soups and casseroles, you must count the carbohydrates in each food that is included. The following list contains standard serving sizes of common carbohydrate-rich foods. Each of these servings  has about 15 g of carbohydrates:   hamburger bun or  English muffin.   oz (15 mL) syrup.   oz (14 g) jelly.  1 slice of bread.  1 six-inch tortilla.  3 oz (85 g) cooked rice or pasta.  4 oz (113 g) cooked dried beans.  4 oz (113 g) starchy vegetable, such as peas, corn, or potatoes.  4 oz (113 g) hot cereal.  4 oz (113 g) mashed potatoes or  of a large baked potato.  4 oz (113 g) canned or frozen fruit.  4 oz (120 mL) fruit juice.  4-6 crackers.  6 chicken nuggets.  6 oz (170 g) unsweetened dry cereal.  6 oz (170 g) plain fat-free yogurt or yogurt sweetened with artificial sweeteners.  8 oz (240 mL) milk.  8 oz (170 g) fresh fruit or one small piece of fruit.  24 oz (680 g) popped popcorn. Example of carbohydrate counting Sample meal  3 oz (85 g) chicken breast.  6 oz (170 g) brown rice.  4 oz (113 g) corn.  8 oz (240 mL) milk.  8 oz (170 g) strawberries with sugar-free whipped topping. Carbohydrate calculation 1. Identify the foods that contain carbohydrates: ? Rice. ? Corn. ? Milk. ? Strawberries. 2. Calculate how many servings you have of each food: ? 2 servings rice. ? 1 serving corn. ? 1 serving milk. ? 1 serving strawberries. 3. Multiply each number of servings by 15 g: ? 2 servings rice x 15 g = 30 g. ? 1 serving corn x 15 g = 15 g. ? 1 serving milk x 15 g = 15 g. ? 1 serving strawberries x 15 g = 15 g. 4. Add together all of the amounts to find the total grams of carbohydrates eaten: ? 30 g + 15 g + 15 g + 15 g = 75 g of carbohydrates total. Summary  Carbohydrate counting is a method of keeping track of how many carbohydrates you eat.  Eating carbohydrates naturally increases the amount of sugar (glucose) in the blood.  Counting how many carbohydrates you eat helps keep your blood glucose within normal limits, which helps you manage your diabetes.  A diet and nutrition specialist (registered dietitian) can help you make a meal  plan and calculate how many carbohydrates you should have at each meal and snack. This information is not intended to replace advice given to you by your health care provider. Make sure you discuss any questions you have with your health care provider. Document Released: 11/29/2005 Document Revised: 06/23/2017 Document Reviewed: 05/12/2016 Elsevier Patient Education  2020 [employer] DASH Eating Plan DASH stands for "Dietary Approaches to Stop Hypertension." The DASH eating plan is a healthy eating plan that has been shown to reduce high blood pressure (hypertension). It may also reduce your risk for type 2 diabetes, heart disease, and stroke. The DASH eating plan may also help with weight loss. What are tips for following this plan?  General guidelines    Avoid eating more than 2,300 mg (milligrams) of salt (sodium) a day. If you have hypertension, you may need to reduce your sodium intake to 1,500 mg a day.  Limit alcohol intake to no more than 1 drink a day for nonpregnant women and 2 drinks a day for men. One drink equals 12 oz of beer, 5 oz of wine, or 1 oz of hard liquor.  Work with your health care provider to maintain a healthy body weight or to lose weight. Ask what an ideal weight is for you.  Get at least 30 minutes of exercise that causes your heart to beat faster (aerobic exercise) most days of the week. Activities may include walking, swimming, or biking.  Work with your health care provider or diet and nutrition specialist (dietitian) to adjust your eating plan to your individual calorie needs. Reading food labels   Check food labels for the amount of sodium per serving. Choose foods with less than 5 percent of the Daily Value of sodium. Generally, foods with less than 300 mg of sodium per serving fit into this eating plan.  To find whole grains, look for the word "whole" as the first word in the ingredient list. Shopping  Buy products labeled as "low-sodium" or "no salt  added."  Buy fresh foods. Avoid canned foods and premade or frozen meals. Cooking  Avoid adding salt when cooking. Use salt-free seasonings or herbs instead of table salt or sea salt. Check with your health care provider or pharmacist before using salt substitutes.  Do not fry foods. Cook foods using healthy methods such as baking, boiling, grilling, and broiling instead.  Cook with heart-healthy oils, such as olive, canola, soybean, or sunflower oil. Meal planning  Eat a balanced diet that includes: ? 5 or more servings of fruits and vegetables each day. At each meal, try to fill half of your plate with fruits and vegetables. ? Up to 6-8 servings of whole grains each day. ? Less than 6 oz of lean meat, poultry, or fish each day. A 3-oz serving of meat is about the same size as a deck of cards. One egg equals 1 oz. ? 2 servings of low-fat dairy each day. ? A serving of nuts, seeds, or beans 5 times each week. ? Heart-healthy fats. Healthy fats called Omega-3 fatty acids are found in foods such as flaxseeds and coldwater fish, like sardines, salmon, and mackerel.  Limit how much you eat of the following: ? Canned or prepackaged foods. ? Food that is high in trans fat, such as fried foods. ? Food that is high in saturated fat, such as fatty meat. ? Sweets, desserts, sugary drinks, and other foods with added sugar. ? Full-fat dairy products.  Do not salt foods before eating.  Try to eat at least 2 vegetarian meals each week.  Eat more home-cooked food and less restaurant, buffet, and fast food.  When eating at a restaurant, ask that your food be prepared with less salt or no salt, if possible. What foods are recommended? The items listed may not be a complete list. Talk with your dietitian about what dietary choices are best for you. Grains Whole-grain or whole-wheat bread. Whole-grain or whole-wheat pasta. Brown rice. Oatmeal. Quinoa. Bulgur. Whole-grain and low-sodium cereals.  Pita bread. Low-fat, low-sodium crackers. Whole-wheat flour tortillas. Vegetables Fresh or frozen vegetables (raw, steamed, roasted, or grilled). Low-sodium or reduced-sodium tomato and vegetable juice. Low-sodium or reduced-sodium tomato sauce and tomato paste. Low-sodium or reduced-sodium canned vegetables. Fruits   All fresh, dried, or frozen fruit. Canned fruit in natural juice (without added sugar). Meat and other protein foods Skinless chicken or turkey. Ground chicken or turkey. Pork with fat trimmed off. Fish and seafood. Egg whites. Dried beans, peas, or lentils. Unsalted nuts, nut butters, and seeds. Unsalted canned beans. Lean cuts of beef with fat trimmed off. Low-sodium, lean deli meat. Dairy Low-fat (1%) or fat-free (skim) milk. Fat-free, low-fat, or reduced-fat cheeses. Nonfat, low-sodium ricotta or cottage cheese. Low-fat or nonfat yogurt. Low-fat, low-sodium cheese. Fats and oils Soft margarine without trans fats. Vegetable oil. Low-fat, reduced-fat, or light mayonnaise and salad dressings (reduced-sodium). Canola, safflower, olive, soybean, and sunflower oils. Avocado. Seasoning and other foods Herbs. Spices. Seasoning mixes without salt. Unsalted popcorn and pretzels. Fat-free sweets. What foods are not recommended? The items listed may not be a complete list. Talk with your dietitian about what dietary choices are best for you. Grains Baked goods made with fat, such as croissants, muffins, or some breads. Dry pasta or rice meal packs. Vegetables Creamed or fried vegetables. Vegetables in a cheese sauce. Regular canned vegetables (not low-sodium or reduced-sodium). Regular canned tomato sauce and paste (not low-sodium or reduced-sodium). Regular tomato and vegetable juice (not low-sodium or reduced-sodium). Pickles. Olives. Fruits Canned fruit in a light or heavy syrup. Fried fruit. Fruit in cream or butter sauce. Meat and other protein foods Fatty cuts of meat. Ribs. Fried  meat. Bacon. Sausage. Bologna and other processed lunch meats. Salami. Fatback. Hotdogs. Bratwurst. Salted nuts and seeds. Canned beans with added salt. Canned or smoked fish. Whole eggs or egg yolks. Chicken or turkey with skin. Dairy Whole or 2% milk, cream, and half-and-half. Whole or full-fat cream cheese. Whole-fat or sweetened yogurt. Full-fat cheese. Nondairy creamers. Whipped toppings. Processed cheese and cheese spreads. Fats and oils Butter. Stick margarine. Lard. Shortening. Ghee. Bacon fat. Tropical oils, such as coconut, palm kernel, or palm oil. Seasoning and other foods Salted popcorn and pretzels. Onion salt, garlic salt, seasoned salt, table salt, and sea salt. Worcestershire sauce. Tartar sauce. Barbecue sauce. Teriyaki sauce. Soy sauce, including reduced-sodium. Steak sauce. Canned and packaged gravies. Fish sauce. Oyster sauce. Cocktail sauce. Horseradish that you find on the shelf. Ketchup. Mustard. Meat flavorings and tenderizers. Bouillon cubes. Hot sauce and Tabasco sauce. Premade or packaged marinades. Premade or packaged taco seasonings. Relishes. Regular salad dressings. Where to find more information:  National Heart, Lung, and Blood Institute: www.nhlbi.nih.gov  American Heart Association: www.heart.org Summary  The DASH eating plan is a healthy eating plan that has been shown to reduce high blood pressure (hypertension). It may also reduce your risk for type 2 diabetes, heart disease, and stroke.  With the DASH eating plan, you should limit salt (sodium) intake to 2,300 mg a day. If you have hypertension, you may need to reduce your sodium intake to 1,500 mg a day.  When on the DASH eating plan, aim to eat more fresh fruits and vegetables, whole grains, lean proteins, low-fat dairy, and heart-healthy fats.  Work with your health care provider or diet and nutrition specialist (dietitian) to adjust your eating plan to your individual calorie needs. This information is  not intended to replace advice given to you by your health care provider. Make sure you discuss any questions you have with your health care provider. Document Released: 11/18/2011 Document Revised: 11/11/2017 Document Reviewed: 11/22/2016 Elsevier Patient Education  2020 [employer]  
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
## 2019-11-20 ENCOUNTER — Institutional Professional Consult (permissible substitution): Payer: Self-pay | Admitting: Licensed Clinical Social Worker

## 2019-11-21 ENCOUNTER — Ambulatory Visit: Payer: Self-pay | Admitting: Gerontology

## 2019-11-22 ENCOUNTER — Ambulatory Visit: Payer: Self-pay | Admitting: Licensed Clinical Social Worker

## 2019-11-22 ENCOUNTER — Other Ambulatory Visit: Payer: Self-pay

## 2019-11-22 DIAGNOSIS — F1220 Cannabis dependence, uncomplicated: Secondary | ICD-10-CM

## 2019-11-22 DIAGNOSIS — F6381 Intermittent explosive disorder: Secondary | ICD-10-CM

## 2019-11-22 DIAGNOSIS — F411 Generalized anxiety disorder: Secondary | ICD-10-CM
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
## 2019-11-26 ENCOUNTER — Ambulatory Visit: Payer: Self-pay | Admitting: Surgery

## 2019-11-26 ENCOUNTER — Other Ambulatory Visit: Payer: Self-pay | Admitting: Gerontology

## 2019-11-26 DIAGNOSIS — I10 Essential (primary) hypertension: Secondary | ICD-10-CM

## 2019-11-27 ENCOUNTER — Encounter: Payer: Self-pay | Admitting: Gerontology

## 2019-11-27 ENCOUNTER — Other Ambulatory Visit: Payer: Self-pay

## 2019-11-27 ENCOUNTER — Ambulatory Visit: Payer: Self-pay | Admitting: Gerontology

## 2019-11-27 VITALS — BP 159/92 | HR 84 | Ht 67.0 in | Wt 209.3 lb

## 2019-11-27 DIAGNOSIS — I10 Essential (primary) hypertension: Secondary | ICD-10-CM

## 2019-11-27 DIAGNOSIS — M25511 Pain in right shoulder: Secondary | ICD-10-CM

## 2019-11-27 DIAGNOSIS — E785 Hyperlipidemia, unspecified: Secondary | ICD-10-CM

## 2019-11-27 DIAGNOSIS — E119 Type 2 diabetes mellitus without complications: Secondary | ICD-10-CM

## 2019-11-27 MED ORDER — METFORMIN HCL 1000 MG PO TABS: 1000.0000 mg | ORAL_TABLET | Freq: Two times a day (BID) | ORAL | 3 refills | Status: DC

## 2019-11-27 MED ORDER — HYDRALAZINE HCL 25 MG PO TABS: 25.0000 mg | ORAL_TABLET | Freq: Two times a day (BID) | ORAL | 1 refills | Status: DC

## 2019-11-27 MED ORDER — GABAPENTIN 400 MG PO CAPS: 400.0000 mg | ORAL_CAPSULE | Freq: Three times a day (TID) | ORAL | 1 refills | Status: DC

## 2019-11-27 MED ORDER — LISINOPRIL 40 MG PO TABS: ORAL_TABLET | ORAL | 3 refills | Status: DC

## 2019-11-27 MED ORDER — ROSUVASTATIN CALCIUM 5 MG PO TABS: 5.0000 mg | ORAL_TABLET | Freq: Every day | ORAL | 3 refills | Status: DC

## 2019-11-27 MED ORDER — AMLODIPINE BESYLATE 10 MG PO TABS: 10.0000 mg | ORAL_TABLET | Freq: Every day | ORAL | 3 refills | Status: DC

## 2019-11-27 NOTE — Patient Instructions (Signed)
Fat and Cholesterol Restricted Eating Plan Getting too much fat and cholesterol in your diet may cause health problems. Choosing the right foods helps keep your fat and cholesterol at normal levels. This can keep you from getting certain diseases. Your doctor may recommend an eating plan that includes:  Total fat: ______% or less of total calories a day.  Saturated fat: ______% or less of total calories a day.  Cholesterol: less than _________mg a day.  Fiber: ______g a day. What are tips for following this plan? Meal planning  At meals, divide your plate into four equal parts: ? Fill one-half of your plate with vegetables and green salads. ? Fill one-fourth of your plate with whole grains. ? Fill one-fourth of your plate with low-fat (lean) protein foods.  Eat fish that is high in omega-3 fats at least two times a week. This includes mackerel, tuna, sardines, and salmon.  Eat foods that are high in fiber, such as whole grains, beans, apples, broccoli, carrots, peas, and barley. General tips   Work with your doctor to lose weight if you need to.  Avoid: ? Foods with added sugar. ? Fried foods. ? Foods with partially hydrogenated oils.  Limit alcohol intake to no more than 1 drink a day for nonpregnant women and 2 drinks a day for men. One drink equals 12 oz of beer, 5 oz of wine, or 1 oz of hard liquor. Reading food labels  Check food labels for: ? Trans fats. ? Partially hydrogenated oils. ? Saturated fat (g) in each serving. ? Cholesterol (mg) in each serving. ? Fiber (g) in each serving.  Choose foods with healthy fats, such as: ? Monounsaturated fats. ? Polyunsaturated fats. ? Omega-3 fats.  Choose grain products that have whole grains. Look for the word "whole" as the first word in the ingredient list. Cooking  Cook foods using low-fat methods. These include baking, boiling, grilling, and broiling.  Eat more home-cooked foods. Eat at restaurants and buffets  less often.  Avoid cooking using saturated fats, such as butter, cream, palm oil, palm kernel oil, and coconut oil. Recommended foods  Fruits  All fresh, canned (in natural juice), or frozen fruits. Vegetables  Fresh or frozen vegetables (raw, steamed, roasted, or grilled). Green salads. Grains  Whole grains, such as whole wheat or whole grain breads, crackers, cereals, and pasta. Unsweetened oatmeal, bulgur, barley, quinoa, or brown rice. Corn or whole wheat flour tortillas. Meats and other protein foods  Ground beef (85% or leaner), grass-fed beef, or beef trimmed of fat. Skinless chicken or turkey. Ground chicken or turkey. Pork trimmed of fat. All fish and seafood. Egg whites. Dried beans, peas, or lentils. Unsalted nuts or seeds. Unsalted canned beans. Nut butters without added sugar or oil. Dairy  Low-fat or nonfat dairy products, such as skim or 1% milk, 2% or reduced-fat cheeses, low-fat and fat-free ricotta or cottage cheese, or plain low-fat and nonfat yogurt. Fats and oils  Tub margarine without trans fats. Light or reduced-fat mayonnaise and salad dressings. Avocado. Olive, canola, sesame, or safflower oils. The items listed above may not be a complete list of foods and beverages you can eat. Contact a dietitian for more information. Foods to avoid Fruits  Canned fruit in heavy syrup. Fruit in cream or butter sauce. Fried fruit. Vegetables  Vegetables cooked in cheese, cream, or butter sauce. Fried vegetables. Grains  White bread. White pasta. White rice. Cornbread. Bagels, pastries, and croissants. Crackers and snack foods that contain trans fat   and hydrogenated oils. Meats and other protein foods  Fatty cuts of meat. Ribs, chicken wings, bacon, sausage, bologna, salami, chitterlings, fatback, hot dogs, bratwurst, and packaged lunch meats. Liver and organ meats. Whole eggs and egg yolks. Chicken and turkey with skin. Fried meat. Dairy  Whole or 2% milk, cream,  half-and-half, and cream cheese. Whole milk cheeses. Whole-fat or sweetened yogurt. Full-fat cheeses. Nondairy creamers and whipped toppings. Processed cheese, cheese spreads, and cheese curds. Beverages  Alcohol. Sugar-sweetened drinks such as sodas, lemonade, and fruit drinks. Fats and oils  Butter, stick margarine, lard, shortening, ghee, or bacon fat. Coconut, palm kernel, and palm oils. Sweets and desserts  Corn syrup, sugars, honey, and molasses. Candy. Jam and jelly. Syrup. Sweetened cereals. Cookies, pies, cakes, donuts, muffins, and ice cream. The items listed above may not be a complete list of foods and beverages you should avoid. Contact a dietitian for more information. Summary  Choosing the right foods helps keep your fat and cholesterol at normal levels. This can keep you from getting certain diseases.  At meals, fill one-half of your plate with vegetables and green salads.  Eat high-fiber foods, like whole grains, beans, apples, carrots, peas, and barley.  Limit added sugar, saturated fats, alcohol, and fried foods. This information is not intended to replace advice given to you by your health care provider. Make sure you discuss any questions you have with your health care provider. Document Released: 05/30/2012 Document Revised: 08/02/2018 Document Reviewed: 08/16/2017 Elsevier Patient Education  2020 [employer] Carbohydrate Counting for Diabetes Mellitus, Adult  Carbohydrate counting is a method of keeping track of how many carbohydrates you eat. Eating carbohydrates naturally increases the amount of sugar (glucose) in the blood. Counting how many carbohydrates you eat helps keep your blood glucose within normal limits, which helps you manage your diabetes (diabetes mellitus). It is important to know how many carbohydrates you can safely have in each meal. This is different for every person. A diet and nutrition specialist (registered dietitian) can help you make a  meal plan and calculate how many carbohydrates you should have at each meal and snack. Carbohydrates are found in the following foods:  Grains, such as breads and cereals.  Dried beans and soy products.  Starchy vegetables, such as potatoes, peas, and corn.  Fruit and fruit juices.  Milk and yogurt.  Sweets and snack foods, such as cake, cookies, candy, chips, and soft drinks. How do I count carbohydrates? There are two ways to count carbohydrates in food. You can use either of the methods or a combination of both. Reading "Nutrition Facts" on packaged food The "Nutrition Facts" list is included on the labels of almost all packaged foods and beverages in the [place] It includes:  The serving size.  Information about nutrients in each serving, including the grams (g) of carbohydrate per serving. To use the "Nutrition Facts":  Decide how many servings you will have.  Multiply the number of servings by the number of carbohydrates per serving.  The resulting number is the total amount of carbohydrates that you will be having. Learning standard serving sizes of other foods When you eat carbohydrate foods that are not packaged or do not include "Nutrition Facts" on the label, you need to measure the servings in order to count the amount of carbohydrates:  Measure the foods that you will eat with a food scale or measuring cup, if needed.  Decide how many standard-size servings you will eat.  Multiply the number of   servings by 15. Most carbohydrate-rich foods have about 15 g of carbohydrates per serving. ? For example, if you eat 8 oz (170 g) of strawberries, you will have eaten 2 servings and 30 g of carbohydrates (2 servings x 15 g = 30 g).  For foods that have more than one food mixed, such as soups and casseroles, you must count the carbohydrates in each food that is included. The following list contains standard serving sizes of common carbohydrate-rich foods. Each of these servings  has about 15 g of carbohydrates:   hamburger bun or  English muffin.   oz (15 mL) syrup.   oz (14 g) jelly.  1 slice of bread.  1 six-inch tortilla.  3 oz (85 g) cooked rice or pasta.  4 oz (113 g) cooked dried beans.  4 oz (113 g) starchy vegetable, such as peas, corn, or potatoes.  4 oz (113 g) hot cereal.  4 oz (113 g) mashed potatoes or  of a large baked potato.  4 oz (113 g) canned or frozen fruit.  4 oz (120 mL) fruit juice.  4-6 crackers.  6 chicken nuggets.  6 oz (170 g) unsweetened dry cereal.  6 oz (170 g) plain fat-free yogurt or yogurt sweetened with artificial sweeteners.  8 oz (240 mL) milk.  8 oz (170 g) fresh fruit or one small piece of fruit.  24 oz (680 g) popped popcorn. Example of carbohydrate counting Sample meal  3 oz (85 g) chicken breast.  6 oz (170 g) brown rice.  4 oz (113 g) corn.  8 oz (240 mL) milk.  8 oz (170 g) strawberries with sugar-free whipped topping. Carbohydrate calculation 1. Identify the foods that contain carbohydrates: ? Rice. ? Corn. ? Milk. ? Strawberries. 2. Calculate how many servings you have of each food: ? 2 servings rice. ? 1 serving corn. ? 1 serving milk. ? 1 serving strawberries. 3. Multiply each number of servings by 15 g: ? 2 servings rice x 15 g = 30 g. ? 1 serving corn x 15 g = 15 g. ? 1 serving milk x 15 g = 15 g. ? 1 serving strawberries x 15 g = 15 g. 4. Add together all of the amounts to find the total grams of carbohydrates eaten: ? 30 g + 15 g + 15 g + 15 g = 75 g of carbohydrates total. Summary  Carbohydrate counting is a method of keeping track of how many carbohydrates you eat.  Eating carbohydrates naturally increases the amount of sugar (glucose) in the blood.  Counting how many carbohydrates you eat helps keep your blood glucose within normal limits, which helps you manage your diabetes.  A diet and nutrition specialist (registered dietitian) can help you make a meal  plan and calculate how many carbohydrates you should have at each meal and snack. This information is not intended to replace advice given to you by your health care provider. Make sure you discuss any questions you have with your health care provider. Document Released: 11/29/2005 Document Revised: 06/23/2017 Document Reviewed: 05/12/2016 Elsevier Patient Education  2020 [employer] DASH Eating Plan DASH stands for "Dietary Approaches to Stop Hypertension." The DASH eating plan is a healthy eating plan that has been shown to reduce high blood pressure (hypertension). It may also reduce your risk for type 2 diabetes, heart disease, and stroke. The DASH eating plan may also help with weight loss. What are tips for following this plan?  General guidelines    Avoid eating more than 2,300 mg (milligrams) of salt (sodium) a day. If you have hypertension, you may need to reduce your sodium intake to 1,500 mg a day.  Limit alcohol intake to no more than 1 drink a day for nonpregnant women and 2 drinks a day for men. One drink equals 12 oz of beer, 5 oz of wine, or 1 oz of hard liquor.  Work with your health care provider to maintain a healthy body weight or to lose weight. Ask what an ideal weight is for you.  Get at least 30 minutes of exercise that causes your heart to beat faster (aerobic exercise) most days of the week. Activities may include walking, swimming, or biking.  Work with your health care provider or diet and nutrition specialist (dietitian) to adjust your eating plan to your individual calorie needs. Reading food labels   Check food labels for the amount of sodium per serving. Choose foods with less than 5 percent of the Daily Value of sodium. Generally, foods with less than 300 mg of sodium per serving fit into this eating plan.  To find whole grains, look for the word "whole" as the first word in the ingredient list. Shopping  Buy products labeled as "low-sodium" or "no salt  added."  Buy fresh foods. Avoid canned foods and premade or frozen meals. Cooking  Avoid adding salt when cooking. Use salt-free seasonings or herbs instead of table salt or sea salt. Check with your health care provider or pharmacist before using salt substitutes.  Do not fry foods. Cook foods using healthy methods such as baking, boiling, grilling, and broiling instead.  Cook with heart-healthy oils, such as olive, canola, soybean, or sunflower oil. Meal planning  Eat a balanced diet that includes: ? 5 or more servings of fruits and vegetables each day. At each meal, try to fill half of your plate with fruits and vegetables. ? Up to 6-8 servings of whole grains each day. ? Less than 6 oz of lean meat, poultry, or fish each day. A 3-oz serving of meat is about the same size as a deck of cards. One egg equals 1 oz. ? 2 servings of low-fat dairy each day. ? A serving of nuts, seeds, or beans 5 times each week. ? Heart-healthy fats. Healthy fats called Omega-3 fatty acids are found in foods such as flaxseeds and coldwater fish, like sardines, salmon, and mackerel.  Limit how much you eat of the following: ? Canned or prepackaged foods. ? Food that is high in trans fat, such as fried foods. ? Food that is high in saturated fat, such as fatty meat. ? Sweets, desserts, sugary drinks, and other foods with added sugar. ? Full-fat dairy products.  Do not salt foods before eating.  Try to eat at least 2 vegetarian meals each week.  Eat more home-cooked food and less restaurant, buffet, and fast food.  When eating at a restaurant, ask that your food be prepared with less salt or no salt, if possible. What foods are recommended? The items listed may not be a complete list. Talk with your dietitian about what dietary choices are best for you. Grains Whole-grain or whole-wheat bread. Whole-grain or whole-wheat pasta. Brown rice. Oatmeal. Quinoa. Bulgur. Whole-grain and low-sodium cereals.  Pita bread. Low-fat, low-sodium crackers. Whole-wheat flour tortillas. Vegetables Fresh or frozen vegetables (raw, steamed, roasted, or grilled). Low-sodium or reduced-sodium tomato and vegetable juice. Low-sodium or reduced-sodium tomato sauce and tomato paste. Low-sodium or reduced-sodium canned vegetables. Fruits   All fresh, dried, or frozen fruit. Canned fruit in natural juice (without added sugar). Meat and other protein foods Skinless chicken or turkey. Ground chicken or turkey. Pork with fat trimmed off. Fish and seafood. Egg whites. Dried beans, peas, or lentils. Unsalted nuts, nut butters, and seeds. Unsalted canned beans. Lean cuts of beef with fat trimmed off. Low-sodium, lean deli meat. Dairy Low-fat (1%) or fat-free (skim) milk. Fat-free, low-fat, or reduced-fat cheeses. Nonfat, low-sodium ricotta or cottage cheese. Low-fat or nonfat yogurt. Low-fat, low-sodium cheese. Fats and oils Soft margarine without trans fats. Vegetable oil. Low-fat, reduced-fat, or light mayonnaise and salad dressings (reduced-sodium). Canola, safflower, olive, soybean, and sunflower oils. Avocado. Seasoning and other foods Herbs. Spices. Seasoning mixes without salt. Unsalted popcorn and pretzels. Fat-free sweets. What foods are not recommended? The items listed may not be a complete list. Talk with your dietitian about what dietary choices are best for you. Grains Baked goods made with fat, such as croissants, muffins, or some breads. Dry pasta or rice meal packs. Vegetables Creamed or fried vegetables. Vegetables in a cheese sauce. Regular canned vegetables (not low-sodium or reduced-sodium). Regular canned tomato sauce and paste (not low-sodium or reduced-sodium). Regular tomato and vegetable juice (not low-sodium or reduced-sodium). Pickles. Olives. Fruits Canned fruit in a light or heavy syrup. Fried fruit. Fruit in cream or butter sauce. Meat and other protein foods Fatty cuts of meat. Ribs. Fried  meat. Bacon. Sausage. Bologna and other processed lunch meats. Salami. Fatback. Hotdogs. Bratwurst. Salted nuts and seeds. Canned beans with added salt. Canned or smoked fish. Whole eggs or egg yolks. Chicken or turkey with skin. Dairy Whole or 2% milk, cream, and half-and-half. Whole or full-fat cream cheese. Whole-fat or sweetened yogurt. Full-fat cheese. Nondairy creamers. Whipped toppings. Processed cheese and cheese spreads. Fats and oils Butter. Stick margarine. Lard. Shortening. Ghee. Bacon fat. Tropical oils, such as coconut, palm kernel, or palm oil. Seasoning and other foods Salted popcorn and pretzels. Onion salt, garlic salt, seasoned salt, table salt, and sea salt. Worcestershire sauce. Tartar sauce. Barbecue sauce. Teriyaki sauce. Soy sauce, including reduced-sodium. Steak sauce. Canned and packaged gravies. Fish sauce. Oyster sauce. Cocktail sauce. Horseradish that you find on the shelf. Ketchup. Mustard. Meat flavorings and tenderizers. Bouillon cubes. Hot sauce and Tabasco sauce. Premade or packaged marinades. Premade or packaged taco seasonings. Relishes. Regular salad dressings. Where to find more information:  National Heart, Lung, and Blood Institute: www.nhlbi.nih.gov  American Heart Association: www.heart.org Summary  The DASH eating plan is a healthy eating plan that has been shown to reduce high blood pressure (hypertension). It may also reduce your risk for type 2 diabetes, heart disease, and stroke.  With the DASH eating plan, you should limit salt (sodium) intake to 2,300 mg a day. If you have hypertension, you may need to reduce your sodium intake to 1,500 mg a day.  When on the DASH eating plan, aim to eat more fresh fruits and vegetables, whole grains, lean proteins, low-fat dairy, and heart-healthy fats.  Work with your health care provider or diet and nutrition specialist (dietitian) to adjust your eating plan to your individual calorie needs. This information is  not intended to replace advice given to you by your health care provider. Make sure you discuss any questions you have with your health care provider. Document Released: 11/18/2011 Document Revised: 11/11/2017 Document Reviewed: 11/22/2016 Elsevier Patient Education  2020 [employer]  

## 2019-11-27 NOTE — Progress Notes (Signed)
Established Patient Office Visit  Subjective:  Patient ID: Justin Landry, male    DOB: 1961/04/21  Age: 58 y.o. MRN: [id number]  CC:  Chief Complaint  Patient presents with  . Diabetes  . Hypertension    HPI [name] presents for follow up of hypertension and type 2 diabetes. He states that he's compliant with his medications and continues to work on his diet. He brought his blood pressure log, which he was not consistent checking blood pressure daily. The few he checked, SBP ranges between 165-197 and DBP ranges between 97-117. His blood pressure this morning was 159/92 which was an improvement from his last visit. He denies chest pain, palpitation, light headedness, peripheral edema and myalgia.   His HgbA1c done on 10/03/2019 was 8.8%, he states that he checks his blood glucose at least once a day, but he has not checked it in 4 days because his glucometer wasn't working. He states that his fasting readings are usually between 200-230 mg/dl. His blood glucose reading done during visit was 97 mg/dl and his glucometer was fixed. He admits to performing daily foot checks and taking gabapentin 400 mg tid which relieves his right shoulder pain and peripheral neuropathy. According to Dr Bonnell Public on 11/01/2019, his HgbA1c needs to be less or equal to 8%  before they will perform right shoulder athroplasty. He reports that he's doing well and offers no further complaint.   Past Medical History:  Diagnosis Date  . Diabetes mellitus   . High cholesterol   . Humerus fracture    right  . Hypertension   . Inguinal hernia     Past Surgical History:  Procedure Laterality Date  . AMPUTATION TOE Left 06/23/2019   Procedure: AMPUTATION TOE LEFT AMPUTATION;  Surgeon: Sharlotte Alamo, DPM;  Location: [facility];  Service: Podiatry;  Laterality: Left;  . AMPUTATION TOE Left 08/31/2019   Procedure: AMPUTATION TOE  IPJ [id number];  Surgeon: Sharlotte Alamo, DPM;  Location: [facility];  Service:  Podiatry;  Laterality: Left;  . CYSTOSCOPY  2015   Biopsy  . HERNIA REPAIR Right 2008   [place]    Family History  Problem Relation Age of Onset  . Diabetes Mother   . Hypertension Mother   . Diabetes Father   . Hypertension Father   . Cancer Brother        brain    Social History   Socioeconomic History  . Marital status: Single    Spouse name: Not on file  . Number of children: Not on file  . Years of education: Not on file  . Highest education level: Not on file  Occupational History  . Not on file  Tobacco Use  . Smoking status: Current Every Day Smoker    Packs/day: 1.00    Types: Cigarettes  . Smokeless tobacco: Never Used  Substance and Sexual Activity  . Alcohol use: Yes    Alcohol/week: 2.0 standard drinks    Types: 2 Cans of beer per week    Comment: occasional  . Drug use: Yes    Types: Marijuana  . Sexual activity: Yes  Other Topics Concern  . Not on file  Social History Narrative  . Not on file   Social Determinants of Health   Financial Resource Strain:   . Difficulty of Paying Living Expenses: Not on file  Food Insecurity:   . Worried About Charity fundraiser in the Last Year: Not on file  . Ran  Out of Food in the Last Year: Not on file  Transportation Needs:   . Lack of Transportation (Medical): Not on file  . Lack of Transportation (Non-Medical): Not on file  Physical Activity:   . Days of Exercise per Week: Not on file  . Minutes of Exercise per Session: Not on file  Stress:   . Feeling of Stress : Not on file  Social Connections:   . Frequency of Communication with Friends and Family: Not on file  . Frequency of Social Gatherings with Friends and Family: Not on file  . Attends Religious Services: Not on file  . Active Member of Clubs or Organizations: Not on file  . Attends Archivist Meetings: Not on file  . Marital Status: Not on file  Intimate Partner Violence:   . Fear of Current or Ex-Partner: Not on file  .  Emotionally Abused: Not on file  . Physically Abused: Not on file  . Sexually Abused: Not on file    Outpatient Medications Prior to Visit  Medication Sig Dispense Refill  . Cyanocobalamin (VITAMIN B 12 PO) Take 1 tablet by mouth daily.    Marland Kitchen glipiZIDE (GLUCOTROL) 5 MG tablet TAKE ONE-HALF TABLET (5 MG) BY MOUTH EVERY DAY BEFORE BREAKFAST 30 tablet 0  . Glucosamine-Chondroit-Vit C-Mn (GLUCOSAMINE 1500 COMPLEX PO) Take 1 tablet by mouth daily.    Marland Kitchen amLODipine (NORVASC) 10 MG tablet Take 1 tablet (10 mg total) by mouth daily. 30 tablet 0  . gabapentin (NEURONTIN) 400 MG capsule TAKE ONE CAPSULE BY MOUTH 3 TIMES A DAY 90 capsule 0  . hydrALAZINE (APRESOLINE) 25 MG tablet Take 0.5 tablets (12.5 mg total) by mouth 2 (two) times daily. 30 tablet 0  . lisinopril (ZESTRIL) 40 MG tablet TAKE ONE TABLET (40MG) BY MOUTH EVERY DAY 30 tablet 0  . metFORMIN (GLUCOPHAGE) 1000 MG tablet Take 1 tablet (1,000 mg total) by mouth 2 (two) times daily with a meal. 60 tablet 1  . rosuvastatin (CRESTOR) 5 MG tablet Take 1 tablet (5 mg total) by mouth daily at 6 PM. 30 tablet 0  . Blood Pressure KIT 1 kit by Does not apply route daily. 1 kit 0  . Fish Oil-Cholecalciferol (FISH OIL + D3) 1000-1000 MG-UNIT CAPS Take 1 capsule by mouth daily with breakfast.      No facility-administered medications prior to visit.    Allergies  Allergen Reactions  . Morphine And Related Nausea And Vomiting and Other (See Comments)    Extreme sweating   . Oxycontin [Oxycodone Hcl] Other (See Comments)    Extreme NAusea and vomitting follows (Patient Tolerates Percocet short acting)  . Penicillins Nausea And Vomiting    Has patient had a PCN reaction causing immediate rash, facial/tongue/throat swelling, SOB or lightheadedness with hypotension: No Has patient had a PCN reaction causing severe rash involving mucus membranes or skin necrosis: No Has patient had a PCN reaction that required hospitalization No Has patient had a PCN  reaction occurring within the last 10 years: No If all of the above answers are "NO", then may proceed with Cephalosporin use.   . Tramadol Nausea Only    ROS Review of Systems  Constitutional: Negative.   Respiratory: Negative.   Cardiovascular: Negative.   Gastrointestinal: Negative.   Genitourinary: Negative.   Skin: Negative.   Neurological: Negative.   Psychiatric/Behavioral: Negative.       Objective:    Physical Exam  Constitutional: He is oriented to person, place, and time. He appears  well-developed.  HENT:  Head: Normocephalic and atraumatic.  Eyes: Pupils are equal, round, and reactive to light. EOM are normal.  Cardiovascular: Normal rate and regular rhythm.  Pulmonary/Chest: Effort normal and breath sounds normal.  Neurological: He is alert and oriented to person, place, and time.  Skin: Skin is warm and dry.  Psychiatric: He has a normal mood and affect. His behavior is normal. Judgment and thought content normal.    BP (!) 159/92 (BP Location: Left Arm, Patient Position: Sitting)   Pulse 84   Ht 5' 7"  (1.702 m)   Wt 209 lb 4.8 oz (94.9 kg)   SpO2 98%   BMI 32.78 kg/m  Wt Readings from Last 3 Encounters:  11/27/19 209 lb 4.8 oz (94.9 kg)  11/14/19 208 lb 11.2 oz (94.7 kg)  11/13/19 209 lb (94.8 kg)   He was encouraged to continue on his weight loss regimen.  Health Maintenance Due  Topic Date Due  . Hepatitis C Screening  May 03, 1961  . PNEUMOCOCCAL POLYSACCHARIDE VACCINE AGE 8-64 HIGH RISK  10/11/1964  . OPHTHALMOLOGY EXAM  10/11/1972  . HIV Screening  10/11/1977  . COLONOSCOPY  10/11/2012    There are no preventive care reminders to display for this patient.  No results found for: TSH Lab Results  Component Value Date   WBC 11.1 (H) 08/28/2019   HGB 15.7 08/28/2019   HCT 45.9 08/28/2019   MCV 89.6 08/28/2019   PLT 299 08/28/2019   Lab Results  Component Value Date   NA 134 10/03/2019   K 4.8 10/03/2019   CO2 22 10/03/2019    GLUCOSE 229 (H) 10/03/2019   BUN 11 10/03/2019   CREATININE 0.94 10/03/2019   BILITOT 0.3 10/03/2019   ALKPHOS 81 10/03/2019   AST 21 10/03/2019   ALT 31 10/03/2019   PROT 7.3 10/03/2019   ALBUMIN 4.5 10/03/2019   CALCIUM 9.8 10/03/2019   ANIONGAP 10 08/28/2019   GFR 87.21 05/27/2015   Lab Results  Component Value Date   CHOL 207 (H) 10/03/2019   Lab Results  Component Value Date   HDL 32 (L) 10/03/2019   Lab Results  Component Value Date   LDLCALC 99 10/03/2019   Lab Results  Component Value Date   TRIG 453 (H) 10/03/2019   Lab Results  Component Value Date   CHOLHDL 6.5 (H) 10/03/2019   Lab Results  Component Value Date   HGBA1C 8.8 (H) 10/03/2019      Assessment & Plan:   1. Essential hypertension - His blood pressure is not controlled, but has improved. His Hydralazine will be increased to 25 mg bid, and he will continue taking his other medications.  - He was encouraged to continue on Low salt DASH diet -Take medications regularly on time -Exercise regularly as tolerated -Check blood pressure daily at home,record and bring log to office visit. -Goal is less than 140/90 and normal blood pressure is less than 120/80 - amLODipine (NORVASC) 10 MG tablet; Take 1 tablet (10 mg total) by mouth daily.  Dispense: 30 tablet; Refill: 3 - lisinopril (ZESTRIL) 40 MG tablet; TAKE ONE TABLET (40MG) BY MOUTH EVERY DAY  Dispense: 30 tablet; Refill: 3 - Basic Metabolic Panel (BMET); Future - Urine Microalbumin w/creat. ratio; Future - hydrALAZINE (APRESOLINE) 25 MG tablet; Take 1 tablet (25 mg total) by mouth 2 (two) times daily.  Dispense: 60 tablet; Refill: 1  2. Right shoulder pain, unspecified chronicity - He will follow up with Orthopedic for right shoulder arthroplasty  when his HgbA1c is less than 8%. He will continue on current treatment regimen. - gabapentin (NEURONTIN) 400 MG capsule; Take 1 capsule (400 mg total) by mouth 3 (three) times daily.  Dispense: 90  capsule; Refill: 1  3. Type 2 diabetes mellitus without complication, without long-term current use of insulin (HCC) - His blood glucose during visit was 97 mg/dl, his goal fasting blood glucose should be between 80-130 mg/dl. He will continue on current treatment regimen. - He was advised to continue on Diabetic diet  -Check blood sugar 2 times a day, once before breakfast and others 2 hours after lunch or dinner,write down the numbers against date in a log,bring log to clinic every visit -Take medications regularly as advised -Regular exercise as tolerated. - metFORMIN (GLUCOPHAGE) 1000 MG tablet; Take 1 tablet (1,000 mg total) by mouth 2 (two) times daily with a meal.  Dispense: 60 tablet; Refill: 3 - HgB A1c; Future - Urine Microalbumin w/creat. ratio; Future  4. Elevated lipids - He will continue on current treatment regimen and was advised to continue on -Low fat Diet, like low fat dairy products eg skimmed milk -Avoid any fried food -Regular exercise/walk -Goal for Total Cholesterol is less than 200 -Goal for bad cholesterol LDL is less than 70 -Goal for Good cholesterol HDL is more than 45 -Goal for Triglyceride is less than 150 - rosuvastatin (CRESTOR) 5 MG tablet; Take 1 tablet (5 mg total) by mouth daily at 6 PM.  Dispense: 30 tablet; Refill: 3 - Lipid panel; Future     Follow-up: Return in about 7 weeks (around 01/16/2020), or if symptoms worsen or fail to improve.    Kida Digiulio Jerold Coombe, NP

## 2019-12-04 ENCOUNTER — Ambulatory Visit: Payer: Self-pay | Admitting: Licensed Clinical Social Worker

## 2019-12-06 IMAGING — CT CT SHOULDER*R* W/O CM
1 of 2 series · 9 of 14 positions shown, 12 images · non-contrast
Comparison: Right shoulder x-rays dated August 11, 2019.

CLINICAL DATA: Chronic shoulder pain.

EXAM:
CT OF THE UPPER RIGHT EXTREMITY WITHOUT CONTRAST
TECHNIQUE: Multidetector CT imaging of the right shoulder was performed
according to the standard protocol.

[Series 3: soft thin · axial · 0.50mm/px · z∈[-225,-72]mm · 9 of 321 slices shown, 12 images]
[im 33/321  soft-tissue]
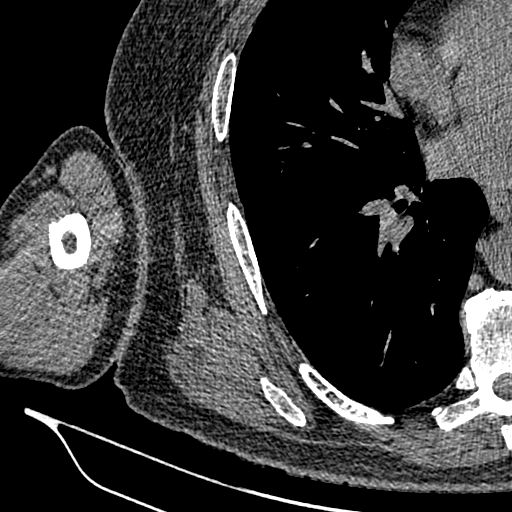
[im 33/321  bone]
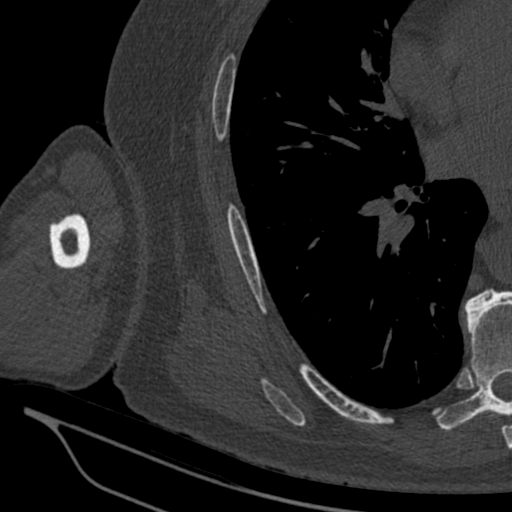
[im 65/321  bone]
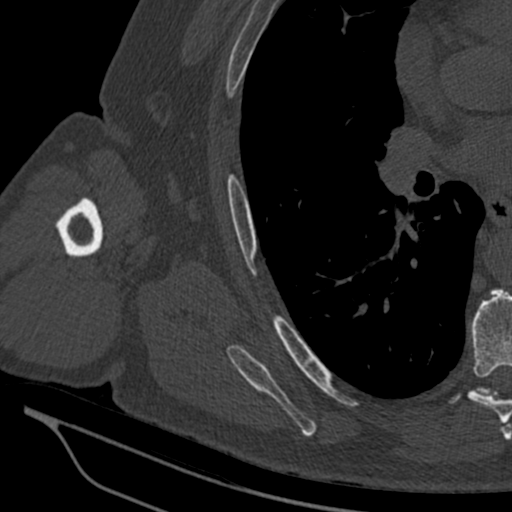
[im 97/321  bone]
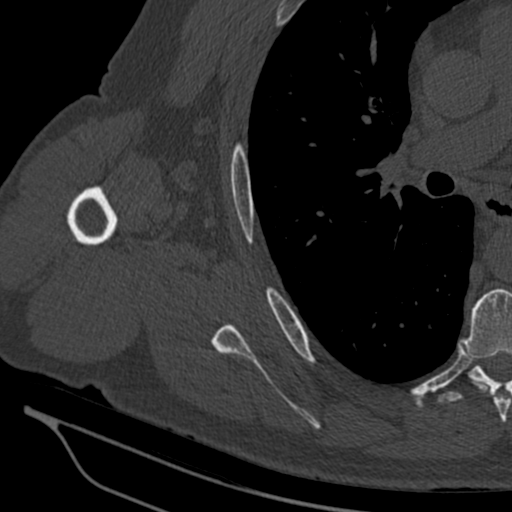
[im 129/321  bone]
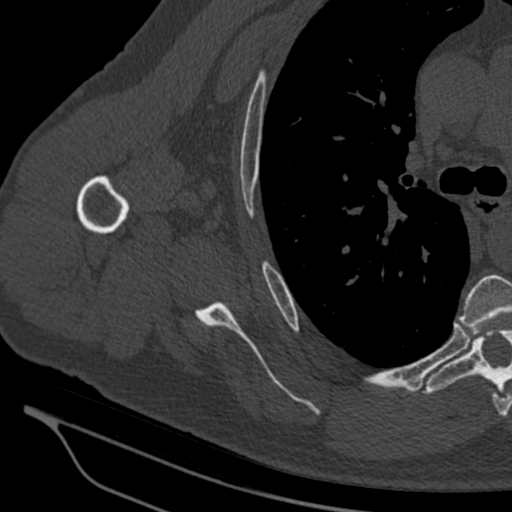
[im 161/321  soft-tissue]
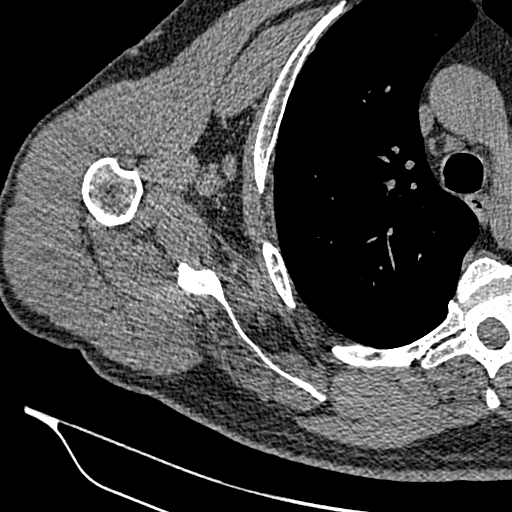
[im 161/321  bone]
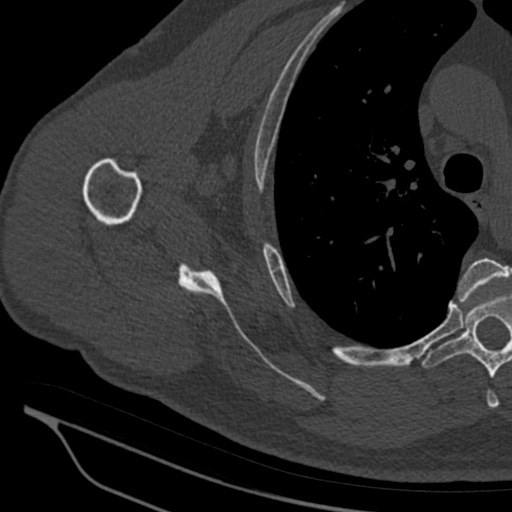
[im 193/321  bone]
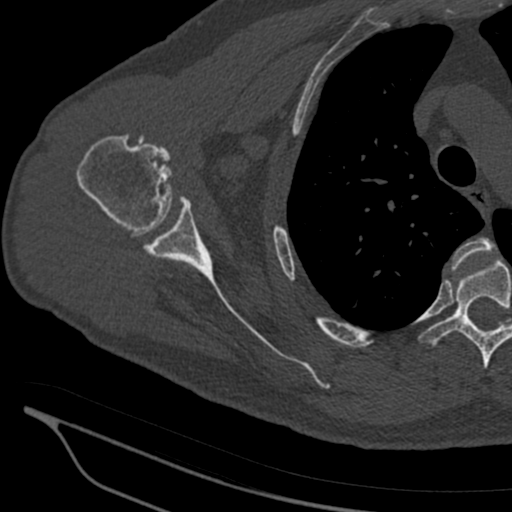
[im 225/321  bone]
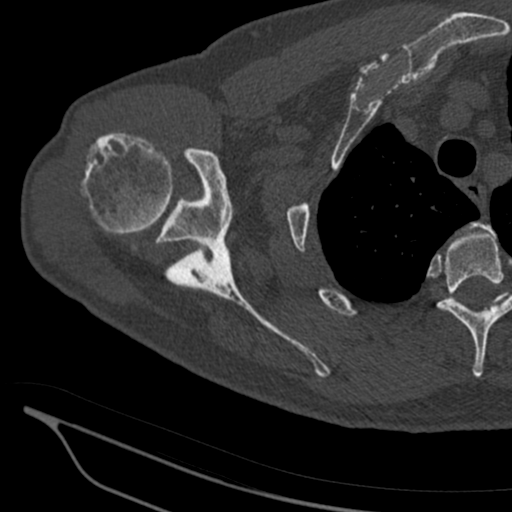
[im 257/321  bone]
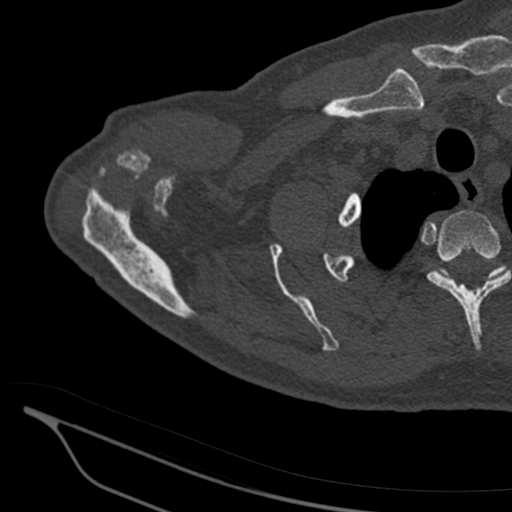
[im 289/321  soft-tissue]
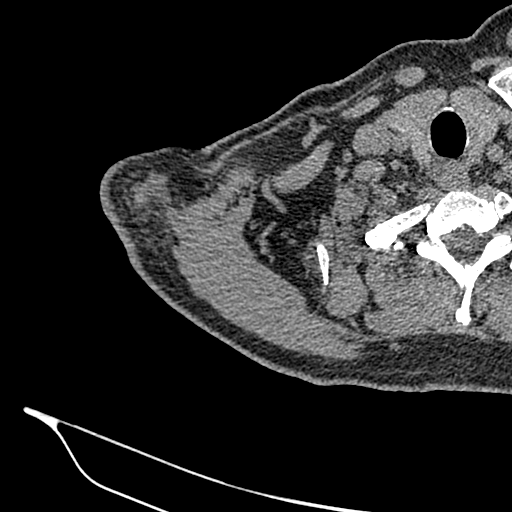
[im 289/321  bone]
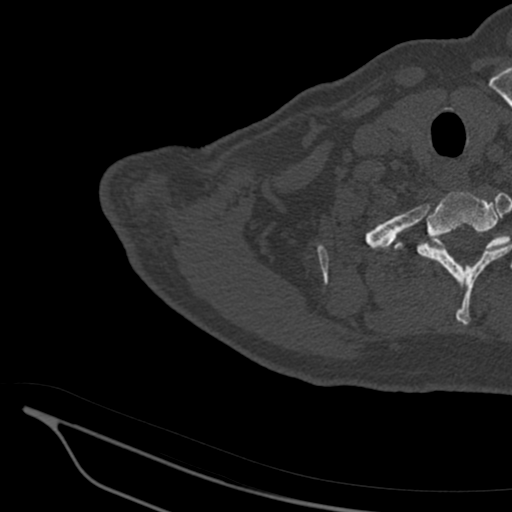

[9 of 14 positions shown; findings below may reference images not displayed]

FINDINGS: Bones/Joint/Cartilage

No fracture or dislocation. High-riding humeral head. No joint
effusion.

Mild osteoarthritis of the glenohumeral joint with mild joint space
narrowing and marginal osteophytosis. Prominent subcortical cystic
change in the greater and lesser tuberosities.

Moderate arthropathy of the acromioclavicular joint. Type II
acromion.

Ligaments

Ligaments are suboptimally evaluated by CT.

Muscles and Tendons
Moderate to severe subscapularis and infraspinatus muscle atrophy.
Mild supraspinatus muscle atrophy. Full-thickness supraspinatus and
infraspinatus tendon tears.

Soft tissue
No fluid collection or hematoma. No soft tissue mass. 6 mm
triangular nodule along the right minor fissure, likely a pulmonary
lymph node. The visualized right lung is otherwise clear.
IMPRESSION: 1. Mild glenohumeral osteoarthritis.
2. Moderate acromioclavicular osteoarthritis.
3. Full-thickness supraspinatus and infraspinatus tendon tears.
4. Moderate to severe subscapularis and infraspinatus muscle
atrophy. Mild supraspinatus muscle atrophy.

## 2019-12-20 ENCOUNTER — Other Ambulatory Visit: Payer: Self-pay

## 2019-12-20 ENCOUNTER — Ambulatory Visit: Payer: Self-pay | Admitting: Licensed Clinical Social Worker

## 2019-12-20 DIAGNOSIS — F411 Generalized anxiety disorder: Secondary | ICD-10-CM

## 2019-12-20 DIAGNOSIS — F1220 Cannabis dependence, uncomplicated: Secondary | ICD-10-CM

## 2019-12-20 DIAGNOSIS — F6381 Intermittent explosive disorder: Secondary | ICD-10-CM

## 2019-12-20 NOTE — BH Specialist Note (Signed)
Integrated Behavioral Health Follow Up Visit Via Phone  MRN: [id number] Name: Justin Landry  Number of Integrated Behavioral Health Clinician visits: 1/6  Type of Service: Integrated Behavioral Health- Individual/Family Interpretor:No. Interpretor Name and Language: not applicable.   SUBJECTIVE: Travante Knee is a 59 y.o. male accompanied by himself. Patient was referred by  Patient reports the following symptoms/concerns: He notes that he has been doing okay. He explains that he has been looking for work but has only been able to find 3 hours of work in the past few weeks. He explains that he tries to stay away from people to avoid getting mad or angry to prevent getting into any trouble. He notes that he got tested for COVID 19 due to not feeling well but tested negative. He explains that he is still not feeling well, experiencing nausea, loss of taste, cough, shortness of breath, fatigue, and weakness. He describes feeling anxious about wondering what he needs to do to get back where he needs to be. He denies suicidal and homicidal thoughts.  Duration of problem: ; Severity of problem: moderate  OBJECTIVE: Mood: Euthymic and Affect: Appropriate Risk of harm to self or others: No plan to harm self or others  LIFE CONTEXT: Family and Social: see above. School/Work: see above. Self-Care: see above. Life Changes: see above.  GOALS ADDRESSED: Patient will: 1.  Reduce symptoms of: anxiety  2.  Increase knowledge and/or ability of: healthy habits and self-management skills  3.  Demonstrate ability to: Increase healthy adjustment to current life circumstances  INTERVENTIONS: Interventions utilized:  Supportive Counseling and Link to [employer] was utilized by the clinician during today's follow up session. Clinician processed with the patient regarding how he has been doing since the last follow up session. Clinician suggested that if the patient is still feeling  unwell to contact the place where he received his COVID 19 test. Clinician provided the patient with the address and contact information for [facility] to be able to inquire about services for assisting him with finding employment Standardized Assessments completed: GAD-7 and PHQ 9  ASSESSMENT: Patient currently experiencing see above.  Patient may benefit from see above.  PLAN: 1. Follow up with behavioral health clinician on : two weeks or earlier if needed.  2. Behavioral recommendations: Clinician provided address and contact information to [facility] to the patient to get assistance with finding employment. [address] [place] [address]. [phone]. 3. Referral(s): [facility] [employer] (In Clinic) 4. "From scale of 1-10, how likely are you to follow plan?":   Althia Forts, LCSW

## 2019-12-26 ENCOUNTER — Emergency Department
Admission: EM | Admit: 2019-12-26 | Discharge: 2019-12-26 | Disposition: A | Discharge status: Home / Self Care | Payer: No Typology Code available for payment source | Attending: Emergency Medicine | Admitting: Emergency Medicine

## 2019-12-26 ENCOUNTER — Encounter: Payer: Self-pay | Admitting: *Deleted

## 2019-12-26 ENCOUNTER — Emergency Department: Payer: No Typology Code available for payment source

## 2019-12-26 ENCOUNTER — Other Ambulatory Visit: Payer: Self-pay

## 2019-12-26 DIAGNOSIS — M25562 Pain in left knee: Secondary | ICD-10-CM

## 2019-12-26 DIAGNOSIS — E119 Type 2 diabetes mellitus without complications: Secondary | ICD-10-CM | POA: Insufficient documentation

## 2019-12-26 DIAGNOSIS — M1712 Unilateral primary osteoarthritis, left knee: Secondary | ICD-10-CM

## 2019-12-26 DIAGNOSIS — Z7984 Long term (current) use of oral hypoglycemic drugs: Secondary | ICD-10-CM | POA: Insufficient documentation

## 2019-12-26 DIAGNOSIS — Z79899 Other long term (current) drug therapy: Secondary | ICD-10-CM | POA: Insufficient documentation

## 2019-12-26 DIAGNOSIS — F17210 Nicotine dependence, cigarettes, uncomplicated: Secondary | ICD-10-CM | POA: Insufficient documentation

## 2019-12-26 DIAGNOSIS — I10 Essential (primary) hypertension: Secondary | ICD-10-CM | POA: Insufficient documentation

## 2019-12-26 MED ORDER — MELOXICAM 7.5 MG PO TABS
15.0000 mg | ORAL_TABLET | Freq: Once | ORAL | Status: AC
  Administered 2019-12-26: 15 mg via ORAL
  Filled 2019-12-26: qty 2

## 2019-12-26 MED ORDER — CYCLOBENZAPRINE HCL 5 MG PO TABS: 5.0000 mg | ORAL_TABLET | Freq: Every day | ORAL | 0 refills | Status: DC

## 2019-12-26 MED ORDER — MELOXICAM 15 MG PO TABS: 15.0000 mg | ORAL_TABLET | Freq: Every day | ORAL | 0 refills | Status: AC
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
## 2019-12-26 NOTE — ED Triage Notes (Signed)
Pt ambulatory to triage.  Pt has left knee pain.  Pt reports knee popping.  Pt is wearing a knee brace.  Pt alert   Speech clear.

## 2019-12-26 NOTE — ED Provider Notes (Signed)
[facility] Emergency Department Provider Note ____________________________________________  Time seen: 2046  I have reviewed the triage vital signs and the nursing notes.  HISTORY  Chief Complaint  Knee Pain  HPI Justin Landry is a 59 y.o. male presents himself to the ED for evaluation of acute left knee pain.   Patient describes pain to the left knee as well as popping.  He describes that he was walking up some steps into his friend's home, when he felt his knee was going to give way on him.  He denies any fall or outright injury.  His friend had a hinged knee brace that he allowed him to wear in the interim.  Patient denies any chronic ongoing knee pain.  He gets a remote history of a previous ligamentous injury some 20+ years prior.  He was referred for surgical intervention, but the patient declined at that time.  Past Medical History:  Diagnosis Date  . Diabetes mellitus   . High cholesterol   . Humerus fracture    right  . Hypertension   . Inguinal hernia     Patient Active Problem List   Diagnosis Date Noted  . Inguinodynia, right 11/13/2019  . Elevated lipids 10/04/2019  . Diabetic ulcer of toe of left foot associated with diabetes mellitus due to underlying condition ([place]) 08/23/2019  . History of right inguinal hernia 08/14/2019  . Encounter to establish care 08/14/2019  . Anxiety 08/14/2019  . Right shoulder pain 08/14/2019  . Cellulitis 06/22/2019  . Essential hypertension 05/27/2015  . Type 2 diabetes mellitus without complication ([place]) 97/35/3299  . Inguinal hernia, bilateral  04/10/2014    Past Surgical History:  Procedure Laterality Date  . AMPUTATION TOE Left 06/23/2019   Procedure: AMPUTATION TOE LEFT AMPUTATION;  Surgeon: Sharlotte Alamo, DPM;  Location: [facility] ORS;  Service: Podiatry;  Laterality: Left;  . AMPUTATION TOE Left 08/31/2019   Procedure: AMPUTATION TOE  IPJ [id number];  Surgeon: Sharlotte Alamo, DPM;  Location: [facility] ORS;   Service: Podiatry;  Laterality: Left;  . CYSTOSCOPY  2015   Biopsy  . HERNIA REPAIR Right 2008   [place]    Prior to Admission medications   Medication Sig Start Date End Date Taking? Authorizing Provider  amLODipine (NORVASC) 10 MG tablet Take 1 tablet (10 mg total) by mouth daily. 11/27/19   Iloabachie, Chioma E, NP  Blood Pressure KIT 1 kit by Does not apply route daily. 10/04/19   Iloabachie, Chioma E, NP  Cyanocobalamin (VITAMIN B 12 PO) Take 1 tablet by mouth daily.    [provider]  cyclobenzaprine (FLEXERIL) 5 MG tablet Take 1 tablet (5 mg total) by mouth at bedtime. 12/26/19   Sharonica Kraszewski, Dannielle Karvonen, PA-C  Fish Oil-Cholecalciferol (FISH OIL + D3) 1000-1000 MG-UNIT CAPS Take 1 capsule by mouth daily with breakfast.     [provider]  gabapentin (NEURONTIN) 400 MG capsule Take 1 capsule (400 mg total) by mouth 3 (three) times daily. 11/27/19   Iloabachie, Chioma E, NP  glipiZIDE (GLUCOTROL) 5 MG tablet TAKE ONE-HALF TABLET (5 MG) BY MOUTH EVERY DAY BEFORE BREAKFAST 11/14/19   Iloabachie, Chioma E, NP  Glucosamine-Chondroit-Vit C-Mn (GLUCOSAMINE 1500 COMPLEX PO) Take 1 tablet by mouth daily.    [provider]  hydrALAZINE (APRESOLINE) 25 MG tablet Take 1 tablet (25 mg total) by mouth 2 (two) times daily. 11/27/19   Iloabachie, Chioma E, NP  lisinopril (ZESTRIL) 40 MG tablet TAKE ONE TABLET (40MG) BY MOUTH EVERY  DAY 11/27/19   Iloabachie, Chioma E, NP  meloxicam (MOBIC) 15 MG tablet Take 1 tablet (15 mg total) by mouth daily. 12/26/19 01/25/20  Duston Smolenski, Dannielle Karvonen, PA-C  metFORMIN (GLUCOPHAGE) 1000 MG tablet Take 1 tablet (1,000 mg total) by mouth 2 (two) times daily with a meal. 11/27/19   Iloabachie, Chioma E, NP  rosuvastatin (CRESTOR) 5 MG tablet Take 1 tablet (5 mg total) by mouth daily at 6 PM. 11/27/19   Iloabachie, Chioma E, NP    Allergies Morphine and related, Oxycontin [oxycodone hcl], Penicillins, and Tramadol  Family History  Problem  Relation Age of Onset  . Diabetes Mother   . Hypertension Mother   . Diabetes Father   . Hypertension Father   . Cancer Brother        brain    Social History Social History   Tobacco Use  . Smoking status: Current Every Day Smoker    Packs/day: 1.00    Types: Cigarettes  . Smokeless tobacco: Never Used  Substance Use Topics  . Alcohol use: Yes    Alcohol/week: 2.0 standard drinks    Types: 2 Cans of beer per week    Comment: occasional  . Drug use: Yes    Types: Marijuana    Review of Systems  Constitutional: Negative for fever. Cardiovascular: Negative for chest pain. Respiratory: Negative for shortness of breath. Musculoskeletal: Negative for back pain.  Left knee pain as above. Skin: Negative for rash. Neurological: Negative for headaches, focal weakness or numbness. ____________________________________________  PHYSICAL EXAM:  VITAL SIGNS: ED Triage Vitals  Enc Vitals Group     BP 12/26/19 2020 (!) 178/94     Pulse Rate 12/26/19 2020 98     Resp 12/26/19 2020 18     Temp 12/26/19 2020 98.4 F (36.9 C)     Temp Source 12/26/19 2020 Oral     SpO2 12/26/19 2020 95 %     Weight 12/26/19 2018 208 lb (94.3 kg)     Height 12/26/19 2018 5' 6"  (1.676 m)     Head Circumference --      Peak Flow --      Pain Score 12/26/19 2017 7     Pain Loc --      Pain Edu? --      Excl. in [place]? --     Constitutional: Alert and oriented. Well appearing and in no distress. Head: Normocephalic and atraumatic. Eyes: Conjunctivae are normal. Normal extraocular movements Cardiovascular: Normal rate, regular rhythm. Normal distal pulses. Respiratory: Normal respiratory effort. No wheezes/rales/rhonchi. Musculoskeletal: Normal spinal alignment without midline tenderness or spasm, deformity, or step-off.  Left knee without any obvious deformity, dislocation, or effusion.  Patient with active range of motion to the left knee without difficulty.  No valgus or varus joint stress is  elicited.  No popliteal space fullness is appreciated.  No calf or Achilles tenderness noted distally.  Patient is mildly tender to palpation to the medial inferior knee at the tibial plateau.  Negative anterior/posterior drawer sign.  Nontender with normal range of motion in all extremities.  Neurologic: Cranial nerves II through XII grossly intact.  Normal gait without ataxia. Normal speech and language. No gross focal neurologic deficits are appreciated. Skin:  Skin is warm, dry and intact. No rash noted. ____________________________________________   RADIOLOGY  DG Left Knee IMPRESSION: Bony excrescence along the medial distal femoral metadiaphysis has an appearance which may be on the spectrum of a Pellegrini-Stieda lesion suggesting prior MCL pathology.  No acute fracture or traumatic malalignment.  I, Melvenia Needles, personally viewed and evaluated these images (plain radiographs) as part of my medical decision making, as well as reviewing the written report by the radiologist. ____________________________________________  PROCEDURES  Meloxicam 15 mg PO Procedures ____________________________________________  INITIAL IMPRESSION / ASSESSMENT AND PLAN / ED COURSE  Patient with acute left knee pain, without indication of pre-existing injury, trauma, or accident.  Patient's x-ray does confirm tricompartmental osteoarthritis as well as bone spurs.  Patient's exam does not otherwise elicit any concern for internal derangement.  He will be advised to continue to wear his hinged knee brace for support.  Prescriptions for Flexeril and Loxitane were provided for his benefit.  He is referred to orthopedics for ongoing symptom management.  Return precautions have been reviewed.  Dellis Voght was evaluated in Emergency Department on 12/26/2019 for the symptoms described in the history of present illness. He was evaluated in the context of the global COVID-19 pandemic, which  necessitated consideration that the patient might be at risk for infection with the SARS-CoV-2 virus that causes COVID-19. Institutional protocols and algorithms that pertain to the evaluation of patients at risk for COVID-19 are in a state of rapid change based on information released by regulatory bodies including the CDC and federal and state organizations. These policies and algorithms were followed during the patient's care in the ED. ____________________________________________  FINAL CLINICAL IMPRESSION(S) / ED DIAGNOSES  Final diagnoses:  Acute pain of left knee  Primary osteoarthritis of left knee      Linda Biehn, Dannielle Karvonen, PA-C 12/26/19 2201    Carrie Mew, MD 01/01/20 [phone]

## 2019-12-26 NOTE — ED Notes (Signed)
Pt in x-ray at this time

## 2019-12-26 NOTE — Discharge Instructions (Signed)
Your exam and XR are consistent with arthritis. Take the prescription meds as directed. Follow-up with Ortho for ongoing symptoms.

## 2019-12-28 ENCOUNTER — Other Ambulatory Visit: Payer: Self-pay | Admitting: Gerontology

## 2019-12-28 DIAGNOSIS — E119 Type 2 diabetes mellitus without complications: Secondary | ICD-10-CM

## 2020-01-01 ENCOUNTER — Other Ambulatory Visit: Payer: Self-pay

## 2020-01-01 ENCOUNTER — Ambulatory Visit: Payer: Self-pay

## 2020-01-03 ENCOUNTER — Ambulatory Visit: Payer: Self-pay | Admitting: Licensed Clinical Social Worker

## 2020-01-03 ENCOUNTER — Other Ambulatory Visit: Payer: Self-pay

## 2020-01-08 ENCOUNTER — Ambulatory Visit: Payer: Self-pay | Admitting: Licensed Clinical Social Worker

## 2020-01-08 ENCOUNTER — Other Ambulatory Visit: Payer: Self-pay

## 2020-01-08 DIAGNOSIS — F6381 Intermittent explosive disorder: Secondary | ICD-10-CM

## 2020-01-08 DIAGNOSIS — F411 Generalized anxiety disorder: Secondary | ICD-10-CM

## 2020-01-08 NOTE — BH Specialist Note (Signed)
Integrated Behavioral Health Follow Up Visit Via Phone  MRN: [id number] Name: Zamarian Scarano  Number of Integrated Behavioral Health Clinician visits: 2/6  Type of Service: Integrated Behavioral Health- Individual/Family Interpretor:No. Interpretor Name and Language: not applicable.  SUBJECTIVE: Olanrewaju Osborn is a 59 y.o. male accompanied by himself. Patient was referred by Hurman Horn, NP for mental health.  Patient reports the following symptoms/concerns: He reports that he has been doing a little bit better since his las follow up session. He explains that he has found some work in the last few weeks so he has been glad to make some money, have something to do, and keep his mind occupied. He notes that is mind set has improved and his feeling more motivated. He notes that he has not had any episodes of anger in the last few weeks. He notes that he is hoping to lower his A1C so he can be able to get his surgery on his shoulder. He discussed having problems with his knee. He denies suicidal and homicidal thoughts.  Duration of problem: ; Severity of problem: moderate  OBJECTIVE: Mood: Euthymic and Affect: Appropriate Risk of harm to self or others: No plan to harm self or others  LIFE CONTEXT: Family and Social: see above.  School/Work: see above. Self-Care: see above. Life Changes: see above.  GOALS ADDRESSED: Patient will: 1.  Reduce symptoms of: stress  2.  Increase knowledge and/or ability of: self-management skills  3.  Demonstrate ability to: Increase healthy adjustment to current life circumstances  INTERVENTIONS: Interventions utilized:  Supportive Counseling was utilized by the clinician during today's follow up session. Clinician processed with the patient regarding how she has been doing since the last follow up session. Clinician explained to the patient that it sounds like he is in better spirits since he has picked up some work and has been keeping  busy. Clinician asked the patient if he has had any episodes of anger in the last two weeks. Clinician encouraged the patient to stay positive and focus on what is happening in the present.  Standardized Assessments completed: GAD-7 and PHQ 9  ASSESSMENT: Patient currently experiencing see above.   Patient may benefit from see above.   PLAN: 1. Follow up with behavioral health clinician on : two weeks or earlier if needed. 2. Behavioral recommendations: see above. 3. Referral(s): Integrated [employer] (In Clinic) 4. "From scale of 1-10, how likely are you to follow plan?":   Althia Forts, LCSW

## 2020-01-09 ENCOUNTER — Other Ambulatory Visit: Payer: Self-pay

## 2020-01-16 ENCOUNTER — Ambulatory Visit: Payer: Self-pay | Admitting: Gerontology

## 2020-01-17 ENCOUNTER — Other Ambulatory Visit: Payer: Self-pay

## 2020-01-17 DIAGNOSIS — E119 Type 2 diabetes mellitus without complications: Secondary | ICD-10-CM

## 2020-01-17 DIAGNOSIS — I10 Essential (primary) hypertension: Secondary | ICD-10-CM

## 2020-01-17 DIAGNOSIS — E785 Hyperlipidemia, unspecified: Secondary | ICD-10-CM

## 2020-01-18 LAB — LIPID PANEL
Chol/HDL Ratio: 4.1 ratio (ref 0.0–5.0)
Cholesterol, Total: 136 mg/dL (ref 100–199)
HDL: 33 mg/dL — ABNORMAL LOW (ref >39)
LDL Chol Calc (NIH): 49 mg/dL (ref 0–99)
Triglycerides: 357 mg/dL — ABNORMAL HIGH (ref 0–149)
VLDL Cholesterol Cal: 54 mg/dL — ABNORMAL HIGH (ref 5–40)

## 2020-01-18 LAB — HEMOGLOBIN A1C
Est. average glucose Bld gHb Est-mCnc: 209 mg/dL
Hgb A1c MFr Bld: 8.9 % — ABNORMAL HIGH (ref 4.8–5.6)

## 2020-01-18 LAB — BASIC METABOLIC PANEL
BUN/Creatinine Ratio: 17 (ref 9–20)
BUN: 14 mg/dL (ref 6–24)
CO2: 24 mmol/L (ref 20–29)
Calcium: 10.1 mg/dL (ref 8.7–10.2)
Chloride: 100 mmol/L (ref 96–106)
Creatinine, Ser: 0.83 mg/dL (ref 0.76–1.27)
GFR calc Af Amer: 113 mL/min/{1.73_m2} (ref >59)
GFR calc non Af Amer: 98 mL/min/{1.73_m2} (ref >59)
Glucose: 156 mg/dL — ABNORMAL HIGH (ref 65–99)
Potassium: 4.5 mmol/L (ref 3.5–5.2)
Sodium: 140 mmol/L (ref 134–144)

## 2020-01-18 LAB — MICROALBUMIN / CREATININE URINE RATIO
Creatinine, Urine: 107 mg/dL
Microalb/Creat Ratio: 206 mg/g creat — ABNORMAL HIGH (ref 0–29)
Microalbumin, Urine: 220.1 ug/mL

## 2020-01-22 ENCOUNTER — Ambulatory Visit: Payer: Self-pay | Admitting: Licensed Clinical Social Worker

## 2020-01-22 ENCOUNTER — Telehealth: Payer: Self-pay | Admitting: Licensed Clinical Social Worker

## 2020-01-22 NOTE — Telephone Encounter (Signed)
Clinician attempted to contact the patient twice during their scheduled phone visit. His voicemail box has not been set up with so there was not an option to leave a voicemail.

## 2020-01-24 ENCOUNTER — Ambulatory Visit: Payer: Self-pay | Admitting: Gerontology

## 2020-01-25 ENCOUNTER — Other Ambulatory Visit: Payer: Self-pay | Admitting: Gerontology

## 2020-01-25 DIAGNOSIS — M25511 Pain in right shoulder: Secondary | ICD-10-CM

## 2020-01-29 ENCOUNTER — Telehealth: Payer: Self-pay | Admitting: Gerontology

## 2020-01-29 NOTE — Telephone Encounter (Signed)
Patient answered the phone on the third ring and was able to step out of work to r/s his missed appt

## 2020-01-31 ENCOUNTER — Ambulatory Visit: Payer: Self-pay | Admitting: Gerontology

## 2020-02-04 ENCOUNTER — Ambulatory Visit: Payer: Self-pay | Admitting: Gerontology

## 2020-02-06 ENCOUNTER — Ambulatory Visit: Payer: Self-pay | Admitting: Gerontology

## 2020-02-07 ENCOUNTER — Ambulatory Visit: Payer: Self-pay | Admitting: Gerontology

## 2020-02-07 ENCOUNTER — Other Ambulatory Visit: Payer: Self-pay

## 2020-02-07 DIAGNOSIS — G8929 Other chronic pain: Secondary | ICD-10-CM

## 2020-02-07 DIAGNOSIS — F419 Anxiety disorder, unspecified: Secondary | ICD-10-CM

## 2020-02-07 DIAGNOSIS — E119 Type 2 diabetes mellitus without complications: Secondary | ICD-10-CM

## 2020-02-07 DIAGNOSIS — M25562 Pain in left knee: Secondary | ICD-10-CM | POA: Insufficient documentation

## 2020-02-07 DIAGNOSIS — M25511 Pain in right shoulder: Secondary | ICD-10-CM

## 2020-02-07 DIAGNOSIS — I10 Essential (primary) hypertension: Secondary | ICD-10-CM

## 2020-02-07 MED ORDER — HYDRALAZINE HCL 25 MG PO TABS: 25.0000 mg | ORAL_TABLET | Freq: Three times a day (TID) | ORAL | 1 refills | Status: DC

## 2020-02-07 MED ORDER — GABAPENTIN 400 MG PO CAPS: 400.0000 mg | ORAL_CAPSULE | Freq: Three times a day (TID) | ORAL | 3 refills | Status: DC

## 2020-02-07 MED ORDER — GLIPIZIDE 5 MG PO TABS: 5.0000 mg | ORAL_TABLET | Freq: Two times a day (BID) | ORAL | 0 refills | Status: DC
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
## 2020-02-07 NOTE — Patient Instructions (Signed)
Carbohydrate Counting for Diabetes Mellitus, Adult  Carbohydrate counting is a method of keeping track of how many carbohydrates you eat. Eating carbohydrates naturally increases the amount of sugar (glucose) in the blood. Counting how many carbohydrates you eat helps keep your blood glucose within normal limits, which helps you manage your diabetes (diabetes mellitus). It is important to know how many carbohydrates you can safely have in each meal. This is different for every person. A diet and nutrition specialist (registered dietitian) can help you make a meal plan and calculate how many carbohydrates you should have at each meal and snack. Carbohydrates are found in the following foods:  Grains, such as breads and cereals.  Dried beans and soy products.  Starchy vegetables, such as potatoes, peas, and corn.  Fruit and fruit juices.  Milk and yogurt.  Sweets and snack foods, such as cake, cookies, candy, chips, and soft drinks. How do I count carbohydrates? There are two ways to count carbohydrates in food. You can use either of the methods or a combination of both. Reading "Nutrition Facts" on packaged food The "Nutrition Facts" list is included on the labels of almost all packaged foods and beverages in the [place] It includes:  The serving size.  Information about nutrients in each serving, including the grams (g) of carbohydrate per serving. To use the "Nutrition Facts":  Decide how many servings you will have.  Multiply the number of servings by the number of carbohydrates per serving.  The resulting number is the total amount of carbohydrates that you will be having. Learning standard serving sizes of other foods When you eat carbohydrate foods that are not packaged or do not include "Nutrition Facts" on the label, you need to measure the servings in order to count the amount of carbohydrates:  Measure the foods that you will eat with a food scale or measuring cup, if needed.   Decide how many standard-size servings you will eat.  Multiply the number of servings by 15. Most carbohydrate-rich foods have about 15 g of carbohydrates per serving. ? For example, if you eat 8 oz (170 g) of strawberries, you will have eaten 2 servings and 30 g of carbohydrates (2 servings x 15 g = 30 g).  For foods that have more than one food mixed, such as soups and casseroles, you must count the carbohydrates in each food that is included. The following list contains standard serving sizes of common carbohydrate-rich foods. Each of these servings has about 15 g of carbohydrates:   hamburger bun or  English muffin.   oz (15 mL) syrup.   oz (14 g) jelly.  1 slice of bread.  1 six-inch tortilla.  3 oz (85 g) cooked rice or pasta.  4 oz (113 g) cooked dried beans.  4 oz (113 g) starchy vegetable, such as peas, corn, or potatoes.  4 oz (113 g) hot cereal.  4 oz (113 g) mashed potatoes or  of a large baked potato.  4 oz (113 g) canned or frozen fruit.  4 oz (120 mL) fruit juice.  4-6 crackers.  6 chicken nuggets.  6 oz (170 g) unsweetened dry cereal.  6 oz (170 g) plain fat-free yogurt or yogurt sweetened with artificial sweeteners.  8 oz (240 mL) milk.  8 oz (170 g) fresh fruit or one small piece of fruit.  24 oz (680 g) popped popcorn. Example of carbohydrate counting Sample meal  3 oz (85 g) chicken breast.  6 oz (170 g)   brown rice.  4 oz (113 g) corn.  8 oz (240 mL) milk.  8 oz (170 g) strawberries with sugar-free whipped topping. Carbohydrate calculation 1. Identify the foods that contain carbohydrates: ? Rice. ? Corn. ? Milk. ? Strawberries. 2. Calculate how many servings you have of each food: ? 2 servings rice. ? 1 serving corn. ? 1 serving milk. ? 1 serving strawberries. 3. Multiply each number of servings by 15 g: ? 2 servings rice x 15 g = 30 g. ? 1 serving corn x 15 g = 15 g. ? 1 serving milk x 15 g = 15 g. ? 1 serving  strawberries x 15 g = 15 g. 4. Add together all of the amounts to find the total grams of carbohydrates eaten: ? 30 g + 15 g + 15 g + 15 g = 75 g of carbohydrates total. Summary  Carbohydrate counting is a method of keeping track of how many carbohydrates you eat.  Eating carbohydrates naturally increases the amount of sugar (glucose) in the blood.  Counting how many carbohydrates you eat helps keep your blood glucose within normal limits, which helps you manage your diabetes.  A diet and nutrition specialist (registered dietitian) can help you make a meal plan and calculate how many carbohydrates you should have at each meal and snack. This information is not intended to replace advice given to you by your health care provider. Make sure you discuss any questions you have with your health care provider. Document Revised: 06/23/2017 Document Reviewed: 05/12/2016 Elsevier Patient Education  2020 [employer] DASH Eating Plan DASH stands for "Dietary Approaches to Stop Hypertension." The DASH eating plan is a healthy eating plan that has been shown to reduce high blood pressure (hypertension). It may also reduce your risk for type 2 diabetes, heart disease, and stroke. The DASH eating plan may also help with weight loss. What are tips for following this plan?  General guidelines  Avoid eating more than 2,300 mg (milligrams) of salt (sodium) a day. If you have hypertension, you may need to reduce your sodium intake to 1,500 mg a day.  Limit alcohol intake to no more than 1 drink a day for nonpregnant women and 2 drinks a day for men. One drink equals 12 oz of beer, 5 oz of wine, or 1 oz of hard liquor.  Work with your health care provider to maintain a healthy body weight or to lose weight. Ask what an ideal weight is for you.  Get at least 30 minutes of exercise that causes your heart to beat faster (aerobic exercise) most days of the week. Activities may include walking, swimming, or  biking.  Work with your health care provider or diet and nutrition specialist (dietitian) to adjust your eating plan to your individual calorie needs. Reading food labels   Check food labels for the amount of sodium per serving. Choose foods with less than 5 percent of the Daily Value of sodium. Generally, foods with less than 300 mg of sodium per serving fit into this eating plan.  To find whole grains, look for the word "whole" as the first word in the ingredient list. Shopping  Buy products labeled as "low-sodium" or "no salt added."  Buy fresh foods. Avoid canned foods and premade or frozen meals. Cooking  Avoid adding salt when cooking. Use salt-free seasonings or herbs instead of table salt or sea salt. Check with your health care provider or pharmacist before using salt substitutes.  Do not   fry foods. Cook foods using healthy methods such as baking, boiling, grilling, and broiling instead.  Cook with heart-healthy oils, such as olive, canola, soybean, or sunflower oil. Meal planning  Eat a balanced diet that includes: ? 5 or more servings of fruits and vegetables each day. At each meal, try to fill half of your plate with fruits and vegetables. ? Up to 6-8 servings of whole grains each day. ? Less than 6 oz of lean meat, poultry, or fish each day. A 3-oz serving of meat is about the same size as a deck of cards. One egg equals 1 oz. ? 2 servings of low-fat dairy each day. ? A serving of nuts, seeds, or beans 5 times each week. ? Heart-healthy fats. Healthy fats called Omega-3 fatty acids are found in foods such as flaxseeds and coldwater fish, like sardines, salmon, and mackerel.  Limit how much you eat of the following: ? Canned or prepackaged foods. ? Food that is high in trans fat, such as fried foods. ? Food that is high in saturated fat, such as fatty meat. ? Sweets, desserts, sugary drinks, and other foods with added sugar. ? Full-fat dairy products.  Do not salt  foods before eating.  Try to eat at least 2 vegetarian meals each week.  Eat more home-cooked food and less restaurant, buffet, and fast food.  When eating at a restaurant, ask that your food be prepared with less salt or no salt, if possible. What foods are recommended? The items listed may not be a complete list. Talk with your dietitian about what dietary choices are best for you. Grains Whole-grain or whole-wheat bread. Whole-grain or whole-wheat pasta. Brown rice. Oatmeal. Quinoa. Bulgur. Whole-grain and low-sodium cereals. Pita bread. Low-fat, low-sodium crackers. Whole-wheat flour tortillas. Vegetables Fresh or frozen vegetables (raw, steamed, roasted, or grilled). Low-sodium or reduced-sodium tomato and vegetable juice. Low-sodium or reduced-sodium tomato sauce and tomato paste. Low-sodium or reduced-sodium canned vegetables. Fruits All fresh, dried, or frozen fruit. Canned fruit in natural juice (without added sugar). Meat and other protein foods Skinless chicken or turkey. Ground chicken or turkey. Pork with fat trimmed off. Fish and seafood. Egg whites. Dried beans, peas, or lentils. Unsalted nuts, nut butters, and seeds. Unsalted canned beans. Lean cuts of beef with fat trimmed off. Low-sodium, lean deli meat. Dairy Low-fat (1%) or fat-free (skim) milk. Fat-free, low-fat, or reduced-fat cheeses. Nonfat, low-sodium ricotta or cottage cheese. Low-fat or nonfat yogurt. Low-fat, low-sodium cheese. Fats and oils Soft margarine without trans fats. Vegetable oil. Low-fat, reduced-fat, or light mayonnaise and salad dressings (reduced-sodium). Canola, safflower, olive, soybean, and sunflower oils. Avocado. Seasoning and other foods Herbs. Spices. Seasoning mixes without salt. Unsalted popcorn and pretzels. Fat-free sweets. What foods are not recommended? The items listed may not be a complete list. Talk with your dietitian about what dietary choices are best for you. Grains Baked goods  made with fat, such as croissants, muffins, or some breads. Dry pasta or rice meal packs. Vegetables Creamed or fried vegetables. Vegetables in a cheese sauce. Regular canned vegetables (not low-sodium or reduced-sodium). Regular canned tomato sauce and paste (not low-sodium or reduced-sodium). Regular tomato and vegetable juice (not low-sodium or reduced-sodium). Pickles. Olives. Fruits Canned fruit in a light or heavy syrup. Fried fruit. Fruit in cream or butter sauce. Meat and other protein foods Fatty cuts of meat. Ribs. Fried meat. Bacon. Sausage. Bologna and other processed lunch meats. Salami. Fatback. Hotdogs. Bratwurst. Salted nuts and seeds. Canned beans with added salt.   Canned or smoked fish. Whole eggs or egg yolks. Chicken or turkey with skin. Dairy Whole or 2% milk, cream, and half-and-half. Whole or full-fat cream cheese. Whole-fat or sweetened yogurt. Full-fat cheese. Nondairy creamers. Whipped toppings. Processed cheese and cheese spreads. Fats and oils Butter. Stick margarine. Lard. Shortening. Ghee. Bacon fat. Tropical oils, such as coconut, palm kernel, or palm oil. Seasoning and other foods Salted popcorn and pretzels. Onion salt, garlic salt, seasoned salt, table salt, and sea salt. Worcestershire sauce. Tartar sauce. Barbecue sauce. Teriyaki sauce. Soy sauce, including reduced-sodium. Steak sauce. Canned and packaged gravies. Fish sauce. Oyster sauce. Cocktail sauce. Horseradish that you find on the shelf. Ketchup. Mustard. Meat flavorings and tenderizers. Bouillon cubes. Hot sauce and Tabasco sauce. Premade or packaged marinades. Premade or packaged taco seasonings. Relishes. Regular salad dressings. Where to find more information:  National Heart, Lung, and Blood Institute: www.nhlbi.nih.gov  American Heart Association: www.heart.org Summary  The DASH eating plan is a healthy eating plan that has been shown to reduce high blood pressure (hypertension). It may also reduce  your risk for type 2 diabetes, heart disease, and stroke.  With the DASH eating plan, you should limit salt (sodium) intake to 2,300 mg a day. If you have hypertension, you may need to reduce your sodium intake to 1,500 mg a day.  When on the DASH eating plan, aim to eat more fresh fruits and vegetables, whole grains, lean proteins, low-fat dairy, and heart-healthy fats.  Work with your health care provider or diet and nutrition specialist (dietitian) to adjust your eating plan to your individual calorie needs. This information is not intended to replace advice given to you by your health care provider. Make sure you discuss any questions you have with your health care provider. Document Revised: 11/11/2017 Document Reviewed: 11/22/2016 Elsevier Patient Education  2020 Else[employer] Inc.  

## 2020-02-08 DIAGNOSIS — F419 Anxiety disorder, unspecified: Secondary | ICD-10-CM | POA: Insufficient documentation

## 2020-02-12 ENCOUNTER — Encounter: Payer: Self-pay | Admitting: *Deleted

## 2020-02-21 ENCOUNTER — Encounter: Payer: Self-pay | Admitting: Gerontology

## 2020-02-21 ENCOUNTER — Other Ambulatory Visit: Payer: Self-pay

## 2020-02-21 ENCOUNTER — Ambulatory Visit: Payer: Self-pay | Admitting: Gerontology

## 2020-02-21 ENCOUNTER — Ambulatory Visit: Payer: Self-pay | Admitting: Licensed Clinical Social Worker

## 2020-02-21 ENCOUNTER — Encounter: Payer: Self-pay | Admitting: Licensed Clinical Social Worker

## 2020-02-21 VITALS — BP 148/91 | HR 89 | Temp 97.2°F | Ht 67.0 in | Wt 206.0 lb

## 2020-02-21 DIAGNOSIS — I10 Essential (primary) hypertension: Secondary | ICD-10-CM

## 2020-02-21 DIAGNOSIS — F6381 Intermittent explosive disorder: Secondary | ICD-10-CM

## 2020-02-21 DIAGNOSIS — E785 Hyperlipidemia, unspecified: Secondary | ICD-10-CM

## 2020-02-21 DIAGNOSIS — M79676 Pain in unspecified toe(s): Secondary | ICD-10-CM | POA: Insufficient documentation

## 2020-02-21 DIAGNOSIS — F411 Generalized anxiety disorder: Secondary | ICD-10-CM

## 2020-02-21 DIAGNOSIS — M79675 Pain in left toe(s): Secondary | ICD-10-CM

## 2020-02-21 DIAGNOSIS — F419 Anxiety disorder, unspecified: Secondary | ICD-10-CM

## 2020-02-21 DIAGNOSIS — E119 Type 2 diabetes mellitus without complications: Secondary | ICD-10-CM

## 2020-02-21 MED ORDER — AMLODIPINE BESYLATE 10 MG PO TABS: 10.0000 mg | ORAL_TABLET | Freq: Every day | ORAL | 3 refills | Status: DC

## 2020-02-21 MED ORDER — GLIPIZIDE 10 MG PO TABS: 10.0000 mg | ORAL_TABLET | Freq: Two times a day (BID) | ORAL | 2 refills | Status: DC

## 2020-02-21 MED ORDER — ROSUVASTATIN CALCIUM 5 MG PO TABS: 5.0000 mg | ORAL_TABLET | Freq: Every day | ORAL | 3 refills | Status: DC

## 2020-02-21 MED ORDER — METFORMIN HCL 1000 MG PO TABS: 1000.0000 mg | ORAL_TABLET | Freq: Two times a day (BID) | ORAL | 3 refills | Status: DC

## 2020-02-21 NOTE — BH Specialist Note (Signed)
Integrated Behavioral Health Follow Up Visit Via Phone  MRN: [id number] Name: Justin Landry  Number of Integrated Behavioral Health Clinician visits: 3/6  Type of Service: Integrated Behavioral Health- Individual/Family Interpretor:No. Interpretor Name and Language: not applicable.  SUBJECTIVE: Justin Landry is a 59 y.o. male accompanied by himself. Patient was referred by Hurman Horn NP for mental health. Patient reports the following symptoms/concerns: He explains that things have been up and down int he last few weeks. He notes that one of his cousins passed away unexpectedly passed away. He notes that he is in a lot of pain in his toe and one is black. He notes that Lanora Manis said she was going to have him see an orthopedist. He explains that the pain and look of his toes is causing him to feel anxious. He explains that he wants to be on something for anxiety. He notes that he had some work for [employer] and does not have anything right now. He notes that he doe snot have any money to put gas in his car.  He denies suicidal and homicidal thoughts.  Duration of problem: ; Severity of problem: moderate  OBJECTIVE: Mood: Euthymic and Affect: Appropriate Risk of harm to self or others: No plan to harm self or others  LIFE CONTEXT: Family and Social: see above. School/Work: see above. Self-Care: see above. Life Changes: see above.   GOALS ADDRESSED: Patient will: 1.  Reduce symptoms of: anxiety  2.  Increase knowledge and/or ability of: self-management skills  3.  Demonstrate ability to: Increase healthy adjustment to current life circumstances  INTERVENTIONS: Interventions utilized:  Brief CBT was utilized by the clinician during today's follow up session. Clinician processed with the patient regarding how he has been doing since the last follow up session. Clinician measured the patient's anxiety on a numerical scale. Clinician completed a [employer] social determinants  of health screening on the patient. Clinician referred the patient to [facility]ocational Rehabilitation for assistance with finding a job. Clinician explained to the patient that she would have a case consultation with Dr. Mare Ferrari, MD, psychiatric consultant on Tuesday March 16th @ 9 am to discuss the option of placing him on a psychotropic medication for his anxiety.  Standardized Assessments completed: GAD-7  ASSESSMENT: Patient currently experiencing see above.   Patient may benefit from see above.  PLAN: 1. Follow up with behavioral health clinician on : two weeks or earlier if needed.  2. Behavioral recommendations: see above. 3. Referral(s): Integrated [facility] (In Clinic) 4. "From scale of 1-10, how likely are you to follow plan?":   Althia Forts, LCSW

## 2020-02-21 NOTE — Patient Instructions (Signed)
Diabetes Mellitus and Nutrition, Adult When you have diabetes (diabetes mellitus), it is very important to have healthy eating habits because your blood sugar (glucose) levels are greatly affected by what you eat and drink. Eating healthy foods in the appropriate amounts, at about the same times every day, can help you:  Control your blood glucose.  Lower your risk of heart disease.  Improve your blood pressure.  Reach or maintain a healthy weight. Every person with diabetes is different, and each person has different needs for a meal plan. Your health care provider may recommend that you work with a diet and nutrition specialist (dietitian) to make a meal plan that is best for you. Your meal plan may vary depending on factors such as:  The calories you need.  The medicines you take.  Your weight.  Your blood glucose, blood pressure, and cholesterol levels.  Your activity level.  Other health conditions you have, such as heart or kidney disease. How do carbohydrates affect me? Carbohydrates, also called carbs, affect your blood glucose level more than any other type of food. Eating carbs naturally raises the amount of glucose in your blood. Carb counting is a method for keeping track of how many carbs you eat. Counting carbs is important to keep your blood glucose at a healthy level, especially if you use insulin or take certain oral diabetes medicines. It is important to know how many carbs you can safely have in each meal. This is different for every person. Your dietitian can help you calculate how many carbs you should have at each meal and for each snack. Foods that contain carbs include:  Bread, cereal, rice, pasta, and crackers.  Potatoes and corn.  Peas, beans, and lentils.  Milk and yogurt.  Fruit and juice.  Desserts, such as cakes, cookies, ice cream, and candy. How does alcohol affect me? Alcohol can cause a sudden decrease in blood glucose (hypoglycemia),  especially if you use insulin or take certain oral diabetes medicines. Hypoglycemia can be a life-threatening condition. Symptoms of hypoglycemia (sleepiness, dizziness, and confusion) are similar to symptoms of having too much alcohol. If your health care provider says that alcohol is safe for you, follow these guidelines:  Limit alcohol intake to no more than 1 drink per day for nonpregnant women and 2 drinks per day for men. One drink equals 12 oz of beer, 5 oz of wine, or 1 oz of hard liquor.  Do not drink on an empty stomach.  Keep yourself hydrated with water, diet soda, or unsweetened iced tea.  Keep in mind that regular soda, juice, and other mixers may contain a lot of sugar and must be counted as carbs. What are tips for following this plan?  Reading food labels  Start by checking the serving size on the "Nutrition Facts" label of packaged foods and drinks. The amount of calories, carbs, fats, and other nutrients listed on the label is based on one serving of the item. Many items contain more than one serving per package.  Check the total grams (g) of carbs in one serving. You can calculate the number of servings of carbs in one serving by dividing the total carbs by 15. For example, if a food has 30 g of total carbs, it would be equal to 2 servings of carbs.  Check the number of grams (g) of saturated and trans fats in one serving. Choose foods that have low or no amount of these fats.  Check the number of   milligrams (mg) of salt (sodium) in one serving. Most people should limit total sodium intake to less than 2,300 mg per day.  Always check the nutrition information of foods labeled as "low-fat" or "nonfat". These foods may be higher in added sugar or refined carbs and should be avoided.  Talk to your dietitian to identify your daily goals for nutrients listed on the label. Shopping  Avoid buying canned, premade, or processed foods. These foods tend to be high in fat, sodium,  and added sugar.  Shop around the outside edge of the grocery store. This includes fresh fruits and vegetables, bulk grains, fresh meats, and fresh dairy. Cooking  Use low-heat cooking methods, such as baking, instead of high-heat cooking methods like deep frying.  Cook using healthy oils, such as olive, canola, or sunflower oil.  Avoid cooking with butter, cream, or high-fat meats. Meal planning  Eat meals and snacks regularly, preferably at the same times every day. Avoid going long periods of time without eating.  Eat foods high in fiber, such as fresh fruits, vegetables, beans, and whole grains. Talk to your dietitian about how many servings of carbs you can eat at each meal.  Eat 4-6 ounces (oz) of lean protein each day, such as lean meat, chicken, fish, eggs, or tofu. One oz of lean protein is equal to: ? 1 oz of meat, chicken, or fish. ? 1 egg. ?  cup of tofu.  Eat some foods each day that contain healthy fats, such as avocado, nuts, seeds, and fish. Lifestyle  Check your blood glucose regularly.  Exercise regularly as told by your health care provider. This may include: ? 150 minutes of moderate-intensity or vigorous-intensity exercise each week. This could be brisk walking, biking, or water aerobics. ? Stretching and doing strength exercises, such as yoga or weightlifting, at least 2 times a week.  Take medicines as told by your health care provider.  Do not use any products that contain nicotine or tobacco, such as cigarettes and e-cigarettes. If you need help quitting, ask your health care provider.  Work with a counselor or diabetes educator to identify strategies to manage stress and any emotional and social challenges. Questions to ask a health care provider  Do I need to meet with a diabetes educator?  Do I need to meet with a dietitian?  What number can I call if I have questions?  When are the best times to check my blood glucose? Where to find more  information:  American Diabetes Association: diabetes.org  Academy of Nutrition and Dietetics: www.eatright.org  National Institute of Diabetes and Digestive and Kidney Diseases (NIH): www.niddk.nih.gov Summary  A healthy meal plan will help you control your blood glucose and maintain a healthy lifestyle.  Working with a diet and nutrition specialist (dietitian) can help you make a meal plan that is best for you.  Keep in mind that carbohydrates (carbs) and alcohol have immediate effects on your blood glucose levels. It is important to count carbs and to use alcohol carefully. This information is not intended to replace advice given to you by your health care provider. Make sure you discuss any questions you have with your health care provider. Document Revised: 11/11/2017 Document Reviewed: 01/03/2017 Elsevier Patient Education  2020 Elsevier Inc. Managing Your Hypertension Hypertension is commonly called high blood pressure. This is when the force of your blood pressing against the walls of your arteries is too strong. Arteries are blood vessels that carry blood from your heart throughout   your body. Hypertension forces the heart to work harder to pump blood, and may cause the arteries to become narrow or stiff. Having untreated or uncontrolled hypertension can cause heart attack, stroke, kidney disease, and other problems. What are blood pressure readings? A blood pressure reading consists of a higher number over a lower number. Ideally, your blood pressure should be below 120/80. The first ("top") number is called the systolic pressure. It is a measure of the pressure in your arteries as your heart beats. The second ("bottom") number is called the diastolic pressure. It is a measure of the pressure in your arteries as the heart relaxes. What does my blood pressure reading mean? Blood pressure is classified into four stages. Based on your blood pressure reading, your health care provider may  use the following stages to determine what type of treatment you need, if any. Systolic pressure and diastolic pressure are measured in a unit called mm Hg. Normal  Systolic pressure: below 120.  Diastolic pressure: below 80. Elevated  Systolic pressure: 120-129.  Diastolic pressure: below 80. Hypertension stage 1  Systolic pressure: 130-139.  Diastolic pressure: 80-89. Hypertension stage 2  Systolic pressure: 140 or above.  Diastolic pressure: 90 or above. What health risks are associated with hypertension? Managing your hypertension is an important responsibility. Uncontrolled hypertension can lead to:  A heart attack.  A stroke.  A weakened blood vessel (aneurysm).  Heart failure.  Kidney damage.  Eye damage.  Metabolic syndrome.  Memory and concentration problems. What changes can I make to manage my hypertension? Hypertension can be managed by making lifestyle changes and possibly by taking medicines. Your health care provider will help you make a plan to bring your blood pressure within a normal range. Eating and drinking   Eat a diet that is high in fiber and potassium, and low in salt (sodium), added sugar, and fat. An example eating plan is called the DASH (Dietary Approaches to Stop Hypertension) diet. To eat this way: ? Eat plenty of fresh fruits and vegetables. Try to fill half of your plate at each meal with fruits and vegetables. ? Eat whole grains, such as whole wheat pasta, brown rice, or whole grain bread. Fill about one quarter of your plate with whole grains. ? Eat low-fat diary products. ? Avoid fatty cuts of meat, processed or cured meats, and poultry with skin. Fill about one quarter of your plate with lean proteins such as fish, chicken without skin, beans, eggs, and tofu. ? Avoid premade and processed foods. These tend to be higher in sodium, added sugar, and fat.  Reduce your daily sodium intake. Most people with hypertension should eat less  than 1,500 mg of sodium a day.  Limit alcohol intake to no more than 1 drink a day for nonpregnant women and 2 drinks a day for men. One drink equals 12 oz of beer, 5 oz of wine, or 1 oz of hard liquor. Lifestyle  Work with your health care provider to maintain a healthy body weight, or to lose weight. Ask what an ideal weight is for you.  Get at least 30 minutes of exercise that causes your heart to beat faster (aerobic exercise) most days of the week. Activities may include walking, swimming, or biking.  Include exercise to strengthen your muscles (resistance exercise), such as weight lifting, as part of your weekly exercise routine. Try to do these types of exercises for 30 minutes at least 3 days a week.  Do not use any   products that contain nicotine or tobacco, such as cigarettes and e-cigarettes. If you need help quitting, ask your health care provider.  Control any long-term (chronic) conditions you have, such as high cholesterol or diabetes. Monitoring  Monitor your blood pressure at home as told by your health care provider. Your personal target blood pressure may vary depending on your medical conditions, your age, and other factors.  Have your blood pressure checked regularly, as often as told by your health care provider. Working with your health care provider  Review all the medicines you take with your health care provider because there may be side effects or interactions.  Talk with your health care provider about your diet, exercise habits, and other lifestyle factors that may be contributing to hypertension.  Visit your health care provider regularly. Your health care provider can help you create and adjust your plan for managing hypertension. Will I need medicine to control my blood pressure? Your health care provider may prescribe medicine if lifestyle changes are not enough to get your blood pressure under control, and if:  Your systolic blood pressure is 130 or  higher.  Your diastolic blood pressure is 80 or higher. Take medicines only as told by your health care provider. Follow the directions carefully. Blood pressure medicines must be taken as prescribed. The medicine does not work as well when you skip doses. Skipping doses also puts you at risk for problems. Contact a health care provider if:  You think you are having a reaction to medicines you have taken.  You have repeated (recurrent) headaches.  You feel dizzy.  You have swelling in your ankles.  You have trouble with your vision. Get help right away if:  You develop a severe headache or confusion.  You have unusual weakness or numbness, or you feel faint.  You have severe pain in your chest or abdomen.  You vomit repeatedly.  You have trouble breathing. Summary  Hypertension is when the force of blood pumping through your arteries is too strong. If this condition is not controlled, it may put you at risk for serious complications.  Your personal target blood pressure may vary depending on your medical conditions, your age, and other factors. For most people, a normal blood pressure is less than 120/80.  Hypertension is managed by lifestyle changes, medicines, or both. Lifestyle changes include weight loss, eating a healthy, low-sodium diet, exercising more, and limiting alcohol. This information is not intended to replace advice given to you by your health care provider. Make sure you discuss any questions you have with your health care provider. Document Revised: 03/23/2019 Document Reviewed: 10/27/2016 Elsevier Patient Education  2020 Elsevier Inc.  
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
## 2020-02-26 ENCOUNTER — Other Ambulatory Visit: Payer: Self-pay

## 2020-02-26 ENCOUNTER — Ambulatory Visit: Payer: Self-pay | Admitting: Endocrinology

## 2020-02-26 DIAGNOSIS — I10 Essential (primary) hypertension: Secondary | ICD-10-CM

## 2020-02-26 DIAGNOSIS — M25511 Pain in right shoulder: Secondary | ICD-10-CM

## 2020-02-26 MED ORDER — PIOGLITAZONE HCL 15 MG PO TABS: 15.0000 mg | ORAL_TABLET | Freq: Every day | ORAL | 3 refills | Status: DC

## 2020-02-26 MED ORDER — GABAPENTIN 400 MG PO CAPS: 400.0000 mg | ORAL_CAPSULE | Freq: Three times a day (TID) | ORAL | 3 refills | Status: DC

## 2020-02-26 MED ORDER — MIRTAZAPINE 15 MG PO TABS: 15.0000 mg | ORAL_TABLET | Freq: Every day | ORAL | 0 refills | Status: DC

## 2020-02-26 MED ORDER — PIOGLITAZONE HCL 15 MG PO TABS: 15.0000 mg | ORAL_TABLET | Freq: Every day | ORAL | 1 refills | Status: DC

## 2020-02-26 MED ORDER — LISINOPRIL 40 MG PO TABS: ORAL_TABLET | ORAL | 3 refills | Status: DC

## 2020-02-26 NOTE — Patient Instructions (Signed)
Start actos 15mg  daily.  He should stop if he has swelling in his legs or shortness of breath.    He needs an appointment with podiatry as soon as possible.    He also needs a new glucometer and strips.
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
## 2020-03-03 ENCOUNTER — Telehealth: Payer: Self-pay | Admitting: Pharmacy Technician

## 2020-03-03 NOTE — Telephone Encounter (Signed)
Received updated proof of income.  Patient eligible to receive medication assistance at [facility] until time for re-certification in 9359, and as long as eligibility requirements continue to be met.  [facility]

## 2020-03-06 ENCOUNTER — Other Ambulatory Visit: Payer: Self-pay

## 2020-03-06 ENCOUNTER — Ambulatory Visit: Payer: Self-pay | Admitting: Licensed Clinical Social Worker

## 2020-03-06 NOTE — BH Specialist Note (Signed)
Note opened in error. HS 

## 2020-03-13 ENCOUNTER — Ambulatory Visit: Payer: Self-pay | Admitting: Licensed Clinical Social Worker

## 2020-03-13 ENCOUNTER — Telehealth: Payer: Self-pay | Admitting: Licensed Clinical Social Worker

## 2020-03-13 NOTE — Telephone Encounter (Signed)
Clinician attempted to contact the patient twice for his scheduled phone visit. Voice mail has not been set up yet.

## 2020-04-10 ENCOUNTER — Other Ambulatory Visit: Payer: Self-pay

## 2020-04-10 ENCOUNTER — Ambulatory Visit: Payer: Self-pay | Admitting: Gerontology

## 2020-04-10 VITALS — BP 146/90 | HR 86 | Ht 66.0 in | Wt 206.4 lb

## 2020-04-10 DIAGNOSIS — G8929 Other chronic pain: Secondary | ICD-10-CM | POA: Insufficient documentation

## 2020-04-10 DIAGNOSIS — I10 Essential (primary) hypertension: Secondary | ICD-10-CM

## 2020-04-10 DIAGNOSIS — M25512 Pain in left shoulder: Secondary | ICD-10-CM | POA: Insufficient documentation

## 2020-04-10 DIAGNOSIS — E119 Type 2 diabetes mellitus without complications: Secondary | ICD-10-CM

## 2020-04-10 MED ORDER — BLOOD GLUCOSE MONITOR KIT: PACK | 0 refills | Status: DC

## 2020-04-10 NOTE — Patient Instructions (Signed)
Carbohydrate Counting for Diabetes Mellitus, Adult  Carbohydrate counting is a method of keeping track of how many carbohydrates you eat. Eating carbohydrates naturally increases the amount of sugar (glucose) in the blood. Counting how many carbohydrates you eat helps keep your blood glucose within normal limits, which helps you manage your diabetes (diabetes mellitus). It is important to know how many carbohydrates you can safely have in each meal. This is different for every person. A diet and nutrition specialist (registered dietitian) can help you make a meal plan and calculate how many carbohydrates you should have at each meal and snack. Carbohydrates are found in the following foods:  Grains, such as breads and cereals.  Dried beans and soy products.  Starchy vegetables, such as potatoes, peas, and corn.  Fruit and fruit juices.  Milk and yogurt.  Sweets and snack foods, such as cake, cookies, candy, chips, and soft drinks. How do I count carbohydrates? There are two ways to count carbohydrates in food. You can use either of the methods or a combination of both. Reading "Nutrition Facts" on packaged food The "Nutrition Facts" list is included on the labels of almost all packaged foods and beverages in the [place] It includes:  The serving size.  Information about nutrients in each serving, including the grams (g) of carbohydrate per serving. To use the "Nutrition Facts":  Decide how many servings you will have.  Multiply the number of servings by the number of carbohydrates per serving.  The resulting number is the total amount of carbohydrates that you will be having. Learning standard serving sizes of other foods When you eat carbohydrate foods that are not packaged or do not include "Nutrition Facts" on the label, you need to measure the servings in order to count the amount of carbohydrates:  Measure the foods that you will eat with a food scale or measuring cup, if needed.   Decide how many standard-size servings you will eat.  Multiply the number of servings by 15. Most carbohydrate-rich foods have about 15 g of carbohydrates per serving. ? For example, if you eat 8 oz (170 g) of strawberries, you will have eaten 2 servings and 30 g of carbohydrates (2 servings x 15 g = 30 g).  For foods that have more than one food mixed, such as soups and casseroles, you must count the carbohydrates in each food that is included. The following list contains standard serving sizes of common carbohydrate-rich foods. Each of these servings has about 15 g of carbohydrates:   hamburger bun or  English muffin.   oz (15 mL) syrup.   oz (14 g) jelly.  1 slice of bread.  1 six-inch tortilla.  3 oz (85 g) cooked rice or pasta.  4 oz (113 g) cooked dried beans.  4 oz (113 g) starchy vegetable, such as peas, corn, or potatoes.  4 oz (113 g) hot cereal.  4 oz (113 g) mashed potatoes or  of a large baked potato.  4 oz (113 g) canned or frozen fruit.  4 oz (120 mL) fruit juice.  4-6 crackers.  6 chicken nuggets.  6 oz (170 g) unsweetened dry cereal.  6 oz (170 g) plain fat-free yogurt or yogurt sweetened with artificial sweeteners.  8 oz (240 mL) milk.  8 oz (170 g) fresh fruit or one small piece of fruit.  24 oz (680 g) popped popcorn. Example of carbohydrate counting Sample meal  3 oz (85 g) chicken breast.  6 oz (170 g)   brown rice.  4 oz (113 g) corn.  8 oz (240 mL) milk.  8 oz (170 g) strawberries with sugar-free whipped topping. Carbohydrate calculation 1. Identify the foods that contain carbohydrates: ? Rice. ? Corn. ? Milk. ? Strawberries. 2. Calculate how many servings you have of each food: ? 2 servings rice. ? 1 serving corn. ? 1 serving milk. ? 1 serving strawberries. 3. Multiply each number of servings by 15 g: ? 2 servings rice x 15 g = 30 g. ? 1 serving corn x 15 g = 15 g. ? 1 serving milk x 15 g = 15 g. ? 1 serving  strawberries x 15 g = 15 g. 4. Add together all of the amounts to find the total grams of carbohydrates eaten: ? 30 g + 15 g + 15 g + 15 g = 75 g of carbohydrates total. Summary  Carbohydrate counting is a method of keeping track of how many carbohydrates you eat.  Eating carbohydrates naturally increases the amount of sugar (glucose) in the blood.  Counting how many carbohydrates you eat helps keep your blood glucose within normal limits, which helps you manage your diabetes.  A diet and nutrition specialist (registered dietitian) can help you make a meal plan and calculate how many carbohydrates you should have at each meal and snack. This information is not intended to replace advice given to you by your health care provider. Make sure you discuss any questions you have with your health care provider. Document Revised: 06/23/2017 Document Reviewed: 05/12/2016 Elsevier Patient Education  2020 [employer].  Managing Your Hypertension Hypertension is commonly called high blood pressure. This is when the force of your blood pressing against the walls of your arteries is too strong. Arteries are blood vessels that carry blood from your heart throughout your body. Hypertension forces the heart to work harder to pump blood, and may cause the arteries to become narrow or stiff. Having untreated or uncontrolled hypertension can cause heart attack, stroke, kidney disease, and other problems. What are blood pressure readings? A blood pressure reading consists of a higher number over a lower number. Ideally, your blood pressure should be below 120/80. The first ("top") number is called the systolic pressure. It is a measure of the pressure in your arteries as your heart beats. The second ("bottom") number is called the diastolic pressure. It is a measure of the pressure in your arteries as the heart relaxes. What does my blood pressure reading mean? Blood pressure is classified into four stages. Based  on your blood pressure reading, your health care provider may use the following stages to determine what type of treatment you need, if any. Systolic pressure and diastolic pressure are measured in a unit called mm Hg. Normal  Systolic pressure: below 120.  Diastolic pressure: below 80. Elevated  Systolic pressure: 120-129.  Diastolic pressure: below 80. Hypertension stage 1  Systolic pressure: 130-139.  Diastolic pressure: 80-89. Hypertension stage 2  Systolic pressure: 140 or above.  Diastolic pressure: 90 or above. What health risks are associated with hypertension? Managing your hypertension is an important responsibility. Uncontrolled hypertension can lead to:  A heart attack.  A stroke.  A weakened blood vessel (aneurysm).  Heart failure.  Kidney damage.  Eye damage.  Metabolic syndrome.  Memory and concentration problems. What changes can I make to manage my hypertension? Hypertension can be managed by making lifestyle changes and possibly by taking medicines. Your health care provider will help you make a plan  to bring your blood pressure within a normal range. Eating and drinking   Eat a diet that is high in fiber and potassium, and low in salt (sodium), added sugar, and fat. An example eating plan is called the DASH (Dietary Approaches to Stop Hypertension) diet. To eat this way: ? Eat plenty of fresh fruits and vegetables. Try to fill half of your plate at each meal with fruits and vegetables. ? Eat whole grains, such as whole wheat pasta, brown rice, or whole grain bread. Fill about one quarter of your plate with whole grains. ? Eat low-fat diary products. ? Avoid fatty cuts of meat, processed or cured meats, and poultry with skin. Fill about one quarter of your plate with lean proteins such as fish, chicken without skin, beans, eggs, and tofu. ? Avoid premade and processed foods. These tend to be higher in sodium, added sugar, and fat.  Reduce your  daily sodium intake. Most people with hypertension should eat less than 1,500 mg of sodium a day.  Limit alcohol intake to no more than 1 drink a day for nonpregnant women and 2 drinks a day for men. One drink equals 12 oz of beer, 5 oz of wine, or 1 oz of hard liquor. Lifestyle  Work with your health care provider to maintain a healthy body weight, or to lose weight. Ask what an ideal weight is for you.  Get at least 30 minutes of exercise that causes your heart to beat faster (aerobic exercise) most days of the week. Activities may include walking, swimming, or biking.  Include exercise to strengthen your muscles (resistance exercise), such as weight lifting, as part of your weekly exercise routine. Try to do these types of exercises for 30 minutes at least 3 days a week.  Do not use any products that contain nicotine or tobacco, such as cigarettes and e-cigarettes. If you need help quitting, ask your health care provider.  Control any long-term (chronic) conditions you have, such as high cholesterol or diabetes. Monitoring  Monitor your blood pressure at home as told by your health care provider. Your personal target blood pressure may vary depending on your medical conditions, your age, and other factors.  Have your blood pressure checked regularly, as often as told by your health care provider. Working with your health care provider  Review all the medicines you take with your health care provider because there may be side effects or interactions.  Talk with your health care provider about your diet, exercise habits, and other lifestyle factors that may be contributing to hypertension.  Visit your health care provider regularly. Your health care provider can help you create and adjust your plan for managing hypertension. Will I need medicine to control my blood pressure? Your health care provider may prescribe medicine if lifestyle changes are not enough to get your blood pressure  under control, and if:  Your systolic blood pressure is 130 or higher.  Your diastolic blood pressure is 80 or higher. Take medicines only as told by your health care provider. Follow the directions carefully. Blood pressure medicines must be taken as prescribed. The medicine does not work as well when you skip doses. Skipping doses also puts you at risk for problems. Contact a health care provider if:  You think you are having a reaction to medicines you have taken.  You have repeated (recurrent) headaches.  You feel dizzy.  You have swelling in your ankles.  You have trouble with your vision. Get help  right away if:  You develop a severe headache or confusion.  You have unusual weakness or numbness, or you feel faint.  You have severe pain in your chest or abdomen.  You vomit repeatedly.  You have trouble breathing. Summary  Hypertension is when the force of blood pumping through your arteries is too strong. If this condition is not controlled, it may put you at risk for serious complications.  Your personal target blood pressure may vary depending on your medical conditions, your age, and other factors. For most people, a normal blood pressure is less than 120/80.  Hypertension is managed by lifestyle changes, medicines, or both. Lifestyle changes include weight loss, eating a healthy, low-sodium diet, exercising more, and limiting alcohol. This information is not intended to replace advice given to you by your health care provider. Make sure you discuss any questions you have with your health care provider. Document Revised: 03/23/2019 Document Reviewed: 10/27/2016 Elsevier Patient Education  [place].
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
## 2020-04-15 ENCOUNTER — Ambulatory Visit: Payer: Self-pay | Admitting: Licensed Clinical Social Worker

## 2020-04-15 ENCOUNTER — Telehealth: Payer: Self-pay | Admitting: Licensed Clinical Social Worker

## 2020-04-15 NOTE — Telephone Encounter (Signed)
Clinician attempted to contact the patient twice during their scheduled phone visit but his voicemail box has not been set up yet.

## 2020-04-16 ENCOUNTER — Other Ambulatory Visit: Payer: Self-pay

## 2020-04-24 ENCOUNTER — Ambulatory Visit: Payer: Self-pay | Admitting: Gerontology

## 2020-04-28 ENCOUNTER — Encounter: Payer: Self-pay | Admitting: Emergency Medicine

## 2020-04-28 ENCOUNTER — Emergency Department
Admission: EM | Admit: 2020-04-28 | Discharge: 2020-04-28 | Disposition: A | Discharge status: Home / Self Care | Payer: Self-pay | Attending: Emergency Medicine | Admitting: Emergency Medicine

## 2020-04-28 ENCOUNTER — Other Ambulatory Visit: Payer: Self-pay

## 2020-04-28 ENCOUNTER — Emergency Department: Payer: Self-pay

## 2020-04-28 DIAGNOSIS — Y999 Unspecified external cause status: Secondary | ICD-10-CM | POA: Insufficient documentation

## 2020-04-28 DIAGNOSIS — S46001A Unspecified injury of muscle(s) and tendon(s) of the rotator cuff of right shoulder, initial encounter: Secondary | ICD-10-CM | POA: Insufficient documentation

## 2020-04-28 DIAGNOSIS — Z79899 Other long term (current) drug therapy: Secondary | ICD-10-CM | POA: Insufficient documentation

## 2020-04-28 DIAGNOSIS — E119 Type 2 diabetes mellitus without complications: Secondary | ICD-10-CM | POA: Insufficient documentation

## 2020-04-28 DIAGNOSIS — W08XXXA Fall from other furniture, initial encounter: Secondary | ICD-10-CM | POA: Insufficient documentation

## 2020-04-28 DIAGNOSIS — M25511 Pain in right shoulder: Secondary | ICD-10-CM

## 2020-04-28 DIAGNOSIS — I10 Essential (primary) hypertension: Secondary | ICD-10-CM | POA: Insufficient documentation

## 2020-04-28 DIAGNOSIS — Z89422 Acquired absence of other left toe(s): Secondary | ICD-10-CM | POA: Insufficient documentation

## 2020-04-28 DIAGNOSIS — Z7984 Long term (current) use of oral hypoglycemic drugs: Secondary | ICD-10-CM | POA: Insufficient documentation

## 2020-04-28 DIAGNOSIS — Y92009 Unspecified place in unspecified non-institutional (private) residence as the place of occurrence of the external cause: Secondary | ICD-10-CM | POA: Insufficient documentation

## 2020-04-28 DIAGNOSIS — F17210 Nicotine dependence, cigarettes, uncomplicated: Secondary | ICD-10-CM | POA: Insufficient documentation

## 2020-04-28 DIAGNOSIS — Y9384 Activity, sleeping: Secondary | ICD-10-CM | POA: Insufficient documentation

## 2020-04-28 MED ORDER — KETOROLAC TROMETHAMINE 30 MG/ML IJ SOLN
30.0000 mg | Freq: Once | INTRAMUSCULAR | Status: AC
  Administered 2020-04-28: 30 mg via INTRAMUSCULAR
  Filled 2020-04-28: qty 1

## 2020-04-28 MED ORDER — MELOXICAM 7.5 MG PO TABS: 7.5000 mg | ORAL_TABLET | Freq: Every day | ORAL | 0 refills | Status: AC

## 2020-04-28 MED ORDER — BACLOFEN 5 MG PO TABS: 5.0000 mg | ORAL_TABLET | Freq: Three times a day (TID) | ORAL | 0 refills | Status: DC | PRN

## 2020-04-28 NOTE — ED Triage Notes (Signed)
Has known right shoulder pain, needs a replacement.  STates fell asleep on the couch with some ice, and then fell off of couch, hitting right shoulder on coffee table.  C/O pain to right shoulder.

## 2020-04-28 NOTE — ED Notes (Signed)
See triage note  Having pain to right shoulder  States he fell from sofa  Landed on shoulder   Hitting the table

## 2020-04-28 NOTE — ED Provider Notes (Signed)
[facility] Emergency Department Provider Note  ____________________________________________  Time seen: Approximately 11:32 AM  I have reviewed the triage vital signs and the nursing notes.   HISTORY  Chief Complaint Shoulder Injury    HPI Justin Landry is a 59 y.o. male that presents to the emergency department for evaluation of acute on chronic right shoulder pain.  Patient fell asleep on the couch and woke up to him rolling off of the couch.  He had his right shoulder on the coffee table.  He did not hit his head or lose consciousness.  Patient states that he was told by orthopedics that he has the shoulder of a "59 year old man." He needs a full shoulder replacement but states that orthopedic would not do his surgery until A1c was less than 8.0.  Patient has changed diet and is taking his diabetes medicines as prescribed and states that his A1c is still not at goal.  He saw primary care last week.  No headache, neck pain, shortness breath, chest pain.   Past Medical History:  Diagnosis Date  . Diabetes mellitus   . High cholesterol   . Humerus fracture    right  . Hypertension   . Inguinal hernia     Patient Active Problem List   Diagnosis Date Noted  . Chronic left shoulder pain 04/10/2020  . Toe pain 02/21/2020  . Anxiety 02/08/2020  . Left knee pain 02/07/2020  . Inguinodynia, right 11/13/2019  . Elevated lipids 10/04/2019  . Diabetic ulcer of toe of left foot associated with diabetes mellitus due to underlying condition ([place]) 08/23/2019  . History of right inguinal hernia 08/14/2019  . Encounter to establish care 08/14/2019  . Anxiety 08/14/2019  . Right shoulder pain 08/14/2019  . Cellulitis 06/22/2019  . Essential hypertension 05/27/2015  . Type 2 diabetes mellitus without complication ([place]) 42/39/5320  . Inguinal hernia, bilateral  04/10/2014    Past Surgical History:  Procedure Laterality Date  . AMPUTATION TOE Left  06/23/2019   Procedure: AMPUTATION TOE LEFT AMPUTATION;  Surgeon: Sharlotte Alamo, DPM;  Location: [facility] ORS;  Service: Podiatry;  Laterality: Left;  . AMPUTATION TOE Left 08/31/2019   Procedure: AMPUTATION TOE  IPJ 23343;  Surgeon: Sharlotte Alamo, DPM;  Location: [facility] ORS;  Service: Podiatry;  Laterality: Left;  . CYSTOSCOPY  2015   Biopsy  . HERNIA REPAIR Right 2008   [place]    Prior to Admission medications   Medication Sig Start Date End Date Taking? Authorizing Provider  amLODipine (NORVASC) 10 MG tablet Take 1 tablet (10 mg total) by mouth daily. 02/21/20   Iloabachie, Chioma E, NP  Baclofen 5 MG TABS Take 5 mg by mouth 3 (three) times daily as needed. 04/28/20   Laban Emperor, PA-C  blood glucose meter kit and supplies KIT Dispense based on patient and insurance preference. Use up to four times daily as directed. (FOR ICD-9 250.00, 250.01). 04/10/20   Iloabachie, Chioma E, NP  Cyanocobalamin (VITAMIN B 12 PO) Take 1 tablet by mouth daily.    [provider]  Fish Oil-Cholecalciferol (FISH OIL + D3) 1000-1000 MG-UNIT CAPS Take 1 capsule by mouth daily with breakfast.     [provider]  gabapentin (NEURONTIN) 400 MG capsule Take 1 capsule (400 mg total) by mouth 3 (three) times daily. 02/26/20   Caraccio, Myrtis Ser, MD  glipiZIDE (GLUCOTROL) 10 MG tablet Take 1 tablet (10 mg total) by mouth 2 (two) times daily before a meal. 02/21/20  Iloabachie, Chioma E, NP  Glucosamine-Chondroit-Vit C-Mn (GLUCOSAMINE 1500 COMPLEX PO) Take 1 tablet by mouth daily.    [provider]  hydrALAZINE (APRESOLINE) 25 MG tablet Take 1 tablet (25 mg total) by mouth 3 (three) times daily. 02/07/20   Iloabachie, Chioma E, NP  lisinopril (ZESTRIL) 40 MG tablet TAKE ONE TABLET (40MG) BY MOUTH EVERY DAY 02/26/20   Caraccio, Myrtis Ser, MD  meloxicam (MOBIC) 7.5 MG tablet Take 1 tablet (7.5 mg total) by mouth daily for 7 days. 04/28/20 05/05/20  Laban Emperor, PA-C  metFORMIN (GLUCOPHAGE) 1000 MG tablet  Take 1 tablet (1,000 mg total) by mouth 2 (two) times daily with a meal. 02/21/20   Iloabachie, Chioma E, NP  mirtazapine (REMERON) 15 MG tablet Take 1 tablet (15 mg total) by mouth at bedtime. 02/26/20 03/27/20  Iloabachie, Chioma E, NP  pioglitazone (ACTOS) 15 MG tablet Take 1 tablet (15 mg total) by mouth daily. 02/26/20   Caraccio, Myrtis Ser, MD  rosuvastatin (CRESTOR) 5 MG tablet Take 1 tablet (5 mg total) by mouth daily at 6 PM. 02/21/20   Iloabachie, Chioma E, NP    Allergies Morphine and related, Oxycontin [oxycodone hcl], Penicillins, and Tramadol  Family History  Problem Relation Age of Onset  . Diabetes Mother   . Hypertension Mother   . Diabetes Father   . Hypertension Father   . Cancer Brother        brain    Social History Social History   Tobacco Use  . Smoking status: Current Every Day Smoker    Packs/day: 1.00    Types: Cigarettes  . Smokeless tobacco: Never Used  Substance Use Topics  . Alcohol use: Yes    Alcohol/week: 2.0 standard drinks    Types: 2 Cans of beer per week    Comment: occasional  . Drug use: Yes    Types: Marijuana     Review of Systems  Respiratory: No SOB. Gastrointestinal: No nausea, no vomiting.  Musculoskeletal: Positive for shoulder pain. Skin: Negative for rash, abrasions, lacerations, ecchymosis. Neurological: Negative for numbness or tingling   ____________________________________________   PHYSICAL EXAM:  VITAL SIGNS: ED Triage Vitals  Enc Vitals Group     BP 04/28/20 0936 (!) 145/86     Pulse Rate 04/28/20 0936 (!) 104     Resp 04/28/20 0936 18     Temp 04/28/20 0936 98.3 F (36.8 C)     Temp Source 04/28/20 0936 Oral     SpO2 04/28/20 0936 100 %     Weight 04/28/20 0937 210 lb (95.3 kg)     Height 04/28/20 0937 5' 7" (1.702 m)     Head Circumference --      Peak Flow --      Pain Score 04/28/20 0940 9     Pain Loc --      Pain Edu? --      Excl. in [place]? --      Constitutional: Alert and oriented. Well  appearing and in no acute distress. Eyes: Conjunctivae are normal. PERRL. EOMI. Head: Atraumatic. ENT:      Ears:      Nose: No congestion/rhinnorhea.      Mouth/Throat: Mucous membranes are moist.  Neck: No stridor. Cardiovascular: Normal rate, regular rhythm.  Good peripheral circulation.  Symmetric radial pulses bilaterally. Respiratory: Normal respiratory effort without tachypnea or retractions. Lungs CTAB. Good air entry to the bases with no decreased or absent breath sounds. Musculoskeletal: Full range of motion to all extremities.  No gross deformities appreciated.  Unable to actively abduct right shoulder past 90 degrees.  Pain elicited with range of motion of right shoulder.  Full range of motion of right shoulder with passive movement. Neurologic:  Normal speech and language. No gross focal neurologic deficits are appreciated.  Skin:  Skin is warm, dry and intact. No rash noted. Psychiatric: Mood and affect are normal. Speech and behavior are normal. Patient exhibits appropriate insight and judgement.   ____________________________________________   LABS (all labs ordered are listed, but only abnormal results are displayed)  Labs Reviewed - No data to display ____________________________________________  EKG   ____________________________________________  RADIOLOGY Robinette Haines, personally viewed and evaluated these images (plain radiographs) as part of my medical decision making, as well as reviewing the written report by the radiologist.  DG Shoulder Right  Result Date: 04/28/2020 CLINICAL DATA:  Right shoulder pain EXAM: RIGHT SHOULDER - 2+ VIEW COMPARISON:  08/11/2019 FINDINGS: Similar moderate to severe right shoulder degenerative arthropathy as well as AC joint degenerative change with joint space loss, sclerosis and bony spurring. Chronic high-riding right humeral head, suspect chronic rotator cuff tear. No acute osseous finding or malalignment. No fracture  evident. Included right chest unremarkable. Degenerative changes of the thoracic spine. IMPRESSION: Chronic right shoulder degenerative arthropathy as well as AC joint degenerative change. Suspect chronic right rotator cuff tear with a high-riding right humeral head. Electronically Signed   By: Jerilynn Mages.  Shick M.D.   On: 04/28/2020 10:04    ____________________________________________    PROCEDURES  Procedure(s) performed:    Procedures    Medications  ketorolac (TORADOL) 30 MG/ML injection 30 mg (30 mg Intramuscular Given 04/28/20 1210)     ____________________________________________   INITIAL IMPRESSION / ASSESSMENT AND PLAN / ED COURSE  Pertinent labs & imaging results that were available during my care of the patient were reviewed by me and considered in my medical decision making (see chart for details).  Review of the [place] CSRS was performed in accordance of the [place] prior to dispensing any controlled drugs.   Patient presented to emergency department for evaluation of acute on chronic right shoulder pain.  Vital signs and exam are reassuring.  X-ray consistent with chronic changes.  Long discussion was had with patient about his diabetes.  IM Toradol was given for pain.  Shoulder sling was given.  Patient will be discharged home with prescriptions for baclofen and mobic. Patient is to follow up with orthopedics and primary care as directed. Patient is given ED precautions to return to the ED for any worsening or new symptoms.   Josh Nicolosi was evaluated in Emergency Department on 04/28/2020 for the symptoms described in the history of present illness. He was evaluated in the context of the global COVID-19 pandemic, which necessitated consideration that the patient might be at risk for infection with the SARS-CoV-2 virus that causes COVID-19. Institutional protocols and algorithms that pertain to the evaluation of patients at risk for COVID-19 are in a state of rapid change  based on information released by regulatory bodies including the CDC and federal and state organizations. These policies and algorithms were followed during the patient's care in the ED.  ____________________________________________  FINAL CLINICAL IMPRESSION(S) / ED DIAGNOSES  Final diagnoses:  Injury of right rotator cuff, initial encounter  Acute pain of right shoulder      NEW MEDICATIONS STARTED DURING THIS VISIT:  ED Discharge Orders         Ordered    Baclofen  5 MG TABS  3 times daily PRN     04/28/20 1252    meloxicam (MOBIC) 7.5 MG tablet  Daily     04/28/20 1252              This chart was dictated using voice recognition software/Dragon. Despite best efforts to proofread, errors can occur which can change the meaning. Any change was purely unintentional.    Laban Emperor, PA-C 04/28/20 1402    Arta Silence, MD 04/28/20 [phone]

## 2020-04-29 ENCOUNTER — Ambulatory Visit: Payer: Self-pay

## 2020-04-30 ENCOUNTER — Other Ambulatory Visit: Payer: Self-pay

## 2020-04-30 DIAGNOSIS — E785 Hyperlipidemia, unspecified: Secondary | ICD-10-CM

## 2020-04-30 DIAGNOSIS — E119 Type 2 diabetes mellitus without complications: Secondary | ICD-10-CM

## 2020-05-01 ENCOUNTER — Ambulatory Visit: Payer: Self-pay | Admitting: Gerontology

## 2020-05-01 LAB — LIPID PANEL
Chol/HDL Ratio: 5.4 ratio — ABNORMAL HIGH (ref 0.0–5.0)
Cholesterol, Total: 173 mg/dL (ref 100–199)
HDL: 32 mg/dL — ABNORMAL LOW (ref >39)
LDL Chol Calc (NIH): 86 mg/dL (ref 0–99)
Triglycerides: 332 mg/dL — ABNORMAL HIGH (ref 0–149)
VLDL Cholesterol Cal: 55 mg/dL — ABNORMAL HIGH (ref 5–40)

## 2020-05-01 LAB — HEMOGLOBIN A1C
Est. average glucose Bld gHb Est-mCnc: 197 mg/dL
Hgb A1c MFr Bld: 8.5 % — ABNORMAL HIGH (ref 4.8–5.6)

## 2020-05-07 ENCOUNTER — Ambulatory Visit: Payer: Self-pay | Admitting: Gerontology

## 2020-05-08 ENCOUNTER — Other Ambulatory Visit: Payer: Self-pay

## 2020-05-13 ENCOUNTER — Ambulatory Visit: Payer: Self-pay

## 2020-05-16 ENCOUNTER — Other Ambulatory Visit: Payer: Self-pay

## 2020-05-16 ENCOUNTER — Emergency Department
Admission: EM | Admit: 2020-05-16 | Discharge: 2020-05-16 | Disposition: A | Discharge status: Home / Self Care | Payer: Self-pay | Attending: Emergency Medicine | Admitting: Emergency Medicine

## 2020-05-16 DIAGNOSIS — Z89422 Acquired absence of other left toe(s): Secondary | ICD-10-CM | POA: Insufficient documentation

## 2020-05-16 DIAGNOSIS — Z7984 Long term (current) use of oral hypoglycemic drugs: Secondary | ICD-10-CM | POA: Insufficient documentation

## 2020-05-16 DIAGNOSIS — K029 Dental caries, unspecified: Secondary | ICD-10-CM | POA: Insufficient documentation

## 2020-05-16 DIAGNOSIS — F17220 Nicotine dependence, chewing tobacco, uncomplicated: Secondary | ICD-10-CM | POA: Insufficient documentation

## 2020-05-16 DIAGNOSIS — F17210 Nicotine dependence, cigarettes, uncomplicated: Secondary | ICD-10-CM | POA: Insufficient documentation

## 2020-05-16 DIAGNOSIS — K0381 Cracked tooth: Secondary | ICD-10-CM | POA: Insufficient documentation

## 2020-05-16 DIAGNOSIS — I10 Essential (primary) hypertension: Secondary | ICD-10-CM | POA: Insufficient documentation

## 2020-05-16 DIAGNOSIS — K047 Periapical abscess without sinus: Secondary | ICD-10-CM | POA: Insufficient documentation

## 2020-05-16 DIAGNOSIS — Z79899 Other long term (current) drug therapy: Secondary | ICD-10-CM | POA: Insufficient documentation

## 2020-05-16 DIAGNOSIS — E119 Type 2 diabetes mellitus without complications: Secondary | ICD-10-CM | POA: Insufficient documentation

## 2020-05-16 DIAGNOSIS — K140 Glossitis: Secondary | ICD-10-CM | POA: Insufficient documentation

## 2020-05-16 MED ORDER — MELOXICAM 7.5 MG PO TABS: 7.5000 mg | ORAL_TABLET | Freq: Every day | ORAL | 0 refills | Status: AC

## 2020-05-16 MED ORDER — CLINDAMYCIN HCL 300 MG PO CAPS: 300.0000 mg | ORAL_CAPSULE | Freq: Three times a day (TID) | ORAL | 0 refills | Status: AC

## 2020-05-16 MED ORDER — LIDOCAINE VISCOUS HCL 2 % MT SOLN: 10.0000 mL | OROMUCOSAL | 0 refills | Status: DC | PRN

## 2020-05-16 MED ORDER — CLINDAMYCIN PHOSPHATE 900 MG/6ML IJ SOLN: 900.0000 mg | Freq: Once | INTRAMUSCULAR | Status: DC

## 2020-05-16 MED ORDER — LIDOCAINE VISCOUS HCL 2 % MT SOLN
15.0000 mL | Freq: Once | OROMUCOSAL | Status: AC
  Administered 2020-05-16: 15 mL via OROMUCOSAL
  Filled 2020-05-16: qty 15

## 2020-05-16 MED ORDER — CLINDAMYCIN PHOSPHATE 600 MG/4ML IJ SOLN
600.0000 mg | Freq: Once | INTRAMUSCULAR | Status: AC
  Administered 2020-05-16: 600 mg via INTRAMUSCULAR
  Filled 2020-05-16: qty 4

## 2020-05-16 MED ORDER — CLINDAMYCIN PHOSPHATE 600 MG/4ML IJ SOLN
600.0000 mg | Freq: Once | INTRAMUSCULAR | Status: DC
  Filled 2020-05-16 (×2): qty 4

## 2020-05-16 MED ORDER — KETOROLAC TROMETHAMINE 10 MG PO TABS: 10.0000 mg | ORAL_TABLET | Freq: Four times a day (QID) | ORAL | 0 refills | Status: DC | PRN

## 2020-05-16 MED ORDER — KETOROLAC TROMETHAMINE 30 MG/ML IJ SOLN
30.0000 mg | Freq: Once | INTRAMUSCULAR | Status: AC
  Administered 2020-05-16: 30 mg via INTRAMUSCULAR
  Filled 2020-05-16: qty 1

## 2020-05-16 NOTE — ED Triage Notes (Signed)
Pt c/o right lower tooth ache for the past 3 days, states it broke off.

## 2020-05-16 NOTE — ED Provider Notes (Signed)
[facility] Emergency Department Provider Note  ____________________________________________  Time seen: Approximately 11:05 AM  I have reviewed the triage vital signs and the nursing notes.   HISTORY  Chief Complaint Dental Pain    HPI Justin Landry is a 59 y.o. male that presents to emergency department for evaluation of tongue pain for several days.  Patient states that his tooth broke several weeks ago.  He may have cut his tongue on his tooth.  He has a painful spot on his tongue and some swelling surrounding his tooth.  He does not have a dentist, as he does not have insurance.  He does use chewing tobacco.  No fevers.   Past Medical History:  Diagnosis Date  . Diabetes mellitus   . High cholesterol   . Humerus fracture    right  . Hypertension   . Inguinal hernia     Patient Active Problem List   Diagnosis Date Noted  . Chronic left shoulder pain 04/10/2020  . Toe pain 02/21/2020  . Anxiety 02/08/2020  . Left knee pain 02/07/2020  . Inguinodynia, right 11/13/2019  . Elevated lipids 10/04/2019  . Diabetic ulcer of toe of left foot associated with diabetes mellitus due to underlying condition ([place]) 08/23/2019  . History of right inguinal hernia 08/14/2019  . Encounter to establish care 08/14/2019  . Anxiety 08/14/2019  . Right shoulder pain 08/14/2019  . Cellulitis 06/22/2019  . Essential hypertension 05/27/2015  . Type 2 diabetes mellitus without complication ([place]) 83/66/2947  . Inguinal hernia, bilateral  04/10/2014    Past Surgical History:  Procedure Laterality Date  . AMPUTATION TOE Left 06/23/2019   Procedure: AMPUTATION TOE LEFT AMPUTATION;  Surgeon: Sharlotte Alamo, DPM;  Location: [facility] ORS;  Service: Podiatry;  Laterality: Left;  . AMPUTATION TOE Left 08/31/2019   Procedure: AMPUTATION TOE  IPJ [id number];  Surgeon: Sharlotte Alamo, DPM;  Location: [facility] ORS;  Service: Podiatry;  Laterality: Left;  . CYSTOSCOPY  2015   Biopsy  .  HERNIA REPAIR Right 2008   [place]    Prior to Admission medications   Medication Sig Start Date End Date Taking? Authorizing Provider  amLODipine (NORVASC) 10 MG tablet Take 1 tablet (10 mg total) by mouth daily. 02/21/20   Iloabachie, Chioma E, NP  Baclofen 5 MG TABS Take 5 mg by mouth 3 (three) times daily as needed. 04/28/20   Laban Emperor, PA-C  blood glucose meter kit and supplies KIT Dispense based on patient and insurance preference. Use up to four times daily as directed. (FOR ICD-9 250.00, 250.01). 04/10/20   Iloabachie, Chioma E, NP  clindamycin (CLEOCIN) 300 MG capsule Take 1 capsule (300 mg total) by mouth 3 (three) times daily for 10 days. 05/16/20 05/26/20  Laban Emperor, PA-C  Cyanocobalamin (VITAMIN B 12 PO) Take 1 tablet by mouth daily.    [provider]  Fish Oil-Cholecalciferol (FISH OIL + D3) 1000-1000 MG-UNIT CAPS Take 1 capsule by mouth daily with breakfast.     [provider]  gabapentin (NEURONTIN) 400 MG capsule Take 1 capsule (400 mg total) by mouth 3 (three) times daily. 02/26/20   Caraccio, Myrtis Ser, MD  glipiZIDE (GLUCOTROL) 10 MG tablet Take 1 tablet (10 mg total) by mouth 2 (two) times daily before a meal. 02/21/20   Iloabachie, Chioma E, NP  Glucosamine-Chondroit-Vit C-Mn (GLUCOSAMINE 1500 COMPLEX PO) Take 1 tablet by mouth daily.    [provider]  hydrALAZINE (APRESOLINE) 25 MG tablet Take 1 tablet (  25 mg total) by mouth 3 (three) times daily. 02/07/20   Iloabachie, Chioma E, NP  ketorolac (TORADOL) 10 MG tablet Take 1 tablet (10 mg total) by mouth every 6 (six) hours as needed. 05/16/20   Laban Emperor, PA-C  lidocaine (XYLOCAINE) 2 % solution Use as directed 10 mLs in the mouth or throat as needed. 05/16/20   Laban Emperor, PA-C  lisinopril (ZESTRIL) 40 MG tablet TAKE ONE TABLET (40MG) BY MOUTH EVERY DAY 02/26/20   Caraccio, Myrtis Ser, MD  metFORMIN (GLUCOPHAGE) 1000 MG tablet Take 1 tablet (1,000 mg total) by mouth 2 (two) times daily  with a meal. 02/21/20   Iloabachie, Chioma E, NP  mirtazapine (REMERON) 15 MG tablet Take 1 tablet (15 mg total) by mouth at bedtime. 02/26/20 03/27/20  Iloabachie, Chioma E, NP  pioglitazone (ACTOS) 15 MG tablet Take 1 tablet (15 mg total) by mouth daily. 02/26/20   Caraccio, Myrtis Ser, MD  rosuvastatin (CRESTOR) 5 MG tablet Take 1 tablet (5 mg total) by mouth daily at 6 PM. 02/21/20   Iloabachie, Chioma E, NP    Allergies Morphine and related, Oxycontin [oxycodone hcl], Penicillins, and Tramadol  Family History  Problem Relation Age of Onset  . Diabetes Mother   . Hypertension Mother   . Diabetes Father   . Hypertension Father   . Cancer Brother        brain    Social History Social History   Tobacco Use  . Smoking status: Current Every Day Smoker    Packs/day: 1.00    Types: Cigarettes  . Smokeless tobacco: Never Used  Substance Use Topics  . Alcohol use: Yes    Alcohol/week: 2.0 standard drinks    Types: 2 Cans of beer per week    Comment: occasional  . Drug use: Yes    Types: Marijuana     Review of Systems  Constitutional: No fever/chills ENT: No upper respiratory complaints. Cardiovascular: No chest pain. Respiratory: No SOB. Gastrointestinal: No nausea, no vomiting.  Musculoskeletal: Negative for musculoskeletal pain. Skin: Negative for rash, abrasions, lacerations, ecchymosis. Neurological: Negative for headaches   ____________________________________________   PHYSICAL EXAM:  VITAL SIGNS: ED Triage Vitals  Enc Vitals Group     BP 05/16/20 0931 (!) 158/83     Pulse Rate 05/16/20 0931 87     Resp 05/16/20 0931 17     Temp 05/16/20 0931 98.2 F (36.8 C)     Temp Source 05/16/20 0931 Oral     SpO2 05/16/20 0931 99 %     Weight 05/16/20 0929 210 lb (95.3 kg)     Height 05/16/20 0929 5' 7" (1.702 m)     Head Circumference --      Peak Flow --      Pain Score 05/16/20 0929 9     Pain Loc --      Pain Edu? --      Excl. in [place]? --       Constitutional: Alert and oriented. Well appearing and in no acute distress. Eyes: Conjunctivae are normal. PERRL. EOMI. Head: Atraumatic. ENT:      Ears:      Nose: No congestion/rhinnorhea.      Mouth/Throat: Mucous membranes are moist.  Poor dentition with multiple cavities and broken teeth.  Large fracture to right bottom posterior molar with surrounding tenderness.  No palpable abscess.  1/2 cm x 1/2 cm ulcer to right side of tongue.  Full range of motion of jaw. Neck: No stridor.  Cardiovascular: Normal rate, regular rhythm.  Good peripheral circulation. Respiratory: Normal respiratory effort without tachypnea or retractions. Lungs CTAB. Good air entry to the bases with no decreased or absent breath sounds. Musculoskeletal: Full range of motion to all extremities. No gross deformities appreciated. Neurologic:  Normal speech and language. No gross focal neurologic deficits are appreciated.  Skin:  Skin is warm, dry and intact. No rash noted. Psychiatric: Mood and affect are normal. Speech and behavior are normal. Patient exhibits appropriate insight and judgement.   ____________________________________________   LABS (all labs ordered are listed, but only abnormal results are displayed)  Labs Reviewed - No data to display ____________________________________________  EKG   ____________________________________________  RADIOLOGY   No results found.  ____________________________________________    PROCEDURES  Procedure(s) performed:    Procedures    Medications  lidocaine (XYLOCAINE) 2 % viscous mouth solution 15 mL (has no administration in time range)  ketorolac (TORADOL) 30 MG/ML injection 30 mg (has no administration in time range)  clindamycin (CLEOCIN) injection 600 mg (has no administration in time range)     ____________________________________________   INITIAL IMPRESSION / ASSESSMENT AND PLAN / ED COURSE  Pertinent labs & imaging results  that were available during my care of the patient were reviewed by me and considered in my medical decision making (see chart for details).  Review of the [place] CSRS was performed in accordance of the [place] prior to dispensing any controlled drugs.   Patient's diagnosis is consistent with dental infection and tongue ulcer.  Vital signs and exam are reassuring. I do not palpate an abscess.  Patient was given IM Toradol, IM clindamycin and oral viscous lidocaine for symptoms.  Patient is allergic to penicillin.  Patient will be discharged home with prescriptions for clindamycin, viscous lidocaine, Toradol. Patient is to follow up with dentist as directed.  Dental resources were given.  Patient is given ED precautions to return to the ED for any worsening or new symptoms.   Justin Landry was evaluated in Emergency Department on 05/16/2020 for the symptoms described in the history of present illness. He was evaluated in the context of the global COVID-19 pandemic, which necessitated consideration that the patient might be at risk for infection with the SARS-CoV-2 virus that causes COVID-19. Institutional protocols and algorithms that pertain to the evaluation of patients at risk for COVID-19 are in a state of rapid change based on information released by regulatory bodies including the CDC and federal and state organizations. These policies and algorithms were followed during the patient's care in the ED.  ____________________________________________  FINAL CLINICAL IMPRESSION(S) / ED DIAGNOSES  Final diagnoses:  Tongue ulcer      NEW MEDICATIONS STARTED DURING THIS VISIT:  ED Discharge Orders         Ordered    clindamycin (CLEOCIN) 300 MG capsule  3 times daily     05/16/20 1110    lidocaine (XYLOCAINE) 2 % solution  As needed     05/16/20 1110    ketorolac (TORADOL) 10 MG tablet  Every 6 hours PRN     05/16/20 1110              This chart was dictated using voice recognition  software/Dragon. Despite best efforts to proofread, errors can occur which can change the meaning. Any change was purely unintentional.    Laban Emperor, PA-C 05/16/20 1350    Arta Silence, MD 05/16/20 1615

## 2020-05-16 NOTE — ED Notes (Signed)
Pt from home with pain on his tongue. Pt states he broke his tooth off a few weeks ago, and he may have cut the tongue on the tooth. Pt states he is having difficulty swallowing saliva due to the pain and swelling and is unable to eat. Pt alert & oriented.

## 2020-05-16 NOTE — Discharge Instructions (Signed)
OPTIONS FOR DENTAL FOLLOW UP CARE ° °[place] Department of Health and Human Services - Local Safety Net Dental Clinics °http://www.ncdhhs.gov/dph/oralhealth/services/safetynetclinics.htm °  °[facility] (336-562-3123) ° °[facility] [place] ([phone]) ° °[facility] ([phone] ext [phone]) ° °[facility] ([phone]) ° °[facility] ([phone]) °This clinic caters to the indigent population and is on a lottery system. °Location: °[facility], [address], [place] °Clinic Hours: °Wednesdays from 6pm - 9pm, patients seen by a lottery system. °For dates, call or go to www.med.[facility].edu/[facility]/patients/Dental-[facility] °Services: °Cleanings, fillings and simple extractions. °Payment Options: °DENTAL WORK IS FREE OF CHARGE. Bring proof of income or support. °Best way to get seen: °Arrive at 5:15 pm - this is a lottery, NOT first come/first serve, so arriving earlier will not increase your chances of being seen. °  °  °[facility] °[phone] °Select option 1 for emergencies °  °Location: °[facility], [address], [place] °Clinic Hours: °No walk-ins accepted - call the day before to schedule an appointment. °Check in times are 9:30 am and 1:30 pm. °Services: °Simple extractions, temporary fillings, pulpectomy/pulp debridement, uncomplicated abscess drainage. °Payment Options: °PAYMENT IS DUE AT THE TIME OF SERVICE.  Fee is usually $100-200, additional surgical procedures (e.g. abscess drainage) may be extra. °Cash, checks, Visa/MasterCard accepted.  Can file Medicaid if patient is covered for dental - patient should call case worker to check. °No discount for [facility] patients. °Best way to get seen: °MUST call the day before and get onto the schedule. Can usually be seen the next 1-2 days. No walk-ins accepted. °  °  °[facility] °[phone] °   °Location: °[facility], [address], [place] °Clinic Hours: °M, W, Th, F 8am or 1:30pm, Tues 9a or 1:30 - first come/first served. °Services: °Simple extractions, temporary fillings, uncomplicated abscess drainage.  You do not need to be an [place] resident. °Payment Options: °PAYMENT IS DUE AT THE TIME OF SERVICE. °Dental insurance, otherwise sliding scale - bring proof of income or support. °Depending on income and treatment needed, cost is usually $50-200. °Best way to get seen: °Arrive early as it is first come/first served. °  °  °[facility] °[phone] °  °Location: °[address] °Clinic Hours: °Mon-Thu 8a-5p °Services: °Most basic dental services including extractions and fillings. °Payment Options: °PAYMENT IS DUE AT THE TIME OF SERVICE. °Sliding scale, up to 50% off - bring proof if income or support. °Medicaid with dental option accepted. °Best way to get seen: °Call to schedule an appointment, can usually be seen within 2 weeks OR they will try to see walk-ins - show up at 8a or 2p (you may have to wait). °  °  °[facility] °[phone] °[place] RESIDENTS ONLY °  °Location: °[facility], [address], [place], [place] [address] °Clinic Hours: By appointment only. °Monday - Thursday 8am-5pm, Friday 8am-12pm °Services: Cleanings, fillings, extractions. °Payment Options: °PAYMENT IS DUE AT THE TIME OF SERVICE. °Cash, Visa or MasterCard. Sliding scale - $30 minimum per service. °Best way to get seen: °Come in to office, complete packet and make an appointment - need proof of income °or support monies for each household member and proof of [place] residence. °Usually takes about a month to get in. °  °  °[facility] °[phone] °  °Location: °[address]   [place] °Clinic Hours: Walk-in Urgent Care Dental Services are offered Monday-Friday  mornings only. °The numbers of emergencies accepted daily is limited to the number of °providers available. °Maximum 15 - Mondays, Wednesdays & Thursdays °Maximum 10 - Tuesdays & Fridays °Services: °You do not need to be a [place] resident to be seen for a dental emergency. °Emergencies are defined as pain, swelling, abnormal bleeding, or dental trauma. Walkins will receive x-rays if needed. °NOTE: Dental cleaning is not an emergency. °Payment Options: °PAYMENT IS DUE AT THE TIME OF SERVICE. °Minimum co-pay is $40.00 for uninsured patients. °Minimum co-pay is $3.00 for Medicaid with dental coverage. °Dental Insurance is accepted and must be presented at time of visit. °Medicare does not cover dental. °Forms of payment: Cash, credit card, checks. °Best way to get seen: °If not previously registered with the clinic, walk-in dental registration begins at 7:15 am and is on a first come/first serve basis. °If previously registered with the clinic, call to make an appointment. °  °  °[facility] °[phone] °[place] RESIDENTS ONLY °  °Location: °[address], [place], [place] °Clinic Hours: °Mon-Thu 10a-2p °Services: Extractions only! °Payment Options: °FREE (donations accepted) - bring proof of income or support °Best way to get seen: °Call and schedule an appointment OR come at 8am on the 1st Monday of every month (except for holidays) when it is first come/first served. °  °  °[facility] °[phone] °  °Location: °[address], [place] °Clinic Hours: °Friday mornings °Services, Payment Options, Best way to get seen: °Call for info °

## 2020-05-16 NOTE — ED Notes (Signed)
Called pharmacy to request clindamycin. They said they will send.

## 2020-05-27 ENCOUNTER — Ambulatory Visit: Payer: Self-pay | Admitting: Gerontology

## 2020-05-27 ENCOUNTER — Other Ambulatory Visit: Payer: Self-pay

## 2020-05-27 MED ORDER — GABAPENTIN 400 MG PO CAPS: 400.0000 mg | ORAL_CAPSULE | Freq: Three times a day (TID) | ORAL | 0 refills | Status: DC

## 2020-05-27 MED ORDER — GABAPENTIN 100 MG PO CAPS: 100.0000 mg | ORAL_CAPSULE | Freq: Three times a day (TID) | ORAL | 0 refills | Status: DC

## 2020-06-11 ENCOUNTER — Other Ambulatory Visit: Payer: Self-pay | Admitting: Gerontology

## 2020-06-11 DIAGNOSIS — I10 Essential (primary) hypertension: Secondary | ICD-10-CM

## 2020-06-25 ENCOUNTER — Other Ambulatory Visit: Payer: Self-pay

## 2020-06-25 VITALS — BP 146/92 | HR 91 | Ht 67.0 in | Wt 206.3 lb

## 2020-06-25 DIAGNOSIS — I10 Essential (primary) hypertension: Secondary | ICD-10-CM

## 2020-06-25 MED ORDER — GABAPENTIN 400 MG PO CAPS: 400.0000 mg | ORAL_CAPSULE | Freq: Three times a day (TID) | ORAL | 0 refills | Status: DC

## 2020-06-26 LAB — CBC WITH DIFFERENTIAL/PLATELET
Basophils Absolute: 0.1 10*3/uL (ref 0.0–0.2)
Basos: 1 %
EOS (ABSOLUTE): 0.3 10*3/uL (ref 0.0–0.4)
Eos: 2 %
Hematocrit: 48 % (ref 37.5–51.0)
Hemoglobin: 16.3 g/dL (ref 13.0–17.7)
Immature Grans (Abs): 0 10*3/uL (ref 0.0–0.1)
Immature Granulocytes: 0 %
Lymphocytes Absolute: 2.3 10*3/uL (ref 0.7–3.1)
Lymphs: 20 %
MCH: 31.3 pg (ref 26.6–33.0)
MCHC: 34 g/dL (ref 31.5–35.7)
MCV: 92 fL (ref 79–97)
Monocytes Absolute: 0.6 10*3/uL (ref 0.1–0.9)
Monocytes: 5 %
Neutrophils Absolute: 8.3 10*3/uL — ABNORMAL HIGH (ref 1.4–7.0)
Neutrophils: 72 %
Platelets: 287 10*3/uL (ref 150–450)
RBC: 5.2 x10E6/uL (ref 4.14–5.80)
RDW: 12.4 % (ref 11.6–15.4)
WBC: 11.7 10*3/uL — ABNORMAL HIGH (ref 3.4–10.8)

## 2020-06-26 LAB — LIPID PANEL
Chol/HDL Ratio: 4.3 ratio (ref 0.0–5.0)
Cholesterol, Total: 151 mg/dL (ref 100–199)
HDL: 35 mg/dL — ABNORMAL LOW (ref >39)
LDL Chol Calc (NIH): 65 mg/dL (ref 0–99)
Triglycerides: 322 mg/dL — ABNORMAL HIGH (ref 0–149)
VLDL Cholesterol Cal: 51 mg/dL — ABNORMAL HIGH (ref 5–40)

## 2020-06-26 LAB — COMPREHENSIVE METABOLIC PANEL
ALT: 52 IU/L — ABNORMAL HIGH (ref 0–44)
AST: 43 IU/L — ABNORMAL HIGH (ref 0–40)
Albumin/Globulin Ratio: 1.7 (ref 1.2–2.2)
Albumin: 4.5 g/dL (ref 3.8–4.9)
Alkaline Phosphatase: 61 IU/L (ref 48–121)
BUN/Creatinine Ratio: 17 (ref 9–20)
BUN: 14 mg/dL (ref 6–24)
Bilirubin Total: 0.4 mg/dL (ref 0.0–1.2)
CO2: 22 mmol/L (ref 20–29)
Calcium: 9.9 mg/dL (ref 8.7–10.2)
Chloride: 99 mmol/L (ref 96–106)
Creatinine, Ser: 0.81 mg/dL (ref 0.76–1.27)
GFR calc Af Amer: 114 mL/min/{1.73_m2} (ref >59)
GFR calc non Af Amer: 99 mL/min/{1.73_m2} (ref >59)
Globulin, Total: 2.6 g/dL (ref 1.5–4.5)
Glucose: 286 mg/dL — ABNORMAL HIGH (ref 65–99)
Potassium: 4.3 mmol/L (ref 3.5–5.2)
Sodium: 135 mmol/L (ref 134–144)
Total Protein: 7.1 g/dL (ref 6.0–8.5)

## 2020-06-26 LAB — HEMOGLOBIN A1C
Est. average glucose Bld gHb Est-mCnc: 171 mg/dL
Hgb A1c MFr Bld: 7.6 % — ABNORMAL HIGH (ref 4.8–5.6)

## 2020-07-01 ENCOUNTER — Ambulatory Visit: Payer: Self-pay

## 2020-07-01 ENCOUNTER — Other Ambulatory Visit: Payer: Self-pay

## 2020-07-01 VITALS — BP 160/95 | HR 94 | Temp 98.7°F | Ht 67.0 in | Wt 210.4 lb

## 2020-07-01 DIAGNOSIS — L03119 Cellulitis of unspecified part of limb: Secondary | ICD-10-CM

## 2020-07-01 DIAGNOSIS — E119 Type 2 diabetes mellitus without complications: Secondary | ICD-10-CM

## 2020-07-01 DIAGNOSIS — E11628 Type 2 diabetes mellitus with other skin complications: Secondary | ICD-10-CM

## 2020-07-01 MED ORDER — CLINDAMYCIN HCL 300 MG PO CAPS: 300.0000 mg | ORAL_CAPSULE | Freq: Three times a day (TID) | ORAL | 0 refills | Status: DC

## 2020-07-01 NOTE — Progress Notes (Unsigned)
Follow up Diabetes/ [facility]     Patient ID: Justin Landry, male   DOB: 1961-04-19, 59 y.o.   MRN: [id number] Assessment:  Justin Landry is a 59 y.o. male who is seen in follow up for diabetes and foot pain at the request of Iloabachie, Chioma E, NP.  Encounter Diagnoses No diagnosis found.   Assessment/Plan:   1. Diabetes Mellitus  Pt has had type 2 diabetes since he was 59 yo. He is well managed on metformin 100mg  and glipizide 10mg  daily and is tolerating medications well. He is unsure if he is taking the piglitazone 15mg . He is at goal with HbA1C of <8% (HbA1C on 06/25/20 of 7.6).  -Monofilament exam completed and pt has little to no sensation in feet bilaterally.  -Counseled pt to ensure he fills all medications and begins to take piglitazone.  -Continue metformin 100mg  daily -Continue glipizide 10mg  daily  -A1C is due October 2021 - Referred to [facility] ambulatory [place] for DR screening -In need of referral to [facility] due to bilateral claudication  -UACR due Feb 2022  2. Cellulitis secondary to DM Pt reports burning painful sensation on inside of right big toe. Upon examination, the lesion is warm and erythematous.  -Pt counseled to conduct regular foot exams due to lack of sensation bilaterally.  -Pt counseled to go to the ED if infection begins getting worse.  -Begin clindamycin 300mg  TIDAC for infection. -F/U w/ PCP on 07/03/20     There are no Patient Instructions on file for this visit.   No orders of the defined types were placed in this encounter.    Subjective:  Justin Landry is a 59 yo male with a history of diabetes, HTN & HLD. Pt reports taking medications (glipizide 10mg , metformin 1000mg ) regularly with no problems besides urgent diarrhea due to metformin starting in June/July, but reports it has improved slightly. Pt is unsure if they are taking the actos. Toenail on third left toe came off, toenail on second right toe  came off. Pt reports burning pain on inside of right big toe and tingling in right hand .Last at home BG in morning before eating was approximately 180-190, and pt reports it is regularly under 300.  Pt reports thirst when BG are high (1-2 times in the past few months), and some disorientation when BG is low. Pt has not had a DR eye exam in over a year. No chest pain or SOB. Pt reports nocturia 2/3x per night, but notes that it has not increased in frequency.   Pt currently smokes 7 cigarettes a day and has cut back recently from 20. Pt reports drinking a 2-3 beers a week. No recreational drug use.Diet includes: Mar 2022, chicken, peanut butter crackers, water (7 bottles of water/day). Pt reports 4-5 hours physical activity per day that has been reduced due to arm injury.   Diabetes Pertinent negatives for diabetes include no chest pain and no fatigue.     Review of Systems  Constitutional: Negative for fatigue and fever.  HENT: Negative for trouble swallowing.   Eyes: Negative for visual disturbance.  Respiratory: Negative for shortness of breath.   Cardiovascular: Negative for chest pain.  Gastrointestinal: Positive for diarrhea.  Endocrine: Negative for heat intolerance.  Genitourinary: Negative for hematuria.  Neurological: Positive for numbness.     has a past medical history of Diabetes mellitus, High cholesterol, Humerus fracture, Hypertension, and Inguinal hernia.  Family History, Social History, current  Medications and allergies reviewed and updated in Epic.  Objective:    Blood pressure (!) 160/95, pulse 94, temperature 98.7 F (37.1 C), height 5\' 7"  (1.702 m), weight 210 lb 6.4 oz (95.4 kg), SpO2 95 %. Physical Exam Constitutional:      Appearance: Normal appearance.  Cardiovascular:     Rate and Rhythm: Normal rate and regular rhythm.     Pulses: Normal pulses.     Heart sounds: Normal heart sounds. No murmur heard.  No friction rub. No gallop.    Pulmonary:     Effort: Pulmonary effort is normal.     Breath sounds: Normal breath sounds. No wheezing, rhonchi or rales.  Abdominal:     Tenderness: There is no right CVA tenderness or left CVA tenderness.  Neurological:     Mental Status: He is alert and oriented to person, place, and time.         Data : I have personally reviewed pertinent labs and imaging studies, if indicated,  with the patient in clinic today.   Lab Orders  No laboratory test(s) ordered today    HC Readings from Last 3 Encounters:  No data found for [facility]    Wt Readings from Last 3 Encounters:  07/01/20 210 lb 6.4 oz (95.4 kg)  06/25/20 206 lb 4.8 oz (93.6 kg)  05/16/20 210 lb (95.3 kg)

## 2020-07-01 NOTE — Progress Notes (Incomplete)
A1c of 7.6 in 06/25/2020 (down from 8.5 in 04/30/2020) UACR of 206 on 01/17/2020  Follow up Diabetes/ [facility]     Patient ID: Justin Landry, male   DOB: 05/30/1961, 59 y.o.   MRN: [id number] Assessment:  Kaysin Brock is a 59 y.o. male who is seen in follow up for No primary diagnosis found. at the request of Iloabachie, Chioma E, NP.  Encounter Diagnoses No diagnosis found.  Assessment    Plan:        There are no Patient Instructions on file for this visit.   No orders of the defined types were placed in this encounter.    Subjective:  HPI   Review of Systems  Alexavier Tsutsui  has a past medical history of Diabetes mellitus, High cholesterol, Humerus fracture, Hypertension, and Inguinal hernia.  Family History, Social History, current Medications and allergies reviewed and updated in Epic.  Objective:    There were no vitals taken for this visit. Physical Exam      Data : I have personally reviewed pertinent labs and imaging studies, if indicated,  with the patient in clinic today.   Lab Orders  No laboratory test(s) ordered today    HC Readings from Last 3 Encounters:  No data found for [facility]    Wt Readings from Last 3 Encounters:  06/25/20 206 lb 4.8 oz (93.6 kg)  05/16/20 210 lb (95.3 kg)  04/30/20 211 lb 14.4 oz (96.1 kg)

## 2020-07-02 ENCOUNTER — Other Ambulatory Visit: Payer: Self-pay

## 2020-07-02 MED ORDER — CLINDAMYCIN HCL 300 MG PO CAPS: 300.0000 mg | ORAL_CAPSULE | Freq: Three times a day (TID) | ORAL | 0 refills | Status: DC

## 2020-07-03 ENCOUNTER — Other Ambulatory Visit: Payer: Self-pay

## 2020-07-03 ENCOUNTER — Encounter: Payer: Self-pay | Admitting: Gerontology

## 2020-07-03 ENCOUNTER — Ambulatory Visit: Payer: Self-pay | Admitting: Gerontology

## 2020-07-03 VITALS — BP 146/90 | HR 104 | Ht 67.0 in | Wt 207.8 lb

## 2020-07-03 DIAGNOSIS — L03031 Cellulitis of right toe: Secondary | ICD-10-CM

## 2020-07-03 DIAGNOSIS — M25511 Pain in right shoulder: Secondary | ICD-10-CM

## 2020-07-03 DIAGNOSIS — E785 Hyperlipidemia, unspecified: Secondary | ICD-10-CM

## 2020-07-03 DIAGNOSIS — E119 Type 2 diabetes mellitus without complications: Secondary | ICD-10-CM

## 2020-07-03 DIAGNOSIS — G8929 Other chronic pain: Secondary | ICD-10-CM

## 2020-07-03 MED ORDER — ROSUVASTATIN CALCIUM 5 MG PO TABS: 5.0000 mg | ORAL_TABLET | Freq: Every day | ORAL | 3 refills | Status: DC

## 2020-07-03 MED ORDER — PIOGLITAZONE HCL 15 MG PO TABS: 15.0000 mg | ORAL_TABLET | Freq: Every day | ORAL | 2 refills | Status: DC

## 2020-07-03 NOTE — Progress Notes (Signed)
Established Patient Office Visit  Subjective:  Patient ID: Justin Landry, male    DOB: 1961/11/15  Age: 59 y.o. MRN: [id number]  CC:  Chief Complaint  Patient presents with  . Diabetes    a1c down from a 8.9 to 8.5 to 7.4  . Nail Problem    big toe right leg infection high pain    HPI Justin Landry is a 59 y.o male presents for follow up of hypertension, type 2 diabetes, lab review and medication refill. He reports being compliant with his treatment regimen and states that he checks his blood pressure and blood glucose daily. He states that he currently smokes 7 cigarettes a day  and continues to work on cutting back. His HgbA1C done on 0714/2021 decreased from 8.5% to 7.6%. He was seen by [facility] Endocrinology team on 07/01/21, and he complained of infection to right great toe and was placed on antibiotics. In addition, he experience excruciating pain to his right shoulder and is schedule to follow up with orthopedics since his HgbA1C is less than 8%.. He denies chest pain, shortness of breath and dyspnea with exertion. Overall, he states that he is doing well and offers no additional compliant at this time .    Past Medical History:  Diagnosis Date  . Diabetes mellitus   . High cholesterol   . Humerus fracture    right  . Hypertension   . Inguinal hernia     Past Surgical History:  Procedure Laterality Date  . AMPUTATION TOE Left 06/23/2019   Procedure: AMPUTATION TOE LEFT AMPUTATION;  Surgeon: Sharlotte Alamo, DPM;  Location: [facility] ORS;  Service: Podiatry;  Laterality: Left;  . AMPUTATION TOE Left 08/31/2019   Procedure: AMPUTATION TOE  [id number];  Surgeon: Sharlotte Alamo, DPM;  Location: [facility] ORS;  Service: Podiatry;  Laterality: Left;  . CYSTOSCOPY  2015   Biopsy  . HERNIA REPAIR Right 2008   [place]    Family History  Problem Relation Age of Onset  . Diabetes Mother   . Hypertension Mother   . Diabetes Father   . Hypertension Father   . Cancer Brother         brain    Social History   Socioeconomic History  . Marital status: Single    Spouse name: Not on file  . Number of children: 0  . Years of education: Not on file  . Highest education level: 8th grade  Occupational History  . Occupation: unemployed  Tobacco Use  . Smoking status: Current Every Day Smoker    Packs/day: 0.25    Types: Cigarettes  . Smokeless tobacco: Never Used  . Tobacco comment: cut down to quarter pack a day, aware of resources  Vaping Use  . Vaping Use: Never used  Substance and Sexual Activity  . Alcohol use: Yes    Alcohol/week: 2.0 standard drinks    Types: 2 Cans of beer per week    Comment: occasional  . Drug use: Yes    Types: Marijuana    Comment: daily - for neuropathy  . Sexual activity: Yes  Other Topics Concern  . Not on file  Social History Narrative   Social determinants completed 02/21/2020. [place] [employer] consent obtained. Referral for job assistance, housing, and gas card were made in [place] portal.   Social Determinants of Health   Financial Resource Strain: High Risk  . Difficulty of Paying Living Expenses: Hard  Food Insecurity: No Food Insecurity  . Worried  About Running Out of Food in the Last Year: Never true  . Ran Out of Food in the Last Year: Never true  Transportation Needs: No Transportation Needs  . Lack of Transportation (Medical): No  . Lack of Transportation (Non-Medical): No  Physical Activity: Sufficiently Active  . Days of Exercise per Week: 7 days  . Minutes of Exercise per Session: 40 min  Stress: Stress Concern Present  . Feeling of Stress : Rather much  Social Connections: Moderately Isolated  . Frequency of Communication with Friends and Family: More than three times a week  . Frequency of Social Gatherings with Friends and Family: Three times a week  . Attends Religious Services: 1 to 4 times per year  . Active Member of Clubs or Organizations: No  . Attends Archivist Meetings: Never  . Marital  Status: Never married  Intimate Partner Violence: Not At Risk  . Fear of Current or Ex-Partner: No  . Emotionally Abused: No  . Physically Abused: No  . Sexually Abused: No    Outpatient Medications Prior to Visit  Medication Sig Dispense Refill  . amLODipine (NORVASC) 10 MG tablet Take 1 tablet (10 mg total) by mouth daily. 30 tablet 3  . Baclofen 5 MG TABS Take 5 mg by mouth 3 (three) times daily as needed. 15 tablet 0  . blood glucose meter kit and supplies KIT Dispense based on patient and insurance preference. Use up to four times daily as directed. (FOR ICD-9 250.00, 250.01). 1 each 0  . clindamycin (CLEOCIN) 300 MG capsule Take 1 capsule (300 mg total) by mouth 3 (three) times daily. 30 capsule 0  . Cyanocobalamin (VITAMIN B 12 PO) Take 1 tablet by mouth daily.    . Fish Oil-Cholecalciferol (FISH OIL + D3) 1000-1000 MG-UNIT CAPS Take 1 capsule by mouth daily with breakfast.     . gabapentin (NEURONTIN) 400 MG capsule Take 1 capsule (400 mg total) by mouth 3 (three) times daily. 90 capsule 0  . glipiZIDE (GLUCOTROL) 10 MG tablet Take 1 tablet (10 mg total) by mouth 2 (two) times daily before a meal. 60 tablet 2  . Glucosamine-Chondroit-Vit C-Mn (GLUCOSAMINE 1500 COMPLEX PO) Take 1 tablet by mouth daily.    . hydrALAZINE (APRESOLINE) 25 MG tablet Take 1 tablet (25 mg total) by mouth 3 (three) times daily. 90 tablet 0  . lisinopril (ZESTRIL) 40 MG tablet TAKE ONE TABLET (40MG) BY MOUTH EVERY DAY 30 tablet 0  . metFORMIN (GLUCOPHAGE) 1000 MG tablet Take 1 tablet (1,000 mg total) by mouth 2 (two) times daily with a meal. 60 tablet 3  . rosuvastatin (CRESTOR) 5 MG tablet Take 1 tablet (5 mg total) by mouth daily at 6 PM. 30 tablet 3  . lidocaine (XYLOCAINE) 2 % solution Use as directed 10 mLs in the mouth or throat as needed. (Patient not taking: Reported on 07/01/2020) 100 mL 0  . mirtazapine (REMERON) 15 MG tablet Take 1 tablet (15 mg total) by mouth at bedtime. 30 tablet 0  . pioglitazone  (ACTOS) 15 MG tablet Take 1 tablet (15 mg total) by mouth daily. 90 tablet 3   No facility-administered medications prior to visit.    Allergies  Allergen Reactions  . Morphine And Related Nausea And Vomiting and Other (See Comments)    Extreme sweating   . Oxycontin [Oxycodone Hcl] Other (See Comments)    Extreme NAusea and vomitting follows (Patient Tolerates Percocet short acting)  . Penicillins Nausea And Vomiting  Has patient had a PCN reaction causing immediate rash, facial/tongue/throat swelling, SOB or lightheadedness with hypotension: No Has patient had a PCN reaction causing severe rash involving mucus membranes or skin necrosis: No Has patient had a PCN reaction that required hospitalization No Has patient had a PCN reaction occurring within the last 10 years: No If all of the above answers are "NO", then may proceed with Cephalosporin use.   . Tramadol Nausea Only    ROS Review of Systems  Constitutional: Negative.   Respiratory: Negative.   Cardiovascular: Negative.   Gastrointestinal: Negative.   Endocrine: Negative.   Genitourinary: Negative.   Musculoskeletal: Positive for myalgias (.Chronic R. shoulder pain ).  Skin:       Erythema to Right great Toe  Neurological: Negative.   Hematological: Negative.   Psychiatric/Behavioral: Negative.       Objective:    Physical Exam Constitutional:      Appearance: Normal appearance.  HENT:     Head: Normocephalic and atraumatic.  Cardiovascular:     Rate and Rhythm: Normal rate and regular rhythm.     Pulses: Normal pulses.     Heart sounds: Normal heart sounds.  Pulmonary:     Effort: Pulmonary effort is normal.     Breath sounds: Normal breath sounds.  Musculoskeletal:        General: Signs of injury (. Chronic Right shoulder injury ) present.  Skin:    Findings: Erythema (.Right great toe, warm to touch) present.  Neurological:     Mental Status: He is alert and oriented to person, place, and time.   Psychiatric:        Mood and Affect: Mood normal.        Behavior: Behavior normal.        Thought Content: Thought content normal.        Judgment: Judgment normal.     BP (!) 146/90 (Patient Position: Sitting)   Pulse 104   Ht 5' 7"  (1.702 m)   Wt (!) 207 lb 12.8 oz (94.3 kg)   SpO2 96%   BMI 32.55 kg/m  Wt Readings from Last 3 Encounters:  07/03/20 (!) 207 lb 12.8 oz (94.3 kg)  07/01/20 210 lb 6.4 oz (95.4 kg)  06/25/20 206 lb 4.8 oz (93.6 kg)     Health Maintenance Due  Topic Date Due  . Hepatitis C Screening  Never done  . PNEUMOCOCCAL POLYSACCHARIDE VACCINE AGE 89-64 HIGH RISK  Never done  . OPHTHALMOLOGY EXAM  Never done  . COVID-19 Vaccine (1) Never done  . HIV Screening  Never done  . COLONOSCOPY  Never done  . FOOT EXAM  06/21/2020    There are no preventive care reminders to display for this patient.  No results found for: TSH Lab Results  Component Value Date   WBC 11.7 (H) 06/25/2020   HGB 16.3 06/25/2020   HCT 48.0 06/25/2020   MCV 92 06/25/2020   PLT 287 06/25/2020   Lab Results  Component Value Date   NA 135 06/25/2020   K 4.3 06/25/2020   CO2 22 06/25/2020   GLUCOSE 286 (H) 06/25/2020   BUN 14 06/25/2020   CREATININE 0.81 06/25/2020   BILITOT 0.4 06/25/2020   ALKPHOS 61 06/25/2020   AST 43 (H) 06/25/2020   ALT 52 (H) 06/25/2020   PROT 7.1 06/25/2020   ALBUMIN 4.5 06/25/2020   CALCIUM 9.9 06/25/2020   ANIONGAP 10 08/28/2019   GFR 87.21 05/27/2015   Lab Results  Component Value Date   CHOL 151 06/25/2020   Lab Results  Component Value Date   HDL 35 (L) 06/25/2020   Lab Results  Component Value Date   LDLCALC 65 06/25/2020   Lab Results  Component Value Date   TRIG 322 (H) 06/25/2020   Lab Results  Component Value Date   CHOLHDL 4.3 06/25/2020   Lab Results  Component Value Date   HGBA1C 7.6 (H) 06/25/2020      Assessment & Plan:  1. Elevated lipids He will continue on current treatment regimen, continue  lowfat/lcholesterol diet and exercise as tolerated - rosuvastatin (CRESTOR) 5 MG tablet; Take 1 tablet (5 mg total) by mouth daily at 6 PM.  Dispense: 30 tablet; Refill: 3  2. Chronic right shoulder pain He was advised to call and schedule appointment with Orthopedic Surgery  3. Cellulitis of toe of right foot Was advised to continue on oral antibiotics, notify clinic or go to the emergency room with worsening symptoms. - Ambulatory referral to Podiatry  4. Type 2 diabetes mellitus without complication, without long-term current use of insulin (HCC) His HgbA1C is 7.6%, and his goal should be <7%. He will continue on current treatment regimen  - pioglitazone (ACTOS) 15 MG tablet; Take 1 tablet (15 mg total) by mouth daily.  Dispense: 30 tablet; Refill: 2 He was advised to check his blood glucose, record and bring log to follow up appointment. His fasting blood glucose should be between 80-130 mg/dL. He will continue on low carb/non concentrated sweet diet and exercise as tolerated.       Follow-up: Return in about 5 weeks (around 08/07/2020), or if symptoms worsen or fail to improve.    Carney Corners, RN

## 2020-07-03 NOTE — Patient Instructions (Addendum)
DASH Eating Plan DASH stands for "Dietary Approaches to Stop Hypertension." The DASH eating plan is a healthy eating plan that has been shown to reduce high blood pressure (hypertension). It may also reduce your risk for type 2 diabetes, heart disease, and stroke. The DASH eating plan may also help with weight loss. What are tips for following this plan?  General guidelines  Avoid eating more than 2,300 mg (milligrams) of salt (sodium) a day. If you have hypertension, you may need to reduce your sodium intake to 1,500 mg a day.  Limit alcohol intake to no more than 1 drink a day for nonpregnant women and 2 drinks a day for men. One drink equals 12 oz of beer, 5 oz of wine, or 1 oz of hard liquor.  Work with your health care provider to maintain a healthy body weight or to lose weight. Ask what an ideal weight is for you.  Get at least 30 minutes of exercise that causes your heart to beat faster (aerobic exercise) most days of the week. Activities may include walking, swimming, or biking.  Work with your health care provider or diet and nutrition specialist (dietitian) to adjust your eating plan to your individual calorie needs. Reading food labels   Check food labels for the amount of sodium per serving. Choose foods with less than 5 percent of the Daily Value of sodium. Generally, foods with less than 300 mg of sodium per serving fit into this eating plan.  To find whole grains, look for the word "whole" as the first word in the ingredient list. Shopping  Buy products labeled as "low-sodium" or "no salt added."  Buy fresh foods. Avoid canned foods and premade or frozen meals. Cooking  Avoid adding salt when cooking. Use salt-free seasonings or herbs instead of table salt or sea salt. Check with your health care provider or pharmacist before using salt substitutes.  Do not fry foods. Cook foods using healthy methods such as baking, boiling, grilling, and broiling instead.  Cook with  heart-healthy oils, such as olive, canola, soybean, or sunflower oil. Meal planning  Eat a balanced diet that includes: ? 5 or more servings of fruits and vegetables each day. At each meal, try to fill half of your plate with fruits and vegetables. ? Up to 6-8 servings of whole grains each day. ? Less than 6 oz of lean meat, poultry, or fish each day. A 3-oz serving of meat is about the same size as a deck of cards. One egg equals 1 oz. ? 2 servings of low-fat dairy each day. ? A serving of nuts, seeds, or beans 5 times each week. ? Heart-healthy fats. Healthy fats called Omega-3 fatty acids are found in foods such as flaxseeds and coldwater fish, like sardines, salmon, and mackerel.  Limit how much you eat of the following: ? Canned or prepackaged foods. ? Food that is high in trans fat, such as fried foods. ? Food that is high in saturated fat, such as fatty meat. ? Sweets, desserts, sugary drinks, and other foods with added sugar. ? Full-fat dairy products.  Do not salt foods before eating.  Try to eat at least 2 vegetarian meals each week.  Eat more home-cooked food and less restaurant, buffet, and fast food.  When eating at a restaurant, ask that your food be prepared with less salt or no salt, if possible. What foods are recommended? The items listed may not be a complete list. Talk with your dietitian about   what dietary choices are best for you. Grains Whole-grain or whole-wheat bread. Whole-grain or whole-wheat pasta. Brown rice. Oatmeal. Quinoa. Bulgur. Whole-grain and low-sodium cereals. Pita bread. Low-fat, low-sodium crackers. Whole-wheat flour tortillas. Vegetables Fresh or frozen vegetables (raw, steamed, roasted, or grilled). Low-sodium or reduced-sodium tomato and vegetable juice. Low-sodium or reduced-sodium tomato sauce and tomato paste. Low-sodium or reduced-sodium canned vegetables. Fruits All fresh, dried, or frozen fruit. Canned fruit in natural juice (without  added sugar). Meat and other protein foods Skinless chicken or turkey. Ground chicken or turkey. Pork with fat trimmed off. Fish and seafood. Egg whites. Dried beans, peas, or lentils. Unsalted nuts, nut butters, and seeds. Unsalted canned beans. Lean cuts of beef with fat trimmed off. Low-sodium, lean deli meat. Dairy Low-fat (1%) or fat-free (skim) milk. Fat-free, low-fat, or reduced-fat cheeses. Nonfat, low-sodium ricotta or cottage cheese. Low-fat or nonfat yogurt. Low-fat, low-sodium cheese. Fats and oils Soft margarine without trans fats. Vegetable oil. Low-fat, reduced-fat, or light mayonnaise and salad dressings (reduced-sodium). Canola, safflower, olive, soybean, and sunflower oils. Avocado. Seasoning and other foods Herbs. Spices. Seasoning mixes without salt. Unsalted popcorn and pretzels. Fat-free sweets. What foods are not recommended? The items listed may not be a complete list. Talk with your dietitian about what dietary choices are best for you. Grains Baked goods made with fat, such as croissants, muffins, or some breads. Dry pasta or rice meal packs. Vegetables Creamed or fried vegetables. Vegetables in a cheese sauce. Regular canned vegetables (not low-sodium or reduced-sodium). Regular canned tomato sauce and paste (not low-sodium or reduced-sodium). Regular tomato and vegetable juice (not low-sodium or reduced-sodium). Pickles. Olives. Fruits Canned fruit in a light or heavy syrup. Fried fruit. Fruit in cream or butter sauce. Meat and other protein foods Fatty cuts of meat. Ribs. Fried meat. Bacon. Sausage. Bologna and other processed lunch meats. Salami. Fatback. Hotdogs. Bratwurst. Salted nuts and seeds. Canned beans with added salt. Canned or smoked fish. Whole eggs or egg yolks. Chicken or turkey with skin. Dairy Whole or 2% milk, cream, and half-and-half. Whole or full-fat cream cheese. Whole-fat or sweetened yogurt. Full-fat cheese. Nondairy creamers. Whipped toppings.  Processed cheese and cheese spreads. Fats and oils Butter. Stick margarine. Lard. Shortening. Ghee. Bacon fat. Tropical oils, such as coconut, palm kernel, or palm oil. Seasoning and other foods Salted popcorn and pretzels. Onion salt, garlic salt, seasoned salt, table salt, and sea salt. Worcestershire sauce. Tartar sauce. Barbecue sauce. Teriyaki sauce. Soy sauce, including reduced-sodium. Steak sauce. Canned and packaged gravies. Fish sauce. Oyster sauce. Cocktail sauce. Horseradish that you find on the shelf. Ketchup. Mustard. Meat flavorings and tenderizers. Bouillon cubes. Hot sauce and Tabasco sauce. Premade or packaged marinades. Premade or packaged taco seasonings. Relishes. Regular salad dressings. Where to find more information:  National Heart, Lung, and Blood Institute: www.nhlbi.nih.gov  American Heart Association: www.heart.org Summary  The DASH eating plan is a healthy eating plan that has been shown to reduce high blood pressure (hypertension). It may also reduce your risk for type 2 diabetes, heart disease, and stroke.  With the DASH eating plan, you should limit salt (sodium) intake to 2,300 mg a day. If you have hypertension, you may need to reduce your sodium intake to 1,500 mg a day.  When on the DASH eating plan, aim to eat more fresh fruits and vegetables, whole grains, lean proteins, low-fat dairy, and heart-healthy fats.  Work with your health care provider or diet and nutrition specialist (dietitian) to adjust your eating plan to your   individual calorie needs. This information is not intended to replace advice given to you by your health care provider. Make sure you discuss any questions you have with your health care provider. Document Revised: 11/11/2017 Document Reviewed: 11/22/2016 Elsevier Patient Education  2020 Elsevier Inc. Carbohydrate Counting for Diabetes Mellitus, Adult  Carbohydrate counting is a method of keeping track of how many carbohydrates you  eat. Eating carbohydrates naturally increases the amount of sugar (glucose) in the blood. Counting how many carbohydrates you eat helps keep your blood glucose within normal limits, which helps you manage your diabetes (diabetes mellitus). It is important to know how many carbohydrates you can safely have in each meal. This is different for every person. A diet and nutrition specialist (registered dietitian) can help you make a meal plan and calculate how many carbohydrates you should have at each meal and snack. Carbohydrates are found in the following foods:  Grains, such as breads and cereals.  Dried beans and soy products.  Starchy vegetables, such as potatoes, peas, and corn.  Fruit and fruit juices.  Milk and yogurt.  Sweets and snack foods, such as cake, cookies, candy, chips, and soft drinks. How do I count carbohydrates? There are two ways to count carbohydrates in food. You can use either of the methods or a combination of both. Reading "Nutrition Facts" on packaged food The "Nutrition Facts" list is included on the labels of almost all packaged foods and beverages in the [place] It includes:  The serving size.  Information about nutrients in each serving, including the grams (g) of carbohydrate per serving. To use the "Nutrition Facts":  Decide how many servings you will have.  Multiply the number of servings by the number of carbohydrates per serving.  The resulting number is the total amount of carbohydrates that you will be having. Learning standard serving sizes of other foods When you eat carbohydrate foods that are not packaged or do not include "Nutrition Facts" on the label, you need to measure the servings in order to count the amount of carbohydrates:  Measure the foods that you will eat with a food scale or measuring cup, if needed.  Decide how many standard-size servings you will eat.  Multiply the number of servings by 15. Most carbohydrate-rich foods have  about 15 g of carbohydrates per serving. ? For example, if you eat 8 oz (170 g) of strawberries, you will have eaten 2 servings and 30 g of carbohydrates (2 servings x 15 g = 30 g).  For foods that have more than one food mixed, such as soups and casseroles, you must count the carbohydrates in each food that is included. The following list contains standard serving sizes of common carbohydrate-rich foods. Each of these servings has about 15 g of carbohydrates:   hamburger bun or  English muffin.   oz (15 mL) syrup.   oz (14 g) jelly.  1 slice of bread.  1 six-inch tortilla.  3 oz (85 g) cooked rice or pasta.  4 oz (113 g) cooked dried beans.  4 oz (113 g) starchy vegetable, such as peas, corn, or potatoes.  4 oz (113 g) hot cereal.  4 oz (113 g) mashed potatoes or  of a large baked potato.  4 oz (113 g) canned or frozen fruit.  4 oz (120 mL) fruit juice.  4-6 crackers.  6 chicken nuggets.  6 oz (170 g) unsweetened dry cereal.  6 oz (170 g) plain fat-free yogurt or yogurt sweetened with   artificial sweeteners.  8 oz (240 mL) milk.  8 oz (170 g) fresh fruit or one small piece of fruit.  24 oz (680 g) popped popcorn. Example of carbohydrate counting Sample meal  3 oz (85 g) chicken breast.  6 oz (170 g) brown rice.  4 oz (113 g) corn.  8 oz (240 mL) milk.  8 oz (170 g) strawberries with sugar-free whipped topping. Carbohydrate calculation 1. Identify the foods that contain carbohydrates: ? Rice. ? Corn. ? Milk. ? Strawberries. 2. Calculate how many servings you have of each food: ? 2 servings rice. ? 1 serving corn. ? 1 serving milk. ? 1 serving strawberries. 3. Multiply each number of servings by 15 g: ? 2 servings rice x 15 g = 30 g. ? 1 serving corn x 15 g = 15 g. ? 1 serving milk x 15 g = 15 g. ? 1 serving strawberries x 15 g = 15 g. 4. Add together all of the amounts to find the total grams of carbohydrates eaten: ? 30 g + 15 g + 15 g + 15  g = 75 g of carbohydrates total. Summary  Carbohydrate counting is a method of keeping track of how many carbohydrates you eat.  Eating carbohydrates naturally increases the amount of sugar (glucose) in the blood.  Counting how many carbohydrates you eat helps keep your blood glucose within normal limits, which helps you manage your diabetes.  A diet and nutrition specialist (registered dietitian) can help you make a meal plan and calculate how many carbohydrates you should have at each meal and snack. This information is not intended to replace advice given to you by your health care provider. Make sure you discuss any questions you have with your health care provider. Document Revised: 06/23/2017 Document Reviewed: 05/12/2016 Elsevier Patient Education  2020 Elsevier Inc.  

## 2020-07-14 ENCOUNTER — Encounter: Payer: Self-pay | Admitting: Emergency Medicine

## 2020-07-14 ENCOUNTER — Emergency Department
Admission: EM | Admit: 2020-07-14 | Discharge: 2020-07-14 | Disposition: A | Discharge status: Home / Self Care | Payer: Self-pay | Attending: Emergency Medicine | Admitting: Emergency Medicine

## 2020-07-14 ENCOUNTER — Other Ambulatory Visit: Payer: Self-pay

## 2020-07-14 DIAGNOSIS — L089 Local infection of the skin and subcutaneous tissue, unspecified: Secondary | ICD-10-CM | POA: Insufficient documentation

## 2020-07-14 DIAGNOSIS — Z5321 Procedure and treatment not carried out due to patient leaving prior to being seen by health care provider: Secondary | ICD-10-CM | POA: Insufficient documentation

## 2020-07-14 LAB — COMPREHENSIVE METABOLIC PANEL
ALT: 49 U/L — ABNORMAL HIGH (ref 0–44)
AST: 33 U/L (ref 15–41)
Albumin: 4.5 g/dL (ref 3.5–5.0)
Alkaline Phosphatase: 57 U/L (ref 38–126)
Anion gap: 9 (ref 5–15)
BUN: 15 mg/dL (ref 6–20)
CO2: 25 mmol/L (ref 22–32)
Calcium: 9.7 mg/dL (ref 8.9–10.3)
Chloride: 101 mmol/L (ref 98–111)
Creatinine, Ser: 0.72 mg/dL (ref 0.61–1.24)
GFR calc Af Amer: >60 mL/min (ref >60)
GFR calc non Af Amer: >60 mL/min (ref >60)
Glucose, Bld: 197 mg/dL — ABNORMAL HIGH (ref 70–99)
Potassium: 4 mmol/L (ref 3.5–5.1)
Sodium: 135 mmol/L (ref 135–145)
Total Bilirubin: 0.9 mg/dL (ref 0.3–1.2)
Total Protein: 8.1 g/dL (ref 6.5–8.1)

## 2020-07-14 LAB — CBC WITH DIFFERENTIAL/PLATELET
Abs Immature Granulocytes: 0.05 10*3/uL (ref 0.00–0.07)
Basophils Absolute: 0.1 10*3/uL (ref 0.0–0.1)
Basophils Relative: 1 %
Eosinophils Absolute: 0.4 10*3/uL (ref 0.0–0.5)
Eosinophils Relative: 4 %
HCT: 45.6 % (ref 39.0–52.0)
Hemoglobin: 16.4 g/dL (ref 13.0–17.0)
Immature Granulocytes: 1 %
Lymphocytes Relative: 25 %
Lymphs Abs: 2.5 10*3/uL (ref 0.7–4.0)
MCH: 31.8 pg (ref 26.0–34.0)
MCHC: 36 g/dL (ref 30.0–36.0)
MCV: 88.5 fL (ref 80.0–100.0)
Monocytes Absolute: 0.8 10*3/uL (ref 0.1–1.0)
Monocytes Relative: 8 %
Neutro Abs: 6.3 10*3/uL (ref 1.7–7.7)
Neutrophils Relative %: 61 %
Platelets: 342 10*3/uL (ref 150–400)
RBC: 5.15 MIL/uL (ref 4.22–5.81)
RDW: 12.7 % (ref 11.5–15.5)
WBC: 10.1 10*3/uL (ref 4.0–10.5)
nRBC: 0 % (ref 0.0–0.2)

## 2020-07-14 LAB — GLUCOSE, CAPILLARY: Glucose-Capillary: 191 mg/dL — ABNORMAL HIGH (ref 70–99)

## 2020-07-14 NOTE — ED Triage Notes (Signed)
Pt reports has an infection on his right foot. Pt states that his MD put him on Clindamycin but it has not helped. Pt reports has diabetes and has had a couple of toes amputated and is concerned about this infection.

## 2020-07-15 ENCOUNTER — Ambulatory Visit: Payer: Self-pay | Admitting: Gerontology

## 2020-07-15 VITALS — BP 143/91 | HR 88

## 2020-07-15 DIAGNOSIS — M79674 Pain in right toe(s): Secondary | ICD-10-CM

## 2020-07-15 MED ORDER — IBUPROFEN 600 MG PO TABS: 600.0000 mg | ORAL_TABLET | Freq: Two times a day (BID) | ORAL | 0 refills | Status: DC

## 2020-07-15 NOTE — Progress Notes (Signed)
Established Patient Office Visit  Subjective:  Patient ID: Justin Landry, male    DOB: May 03, 1961  Age: 59 y.o. MRN: [id number]  CC: No chief complaint on file.   HPI Justin Landry presents for c/o of swelling and pain to right great toe that has been going on for more than 2 weeks. He states that he completed Clindamycin 300 mg tid yesterday for right great toe cellulitis. He states that he continues to experience constant non radiating 9/10 throbbing pain to right great toe. He states that the toe swells when he stands and walks for more than one hour. He was at the ED yesterday for erythema and swelling to his right great toe, but he left after waiting for more than 3 hours. He denies fever, and chills, he states that he's doing well and offers no further complaint.   Past Medical History:  Diagnosis Date  . Diabetes mellitus   . High cholesterol   . Humerus fracture    right  . Hypertension   . Inguinal hernia     Past Surgical History:  Procedure Laterality Date  . AMPUTATION TOE Left 06/23/2019   Procedure: AMPUTATION TOE LEFT AMPUTATION;  Surgeon: Sharlotte Alamo, DPM;  Location: [facility] ORS;  Service: Podiatry;  Laterality: Left;  . AMPUTATION TOE Left 08/31/2019   Procedure: AMPUTATION TOE  IPJ [id number];  Surgeon: Sharlotte Alamo, DPM;  Location: [facility] ORS;  Service: Podiatry;  Laterality: Left;  . CYSTOSCOPY  2015   Biopsy  . HERNIA REPAIR Right 2008   [place]    Family History  Problem Relation Age of Onset  . Diabetes Mother   . Hypertension Mother   . Diabetes Father   . Hypertension Father   . Cancer Brother        brain    Social History   Socioeconomic History  . Marital status: Single    Spouse name: Not on file  . Number of children: 0  . Years of education: Not on file  . Highest education level: 8th grade  Occupational History  . Occupation: unemployed  Tobacco Use  . Smoking status: Current Every Day Smoker    Packs/day: 0.25     Types: Cigarettes  . Smokeless tobacco: Never Used  . Tobacco comment: cut down to quarter pack a day, aware of resources  Vaping Use  . Vaping Use: Never used  Substance and Sexual Activity  . Alcohol use: Yes    Alcohol/week: 2.0 standard drinks    Types: 2 Cans of beer per week    Comment: occasional  . Drug use: Yes    Types: Marijuana    Comment: daily - for neuropathy  . Sexual activity: Yes  Other Topics Concern  . Not on file  Social History Narrative   Social determinants completed 02/21/2020. [place] consent obtained. Referral for job assistance, housing, and gas card were made in [employer] [place] portal.   Social Determinants of Health   Financial Resource Strain: High Risk  . Difficulty of Paying Living Expenses: Hard  Food Insecurity: No Food Insecurity  . Worried About Charity fundraiser in the Last Year: Never true  . Ran Out of Food in the Last Year: Never true  Transportation Needs: No Transportation Needs  . Lack of Transportation (Medical): No  . Lack of Transportation (Non-Medical): No  Physical Activity: Sufficiently Active  . Days of Exercise per Week: 7 days  . Minutes of Exercise per Session: 40 min  Stress: Stress Concern Present  . Feeling of Stress : Rather much  Social Connections: Moderately Isolated  . Frequency of Communication with Friends and Family: More than three times a week  . Frequency of Social Gatherings with Friends and Family: Three times a week  . Attends Religious Services: 1 to 4 times per year  . Active Member of Clubs or Organizations: No  . Attends Archivist Meetings: Never  . Marital Status: Never married  Intimate Partner Violence: Not At Risk  . Fear of Current or Ex-Partner: No  . Emotionally Abused: No  . Physically Abused: No  . Sexually Abused: No    Outpatient Medications Prior to Visit  Medication Sig Dispense Refill  . amLODipine (NORVASC) 10 MG tablet Take 1 tablet (10 mg total) by mouth daily. 30 tablet 3   . Baclofen 5 MG TABS Take 5 mg by mouth 3 (three) times daily as needed. 15 tablet 0  . Cyanocobalamin (VITAMIN B 12 PO) Take 1 tablet by mouth daily.    . Fish Oil-Cholecalciferol (FISH OIL + D3) 1000-1000 MG-UNIT CAPS Take 1 capsule by mouth daily with breakfast.     . gabapentin (NEURONTIN) 400 MG capsule Take 1 capsule (400 mg total) by mouth 3 (three) times daily. 90 capsule 0  . glipiZIDE (GLUCOTROL) 10 MG tablet Take 1 tablet (10 mg total) by mouth 2 (two) times daily before a meal. 60 tablet 2  . Glucosamine-Chondroit-Vit C-Mn (GLUCOSAMINE 1500 COMPLEX PO) Take 1 tablet by mouth daily.    . hydrALAZINE (APRESOLINE) 25 MG tablet Take 1 tablet (25 mg total) by mouth 3 (three) times daily. 90 tablet 0  . metFORMIN (GLUCOPHAGE) 1000 MG tablet Take 1 tablet (1,000 mg total) by mouth 2 (two) times daily with a meal. 60 tablet 3  . rosuvastatin (CRESTOR) 5 MG tablet Take 1 tablet (5 mg total) by mouth daily at 6 PM. 30 tablet 3  . blood glucose meter kit and supplies KIT Dispense based on patient and insurance preference. Use up to four times daily as directed. (FOR ICD-9 250.00, 250.01). 1 each 0  . lisinopril (ZESTRIL) 40 MG tablet TAKE ONE TABLET (40MG) BY MOUTH EVERY DAY 30 tablet 0  . mirtazapine (REMERON) 15 MG tablet Take 1 tablet (15 mg total) by mouth at bedtime. 30 tablet 0  . pioglitazone (ACTOS) 15 MG tablet Take 1 tablet (15 mg total) by mouth daily. 30 tablet 2  . clindamycin (CLEOCIN) 300 MG capsule Take 1 capsule (300 mg total) by mouth 3 (three) times daily. 30 capsule 0  . lidocaine (XYLOCAINE) 2 % solution Use as directed 10 mLs in the mouth or throat as needed. (Patient not taking: Reported on 07/01/2020) 100 mL 0   No facility-administered medications prior to visit.    Allergies  Allergen Reactions  . Morphine And Related Nausea And Vomiting and Other (See Comments)    Extreme sweating   . Oxycontin [Oxycodone Hcl] Other (See Comments)    Extreme NAusea and vomitting  follows (Patient Tolerates Percocet short acting)  . Penicillins Nausea And Vomiting    Has patient had a PCN reaction causing immediate rash, facial/tongue/throat swelling, SOB or lightheadedness with hypotension: No Has patient had a PCN reaction causing severe rash involving mucus membranes or skin necrosis: No Has patient had a PCN reaction that required hospitalization No Has patient had a PCN reaction occurring within the last 10 years: No If all of the above answers are "NO", then may  proceed with Cephalosporin use.   . Tramadol Nausea Only    ROS Review of Systems  Constitutional: Negative.   Respiratory: Negative.   Cardiovascular: Negative.   Musculoskeletal: Positive for arthralgias (Right great toe).  Neurological: Negative.       Objective:    Physical Exam Cardiovascular:     Rate and Rhythm: Normal rate and regular rhythm.     Pulses: Normal pulses.     Heart sounds: Normal heart sounds.  Pulmonary:     Effort: Pulmonary effort is normal.     Breath sounds: Normal breath sounds.  Musculoskeletal:        General: Tenderness (with touch to right great toe) present.  Skin:    General: Skin is warm.     Findings: No erythema.  Neurological:     General: No focal deficit present.     Mental Status: He is alert and oriented to person, place, and time. Mental status is at baseline.     BP (!) 143/91 (BP Location: Left Arm, Patient Position: Sitting, Cuff Size: Normal)   Pulse 88  Wt Readings from Last 3 Encounters:  07/14/20 (!) 207 lb (93.9 kg)  07/03/20 (!) 207 lb 12.8 oz (94.3 kg)  07/01/20 210 lb 6.4 oz (95.4 kg)     Health Maintenance Due  Topic Date Due  . Hepatitis C Screening  Never done  . PNEUMOCOCCAL POLYSACCHARIDE VACCINE AGE 58-64 HIGH RISK  Never done  . OPHTHALMOLOGY EXAM  Never done  . COVID-19 Vaccine (1) Never done  . HIV Screening  Never done  . COLONOSCOPY  Never done  . FOOT EXAM  06/21/2020  . INFLUENZA VACCINE  07/13/2020     There are no preventive care reminders to display for this patient.  No results found for: TSH Lab Results  Component Value Date   WBC 10.1 07/14/2020   HGB 16.4 07/14/2020   HCT 45.6 07/14/2020   MCV 88.5 07/14/2020   PLT 342 07/14/2020   Lab Results  Component Value Date   NA 135 07/14/2020   K 4.0 07/14/2020   CO2 25 07/14/2020   GLUCOSE 197 (H) 07/14/2020   BUN 15 07/14/2020   CREATININE 0.72 07/14/2020   BILITOT 0.9 07/14/2020   ALKPHOS 57 07/14/2020   AST 33 07/14/2020   ALT 49 (H) 07/14/2020   PROT 8.1 07/14/2020   ALBUMIN 4.5 07/14/2020   CALCIUM 9.7 07/14/2020   ANIONGAP 9 07/14/2020   GFR 87.21 05/27/2015   Lab Results  Component Value Date   CHOL 151 06/25/2020   Lab Results  Component Value Date   HDL 35 (L) 06/25/2020   Lab Results  Component Value Date   LDLCALC 65 06/25/2020   Lab Results  Component Value Date   TRIG 322 (H) 06/25/2020   Lab Results  Component Value Date   CHOLHDL 4.3 06/25/2020   Lab Results  Component Value Date   HGBA1C 7.6 (H) 06/25/2020      Assessment & Plan:   1. Pain of toe of right foot - Their is no erythema nor edema to right great toe, but tenderness with touch. He was advised to continue on 600 mg Ibuprofen bid, with meals. He was educated on medication side effects and advised to notify clinic. He was advised to elevate right leg,apply ice as needed to right great toe. -He was advised to follow up with Podiatrist Dr Posey Pronto on 07/21/2020. He is to go to the ED with worsening symptoms. -  ibuprofen (ADVIL) 600 MG tablet; Take 1 tablet (600 mg total) by mouth 2 (two) times daily.  Dispense: 20 tablet; Refill: 0     Follow-up: Return in about 23 days (around 08/07/2020), or if symptoms worsen or fail to improve.    Grabiela Wohlford Jerold Coombe, NP

## 2020-07-16 ENCOUNTER — Ambulatory Visit (INDEPENDENT_AMBULATORY_CARE_PROVIDER_SITE_OTHER): Payer: Self-pay | Admitting: Orthopedic Surgery

## 2020-07-16 DIAGNOSIS — M12811 Other specific arthropathies, not elsewhere classified, right shoulder: Secondary | ICD-10-CM

## 2020-07-17 ENCOUNTER — Encounter: Payer: Self-pay | Admitting: Orthopedic Surgery

## 2020-07-17 NOTE — Progress Notes (Signed)
Office Visit Note   Patient: Justin Landry           Date of Birth: December 19, 1960           MRN: [id number] Visit Date: 07/16/2020 Requested by: Rolm Gala, NP [address] [place],  [place] [address] PCP: Rolm Gala, NP  Subjective: Chief Complaint  Patient presents with  . Shoulder Pain    HPI: Justin Landry is a 59 year old patient with right shoulder rotator cuff arthropathy. Last seen 1020. He has not had prior surgery on the right shoulder. He is right-hand dominant. Currently is unemployed. He has been able to get his hemoglobin A1c down to 7.6. Describes both pain and weakness in the right shoulder region. 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 He states that he has "bad pain" in the right shoulder.              ROS: All systems reviewed are negative as they relate to the chief complaint within the history of present illness.  Patient denies  fevers or chills.   Assessment & Plan: Visit Diagnoses:  1. Rotator cuff arthropathy of right shoulder     Plan: Impression is rotator cuff arthropathy right shoulder. Had a long talk with Justin Landry about treatment options. He has had injections in the past. He really has debilitating pain on a daily basis. Discussed with him the limitations of activity with reverse shoulder replacement which is his only option for treatment. 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 The risk and benefits of reverse shoulder replacement are discussed including not limited to infection nerve vessel damage potential need for revision in his lifetime along with dislocation and incomplete pain relief and incomplete restoration of function. Did caution him that he would really have to limit his lifting with that reverse shoulder replacement in order to maintain its longevity. Patient does desire a better quality of life which she is not able to achieve at this time  due to persistent and debilitating shoulder pain and weakness. Patient understands the risk and benefits and wishes to proceed. We will obtain a thin cut CT scan to evaluate for reverse shoulder replacement to obtain optimal implant positioning for longevity. I do not need to see him back after the CT scan. He does wish to proceed with surgery. All questions answered  Follow-Up Instructions: No follow-ups on file.   Orders:  Orders Placed This Encounter  Procedures  . CT SHOULDER RIGHT WO CONTRAST   No orders of the defined types were placed in this encounter.     Procedures: No procedures performed   Clinical Data: No additional findings.  Objective: Vital Signs: There were no vitals taken for this visit.  Physical Exam:   Constitutional: Patient appears well-developed HEENT:  Head: Normocephalic Eyes:EOM are normal Neck: Normal range of motion Cardiovascular: Normal rate Pulmonary/chest: Effort normal Neurologic: Patient is alert Skin: Skin is warm Psychiatric: Patient has normal mood and affect    Ortho Exam: Ortho exam demonstrates forward flexion and abduction both just below 90 degrees. Subscap strength is 5 out of 5. Supraspinatus and infraspinatus strength weaker on the right compared to the left. Deltoid is functional. Popeye deformity is present. Skin is intact in the right shoulder girdle region. Neck range of motion is full.  Specialty Comments:  No specialty comments available.  Imaging: No results found.   PMFS History: Patient Active Problem List  Diagnosis Date Noted  . Chronic left shoulder pain 04/10/2020  . Toe pain 02/21/2020  . Anxiety 02/08/2020  . Left knee pain 02/07/2020  . Inguinodynia, right 11/13/2019  . Elevated lipids 10/04/2019  . 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 History of right inguinal hernia 08/14/2019  . Encounter to establish care  08/14/2019  . Anxiety 08/14/2019  . Right shoulder pain 08/14/2019  . Cellulitis 06/22/2019  . Essential hypertension 05/27/2015  . Type 2 diabetes mellitus without complication (HCC) 05/27/2015  . Inguinal hernia, bilateral  04/10/2014   Past Medical History:  Diagnosis Date  . Diabetes mellitus   . High cholesterol   . Humerus fracture    right  . Hypertension   . Inguinal hernia     Family History  Problem Relation Age of Onset  . Diabetes Mother   . Hypertension Mother   . Diabetes Father   . Hypertension Father   . Cancer Brother        brain    Past Surgical History:  Procedure Laterality Date  . AMPUTATION TOE Left 06/23/2019   Procedure: AMPUTATION TOE LEFT AMPUTATION;  Surgeon: Linus Galas, DPM;  Location: [facility] ORS;  Service: Podiatry;  Laterality: Left;  . AMPUTATION TOE Left 08/31/2019   Procedure: AMPUTATION TOE  IPJ [id number];  Surgeon: Linus Galas, DPM;  Location: [facility] ORS;  Service: Podiatry;  Laterality: Left;  . CYSTOSCOPY  2015   Biopsy  . HERNIA REPAIR Right 2008   [facility]   Social History   Occupational History  . Occupation: unemployed  Tobacco Use  . Smoking status: Current Every Day Smoker    Packs/day: 0.25    Types: Cigarettes  . Smokeless tobacco: Never Used  . Tobacco comment: cut down to quarter pack a day, aware of resources  Vaping Use  . Vaping Use: Never used  Substance and Sexual Activity  . Alcohol use: Yes    Alcohol/week: 2.0 standard drinks    Types: 2 Cans of beer per week    Comment: occasional  . Drug use: Yes    Types: Marijuana    Comment: daily - for neuropathy  . Sexual activity: Yes

## 2020-07-21 ENCOUNTER — Other Ambulatory Visit: Payer: Self-pay

## 2020-07-21 ENCOUNTER — Encounter: Payer: Self-pay | Admitting: Podiatry

## 2020-07-21 ENCOUNTER — Ambulatory Visit (INDEPENDENT_AMBULATORY_CARE_PROVIDER_SITE_OTHER): Payer: Self-pay | Admitting: Podiatry

## 2020-07-21 DIAGNOSIS — L600 Ingrowing nail: Secondary | ICD-10-CM

## 2020-07-21 DIAGNOSIS — M79674 Pain in right toe(s): Secondary | ICD-10-CM

## 2020-07-22 ENCOUNTER — Encounter: Payer: Self-pay | Admitting: Podiatry

## 2020-07-22 NOTE — Progress Notes (Signed)
Subjective:  Patient ID: Justin Landry, male    DOB: 1961-03-02,  MRN: [id number]  Chief Complaint  Patient presents with   Toe Pain    Patient presents today for possible ingrown toenail right hallux x2-3 months.  He says at the base of the nail bed, its red, stings and burns and swells when his foot is flat.  He was given clindamycin several weeks ago for toe infection which has not helped    59 y.o. male presents with the above complaint. Patient presents with complaint of right lateral hallux ingrown that has been hurting for 2 to 3 months.  Patient states that it is painful.  Patient states he has hard time walking on it.  He would like to have removed.  Patient is a diabetic with last A1c of 7.6.  With good circulation.  He denies any other acute complaints.  He would like to have removed.  No clinical signs of infection noted.   Review of Systems: Negative except as noted in the HPI. Denies N/V/F/Ch.  Past Medical History:  Diagnosis Date   Diabetes mellitus    High cholesterol    Humerus fracture    right   Hypertension    Inguinal hernia     Current Outpatient Medications:    amLODipine (NORVASC) 10 MG tablet, Take 1 tablet (10 mg total) by mouth daily., Disp: 30 tablet, Rfl: 3   ascorbic acid (VITAMIN C) 500 MG tablet, Take by mouth., Disp: , Rfl:    Baclofen 5 MG TABS, Take 5 mg by mouth 3 (three) times daily as needed., Disp: 15 tablet, Rfl: 0   blood glucose meter kit and supplies KIT, Dispense based on patient and insurance preference. Use up to four times daily as directed. (FOR ICD-9 250.00, 250.01)., Disp: 1 each, Rfl: 0   Cyanocobalamin (VITAMIN B 12 PO), Take 1 tablet by mouth daily., Disp: , Rfl:    Fish Oil-Cholecalciferol (FISH OIL + D3) 1000-1000 MG-UNIT CAPS, Take 1 capsule by mouth daily with breakfast. , Disp: , Rfl:    gabapentin (NEURONTIN) 400 MG capsule, Take 1 capsule (400 mg total) by mouth 3 (three) times daily., Disp: 90 capsule,  Rfl: 0   glipiZIDE (GLUCOTROL) 10 MG tablet, Take 1 tablet (10 mg total) by mouth 2 (two) times daily before a meal., Disp: 60 tablet, Rfl: 2   Glucosamine-Chondroit-Vit C-Mn (GLUCOSAMINE 1500 COMPLEX PO), Take 1 tablet by mouth daily., Disp: , Rfl:    hydrALAZINE (APRESOLINE) 25 MG tablet, Take 1 tablet (25 mg total) by mouth 3 (three) times daily., Disp: 90 tablet, Rfl: 0   ibuprofen (ADVIL) 600 MG tablet, Take 1 tablet (600 mg total) by mouth 2 (two) times daily., Disp: 20 tablet, Rfl: 0   lisinopril (ZESTRIL) 40 MG tablet, TAKE ONE TABLET (40MG) BY MOUTH EVERY DAY, Disp: 30 tablet, Rfl: 0   metFORMIN (GLUCOPHAGE) 1000 MG tablet, Take 1 tablet (1,000 mg total) by mouth 2 (two) times daily with a meal., Disp: 60 tablet, Rfl: 3   mirtazapine (REMERON) 15 MG tablet, Take 1 tablet (15 mg total) by mouth at bedtime., Disp: 30 tablet, Rfl: 0   pioglitazone (ACTOS) 15 MG tablet, Take 1 tablet (15 mg total) by mouth daily., Disp: 30 tablet, Rfl: 2   rosuvastatin (CRESTOR) 5 MG tablet, Take 1 tablet (5 mg total) by mouth daily at 6 PM., Disp: 30 tablet, Rfl: 3  Social History   Tobacco Use  Smoking Status Current Every Day Smoker  Packs/day: 0.25   Types: Cigarettes  Smokeless Tobacco Never Used  Tobacco Comment   cut down to quarter pack a day, aware of resources    Allergies  Allergen Reactions   Morphine And Related Nausea And Vomiting and Other (See Comments)    Extreme sweating    Oxycontin [Oxycodone Hcl] Other (See Comments)    Extreme NAusea and vomitting follows (Patient Tolerates Percocet short acting)   Penicillins Nausea And Vomiting    Has patient had a PCN reaction causing immediate rash, facial/tongue/throat swelling, SOB or lightheadedness with hypotension: No Has patient had a PCN reaction causing severe rash involving mucus membranes or skin necrosis: No Has patient had a PCN reaction that required hospitalization No Has patient had a PCN reaction occurring  within the last 10 years: No If all of the above answers are "NO", then may proceed with Cephalosporin use.    Tramadol Nausea Only   Objective:  There were no vitals filed for this visit. There is no height or weight on file to calculate BMI. Constitutional Well developed. Well nourished.  Vascular Dorsalis pedis pulses palpable bilaterally. Posterior tibial pulses palpable bilaterally. Capillary refill normal to all digits.  No cyanosis or clubbing noted. Pedal hair growth normal.  Neurologic Normal speech. Oriented to person, place, and time. Epicritic sensation to light touch grossly present bilaterally.  Dermatologic Painful ingrowing nail at lateral nail borders of the hallux nail right. No other open wounds. No skin lesions.  Orthopedic: Normal joint ROM without pain or crepitus bilaterally. No visible deformities. No bony tenderness.   Radiographs: None Assessment:   1. Ingrown toenail of right foot   2. Great toe pain, right    Plan:  Patient was evaluated and treated and all questions answered.  Ingrown Nail, right -Patient elects to proceed with minor surgery to remove ingrown toenail removal today. Consent reviewed and signed by patient. -Ingrown nail excised. See procedure note. -Educated on post-procedure care including soaking. Written instructions provided and reviewed. -Patient to follow up in 2 weeks for nail check. -I discussed with the patient that he is at high risk of losing his toe or limb given that he is diabetic with A1c of 7.6.  I do do feel comfortable proceeding with the procedure given that he has good circulation with A1c below 8 making it less likely to have complication however I did discuss with the patient that there is always a chance that the patient can lose the digit nor the foot or the leg from complication of diabetes.  Patient states her several like to proceed with the procedure.  Procedure: Excision of Ingrown Toenail Location:  Right 1st toe lateral nail borders. Anesthesia: Lidocaine 1% plain; 1.5 mL and Marcaine 0.5% plain; 1.5 mL, digital block. Skin Prep: Betadine. Dressing: Silvadene; telfa; dry, sterile, compression dressing. Technique: Following skin prep, the toe was exsanguinated and a tourniquet was secured at the base of the toe. The affected nail border was freed, split with a nail splitter, and excised. Chemical matrixectomy was then performed with phenol and irrigated out with alcohol. The tourniquet was then removed and sterile dressing applied. Disposition: Patient tolerated procedure well. Patient to return in 2 weeks for follow-up.   No follow-ups on file.

## 2020-07-28 ENCOUNTER — Other Ambulatory Visit: Payer: Self-pay | Admitting: Gerontology

## 2020-07-28 DIAGNOSIS — M79674 Pain in right toe(s): Secondary | ICD-10-CM

## 2020-08-01 ENCOUNTER — Ambulatory Visit
Admission: RE | Admit: 2020-08-01 | Discharge: 2020-08-01 | Disposition: A | Discharge status: Home / Self Care | Payer: No Typology Code available for payment source | Source: Ambulatory Visit | Attending: Orthopedic Surgery | Admitting: Orthopedic Surgery

## 2020-08-01 DIAGNOSIS — M12811 Other specific arthropathies, not elsewhere classified, right shoulder: Secondary | ICD-10-CM

## 2020-08-07 ENCOUNTER — Ambulatory Visit: Payer: Self-pay | Admitting: Gerontology

## 2020-08-07 ENCOUNTER — Telehealth: Payer: Self-pay | Admitting: Orthopedic Surgery

## 2020-08-07 ENCOUNTER — Other Ambulatory Visit: Payer: Self-pay

## 2020-08-07 ENCOUNTER — Other Ambulatory Visit: Payer: Self-pay | Admitting: Gerontology

## 2020-08-07 DIAGNOSIS — E119 Type 2 diabetes mellitus without complications: Secondary | ICD-10-CM

## 2020-08-07 DIAGNOSIS — G8929 Other chronic pain: Secondary | ICD-10-CM

## 2020-08-07 DIAGNOSIS — I10 Essential (primary) hypertension: Secondary | ICD-10-CM

## 2020-08-07 MED ORDER — HYDRALAZINE HCL 25 MG PO TABS: 25.0000 mg | ORAL_TABLET | Freq: Three times a day (TID) | ORAL | 0 refills | Status: DC

## 2020-08-07 MED ORDER — AMLODIPINE BESYLATE 10 MG PO TABS: 10.0000 mg | ORAL_TABLET | Freq: Every day | ORAL | 3 refills | Status: DC

## 2020-08-07 MED ORDER — GABAPENTIN 400 MG PO CAPS: 400.0000 mg | ORAL_CAPSULE | Freq: Three times a day (TID) | ORAL | 0 refills | Status: DC

## 2020-08-07 MED ORDER — GLIPIZIDE 10 MG PO TABS: 10.0000 mg | ORAL_TABLET | Freq: Two times a day (BID) | ORAL | 2 refills | Status: DC

## 2020-08-07 MED ORDER — LISINOPRIL 40 MG PO TABS: ORAL_TABLET | ORAL | 3 refills | Status: DC

## 2020-08-07 MED ORDER — METFORMIN HCL 1000 MG PO TABS: 1000.0000 mg | ORAL_TABLET | Freq: Two times a day (BID) | ORAL | 3 refills | Status: DC

## 2020-08-07 MED ORDER — PIOGLITAZONE HCL 15 MG PO TABS: 15.0000 mg | ORAL_TABLET | Freq: Every day | ORAL | 2 refills | Status: DC

## 2020-08-07 NOTE — Telephone Encounter (Signed)
Patient called needing the results of his MRI. Patient did not want to wait that long for the next available appointment 08/27/2020 to get the results. The number to contact patient is [phone]

## 2020-08-07 NOTE — Progress Notes (Signed)
Established Patient Office Visit  Subjective:  Patient ID: Justin Landry, male    DOB: 1961/09/04  Age: 59 y.o. MRN: [id number]  CC: No chief complaint on file.  Patient consents to telephone visit and 2 patient identifiers was used to identify patient.  HPI Wilfred Dayrit presents for follow up of ingrown toe nail to right great toe. He was seen by Podiatrist Dr Posey Pronto K P on 07/21/2020 and excision of the right ingrown toe nail was performed. Currently, he denies pain ,erythema or discharge from his right toe nail. He was also seen by Dr Francina Ames Orthopedic Surgeon on 07/16/2020 for his right shoulder rotator cuff arthropathy and medication refill. He had CT scan of the right shoulder done on 08/02/2020 and it showed Findings consistent with chronic supraspinatus and infraspinatus tears with 4-5 cm of retraction and moderate to moderately severe fatty atrophy of the muscle bellies. There is at least a high-grade partial tear of the subscapularis with 1-2 cm of retraction and atrophy. High-riding humeral head due to the patient's cuff tears. Sclerosis in the glenoid is consistent with chronic abutment. 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 Pre acromion type os acromiale noted per Dr Lonna Duval. He states that he will follow up with Dr D.G.Scott. He states that he is compliant with his medication and continues to make healthy lifestyle changes. Overall, he states that he's doing well and offers no further complaint.  Past Medical History:  Diagnosis Date   Diabetes mellitus    High cholesterol    Humerus fracture    right   Hypertension    Inguinal hernia     Past Surgical History:  Procedure Laterality Date   AMPUTATION TOE Left 06/23/2019   Procedure: AMPUTATION TOE LEFT AMPUTATION;  Surgeon: Sharlotte Alamo, DPM;  Location: [facility] ORS;  Service: Podiatry;  Laterality: Left;   AMPUTATION TOE Left 08/31/2019    Procedure: AMPUTATION TOE  [id number];  Surgeon: Sharlotte Alamo, DPM;  Location: [facility] OR[facility];  Service: Podiatry;  Laterality: Left;   CYSTOSCOPY  2015   Biopsy   HERNIA REPAIR Right 2008   [place]    Family History  Problem Relation Age of Onset   Diabetes Mother    Hypertension Mother    Diabetes Father    Hypertension Father    Cancer Brother        brain    Social History   Socioeconomic History   Marital status: Single    Spouse name: Not on file   Number of children: 0   Years of education: Not on file   Highest education level: 8th grade  Occupational History   Occupation: unemployed  Tobacco Use   Smoking status: Current Every Day Smoker    Packs/day: 0.25    Types: Cigarettes   Smokeless tobacco: Never Used   Tobacco comment: cut down to quarter pack a day, aware of resources  Vaping Use   Vaping Use: Never used  Substance and Sexual Activity   Alcohol use: Yes    Alcohol/week: 2.0 standard drinks    Types: 2 Cans of beer per week    Comment: occasional   Drug use: Yes    Types: Marijuana    Comment: daily - for neuropathy   Sexual activity: Yes  Other Topics Concern   Not on file  Social History Narrative   Social determinants completed 02/21/2020. [place] [employer] consent obtained. Referral for job assistance, housing, and gas card were  made in [employer] [place] portal.   Social Determinants of Health   Financial Resource Strain: High Risk   Difficulty of Paying Living Expenses: Hard  Food Insecurity: No Food Insecurity   Worried About Charity fundraiser in the Last Year: Never true   Ran Out of Food in the Last Year: Never true  Transportation Needs: No Transportation Needs   Lack of Transportation (Medical): No   Lack of Transportation (Non-Medical): No  Physical Activity: Sufficiently Active   Days of Exercise per Week: 7 days   Minutes of Exercise per Session: 40 min  Stress: Stress Concern Present   Feeling of Stress : Rather much   Social Connections: Moderately Isolated   Frequency of Communication with Friends and Family: More than three times a week   Frequency of Social Gatherings with Friends and Family: Three times a week   Attends Religious Services: 1 to 4 times per year   Active Member of Clubs or Organizations: No   Attends Archivist Meetings: Never   Marital Status: Never married  Human resources officer Violence: Not At Risk   Fear of Current or Ex-Partner: No   Emotionally Abused: No   Physically Abused: No   Sexually Abused: No    Outpatient Medications Prior to Visit  Medication Sig Dispense Refill   ascorbic acid (VITAMIN C) 500 MG tablet Take by mouth.     Baclofen 5 MG TABS Take 5 mg by mouth 3 (three) times daily as needed. 15 tablet 0   blood glucose meter kit and supplies KIT Dispense based on patient and insurance preference. Use up to four times daily as directed. (FOR ICD-9 250.00, 250.01). 1 each 0   Cyanocobalamin (VITAMIN B 12 PO) Take 1 tablet by mouth daily.     Fish Oil-Cholecalciferol (FISH OIL + D3) 1000-1000 MG-UNIT CAPS Take 1 capsule by mouth daily with breakfast.      Glucosamine-Chondroit-Vit C-Mn (GLUCOSAMINE 1500 COMPLEX PO) Take 1 tablet by mouth daily.     ibuprofen (ADVIL) 600 MG tablet Take 1 tablet (600 mg total) by mouth 2 (two) times daily for 10 days. 20 tablet 0   mirtazapine (REMERON) 15 MG tablet Take 1 tablet (15 mg total) by mouth at bedtime. 30 tablet 0   rosuvastatin (CRESTOR) 5 MG tablet Take 1 tablet (5 mg total) by mouth daily at 6 PM. 30 tablet 3   amLODipine (NORVASC) 10 MG tablet Take 1 tablet (10 mg total) by mouth daily. 30 tablet 3   gabapentin (NEURONTIN) 400 MG capsule Take 1 capsule (400 mg total) by mouth 3 (three) times daily for 10 days. 30 capsule 0   glipiZIDE (GLUCOTROL) 10 MG tablet Take 1 tablet (10 mg total) by mouth 2 (two) times daily before a meal. 60 tablet 2   hydrALAZINE (APRESOLINE) 25 MG tablet Take 1  tablet (25 mg total) by mouth 3 (three) times daily. 90 tablet 0   lisinopril (ZESTRIL) 40 MG tablet TAKE ONE TABLET (40MG) BY MOUTH EVERY DAY 30 tablet 0   metFORMIN (GLUCOPHAGE) 1000 MG tablet Take 1 tablet (1,000 mg total) by mouth 2 (two) times daily with a meal. 60 tablet 3   pioglitazone (ACTOS) 15 MG tablet Take 1 tablet (15 mg total) by mouth daily. 30 tablet 2   No facility-administered medications prior to visit.    Allergies  Allergen Reactions   Morphine And Related Nausea And Vomiting and Other (See Comments)    Extreme sweating  Oxycontin [Oxycodone Hcl] Other (See Comments)    Extreme NAusea and vomitting follows (Patient Tolerates Percocet short acting)   Penicillins Nausea And Vomiting    Has patient had a PCN reaction causing immediate rash, facial/tongue/throat swelling, SOB or lightheadedness with hypotension: No Has patient had a PCN reaction causing severe rash involving mucus membranes or skin necrosis: No Has patient had a PCN reaction that required hospitalization No Has patient had a PCN reaction occurring within the last 10 years: No If all of the above answers are "NO", then may proceed with Cephalosporin use.    Tramadol Nausea Only    ROS Review of Systems  Constitutional: Negative.   Respiratory: Negative.   Cardiovascular: Negative.   Endocrine: Negative.   Genitourinary: Negative.   Neurological: Positive for numbness (peripheral neuropathy).      Objective:    Physical Exam No physical exam was done. There were no vitals taken for this visit. Wt Readings from Last 3 Encounters:  07/14/20 (!) 207 lb (93.9 kg)  07/03/20 (!) 207 lb 12.8 oz (94.3 kg)  07/01/20 210 lb 6.4 oz (95.4 kg)     Health Maintenance Due  Topic Date Due   Hepatitis C Screening  Never done   PNEUMOCOCCAL POLYSACCHARIDE VACCINE AGE 29-64 HIGH RISK  Never done   OPHTHALMOLOGY EXAM  Never done   COVID-19 Vaccine (1) Never done   HIV Screening  Never  done   COLONOSCOPY  Never done   FOOT EXAM  06/21/2020   INFLUENZA VACCINE  07/13/2020    There are no preventive care reminders to display for this patient.  No results found for: TSH Lab Results  Component Value Date   WBC 10.1 07/14/2020   HGB 16.4 07/14/2020   HCT 45.6 07/14/2020   MCV 88.5 07/14/2020   PLT 342 07/14/2020   Lab Results  Component Value Date   NA 135 07/14/2020   K 4.0 07/14/2020   CO2 25 07/14/2020   GLUCOSE 197 (H) 07/14/2020   BUN 15 07/14/2020   CREATININE 0.72 07/14/2020   BILITOT 0.9 07/14/2020   ALKPHOS 57 07/14/2020   AST 33 07/14/2020   ALT 49 (H) 07/14/2020   PROT 8.1 07/14/2020   ALBUMIN 4.5 07/14/2020   CALCIUM 9.7 07/14/2020   ANIONGAP 9 07/14/2020   GFR 87.21 05/27/2015   Lab Results  Component Value Date   CHOL 151 06/25/2020   Lab Results  Component Value Date   HDL 35 (L) 06/25/2020   Lab Results  Component Value Date   LDLCALC 65 06/25/2020   Lab Results  Component Value Date   TRIG 322 (H) 06/25/2020   Lab Results  Component Value Date   CHOLHDL 4.3 06/25/2020   Lab Results  Component Value Date   HGBA1C 7.6 (H) 06/25/2020      Assessment & Plan:    1. Type 2 diabetes mellitus without complication, without long-term current use of insulin ([place]) - He will continue on current treatment regimen, low carb / non concentrated sweet diet and exercise as tolerated. - HgB A1c; Future - gabapentin (NEURONTIN) 400 MG capsule; Take 1 capsule (400 mg total) by mouth 3 (three) times daily for 10 days.  Dispense: 30 capsule; Refill: 0 - glipiZIDE (GLUCOTROL) 10 MG tablet; Take 1 tablet (10 mg total) by mouth 2 (two) times daily before a meal.  Dispense: 60 tablet; Refill: 2 - metFORMIN (GLUCOPHAGE) 1000 MG tablet; Take 1 tablet (1,000 mg total) by mouth 2 (two) times daily  with a meal.  Dispense: 60 tablet; Refill: 3 - pioglitazone (ACTOS) 15 MG tablet; Take 1 tablet (15 mg total) by mouth daily.  Dispense: 30 tablet;  Refill: 2  2. Essential hypertension - He will continue on current treatment regimen, DASH diet and exercise as tolerated. - amLODipine (NORVASC) 10 MG tablet; Take 1 tablet (10 mg total) by mouth daily.  Dispense: 30 tablet; Refill: 3 - hydrALAZINE (APRESOLINE) 25 MG tablet; Take 1 tablet (25 mg total) by mouth 3 (three) times daily.  Dispense: 90 tablet; Refill: 0 - lisinopril (ZESTRIL) 40 MG tablet; Take 1 tab ( 40 mg) daily  Dispense: 30 tablet; Refill: 3  3. Chronic right shoulder pain - He was advised to follow up with Orthopedic Surgeon Dr  D.G.Scott.   Follow-up: Return in about 8 weeks (around 09/30/2020), or if symptoms worsen or fail to improve.    Trashawn Oquendo Jerold Coombe, NP

## 2020-08-07 NOTE — Patient Instructions (Addendum)
DASH Eating Plan DASH stands for "Dietary Approaches to Stop Hypertension." The DASH eating plan is a healthy eating plan that has been shown to reduce high blood pressure (hypertension). It may also reduce your risk for type 2 diabetes, heart disease, and stroke. The DASH eating plan may also help with weight loss. What are tips for following this plan?  General guidelines  Avoid eating more than 2,300 mg (milligrams) of salt (sodium) a day. If you have hypertension, you may need to reduce your sodium intake to 1,500 mg a day.  Limit alcohol intake to no more than 1 drink a day for nonpregnant women and 2 drinks a day for men. One drink equals 12 oz of beer, 5 oz of wine, or 1 oz of hard liquor.  Work with your health care provider to maintain a healthy body weight or to lose weight. Ask what an ideal weight is for you.  Get at least 30 minutes of exercise that causes your heart to beat faster (aerobic exercise) most days of the week. Activities may include walking, swimming, or biking.  Work with your health care provider or diet and nutrition specialist (dietitian) to adjust your eating plan to your individual calorie needs. Reading food labels   Check food labels for the amount of sodium per serving. Choose foods with less than 5 percent of the Daily Value of sodium. Generally, foods with less than 300 mg of sodium per serving fit into this eating plan.  To find whole grains, look for the word "whole" as the first word in the ingredient list. Shopping  Buy products labeled as "low-sodium" or "no salt added."  Buy fresh foods. Avoid canned foods and premade or frozen meals. Cooking  Avoid adding salt when cooking. Use salt-free seasonings or herbs instead of table salt or sea salt. Check with your health care provider or pharmacist before using salt substitutes.  Do not fry foods. Cook foods using healthy methods such as baking, boiling, grilling, and broiling instead.  Cook with  heart-healthy oils, such as olive, canola, soybean, or sunflower oil. Meal planning  Eat a balanced diet that includes: ? 5 or more servings of fruits and vegetables each day. At each meal, try to fill half of your plate with fruits and vegetables. ? Up to 6-8 servings of whole grains each day. ? Less than 6 oz of lean meat, poultry, or fish each day. A 3-oz serving of meat is about the same size as a deck of cards. One egg equals 1 oz. ? 2 servings of low-fat dairy each day. ? A serving of nuts, seeds, or beans 5 times each week. ? Heart-healthy fats. Healthy fats called Omega-3 fatty acids are found in foods such as flaxseeds and coldwater fish, like sardines, salmon, and mackerel.  Limit how much you eat of the following: ? Canned or prepackaged foods. ? Food that is high in trans fat, such as fried foods. ? Food that is high in saturated fat, such as fatty meat. ? Sweets, desserts, sugary drinks, and other foods with added sugar. ? Full-fat dairy products.  Do not salt foods before eating.  Try to eat at least 2 vegetarian meals each week.  Eat more home-cooked food and less restaurant, buffet, and fast food.  When eating at a restaurant, ask that your food be prepared with less salt or no salt, if possible. What foods are recommended? The items listed may not be a complete list. Talk with your dietitian about   what dietary choices are best for you. Grains Whole-grain or whole-wheat bread. Whole-grain or whole-wheat pasta. Brown rice. Oatmeal. Quinoa. Bulgur. Whole-grain and low-sodium cereals. Pita bread. Low-fat, low-sodium crackers. Whole-wheat flour tortillas. Vegetables Fresh or frozen vegetables (raw, steamed, roasted, or grilled). Low-sodium or reduced-sodium tomato and vegetable juice. Low-sodium or reduced-sodium tomato sauce and tomato paste. Low-sodium or reduced-sodium canned vegetables. Fruits All fresh, dried, or frozen fruit. Canned fruit in natural juice (without  added sugar). Meat and other protein foods Skinless chicken or turkey. Ground chicken or turkey. Pork with fat trimmed off. Fish and seafood. Egg whites. Dried beans, peas, or lentils. Unsalted nuts, nut butters, and seeds. Unsalted canned beans. Lean cuts of beef with fat trimmed off. Low-sodium, lean deli meat. Dairy Low-fat (1%) or fat-free (skim) milk. Fat-free, low-fat, or reduced-fat cheeses. Nonfat, low-sodium ricotta or cottage cheese. Low-fat or nonfat yogurt. Low-fat, low-sodium cheese. Fats and oils Soft margarine without trans fats. Vegetable oil. Low-fat, reduced-fat, or light mayonnaise and salad dressings (reduced-sodium). Canola, safflower, olive, soybean, and sunflower oils. Avocado. Seasoning and other foods Herbs. Spices. Seasoning mixes without salt. Unsalted popcorn and pretzels. Fat-free sweets. What foods are not recommended? The items listed may not be a complete list. Talk with your dietitian about what dietary choices are best for you. Grains Baked goods made with fat, such as croissants, muffins, or some breads. Dry pasta or rice meal packs. Vegetables Creamed or fried vegetables. Vegetables in a cheese sauce. Regular canned vegetables (not low-sodium or reduced-sodium). Regular canned tomato sauce and paste (not low-sodium or reduced-sodium). Regular tomato and vegetable juice (not low-sodium or reduced-sodium). Pickles. Olives. Fruits Canned fruit in a light or heavy syrup. Fried fruit. Fruit in cream or butter sauce. Meat and other protein foods Fatty cuts of meat. Ribs. Fried meat. Bacon. Sausage. Bologna and other processed lunch meats. Salami. Fatback. Hotdogs. Bratwurst. Salted nuts and seeds. Canned beans with added salt. Canned or smoked fish. Whole eggs or egg yolks. Chicken or turkey with skin. Dairy Whole or 2% milk, cream, and half-and-half. Whole or full-fat cream cheese. Whole-fat or sweetened yogurt. Full-fat cheese. Nondairy creamers. Whipped toppings.  Processed cheese and cheese spreads. Fats and oils Butter. Stick margarine. Lard. Shortening. Ghee. Bacon fat. Tropical oils, such as coconut, palm kernel, or palm oil. Seasoning and other foods Salted popcorn and pretzels. Onion salt, garlic salt, seasoned salt, table salt, and sea salt. Worcestershire sauce. Tartar sauce. Barbecue sauce. Teriyaki sauce. Soy sauce, including reduced-sodium. Steak sauce. Canned and packaged gravies. Fish sauce. Oyster sauce. Cocktail sauce. Horseradish that you find on the shelf. Ketchup. Mustard. Meat flavorings and tenderizers. Bouillon cubes. Hot sauce and Tabasco sauce. Premade or packaged marinades. Premade or packaged taco seasonings. Relishes. Regular salad dressings. Where to find more information:  National Heart, Lung, and Blood Institute: www.nhlbi.nih.gov  American Heart Association: www.heart.org Summary  The DASH eating plan is a healthy eating plan that has been shown to reduce high blood pressure (hypertension). It may also reduce your risk for type 2 diabetes, heart disease, and stroke.  With the DASH eating plan, you should limit salt (sodium) intake to 2,300 mg a day. If you have hypertension, you may need to reduce your sodium intake to 1,500 mg a day.  When on the DASH eating plan, aim to eat more fresh fruits and vegetables, whole grains, lean proteins, low-fat dairy, and heart-healthy fats.  Work with your health care provider or diet and nutrition specialist (dietitian) to adjust your eating plan to your   individual calorie needs. This information is not intended to replace advice given to you by your health care provider. Make sure you discuss any questions you have with your health care provider. Document Revised: 11/11/2017 Document Reviewed: 11/22/2016 Elsevier Patient Education  2020 Elsevier Inc. Carbohydrate Counting for Diabetes Mellitus, Adult  Carbohydrate counting is a method of keeping track of how many carbohydrates you  eat. Eating carbohydrates naturally increases the amount of sugar (glucose) in the blood. Counting how many carbohydrates you eat helps keep your blood glucose within normal limits, which helps you manage your diabetes (diabetes mellitus). It is important to know how many carbohydrates you can safely have in each meal. This is different for every person. A diet and nutrition specialist (registered dietitian) can help you make a meal plan and calculate how many carbohydrates you should have at each meal and snack. Carbohydrates are found in the following foods:  Grains, such as breads and cereals.  Dried beans and soy products.  Starchy vegetables, such as potatoes, peas, and corn.  Fruit and fruit juices.  Milk and yogurt.  Sweets and snack foods, such as cake, cookies, candy, chips, and soft drinks. How do I count carbohydrates? There are two ways to count carbohydrates in food. You can use either of the methods or a combination of both. Reading "Nutrition Facts" on packaged food The "Nutrition Facts" list is included on the labels of almost all packaged foods and beverages in the [place] It includes:  The serving size.  Information about nutrients in each serving, including the grams (g) of carbohydrate per serving. To use the "Nutrition Facts":  Decide how many servings you will have.  Multiply the number of servings by the number of carbohydrates per serving.  The resulting number is the total amount of carbohydrates that you will be having. Learning standard serving sizes of other foods When you eat carbohydrate foods that are not packaged or do not include "Nutrition Facts" on the label, you need to measure the servings in order to count the amount of carbohydrates:  Measure the foods that you will eat with a food scale or measuring cup, if needed.  Decide how many standard-size servings you will eat.  Multiply the number of servings by 15. Most carbohydrate-rich foods have  about 15 g of carbohydrates per serving. ? For example, if you eat 8 oz (170 g) of strawberries, you will have eaten 2 servings and 30 g of carbohydrates (2 servings x 15 g = 30 g).  For foods that have more than one food mixed, such as soups and casseroles, you must count the carbohydrates in each food that is included. The following list contains standard serving sizes of common carbohydrate-rich foods. Each of these servings has about 15 g of carbohydrates:   hamburger bun or  English muffin.   oz (15 mL) syrup.   oz (14 g) jelly.  1 slice of bread.  1 six-inch tortilla.  3 oz (85 g) cooked rice or pasta.  4 oz (113 g) cooked dried beans.  4 oz (113 g) starchy vegetable, such as peas, corn, or potatoes.  4 oz (113 g) hot cereal.  4 oz (113 g) mashed potatoes or  of a large baked potato.  4 oz (113 g) canned or frozen fruit.  4 oz (120 mL) fruit juice.  4-6 crackers.  6 chicken nuggets.  6 oz (170 g) unsweetened dry cereal.  6 oz (170 g) plain fat-free yogurt or yogurt sweetened with   artificial sweeteners.  8 oz (240 mL) milk.  8 oz (170 g) fresh fruit or one small piece of fruit.  24 oz (680 g) popped popcorn. Example of carbohydrate counting Sample meal  3 oz (85 g) chicken breast.  6 oz (170 g) brown rice.  4 oz (113 g) corn.  8 oz (240 mL) milk.  8 oz (170 g) strawberries with sugar-free whipped topping. Carbohydrate calculation 1. Identify the foods that contain carbohydrates: ? Rice. ? Corn. ? Milk. ? Strawberries. 2. Calculate how many servings you have of each food: ? 2 servings rice. ? 1 serving corn. ? 1 serving milk. ? 1 serving strawberries. 3. Multiply each number of servings by 15 g: ? 2 servings rice x 15 g = 30 g. ? 1 serving corn x 15 g = 15 g. ? 1 serving milk x 15 g = 15 g. ? 1 serving strawberries x 15 g = 15 g. 4. Add together all of the amounts to find the total grams of carbohydrates eaten: ? 30 g + 15 g + 15 g + 15  g = 75 g of carbohydrates total. Summary  Carbohydrate counting is a method of keeping track of how many carbohydrates you eat.  Eating carbohydrates naturally increases the amount of sugar (glucose) in the blood.  Counting how many carbohydrates you eat helps keep your blood glucose within normal limits, which helps you manage your diabetes.  A diet and nutrition specialist (registered dietitian) can help you make a meal plan and calculate how many carbohydrates you should have at each meal and snack. This information is not intended to replace advice given to you by your health care provider. Make sure you discuss any questions you have with your health care provider. Document Revised: 06/23/2017 Document Reviewed: 05/12/2016 Elsevier Patient Education  2020 Elsevier Inc.  

## 2020-08-07 NOTE — Telephone Encounter (Signed)
Please advise 

## 2020-08-08 NOTE — Telephone Encounter (Signed)
Attempted to call patient to inform him of the results.  Voicemail was not set up.  Will try again on Monday

## 2020-08-11 ENCOUNTER — Other Ambulatory Visit: Payer: Self-pay | Admitting: Surgical

## 2020-08-11 MED ORDER — ACETAMINOPHEN-CODEINE #3 300-30 MG PO TABS
1.0000 | ORAL_TABLET | Freq: Two times a day (BID) | ORAL | 0 refills | Status: DC | PRN
Start: 2020-08-11 — End: 2020-08-29

## 2020-08-11 NOTE — Telephone Encounter (Signed)
Called and discussed.  Thanks for reminded

## 2020-08-11 NOTE — Telephone Encounter (Signed)
Don't forget to call her again today. To give her MRI results. Thank you.

## 2020-08-12 ENCOUNTER — Ambulatory Visit: Payer: Self-pay | Admitting: Gerontology

## 2020-08-12 ENCOUNTER — Other Ambulatory Visit: Payer: Self-pay

## 2020-08-12 ENCOUNTER — Other Ambulatory Visit: Payer: Self-pay | Admitting: Gerontology

## 2020-08-12 DIAGNOSIS — E119 Type 2 diabetes mellitus without complications: Secondary | ICD-10-CM

## 2020-08-12 MED ORDER — GABAPENTIN 400 MG PO CAPS: 400.0000 mg | ORAL_CAPSULE | Freq: Three times a day (TID) | ORAL | 3 refills | Status: DC

## 2020-08-26 ENCOUNTER — Other Ambulatory Visit: Payer: Self-pay | Admitting: Gerontology

## 2020-08-27 ENCOUNTER — Telehealth: Payer: Self-pay | Admitting: Surgical

## 2020-08-27 NOTE — Telephone Encounter (Signed)
See below. I called and talked with patient. He states his shoulder is much worse. States ROM is limited and he is having severe pain. States he is having severe pain and is wanting to know if you can help him out with something for pain?  States he is hoping to have surgery sometime later this year but has been delayed due to COVID. Asking for a prescription for pain, specifically something a little stronger than tylenol with codeine? States previous prescription of T#3 was not very helpful relieving his pain.

## 2020-08-27 NOTE — Telephone Encounter (Signed)
Patient called requesting PA Franky Macho to call him right away. Patient did not disclose the nature of his call. Only that it is very important. Patient phone number is [phone].

## 2020-08-28 NOTE — Telephone Encounter (Signed)
There is a possibility of proceeding with surgery if patient feels that he could do okay with going home the same day after surgery.  As for pain control, don't want to do anything stronger than Tylenol #3 and I dont want to do anything that he relies on every day as this will make his postop pain worse if he takes narcotics regularly leading up to procedure.

## 2020-08-29 ENCOUNTER — Other Ambulatory Visit: Payer: Self-pay | Admitting: Surgical

## 2020-08-29 NOTE — Telephone Encounter (Signed)
I spoke with patient and advised per below. He would like T#3 sent to his pharmacy if possible.

## 2020-08-29 NOTE — Telephone Encounter (Signed)
Looks good

## 2020-08-29 NOTE — Telephone Encounter (Signed)
Post for same day surgery? Sent in Tylenol 3

## 2020-09-02 ENCOUNTER — Other Ambulatory Visit: Payer: Self-pay | Admitting: Surgical

## 2020-09-02 MED ORDER — ACETAMINOPHEN-CODEINE #3 300-30 MG PO TABS
1.0000 | ORAL_TABLET | Freq: Two times a day (BID) | ORAL | 0 refills | Status: DC | PRN
Start: 2020-09-02 — End: 2020-09-16

## 2020-09-02 NOTE — Telephone Encounter (Signed)
Can you fill out blue sheet for him tomorrow? thanks

## 2020-09-02 NOTE — Telephone Encounter (Signed)
Please advise. Thanks.  

## 2020-09-03 NOTE — Telephone Encounter (Signed)
Please make sure to complete blue sheet. Thanks.

## 2020-09-04 NOTE — Telephone Encounter (Signed)
done

## 2020-09-07 ENCOUNTER — Other Ambulatory Visit: Payer: Self-pay | Admitting: Surgical

## 2020-09-09 NOTE — Progress Notes (Signed)
Your procedure is scheduled on Thursday, October 7th.  Report to [facility] Main Entrance "A" at 10:30 A.M., and check in at the Admitting office.  Call this number if you have problems the morning of surgery:  [phone]  Call [phone] if you have any questions prior to your surgery date Monday-Friday 8am-4pm   Remember:  Do not eat after midnight the night before your surgery  You may drink clear liquids until 9:30 A.M. the morning of your surgery.   Clear liquids allowed are: Water, Non-Citrus Juices (without pulp), Carbonated Beverages, Clear Tea, Black Coffee Only, and Gatorade    Enhanced Recovery after Surgery for Orthopedics Enhanced Recovery after Surgery is a protocol used to improve the stress on your body and your recovery after surgery.  Patient Instructions  . The night before surgery:  o No food after midnight. ONLY clear liquids after midnight  . The day of surgery (if you have diabetes): o  o Drink ONE (1) Gatorade 2 (G2) by 9:30 A.M. the morning of surgery. This drink was given to you during your hospital  pre-op appointment visit.  o Color of the Gatorade may vary. Red is not allowed. o Nothing else to drink after completing the  Gatorade 2 (G2).         If you have questions, please contact your surgeon's office.   Take these medicines the morning of surgery with A SIP OF WATER  amLODipine (NORVASC) gabapentin (NEURONTIN)  hydrALAZINE (APRESOLINE)   If needed: acetaminophen-codeine (TYLENOL #3)  albuterol (VENTOLIN HFA)/inhaler- bring with you the day of surgery  As of today, STOP taking any Aspirin (unless otherwise instructed by your surgeon) Aleve, Naproxen, Ibuprofen, Motrin, Advil, Goody's, BC's, all herbal medications, fish oil, and all vitamins.    WHAT DO I DO ABOUT MY DIABETES MEDICATION?  ---The morning of surgery--- Do NOT take glipiZIDE (GLUCOTROL), metFORMIN (GLUCOPHAGE), or pioglitazone (ACTOS)     HOW TO MANAGE YOUR  DIABETES BEFORE AND AFTER SURGERY  Why is it important to control my blood sugar before and after surgery? . Improving blood sugar levels before and after surgery helps healing and can limit problems. . A way of improving blood sugar control is eating a healthy diet by: o  Eating less sugar and carbohydrates o  Increasing activity/exercise o  Talking with your doctor about reaching your blood sugar goals . High blood sugars (greater than 180 mg/dL) can raise your risk of infections and slow your recovery, so you will need to focus on controlling your diabetes during the weeks before surgery. . Make sure that the doctor who takes care of your diabetes knows about your planned surgery including the date and location.  How do I manage my blood sugar before surgery? . Check your blood sugar at least 4 times a day, starting 2 days before surgery, to make sure that the level is not too high or low. . Check your blood sugar the morning of your surgery when you wake up and every 2 hours until you get to the Short Stay unit. o If your blood sugar is less than 70 mg/dL, you will need to treat for low blood sugar: - Do not take insulin. - Treat a low blood sugar (less than 70 mg/dL) with  cup of clear juice (cranberry or apple), 4 glucose tablets, OR glucose gel. - Recheck blood sugar in 15 minutes after treatment (to make sure it is greater than 70 mg/dL). If your blood sugar is not greater  than 70 mg/dL on recheck, call [phone] for further instructions. . Report your blood sugar to the short stay nurse when you get to Short Stay.  . If you are admitted to the hospital after surgery: o Your blood sugar will be checked by the staff and you will probably be given insulin after surgery (instead of oral diabetes medicines) to make sure you have good blood sugar levels. o The goal for blood sugar control after surgery is 80-180 mg/dL.             Do not wear jewelry.            Do not wear lotions,  powders, olognes, or deodorant.            Men may shave face and neck.            Do not bring valuables to the hospital.            [facility] is not responsible for any belongings or valuables.  Do NOT Smoke (Tobacco/Vaping) or drink Alcohol 24 hours prior to your procedure If you use a CPAP at night, you may bring all equipment for your overnight stay.   Contacts, glasses, dentures or bridgework may not be worn into surgery.      For patients admitted to the hospital, discharge time will be determined by your treatment team.   Patients discharged the day of surgery will not be allowed to drive home, and someone needs to stay with them for 24 hours.  Special instructions:   [facility]- Preparing For Surgery  Before surgery, you can play an important role. Because skin is not sterile, your skin needs to be as free of germs as possible. You can reduce the number of germs on your skin by washing with CHG (chlorahexidine gluconate) Soap before surgery.  CHG is an antiseptic cleaner which kills germs and bonds with the skin to continue killing germs even after washing.    Oral Hygiene is also important to reduce your risk of infection.  Remember - BRUSH YOUR TEETH THE MORNING OF SURGERY WITH YOUR REGULAR TOOTHPASTE  Please do not use if you have an allergy to CHG or antibacterial soaps. If your skin becomes reddened/irritated stop using the CHG.  Do not shave (including legs and underarms) for at least 48 hours prior to first CHG shower. It is OK to shave your face.  Please follow these instructions carefully.   1. Shower the NIGHT BEFORE SURGERY and the MORNING OF SURGERY with CHG Soap.   2. If you chose to wash your hair, wash your hair first as usual with your normal shampoo.  3. After you shampoo, rinse your hair and body thoroughly to remove the shampoo.  4. Use CHG as you would any other liquid soap. You can apply CHG directly to the skin and wash gently with a scrungie or a  clean washcloth.   5. Apply the CHG Soap to your body ONLY FROM THE NECK DOWN.  Do not use on open wounds or open sores. Avoid contact with your eyes, ears, mouth and genitals (private parts). Wash Face and genitals (private parts)  with your normal soap.   6. Wash thoroughly, paying special attention to the area where your surgery will be performed.  7. Thoroughly rinse your body with warm water from the neck down.  8. DO NOT shower/wash with your normal soap after using and rinsing off the CHG Soap.  9. Pat yourself dry  with a CLEAN TOWEL.  10. Wear CLEAN PAJAMAS to bed the night before surgery  11. Place CLEAN SHEETS on your bed the night of your first shower and DO NOT SLEEP WITH PETS.  Day of Surgery: Wear Clean/Comfortable clothing the morning of surgery Do not apply any deodorants/lotions.   Remember to brush your teeth WITH YOUR REGULAR TOOTHPASTE.   Please read over the following fact sheets that you were given.

## 2020-09-10 ENCOUNTER — Encounter (HOSPITAL_COMMUNITY): Payer: Self-pay

## 2020-09-10 ENCOUNTER — Other Ambulatory Visit: Payer: Self-pay

## 2020-09-10 ENCOUNTER — Encounter (HOSPITAL_COMMUNITY)
Admission: RE | Admit: 2020-09-10 | Discharge: 2020-09-10 | Disposition: A | Discharge status: Home / Self Care | Payer: Self-pay | Source: Ambulatory Visit | Attending: Orthopedic Surgery | Admitting: Orthopedic Surgery

## 2020-09-10 DIAGNOSIS — E1142 Type 2 diabetes mellitus with diabetic polyneuropathy: Secondary | ICD-10-CM | POA: Insufficient documentation

## 2020-09-10 DIAGNOSIS — I10 Essential (primary) hypertension: Secondary | ICD-10-CM | POA: Insufficient documentation

## 2020-09-10 DIAGNOSIS — E785 Hyperlipidemia, unspecified: Secondary | ICD-10-CM | POA: Insufficient documentation

## 2020-09-10 DIAGNOSIS — Z01818 Encounter for other preprocedural examination: Secondary | ICD-10-CM | POA: Insufficient documentation

## 2020-09-10 HISTORY — DX: Headache, unspecified: R51.9

## 2020-09-10 HISTORY — DX: Myoneural disorder, unspecified: G70.9

## 2020-09-10 LAB — COMPREHENSIVE METABOLIC PANEL
ALT: 39 U/L (ref 0–44)
AST: 30 U/L (ref 15–41)
Albumin: 4.2 g/dL (ref 3.5–5.0)
Alkaline Phosphatase: 48 U/L (ref 38–126)
Anion gap: 10 (ref 5–15)
BUN: 18 mg/dL (ref 6–20)
CO2: 22 mmol/L (ref 22–32)
Calcium: 9.9 mg/dL (ref 8.9–10.3)
Chloride: 103 mmol/L (ref 98–111)
Creatinine, Ser: 0.8 mg/dL (ref 0.61–1.24)
GFR calc Af Amer: >60 mL/min (ref >60)
GFR calc non Af Amer: >60 mL/min (ref >60)
Glucose, Bld: 127 mg/dL — ABNORMAL HIGH (ref 70–99)
Potassium: 4.4 mmol/L (ref 3.5–5.1)
Sodium: 135 mmol/L (ref 135–145)
Total Bilirubin: 0.5 mg/dL (ref 0.3–1.2)
Total Protein: 7.4 g/dL (ref 6.5–8.1)

## 2020-09-10 LAB — URINALYSIS, ROUTINE W REFLEX MICROSCOPIC
Bilirubin Urine: NEGATIVE
Glucose, UA: NEGATIVE mg/dL
Hgb urine dipstick: NEGATIVE
Ketones, ur: NEGATIVE mg/dL
Leukocytes,Ua: NEGATIVE
Nitrite: NEGATIVE
Protein, ur: NEGATIVE mg/dL
Specific Gravity, Urine: 1.011 (ref 1.005–1.030)
pH: 5 (ref 5.0–8.0)

## 2020-09-10 LAB — CBC
HCT: 47.8 % (ref 39.0–52.0)
Hemoglobin: 15.8 g/dL (ref 13.0–17.0)
MCH: 30.3 pg (ref 26.0–34.0)
MCHC: 33.1 g/dL (ref 30.0–36.0)
MCV: 91.6 fL (ref 80.0–100.0)
Platelets: 303 10*3/uL (ref 150–400)
RBC: 5.22 MIL/uL (ref 4.22–5.81)
RDW: 12.8 % (ref 11.5–15.5)
WBC: 12.1 10*3/uL — ABNORMAL HIGH (ref 4.0–10.5)
nRBC: 0 % (ref 0.0–0.2)

## 2020-09-10 LAB — SURGICAL PCR SCREEN
MRSA, PCR: NEGATIVE
Staphylococcus aureus: NEGATIVE

## 2020-09-10 LAB — HEMOGLOBIN A1C
Hgb A1c MFr Bld: 7.2 % — ABNORMAL HIGH (ref 4.8–5.6)
Mean Plasma Glucose: 159.94 mg/dL

## 2020-09-10 LAB — GLUCOSE, CAPILLARY: Glucose-Capillary: 146 mg/dL — ABNORMAL HIGH (ref 70–99)

## 2020-09-10 NOTE — Progress Notes (Addendum)
Your procedure is scheduled on Thursday, October 7th, from 12:30 pm- 4:06 PM.  Report to [facility] Main Entrance "A" at 10:30 A.M., and check in at the Admitting office.  Call this number if you have problems the morning of surgery:  [phone]  Call [phone] if you have any questions prior to your surgery date Monday-Friday 8am-4pm.   Remember:  Do not eat after midnight the night before your surgery.  You may drink clear liquids until 9:30 A.M. the morning of your surgery.   Clear liquids allowed are: Water, Non-Citrus Juices (without pulp), Carbonated Beverages, Clear Tea, Black Coffee Only, and Gatorade    Enhanced Recovery after Surgery for Orthopedics Enhanced Recovery after Surgery is a protocol used to improve the stress on your body and your recovery after surgery.  Patient Instructions  . The night before surgery:  o No food after midnight. ONLY clear liquids after midnight  . The day of surgery (if you have diabetes): o  o Drink ONE (1) Gatorade 2 (G2) by 9:30 A.M. the morning of surgery.  o This drink was given to you during your hospital pre-op appointment visit.  o Color of the Gatorade may vary. Red is not allowed. o Nothing else to drink after completing the  Gatorade 2 (G2).         If you have questions, please contact your surgeon's office.   Take these medicines the morning of surgery with A SIP OF WATER:  amLODipine (NORVASC) gabapentin (NEURONTIN)  hydrALAZINE (APRESOLINE)   If needed:      acetaminophen-codeine (TYLENOL #3)  albuterol (VENTOLIN HFA) inhaler- bring with you the day of surgery  As of today, STOP taking any Aspirin (unless otherwise instructed by your surgeon) Aleve, Naproxen, Ibuprofen, Motrin, Advil, Goody's, BC's, all herbal medications, fish oil, and all vitamins.     WHAT DO I DO ABOUT MY DIABETES MEDICATION?  ---THE EVENING BEFORE SURGERY--- Do NOT take glipiZIDE (GLUCOTROL)  ---THE MORNING OF SURGERY--- Do NOT take  glipiZIDE (GLUCOTROL), metFORMIN (GLUCOPHAGE), or pioglitazone (ACTOS)   HOW TO MANAGE YOUR DIABETES BEFORE AND AFTER SURGERY  Why is it important to control my blood sugar before and after surgery? . Improving blood sugar levels before and after surgery helps healing and can limit problems. . A way of improving blood sugar control is eating a healthy diet by: o  Eating less sugar and carbohydrates o  Increasing activity/exercise o  Talking with your doctor about reaching your blood sugar goals . High blood sugars (greater than 180 mg/dL) can raise your risk of infections and slow your recovery, so you will need to focus on controlling your diabetes during the weeks before surgery. . Make sure that the doctor who takes care of your diabetes knows about your planned surgery including the date and location.  How do I manage my blood sugar before surgery? . Check your blood sugar at least 4 times a day, starting 2 days before surgery, to make sure that the level is not too high or low. . Check your blood sugar the morning of your surgery when you wake up and every 2 hours until you get to the Short Stay unit. o If your blood sugar is less than 70 mg/dL, you will need to treat for low blood sugar: - Do not take insulin. - Treat a low blood sugar (less than 70 mg/dL) with  cup of clear juice (cranberry or apple), 4 glucose tablets, OR glucose gel. - Recheck blood sugar  in 15 minutes after treatment (to make sure it is greater than 70 mg/dL). If your blood sugar is not greater than 70 mg/dL on recheck, call [phone] for further instructions. . Report your blood sugar to the short stay nurse when you get to Short Stay.  . If you are admitted to the hospital after surgery: o Your blood sugar will be checked by the staff and you will probably be given insulin after surgery (instead of oral diabetes medicines) to make sure you have good blood sugar levels. o The goal for blood sugar control  after surgery is 80-180 mg/dL.   The Morning of Surgery:            Do not wear jewelry.            Do not wear lotions, powders, Colognes, or deodorant.            Men may shave face and neck.            Do not bring valuables to the hospital.            [facility] is not responsible for any belongings or valuables.  Do NOT Smoke (Tobacco/Vaping) or drink Alcohol 24 hours prior to your procedure.  If you use a CPAP at night, you may bring all equipment for your overnight stay.   Contacts, glasses, dentures or bridgework may not be worn into surgery.      For patients admitted to the hospital, discharge time will be determined by your treatment team.   Patients discharged the day of surgery will not be allowed to drive home, and someone needs to stay with them for 24 hours.                                    [facility]- Preparing for Total Shoulder Arthroplasty   Before surgery, you can play an important role. Because skin is not sterile, your skin needs to be as free of germs as possible. You can reduce the number of germs on your skin by using the following products. . Benzoyl Peroxide Gel o Reduces the number of germs present on the skin o Applied twice a day to shoulder area starting two days before surgery   . Chlorhexidine Gluconate (CHG) Soap o An antiseptic cleaner that kills germs and bonds with the skin to continue killing germs even after washing o Used for showering the night before surgery and morning of surgery   Oral Hygiene is also important to reduce your risk of infection.                                    Remember - BRUSH YOUR TEETH THE MORNING OF SURGERY WITH YOUR REGULAR TOOTHPASTE  ==================================================================  Please follow these instructions carefully:  BENZOYL PEROXIDE 5% GEL  Please do not use if you have an allergy to benzoyl peroxide.   If your skin becomes reddened/irritated stop using the benzoyl  peroxide.  Starting two days before surgery, apply as follows: 1. Apply benzoyl peroxide in the morning and at night. Apply after taking a shower. If you are not taking a shower clean entire shoulder front, back, and side along with the armpit with a clean wet washcloth.  2. Place a quarter-sized dollop on your shoulder and rub in thoroughly, making sure to  cover the front, back, and side of your shoulder, along with the armpit.   2 days before ____ AM   ____ PM              1 day before ____ AM   ____ PM                            3.  Do this twice a day for two days.  (Last application is the night before surgery, AFTER using the CHG soap as described below).  4. Do NOT apply benzoyl peroxide gel on the day of surgery.  CHLORHEXIDINE GLUCONATE (CHG) SOAP  Please do not use if you have an allergy to CHG or antibacterial soaps. If your skin becomes reddened/irritated stop using the CHG.   Do not shave (including legs and underarms) for at least 48 hours prior to first CHG shower. It is OK to shave your face.  Starting the night before surgery, use CHG soap as follows:  1. Shower the NIGHT BEFORE SURGERY and MORNING OF SURGERY with CHG.  2. If you choose to wash your hair, wash your hair first as usual with your normal shampoo.  3. After shampooing, rinse your hair and body thoroughly to remove the shampoo.  4. Use CHG as you would any other liquid soap.  You can apply CHG directly to the skin and wash gently with a scrungie or a clean washcloth.  5. Apply the CHG soap to your body ONLY FROM THE NECK DOWN.  Do not use on open wounds or open sores.  Avoid contact with your eyes, ears, mouth, and genitals (private parts).  Wash face and genitals (private parts) with your normal soap.  6. Wash thoroughly, paying special attention to the area where your surgery will be performed.  7. Thoroughly rinse your body with warm water from the neck down.  8. DO NOT shower/wash with your  normal soap after using and rinsing off the CHG soap.   9. Pat yourself dry with a CLEAN TOWEL.   10.  Apply benzoyl peroxide.   11. Wear CLEAN PAJAMAS to bed the night before surgery; wear comfortable clothes the morning of surgery.  12. Place CLEAN SHEETS on your bed the night of your first shower and DO NOT SLEEP WITH PETS.  Day of Surgery: Shower as above Do not apply any deodorants/lotions.  Please wear clean clothes to the hospital/surgery center.   Remember to brush your teeth WITH YOUR REGULAR TOOTHPASTE.

## 2020-09-10 NOTE — Progress Notes (Addendum)
PCP - Chioma E. Iloabachie, NP Cardiologist - Denies  PPM/ICD - Denies  Chest x-ray - N/A EKG - 09/10/20 Stress Test - Denies ECHO - Denies Cardiac Cath - Denies  Sleep Study - Denies  Fasting Blood Sugar: 160-225 Checks Blood Sugar X1 weekly. A1C obtained.  Blood Thinner Instructions: N/A Aspirin Instructions: N/A  ERAS Protcol - Yes PRE-SURGERY Ensure or G2- G2 given  COVID TEST- 09/15/20   Anesthesia review: Yes, abnormal EKG.  Patient denies shortness of breath, fever, cough and chest pain at PAT appointment   All instructions explained to the patient, with a verbal understanding of the material. Patient agrees to go over the instructions while at home for a better understanding. Patient also instructed to self quarantine after being tested for COVID-19. The opportunity to ask questions was provided.

## 2020-09-11 LAB — URINE CULTURE: Culture: <10000 — AB

## 2020-09-12 ENCOUNTER — Telehealth: Payer: Self-pay | Admitting: Orthopedic Surgery

## 2020-09-15 ENCOUNTER — Other Ambulatory Visit (HOSPITAL_COMMUNITY)
Admission: RE | Admit: 2020-09-15 | Discharge: 2020-09-15 | Disposition: A | Discharge status: Home / Self Care | Payer: Self-pay | Source: Ambulatory Visit | Attending: Orthopedic Surgery | Admitting: Orthopedic Surgery

## 2020-09-15 DIAGNOSIS — Z20822 Contact with and (suspected) exposure to covid-19: Secondary | ICD-10-CM | POA: Insufficient documentation

## 2020-09-15 DIAGNOSIS — Z01812 Encounter for preprocedural laboratory examination: Secondary | ICD-10-CM | POA: Insufficient documentation

## 2020-09-15 LAB — SARS CORONAVIRUS 2 (TAT 6-24 HRS): SARS Coronavirus 2: NEGATIVE

## 2020-09-16 ENCOUNTER — Encounter: Payer: Self-pay | Admitting: Gerontology

## 2020-09-16 ENCOUNTER — Ambulatory Visit: Payer: Self-pay | Admitting: Gerontology

## 2020-09-16 ENCOUNTER — Other Ambulatory Visit: Payer: Self-pay

## 2020-09-16 VITALS — BP 144/91 | HR 85 | Wt 212.5 lb

## 2020-09-16 DIAGNOSIS — Z01818 Encounter for other preprocedural examination: Secondary | ICD-10-CM

## 2020-09-16 DIAGNOSIS — R9431 Abnormal electrocardiogram [ECG] [EKG]: Secondary | ICD-10-CM | POA: Insufficient documentation

## 2020-09-16 NOTE — Patient Instructions (Signed)
Carbohydrate Counting for Diabetes Mellitus, Adult  Carbohydrate counting is a method of keeping track of how many carbohydrates you eat. Eating carbohydrates naturally increases the amount of sugar (glucose) in the blood. Counting how many carbohydrates you eat helps keep your blood glucose within normal limits, which helps you manage your diabetes (diabetes mellitus). It is important to know how many carbohydrates you can safely have in each meal. This is different for every person. A diet and nutrition specialist (registered dietitian) can help you make a meal plan and calculate how many carbohydrates you should have at each meal and snack. Carbohydrates are found in the following foods:  Grains, such as breads and cereals.  Dried beans and soy products.  Starchy vegetables, such as potatoes, peas, and corn.  Fruit and fruit juices.  Milk and yogurt.  Sweets and snack foods, such as cake, cookies, candy, chips, and soft drinks. How do I count carbohydrates? There are two ways to count carbohydrates in food. You can use either of the methods or a combination of both. Reading "Nutrition Facts" on packaged food The "Nutrition Facts" list is included on the labels of almost all packaged foods and beverages in the [place] It includes:  The serving size.  Information about nutrients in each serving, including the grams (g) of carbohydrate per serving. To use the "Nutrition Facts":  Decide how many servings you will have.  Multiply the number of servings by the number of carbohydrates per serving.  The resulting number is the total amount of carbohydrates that you will be having. Learning standard serving sizes of other foods When you eat carbohydrate foods that are not packaged or do not include "Nutrition Facts" on the label, you need to measure the servings in order to count the amount of carbohydrates:  Measure the foods that you will eat with a food scale or measuring cup, if  needed.  Decide how many standard-size servings you will eat.  Multiply the number of servings by 15. Most carbohydrate-rich foods have about 15 g of carbohydrates per serving. ? For example, if you eat 8 oz (170 g) of strawberries, you will have eaten 2 servings and 30 g of carbohydrates (2 servings x 15 g = 30 g).  For foods that have more than one food mixed, such as soups and casseroles, you must count the carbohydrates in each food that is included. The following list contains standard serving sizes of common carbohydrate-rich foods. Each of these servings has about 15 g of carbohydrates:   hamburger bun or  English muffin.   oz (15 mL) syrup.   oz (14 g) jelly.  1 slice of bread.  1 six-inch tortilla.  3 oz (85 g) cooked rice or pasta.  4 oz (113 g) cooked dried beans.  4 oz (113 g) starchy vegetable, such as peas, corn, or potatoes.  4 oz (113 g) hot cereal.  4 oz (113 g) mashed potatoes or  of a large baked potato.  4 oz (113 g) canned or frozen fruit.  4 oz (120 mL) fruit juice.  4-6 crackers.  6 chicken nuggets.  6 oz (170 g) unsweetened dry cereal.  6 oz (170 g) plain fat-free yogurt or yogurt sweetened with artificial sweeteners.  8 oz (240 mL) milk.  8 oz (170 g) fresh fruit or one small piece of fruit.  24 oz (680 g) popped popcorn. Example of carbohydrate counting Sample meal  3 oz (85 g) chicken breast.  6 oz (170 g)   brown rice.  4 oz (113 g) corn.  8 oz (240 mL) milk.  8 oz (170 g) strawberries with sugar-free whipped topping. Carbohydrate calculation 1. Identify the foods that contain carbohydrates: ? Rice. ? Corn. ? Milk. ? Strawberries. 2. Calculate how many servings you have of each food: ? 2 servings rice. ? 1 serving corn. ? 1 serving milk. ? 1 serving strawberries. 3. Multiply each number of servings by 15 g: ? 2 servings rice x 15 g = 30 g. ? 1 serving corn x 15 g = 15 g. ? 1 serving milk x 15 g = 15 g. ? 1  serving strawberries x 15 g = 15 g. 4. Add together all of the amounts to find the total grams of carbohydrates eaten: ? 30 g + 15 g + 15 g + 15 g = 75 g of carbohydrates total. Summary  Carbohydrate counting is a method of keeping track of how many carbohydrates you eat.  Eating carbohydrates naturally increases the amount of sugar (glucose) in the blood.  Counting how many carbohydrates you eat helps keep your blood glucose within normal limits, which helps you manage your diabetes.  A diet and nutrition specialist (registered dietitian) can help you make a meal plan and calculate how many carbohydrates you should have at each meal and snack. This information is not intended to replace advice given to you by your health care provider. Make sure you discuss any questions you have with your health care provider. Document Revised: 06/23/2017 Document Reviewed: 05/12/2016 Elsevier Patient Education  2020 [employer]  

## 2020-09-16 NOTE — Anesthesia Preprocedure Evaluation (Addendum)
Anesthesia Evaluation  Patient identified by MRN, date of birth, ID band Patient awake    Reviewed: Allergy & Precautions, NPO status , Patient's Chart, lab work & pertinent test results  Airway Mallampati: II  TM Distance: >3 FB Neck ROM: Full    Dental  (+) Poor Dentition, Dental Advisory Given,    Pulmonary Current Smoker and Patient abstained from smoking.,  7-8 cigg/d, cut down from 2ppd Uses albuterol daily, last used last night   Pulmonary exam normal breath sounds clear to auscultation       Cardiovascular hypertension, Pt. on medications Normal cardiovascular exam Rhythm:Regular Rate:Normal     Neuro/Psych  Headaches, PSYCHIATRIC DISORDERS Anxiety    GI/Hepatic negative GI ROS, (+)     substance abuse  alcohol use and marijuana use,   Endo/Other  diabetes, Well Controlled, Type 2, Oral Hypoglycemic AgentsObesity BMI 33 a1c 7.2  Renal/GU negative Renal ROS  negative genitourinary   Musculoskeletal Right shoulder rotator cuff tear    Abdominal (+) + obese,   Peds  Hematology negative hematology ROS (+) hct 47.8   Anesthesia Other Findings   Reproductive/Obstetrics negative OB ROS                           Anesthesia Physical Anesthesia Plan  ASA: III  Anesthesia Plan: General and Regional   Post-op Pain Management: GA combined w/ Regional for post-op pain   Induction: Intravenous  PONV Risk Score and Plan: Ondansetron, Dexamethasone, Treatment may vary due to age or medical condition and Midazolam  Airway Management Planned: Oral ETT  Additional Equipment: None  Intra-op Plan:   Post-operative Plan: Extubation in OR  Informed Consent: I have reviewed the patients History and Physical, chart, labs and discussed the procedure including the risks, benefits and alternatives for the proposed anesthesia with the patient or authorized representative who has indicated his/her  understanding and acceptance.     Dental advisory given  Plan Discussed with: CRNA  Anesthesia Plan Comments:       Anesthesia Quick Evaluation

## 2020-09-16 NOTE — Progress Notes (Signed)
Anesthesia Chart Review:  Pertinent history includes Hypertension, Hyperlipidemia, DMII with Peripheral neuropathy.  He was seen by his PCP Eulogio Bear, NP on 09/16/2020 and cleared for surgery.  Preop labs reviewed, DM2 reasonably well-controlled with A1c 7.2, otherwise unremarkable.  EKG 09/10/2020: Normal sinus rhythm.  Rate 74. Right superior axis deviation. Right ventricular hypertrophy.  Appears unchanged from tracing 08/28/2019.   [facility] Short Stay Center/Anesthesiology Phone [phone] 09/16/2020 3:43 PM

## 2020-09-16 NOTE — Progress Notes (Signed)
Established Patient Office Visit  Subjective:  Patient ID: Justin Landry, male    DOB: [birth year]  Age: 59 y.o. MRN: [id number]  CC:  Chief Complaint  Patient presents with  . surgical clearance    HPI Justin Landry who presents for physical exam prior to Right Reverse Shoulder Arthroplasty on 09/18/2020. He has a history of Hypertension, Hyperlipidemia, Type 2 Diabetes Mellitus with Peripheral neuropathy. He states that he's compliant with his treatment regimen and checks his blood glucose once daily though he didn't check it this morning. His blood glucose was 142 mg/dl when checked during clinic visit. He had EKG and labs done on 09/10/2020. His EKG was abnormal and during today's visit, his EKG showed left axis deviation and without significant abnormalities. He denies chest pain, palpitation, shortness of breath, fever and chills. Overall, he states that he's doing well and offers no further complaint.  Past Medical History:  Diagnosis Date  . Diabetes mellitus   . Headache    migraines  . High cholesterol   . Humerus fracture    right  . Hypertension   . Inguinal hernia   . Neuromuscular disorder ([place])    neuropathy    Past Surgical History:  Procedure Laterality Date  . AMPUTATION TOE Left 06/23/2019   Procedure: AMPUTATION TOE LEFT AMPUTATION;  Surgeon: Sharlotte Alamo, DPM;  Location: [facility] ORS;  Service: Podiatry;  Laterality: Left;  . AMPUTATION TOE Left 08/31/2019   Procedure: AMPUTATION TOE  IPJ [id number];  Surgeon: Sharlotte Alamo, DPM;  Location: [facility] ORS;  Service: Podiatry;  Laterality: Left;  . CYSTOSCOPY  2015   Biopsy  . HERNIA REPAIR Right 2008   [place]    Family History  Problem Relation Age of Onset  . Diabetes Mother   . Hypertension Mother   . Diabetes Father   . Hypertension Father   . Cancer Brother        brain    Social History   Socioeconomic History  . Marital status: Single    Spouse name: Not on file  . Number of children:  0  . Years of education: Not on file  . Highest education level: 8th grade  Occupational History  . Occupation: unemployed  Tobacco Use  . Smoking status: Current Every Day Smoker    Packs/day: 0.25    Types: Cigarettes  . Smokeless tobacco: Never Used  . Tobacco comment: cut down to quarter pack a day, aware of resources  Vaping Use  . Vaping Use: Never used  Substance and Sexual Activity  . Alcohol use: Yes    Alcohol/week: 2.0 standard drinks    Types: 2 Cans of beer per week    Comment: occasional  . Drug use: Yes    Types: Marijuana    Comment: daily - for neuropathy  . Sexual activity: Yes  Other Topics Concern  . Not on file  Social History Narrative   Social determinants completed 02/21/2020. [place] consent obtained. Referral for job assistance, housing, and gas card were made in [place] portal.   Social Determinants of Health   Financial Resource Strain: High Risk  . Difficulty of Paying Living Expenses: Hard  Food Insecurity: No Food Insecurity  . Worried About Charity fundraiser in the Last Year: Never true  . Ran Out of Food in the Last Year: Never true  Transportation Needs: No Transportation Needs  . Lack of Transportation (Medical): No  . Lack of Transportation (Non-Medical): No  Physical Activity: Sufficiently Active  . Days of Exercise per Week: 7 days  . Minutes of Exercise per Session: 40 min  Stress: Stress Concern Present  . Feeling of Stress : Rather much  Social Connections: Moderately Isolated  . Frequency of Communication with Friends and Family: More than three times a week  . Frequency of Social Gatherings with Friends and Family: Three times a week  . Attends Religious Services: 1 to 4 times per year  . Active Member of Clubs or Organizations: No  . Attends Archivist Meetings: Never  . Marital Status: Never married  Intimate Partner Violence: Not At Risk  . Fear of Current or Ex-Partner: No  . Emotionally Abused: No  .  Physically Abused: No  . Sexually Abused: No    Outpatient Medications Prior to Visit  Medication Sig Dispense Refill  . albuterol (VENTOLIN HFA) 108 (90 Base) MCG/ACT inhaler Inhale 1-2 puffs into the lungs every 6 (six) hours as needed for wheezing or shortness of breath.    Marland Kitchen amLODipine (NORVASC) 10 MG tablet Take 1 tablet (10 mg total) by mouth daily. 30 tablet 3  . ascorbic acid (VITAMIN C) 500 MG tablet Take 500 mg by mouth daily.     . blood glucose meter kit and supplies KIT Dispense based on patient and insurance preference. Use up to four times daily as directed. (FOR ICD-9 250.00, 250.01). 1 each 0  . Fish Oil-Cholecalciferol (FISH OIL + D3) 1000-1000 MG-UNIT CAPS Take 1 capsule by mouth daily with breakfast.     . gabapentin (NEURONTIN) 400 MG capsule Take 1 capsule (400 mg total) by mouth 3 (three) times daily. (Patient taking differently: Take 400 mg by mouth 2 (two) times daily. ) 90 capsule 3  . glipiZIDE (GLUCOTROL) 10 MG tablet Take 1 tablet (10 mg total) by mouth 2 (two) times daily before a meal. 60 tablet 2  . Glucosamine-Chondroitin (COSAMIN DS PO) Take 1 tablet by mouth daily.    . hydrALAZINE (APRESOLINE) 25 MG tablet Take 1 tablet (25 mg total) by mouth 3 (three) times daily. (Patient taking differently: Take 25 mg by mouth in the morning and at bedtime. ) 90 tablet 0  . lisinopril (ZESTRIL) 40 MG tablet Take 1 tab ( 40 mg) daily (Patient taking differently: Take 40 mg by mouth daily. ) 30 tablet 3  . metFORMIN (GLUCOPHAGE) 1000 MG tablet Take 1 tablet (1,000 mg total) by mouth 2 (two) times daily with a meal. 60 tablet 3  . pioglitazone (ACTOS) 15 MG tablet Take 1 tablet (15 mg total) by mouth daily. 30 tablet 2  . rosuvastatin (CRESTOR) 5 MG tablet Take 1 tablet (5 mg total) by mouth daily at 6 PM. 30 tablet 3  . Baclofen 5 MG TABS Take 5 mg by mouth 3 (three) times daily as needed. 15 tablet 0  . acetaminophen-codeine (TYLENOL #3) 300-30 MG tablet Take 1 tablet by  mouth every 12 (twelve) hours as needed for moderate pain. (Patient not taking: Reported on 09/16/2020) 30 tablet 0  . mirtazapine (REMERON) 15 MG tablet Take 1 tablet (15 mg total) by mouth at bedtime. (Patient not taking: Reported on 09/09/2020) 30 tablet 0   No facility-administered medications prior to visit.    Allergies  Allergen Reactions  . Anacin-3 [Acetaminophen] Nausea And Vomiting  . Morphine And Related Nausea And Vomiting and Other (See Comments)    Extreme sweating   . Oxycontin [Oxycodone Hcl] Other (See Comments)  Extreme NAusea and vomitting follows (Patient Tolerates Percocet short acting)  . Penicillins Nausea And Vomiting    Has patient had a PCN reaction causing immediate rash, facial/tongue/throat swelling, SOB or lightheadedness with hypotension: No Has patient had a PCN reaction causing severe rash involving mucus membranes or skin necrosis: No Has patient had a PCN reaction that required hospitalization No Has patient had a PCN reaction occurring within the last 10 years: No If all of the above answers are "NO", then may proceed with Cephalosporin use.   . Tramadol Nausea Only    ROS Review of Systems  Constitutional: Negative.   HENT: Negative.   Eyes: Negative.   Respiratory: Negative.   Cardiovascular: Negative.   Gastrointestinal: Negative.   Endocrine: Negative.   Genitourinary: Negative.   Musculoskeletal: Positive for arthralgias (Chronic right shoulder pain).  Skin: Negative.   Neurological: Negative.   Hematological: Negative.   Psychiatric/Behavioral: Negative.       Objective:    Physical Exam Constitutional:      Appearance: Normal appearance.  HENT:     Head: Normocephalic and atraumatic.     Nose: Nose normal.     Mouth/Throat:     Mouth: Mucous membranes are moist.  Eyes:     Extraocular Movements: Extraocular movements intact.     Conjunctiva/sclera: Conjunctivae normal.     Pupils: Pupils are equal, round, and reactive  to light.  Cardiovascular:     Rate and Rhythm: Normal rate and regular rhythm.     Pulses: Normal pulses.     Heart sounds: Normal heart sounds.  Pulmonary:     Effort: Pulmonary effort is normal.     Breath sounds: Normal breath sounds.  Abdominal:     General: Bowel sounds are normal.     Palpations: Abdomen is soft.  Genitourinary:    Comments: Deferred per Patient. Musculoskeletal:        General: Tenderness (right shoulder) present.     Cervical back: Normal range of motion.  Skin:    General: Skin is warm and dry.  Neurological:     General: No focal deficit present.     Mental Status: He is alert and oriented to person, place, and time. Mental status is at baseline.  Psychiatric:        Mood and Affect: Mood normal.        Behavior: Behavior normal.        Thought Content: Thought content normal.        Judgment: Judgment normal.     BP (!) 144/91 (BP Location: Left Arm, Patient Position: Sitting)   Pulse 85   Wt 212 lb 8 oz (96.4 kg)   SpO2 95%   BMI 33.28 kg/m  Wt Readings from Last 3 Encounters:  09/16/20 212 lb 8 oz (96.4 kg)  09/10/20 212 lb 4.8 oz (96.3 kg)  07/14/20 (!) 207 lb (93.9 kg)   He was encouraged to lose weight  Health Maintenance Due  Topic Date Due  . Hepatitis C Screening  Never done  . PNEUMOCOCCAL POLYSACCHARIDE VACCINE AGE 33-64 HIGH RISK  Never done  . OPHTHALMOLOGY EXAM  Never done  . COVID-19 Vaccine (1) Never done  . HIV Screening  Never done  . COLONOSCOPY  Never done  . FOOT EXAM  06/21/2020  . INFLUENZA VACCINE  07/13/2020    There are no preventive care reminders to display for this patient.  No results found for: TSH Lab Results  Component Value Date  WBC 12.1 (H) 09/10/2020   HGB 15.8 09/10/2020   HCT 47.8 09/10/2020   MCV 91.6 09/10/2020   PLT 303 09/10/2020   Lab Results  Component Value Date   NA 135 09/10/2020   K 4.4 09/10/2020   CO2 22 09/10/2020   GLUCOSE 127 (H) 09/10/2020   BUN 18 09/10/2020    CREATININE 0.80 09/10/2020   BILITOT 0.5 09/10/2020   ALKPHOS 48 09/10/2020   AST 30 09/10/2020   ALT 39 09/10/2020   PROT 7.4 09/10/2020   ALBUMIN 4.2 09/10/2020   CALCIUM 9.9 09/10/2020   ANIONGAP 10 09/10/2020   GFR 87.21 05/27/2015   Lab Results  Component Value Date   CHOL 151 06/25/2020   Lab Results  Component Value Date   HDL 35 (L) 06/25/2020   Lab Results  Component Value Date   LDLCALC 65 06/25/2020   Lab Results  Component Value Date   TRIG 322 (H) 06/25/2020   Lab Results  Component Value Date   CHOLHDL 4.3 06/25/2020   Lab Results  Component Value Date   HGBA1C 7.2 (H) 09/10/2020      Assessment & Plan:   1. Pre-operative clearance -From medical stand point, his EKG was abnormal and  Anesthesia reviewed his prior EKG. He can proceed with his Surgery. He is competent to make decisions and verbalized that he will proceed with his surgical procedure on 09/18/2020.  2. Abnormal EKG - His EKG was abnormal and - Ambulatory referral to Cardiology post surgery was ordered.     Follow-up: Return in about 6 weeks (around 10/30/2020), or if symptoms worsen or fail to improve.    Christiane Sistare Jerold Coombe, NP

## 2020-09-17 ENCOUNTER — Other Ambulatory Visit: Payer: Self-pay

## 2020-09-18 ENCOUNTER — Encounter (HOSPITAL_COMMUNITY)
Admission: RE | Disposition: A | Discharge status: Home / Self Care | Payer: Self-pay | Source: Home / Self Care | Attending: Orthopedic Surgery

## 2020-09-18 ENCOUNTER — Ambulatory Visit (HOSPITAL_COMMUNITY): Payer: Self-pay | Admitting: Anesthesiology

## 2020-09-18 ENCOUNTER — Ambulatory Visit (HOSPITAL_COMMUNITY): Payer: Self-pay | Admitting: Physician Assistant

## 2020-09-18 ENCOUNTER — Ambulatory Visit (HOSPITAL_COMMUNITY)
Admission: RE | Admit: 2020-09-18 | Discharge: 2020-09-18 | Disposition: A | Discharge status: Home / Self Care | Payer: Self-pay | Attending: Orthopedic Surgery | Admitting: Orthopedic Surgery

## 2020-09-18 ENCOUNTER — Other Ambulatory Visit: Payer: Self-pay

## 2020-09-18 ENCOUNTER — Encounter (HOSPITAL_COMMUNITY): Payer: Self-pay | Admitting: Orthopedic Surgery

## 2020-09-18 DIAGNOSIS — M75101 Unspecified rotator cuff tear or rupture of right shoulder, not specified as traumatic: Secondary | ICD-10-CM | POA: Insufficient documentation

## 2020-09-18 DIAGNOSIS — Z6833 Body mass index (BMI) 33.0-33.9, adult: Secondary | ICD-10-CM | POA: Insufficient documentation

## 2020-09-18 DIAGNOSIS — Z888 Allergy status to other drugs, medicaments and biological substances status: Secondary | ICD-10-CM | POA: Insufficient documentation

## 2020-09-18 DIAGNOSIS — M12811 Other specific arthropathies, not elsewhere classified, right shoulder: Secondary | ICD-10-CM

## 2020-09-18 DIAGNOSIS — E1140 Type 2 diabetes mellitus with diabetic neuropathy, unspecified: Secondary | ICD-10-CM | POA: Insufficient documentation

## 2020-09-18 DIAGNOSIS — Z885 Allergy status to narcotic agent status: Secondary | ICD-10-CM | POA: Insufficient documentation

## 2020-09-18 DIAGNOSIS — M129 Arthropathy, unspecified: Secondary | ICD-10-CM | POA: Insufficient documentation

## 2020-09-18 DIAGNOSIS — Z79899 Other long term (current) drug therapy: Secondary | ICD-10-CM | POA: Insufficient documentation

## 2020-09-18 DIAGNOSIS — E7800 Pure hypercholesterolemia, unspecified: Secondary | ICD-10-CM | POA: Insufficient documentation

## 2020-09-18 DIAGNOSIS — F17210 Nicotine dependence, cigarettes, uncomplicated: Secondary | ICD-10-CM | POA: Insufficient documentation

## 2020-09-18 DIAGNOSIS — Z8249 Family history of ischemic heart disease and other diseases of the circulatory system: Secondary | ICD-10-CM | POA: Insufficient documentation

## 2020-09-18 DIAGNOSIS — Z89422 Acquired absence of other left toe(s): Secondary | ICD-10-CM | POA: Insufficient documentation

## 2020-09-18 DIAGNOSIS — Z833 Family history of diabetes mellitus: Secondary | ICD-10-CM | POA: Insufficient documentation

## 2020-09-18 DIAGNOSIS — Z7984 Long term (current) use of oral hypoglycemic drugs: Secondary | ICD-10-CM | POA: Insufficient documentation

## 2020-09-18 DIAGNOSIS — Z560 Unemployment, unspecified: Secondary | ICD-10-CM | POA: Insufficient documentation

## 2020-09-18 DIAGNOSIS — Z880 Allergy status to penicillin: Secondary | ICD-10-CM | POA: Insufficient documentation

## 2020-09-18 DIAGNOSIS — I10 Essential (primary) hypertension: Secondary | ICD-10-CM | POA: Insufficient documentation

## 2020-09-18 DIAGNOSIS — E669 Obesity, unspecified: Secondary | ICD-10-CM | POA: Insufficient documentation

## 2020-09-18 HISTORY — PX: REVERSE SHOULDER ARTHROPLASTY: SHX5054

## 2020-09-18 LAB — GLUCOSE, CAPILLARY
Glucose-Capillary: 178 mg/dL — ABNORMAL HIGH (ref 70–99)
Glucose-Capillary: 181 mg/dL — ABNORMAL HIGH (ref 70–99)

## 2020-09-18 SURGERY — ARTHROPLASTY, SHOULDER, TOTAL, REVERSE
Anesthesia: Regional | Site: Shoulder | Laterality: Right

## 2020-09-18 MED ORDER — OXYCODONE-ACETAMINOPHEN 5-325 MG PO TABS: 1.0000 | ORAL_TABLET | ORAL | 0 refills | Status: DC | PRN

## 2020-09-18 MED ORDER — LACTATED RINGERS IV SOLN: INTRAVENOUS | Status: DC

## 2020-09-18 MED ORDER — CELECOXIB 100 MG PO CAPS: 100.0000 mg | ORAL_CAPSULE | Freq: Two times a day (BID) | ORAL | 0 refills | Status: DC

## 2020-09-18 MED ORDER — HYDROCODONE-ACETAMINOPHEN 7.5-325 MG PO TABS: 1.0000 | ORAL_TABLET | Freq: Once | ORAL | Status: DC | PRN

## 2020-09-18 MED ORDER — METHOCARBAMOL 500 MG PO TABS
500.0000 mg | ORAL_TABLET | Freq: Three times a day (TID) | ORAL | Status: DC | PRN
  Administered 2020-09-18: 500 mg via ORAL

## 2020-09-18 MED ORDER — MIDAZOLAM HCL 2 MG/2ML IJ SOLN
2.0000 mg | Freq: Once | INTRAMUSCULAR | Status: AC
  Filled 2020-09-18: qty 2

## 2020-09-18 MED ORDER — 0.9 % SODIUM CHLORIDE (POUR BTL) OPTIME
TOPICAL | Status: DC | PRN
  Administered 2020-09-18 (×9): 1000 mL

## 2020-09-18 MED ORDER — POVIDONE-IODINE 7.5 % EX SOLN
Freq: Once | CUTANEOUS | Status: AC
  Administered 2020-09-18: 2 via TOPICAL
  Filled 2020-09-18: qty 118

## 2020-09-18 MED ORDER — IRRISEPT - 450ML BOTTLE WITH 0.05% CHG IN STERILE WATER, USP 99.95% OPTIME
TOPICAL | Status: DC | PRN
  Administered 2020-09-18: 450 mL

## 2020-09-18 MED ORDER — VANCOMYCIN HCL 1000 MG IV SOLR
INTRAVENOUS | Status: AC
  Filled 2020-09-18: qty 1000

## 2020-09-18 MED ORDER — HYDROMORPHONE HCL 1 MG/ML IJ SOLN
0.2500 mg | INTRAMUSCULAR | Status: DC | PRN
  Administered 2020-09-18: 0.25 mg via INTRAVENOUS

## 2020-09-18 MED ORDER — SUGAMMADEX SODIUM 200 MG/2ML IV SOLN
INTRAVENOUS | Status: DC | PRN
  Administered 2020-09-18: 50 mg via INTRAVENOUS
  Administered 2020-09-18: 100 mg via INTRAVENOUS
  Administered 2020-09-18: 50 mg via INTRAVENOUS

## 2020-09-18 MED ORDER — CHLORHEXIDINE GLUCONATE 0.12 % MT SOLN
OROMUCOSAL | Status: AC
  Administered 2020-09-18: 15 mL via OROMUCOSAL
  Filled 2020-09-18: qty 15

## 2020-09-18 MED ORDER — TRANEXAMIC ACID-NACL 1000-0.7 MG/100ML-% IV SOLN
INTRAVENOUS | Status: AC
  Filled 2020-09-18: qty 100

## 2020-09-18 MED ORDER — CHLORHEXIDINE GLUCONATE 0.12 % MT SOLN: 15.0000 mL | Freq: Once | OROMUCOSAL | Status: AC

## 2020-09-18 MED ORDER — LIDOCAINE 2% (20 MG/ML) 5 ML SYRINGE
INTRAMUSCULAR | Status: DC | PRN
  Administered 2020-09-18: 20 mg via INTRAVENOUS

## 2020-09-18 MED ORDER — HYDROMORPHONE HCL 1 MG/ML IJ SOLN
INTRAMUSCULAR | Status: AC
  Filled 2020-09-18: qty 1

## 2020-09-18 MED ORDER — FENTANYL CITRATE (PF) 100 MCG/2ML IJ SOLN: 100.0000 ug | Freq: Once | INTRAMUSCULAR | Status: AC

## 2020-09-18 MED ORDER — VANCOMYCIN HCL IN DEXTROSE 1-5 GM/200ML-% IV SOLN
INTRAVENOUS | Status: AC
  Administered 2020-09-18: 1000 mg via INTRAVENOUS
  Filled 2020-09-18: qty 200

## 2020-09-18 MED ORDER — ORAL CARE MOUTH RINSE: 15.0000 mL | Freq: Once | OROMUCOSAL | Status: AC

## 2020-09-18 MED ORDER — DEXAMETHASONE SODIUM PHOSPHATE 10 MG/ML IJ SOLN
INTRAMUSCULAR | Status: DC | PRN
  Administered 2020-09-18: 5 mg via INTRAVENOUS

## 2020-09-18 MED ORDER — ROCURONIUM BROMIDE 10 MG/ML (PF) SYRINGE
PREFILLED_SYRINGE | INTRAVENOUS | Status: DC | PRN
  Administered 2020-09-18: 100 mg via INTRAVENOUS

## 2020-09-18 MED ORDER — FENTANYL CITRATE (PF) 250 MCG/5ML IJ SOLN
INTRAMUSCULAR | Status: DC | PRN
Start: 2020-09-18 — End: 2020-09-18
  Administered 2020-09-18: 50 ug via INTRAVENOUS
  Administered 2020-09-18: 100 ug via INTRAVENOUS
  Administered 2020-09-18: 50 ug via INTRAVENOUS
  Administered 2020-09-18 (×2): 25 ug via INTRAVENOUS

## 2020-09-18 MED ORDER — KETOROLAC TROMETHAMINE 30 MG/ML IJ SOLN: 30.0000 mg | Freq: Once | INTRAMUSCULAR | Status: DC | PRN

## 2020-09-18 MED ORDER — METHOCARBAMOL 500 MG PO TABS
ORAL_TABLET | ORAL | Status: AC
  Filled 2020-09-18: qty 1

## 2020-09-18 MED ORDER — METHOCARBAMOL 500 MG PO TABS
500.0000 mg | ORAL_TABLET | Freq: Three times a day (TID) | ORAL | 0 refills | Status: DC | PRN
Start: 2020-09-18 — End: 2020-11-12

## 2020-09-18 MED ORDER — FENTANYL CITRATE (PF) 100 MCG/2ML IJ SOLN
INTRAMUSCULAR | Status: AC
  Administered 2020-09-18: 100 ug via INTRAVENOUS
  Filled 2020-09-18: qty 2

## 2020-09-18 MED ORDER — CEPHALEXIN 500 MG PO CAPS: 1000.0000 mg | ORAL_CAPSULE | Freq: Two times a day (BID) | ORAL | 0 refills | Status: DC

## 2020-09-18 MED ORDER — ONDANSETRON HCL 4 MG/2ML IJ SOLN
INTRAMUSCULAR | Status: DC | PRN
  Administered 2020-09-18: 4 mg via INTRAVENOUS

## 2020-09-18 MED ORDER — PROMETHAZINE HCL 25 MG/ML IJ SOLN: 6.2500 mg | INTRAMUSCULAR | Status: DC | PRN

## 2020-09-18 MED ORDER — PROPOFOL 10 MG/ML IV BOLUS
INTRAVENOUS | Status: DC | PRN
  Administered 2020-09-18: 150 mg via INTRAVENOUS
  Administered 2020-09-18: 50 mg via INTRAVENOUS

## 2020-09-18 MED ORDER — TRANEXAMIC ACID-NACL 1000-0.7 MG/100ML-% IV SOLN
1000.0000 mg | INTRAVENOUS | Status: AC
  Administered 2020-09-18: 1000 mg via INTRAVENOUS

## 2020-09-18 MED ORDER — METHOCARBAMOL 500 MG PO TABS: 500.0000 mg | ORAL_TABLET | Freq: Three times a day (TID) | ORAL | 0 refills | Status: DC | PRN

## 2020-09-18 MED ORDER — MIDAZOLAM HCL 2 MG/2ML IJ SOLN
INTRAMUSCULAR | Status: AC
  Administered 2020-09-18: 2 mg via INTRAVENOUS
  Filled 2020-09-18: qty 2

## 2020-09-18 MED ORDER — MIDAZOLAM HCL 2 MG/2ML IJ SOLN
INTRAMUSCULAR | Status: AC
  Filled 2020-09-18: qty 2

## 2020-09-18 MED ORDER — POVIDONE-IODINE 10 % EX SWAB
2.0000 "application " | Freq: Once | CUTANEOUS | Status: AC
  Administered 2020-09-18: 2 via TOPICAL

## 2020-09-18 MED ORDER — VANCOMYCIN HCL 1000 MG IV SOLR
INTRAVENOUS | Status: DC | PRN
  Administered 2020-09-18: 1000 mg via TOPICAL

## 2020-09-18 MED ORDER — CEPHALEXIN 500 MG PO CAPS: 1000.0000 mg | ORAL_CAPSULE | Freq: Two times a day (BID) | ORAL | 0 refills | Status: AC

## 2020-09-18 MED ORDER — FENTANYL CITRATE (PF) 250 MCG/5ML IJ SOLN
INTRAMUSCULAR | Status: AC
  Filled 2020-09-18: qty 5

## 2020-09-18 MED ORDER — PROPOFOL 10 MG/ML IV BOLUS
INTRAVENOUS | Status: AC
  Filled 2020-09-18: qty 40

## 2020-09-18 MED ORDER — PHENYLEPHRINE HCL-NACL 10-0.9 MG/250ML-% IV SOLN
INTRAVENOUS | Status: DC | PRN
  Administered 2020-09-18: 25 ug/min via INTRAVENOUS

## 2020-09-18 MED ORDER — VANCOMYCIN HCL IN DEXTROSE 1-5 GM/200ML-% IV SOLN: 1000.0000 mg | INTRAVENOUS | Status: AC

## 2020-09-18 SURGICAL SUPPLY — 90 items
ALCOHOL 70% 16 OZ (MISCELLANEOUS) ×2 IMPLANT
AUG COMP REV MI TAPER ADAPTER (Joint) ×2 IMPLANT
AUGMENT COMP REV MI TAPR ADPTR (Joint) ×1 IMPLANT
BEARING HUMERAL SHLDER 36M STD (Shoulder) ×1 IMPLANT
BIT DRILL 2.7 W/STOP DISP (BIT) ×2 IMPLANT
BIT DRILL F/CENTRAL SCRW 3.2 (BIT) ×1
BIT DRILL F/CENTRAL SCRW 3.2MM (BIT) ×1 IMPLANT
BIT DRILL QUICK REL 1/8 2PK SL (DRILL) ×1 IMPLANT
BIT DRILL TWIST 2.7 (BIT) ×2 IMPLANT
BLADE SAW SGTL 13X75X1.27 (BLADE) ×2 IMPLANT
Broadband Tape ×4 IMPLANT
CHLORAPREP W/TINT 26 (MISCELLANEOUS) ×2 IMPLANT
COVER SURGICAL LIGHT HANDLE (MISCELLANEOUS) ×4 IMPLANT
COVER WAND RF STERILE (DRAPES) IMPLANT
DRAPE INCISE IOBAN 66X45 STRL (DRAPES) ×4 IMPLANT
DRAPE U-SHAPE 47X51 STRL (DRAPES) ×4 IMPLANT
DRILL BIT F/CENTRAL SCRW 3.2MM (BIT) ×2
DRILL QUICK RELEASE 1/8 INCH (DRILL) ×2
DRSG AQUACEL AG ADV 3.5X10 (GAUZE/BANDAGES/DRESSINGS) ×2 IMPLANT
ELECT BLADE 4.0 EZ CLEAN MEGAD (MISCELLANEOUS) ×2
ELECT REM PT RETURN 9FT ADLT (ELECTROSURGICAL) ×2
ELECTRODE BLDE 4.0 EZ CLN MEGD (MISCELLANEOUS) ×1 IMPLANT
ELECTRODE REM PT RTRN 9FT ADLT (ELECTROSURGICAL) ×1 IMPLANT
GAUZE SPONGE 4X4 12PLY STRL LF (GAUZE/BANDAGES/DRESSINGS) ×2 IMPLANT
GLENOID SPHERE 36MM CVD +3 (Orthopedic Implant) ×2 IMPLANT
GLOVE BIOGEL PI IND STRL 7.0 (GLOVE) ×1 IMPLANT
GLOVE BIOGEL PI IND STRL 8 (GLOVE) ×1 IMPLANT
GLOVE BIOGEL PI INDICATOR 7.0 (GLOVE) ×1
GLOVE BIOGEL PI INDICATOR 8 (GLOVE) ×1
GLOVE ECLIPSE 7.0 STRL STRAW (GLOVE) ×2 IMPLANT
GLOVE ECLIPSE 8.0 STRL XLNG CF (GLOVE) ×2 IMPLANT
GOWN STRL REUS W/ TWL LRG LVL3 (GOWN DISPOSABLE) ×2 IMPLANT
GOWN STRL REUS W/ TWL XL LVL3 (GOWN DISPOSABLE) IMPLANT
GOWN STRL REUS W/TWL LRG LVL3 (GOWN DISPOSABLE) ×4
GOWN STRL REUS W/TWL XL LVL3 (GOWN DISPOSABLE)
GUIDE MODEL REV SHLD RT (ORTHOPEDIC DISPOSABLE SUPPLIES) ×2 IMPLANT
HYDROGEN PEROXIDE 16OZ (MISCELLANEOUS) ×2 IMPLANT
JET LAVAGE IRRISEPT WOUND (IRRIGATION / IRRIGATOR) ×2
KIT BASIN OR (CUSTOM PROCEDURE TRAY) ×2 IMPLANT
KIT TURNOVER KIT B (KITS) ×2 IMPLANT
LAVAGE JET IRRISEPT WOUND (IRRIGATION / IRRIGATOR) ×1 IMPLANT
LOOP VESSEL MAXI BLUE (MISCELLANEOUS) ×2 IMPLANT
MANIFOLD NEPTUNE II (INSTRUMENTS) ×2 IMPLANT
NDL SUT 6 .5 CRC .975X.05 MAYO (NEEDLE) IMPLANT
NEEDLE MAYO TAPER (NEEDLE)
NEEDLE TAPERED W/ NITINOL LOOP (MISCELLANEOUS) IMPLANT
NS IRRIG 1000ML POUR BTL (IV SOLUTION) ×2 IMPLANT
PACK SHOULDER (CUSTOM PROCEDURE TRAY) ×2 IMPLANT
PAD ARMBOARD 7.5X6 YLW CONV (MISCELLANEOUS) ×4 IMPLANT
PASSER SUT SWANSON 36MM LOOP (INSTRUMENTS) IMPLANT
PIN THREADED REVERSE (PIN) ×2 IMPLANT
REAMER GUIDE BUSHING SURG DISP (MISCELLANEOUS) ×2 IMPLANT
REAMER GUIDE W/SCREW AUG (MISCELLANEOUS) ×2 IMPLANT
RESTRAINT HEAD UNIVERSAL NS (MISCELLANEOUS) ×2 IMPLANT
SCREW BONE CORT 6.5X35MM (Screw) ×2 IMPLANT
SCREW BONE LOCKING 4.75X30X3.5 (Screw) ×2 IMPLANT
SCREW BONE LOCKING 4.75X35X3.5 (Screw) ×2 IMPLANT
SCREW BONE STRL 6.5MMX25MM (Screw) ×2 IMPLANT
SCREW BONE STRL 6.5MMX30MM (Screw) ×2 IMPLANT
SCREW LOCKING 4.75MMX15MM (Screw) ×4 IMPLANT
SCREW LOCKING NS 4.75MMX20MM (Screw) ×2 IMPLANT
SHOULDER HUMERAL BEAR 36M STD (Shoulder) ×2 IMPLANT
SLING ARM IMMOBILIZER LRG (SOFTGOODS) ×2 IMPLANT
SOL PREP POV-IOD 4OZ 10% (MISCELLANEOUS) ×2 IMPLANT
SPONGE LAP 18X18 RF (DISPOSABLE) ×4 IMPLANT
STEM HUMERAL STRL 14MMX140MM (Stem) ×2 IMPLANT
STRIP CLOSURE SKIN 1/2X4 (GAUZE/BANDAGES/DRESSINGS) ×2 IMPLANT
SUCTION FRAZIER HANDLE 10FR (MISCELLANEOUS) ×2
SUCTION TUBE FRAZIER 10FR DISP (MISCELLANEOUS) ×1 IMPLANT
SUT BROADBAND TAPE 2PK 1.5 (SUTURE) ×4 IMPLANT
SUT FIBERWIRE #2 38 T-5 BLUE (SUTURE)
SUT MAXBRAID (SUTURE) IMPLANT
SUT MNCRL AB 3-0 PS2 18 (SUTURE) ×2 IMPLANT
SUT SILK 2 0 TIES 10X30 (SUTURE) ×2 IMPLANT
SUT VIC AB 0 CT1 27 (SUTURE) ×8
SUT VIC AB 0 CT1 27XBRD ANBCTR (SUTURE) ×4 IMPLANT
SUT VIC AB 1 CT1 27 (SUTURE) ×6
SUT VIC AB 1 CT1 27XBRD ANBCTR (SUTURE) ×3 IMPLANT
SUT VIC AB 2-0 CT1 27 (SUTURE) ×10
SUT VIC AB 2-0 CT1 TAPERPNT 27 (SUTURE) ×5 IMPLANT
SUT VICRYL 0 UR6 27IN ABS (SUTURE) ×6 IMPLANT
SUTURE FIBERWR #2 38 T-5 BLUE (SUTURE) IMPLANT
SYR BULB IRRIG 60ML STRL (SYRINGE) ×2 IMPLANT
TOWEL GREEN STERILE (TOWEL DISPOSABLE) ×2 IMPLANT
TRAY FOL W/BAG SLVR 16FR STRL (SET/KITS/TRAYS/PACK) IMPLANT
TRAY FOLEY W/BAG SLVR 16FR LF (SET/KITS/TRAYS/PACK)
TRAY HUM REV SHOULDER STD +6 (Shoulder) ×2 IMPLANT
TUBE CONNECTING 12X1/4 (SUCTIONS) ×2 IMPLANT
WATER STERILE IRR 1000ML POUR (IV SOLUTION) IMPLANT
YANKAUER SUCT BULB TIP NO VENT (SUCTIONS) ×2 IMPLANT

## 2020-09-18 NOTE — Op Note (Signed)
NAME: CALDWELL, KRONENBERGER MEDICAL RECORD GO:[id number] ACCOUNT [id number] DATE OF BIRTH:[birth year] FACILITY: [facility] LOCATION: [facility] PHYSICIAN:Orenthal Debski Diamantina [place], MD  OPERATIVE REPORT  DATE OF PROCEDURE:  09/18/2020  PREOPERATIVE DIAGNOSIS:  Right shoulder rotator cuff arthropathy.  POSTOPERATIVE DIAGNOSIS:  Right shoulder rotator cuff arthropathy.  PROCEDURE:  Right shoulder reverse shoulder replacement using Biomet components, small augmented baseplate, 36+3 glenosphere, size 14 micro stem, +6 mini humeral tray 40 mm in diameter with 36 standard bearing.  SURGEON:  Cammy Copa, MD  ASSISTANT:  Karenann Cai, PA.  INDICATIONS:  This is a 59 year old patient with end-stage right shoulder rotator cuff arthropathy with failure of conservative management, who presents for operative management after explanation of risks and benefits.  PROCEDURE IN DETAIL:  The patient was brought to the operating room where general endotracheal anesthesia was induced.  Preoperative antibiotics administered.  Timeout was called.  Right shoulder was prescrubbed with hydrogen peroxide.  He had undergone  benzoyl peroxide prepping for 3 days prior to surgery.  He was then scrubbed with hydrogen peroxide, followed by alcohol and then Betadine, which was allowed to air dry, then prepped with ChloraPrep solution and draped in sterile manner.  Ioban used to  cover the operative field.  Timeout was called.  The patient was placed in the beach chair position with the head in neutral position.  Deltopectoral approach was made.  Cephalic vein mobilized medially.  The patient had an extremely large deltoid.   Deltoid was released manually off of the attachment site on the humerus anteriorly.  The bursa was removed and the subdeltoid and subacromial spaces were developed manually.  The axillary nerve was identified and a vessel loop placed around it, protected  at all times during the case.  The circumflex vessels  were then ligated.  The subscapularis was then detached from the lesser tuberosity.  The patient did have about 50 degrees of external rotation in the preoperative examination.  Subscap was tied.  At  this time, the capsule was released around to the 5 o'clock position on the right humerus.  Capsule was also manually released about 2 cm down the inferior humeral neck.  The pec was also released 2 cm in order to facilitate mobilization of the humerus  away from the glenoid.  Next, attention was directed towards the humerus.  The head was dislocated.  Infraspinatus and supraspinatus were torn.  The head was entered superiorly and reamed up to size 14.  The head was then cut 30 degrees of retroversion  and next broached up to a size 14.  A cap was placed and attention was directed towards the glenoid.  Glenoid exposure was difficult due to the mass of the patient's deltoid.  Labrum was circumferentially excised.  Care was taken to avoid injury to the  axillary nerve.  Anterior retractor was placed.  The patient-specific guide was then placed and then drilled into position with a good position achieved.  Preoperative planning placed the glenosphere in neutral version and slight inferior tilt between 5  and 10 degrees.  Next, reaming was performed, both inferiorly as well as for the augment.  The glenoid baseplate was then placed and excellent contact was achieved.  Very good bone quality was present.  A compression screw centrally was placed, along  with 4 locking screws with good fixation achieved.  A +3 glenosphere was chosen and placed into position.  This was placed with 2 mm offset inferiorly.  At this time, attention was directed towards the  humerus.  A +6 humeral tray offset plus standard  bearing was placed and the patient had very good range of motion, difficult to reduce.  Good stability with adduction and extension.  Very good range of motion with no impingement.  Trial components were  removed on the  humeral side, true components  placed with same stability parameters maintained.  It should be noted that 4 MaxBraid suture tapes were placed and this was used to repair the subscap in 30 degrees of external rotation using Nice knots.  Thorough irrigation was then performed with 3 L  of irrigating solution, followed by IrriSept solution, which was used after the incision and at all times during the case.  Vancomycin powder placed within the joint and the deltopectoral interval was closed after the subscapularis was repaired.  Next,  thorough irrigation again performed and the rest of the vancomycin powder was placed above the deltopectoral closure.  Incision was then closed using 0 Vicryl suture, 2-0 Vicryl suture and 3-0 nylon.  The patient tolerated the procedure well without  immediate complications, transferred to the recovery room in stable condition.  Monocryl was used to close the skin with Steri-Strips and Aquacel dressing.  Luke's assistance was required at all times for opening, closing, retraction.  His assistance was  a medical necessity.  VN/NUANCE  D:09/18/2020 T:09/18/2020 JOB:[id number]

## 2020-09-18 NOTE — Anesthesia Procedure Notes (Signed)
Procedure Name: Intubation Date/Time: 09/18/2020 12:30 PM Performed by: Rande Brunt, CRNA Pre-anesthesia Checklist: Patient identified, Emergency Drugs available, Suction available and Patient being monitored Patient Re-evaluated:Patient Re-evaluated prior to induction Oxygen Delivery Method: Circle System Utilized Preoxygenation: Pre-oxygenation with 100% oxygen Induction Type: IV induction Ventilation: Mask ventilation without difficulty Laryngoscope Size: Mac and 4 Grade View: Grade II Tube type: Oral Tube size: 7.5 mm Number of attempts: 1 Airway Equipment and Method: Stylet Placement Confirmation: ETT inserted through vocal cords under direct vision,  positive ETCO2 and breath sounds checked- equal and bilateral Secured at: 22 cm Tube secured with: Tape Dental Injury: Teeth and Oropharynx as per pre-operative assessment

## 2020-09-18 NOTE — H&P (Signed)
Justin Landry is an 59 y.o. male.   Chief Complaint: Right shoulder pain HPI: .Justin Landry is a 59 year old patient with right shoulder rotator cuff arthropathy. Last seen 1020. He has not had prior surgery on the right shoulder. He is right-hand dominant. Currently is unemployed. He has been able to get his hemoglobin A1c down to 7.6. Describes both pain and weakness in the right shoulder region. 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 He states that he has "bad pain" in the right shoulder.  This pain affects his sleep as well as activities of daily living.  This also affects his ability to be employed.  He has advanced imaging which shows superior migration of the humeral head along with rotator cuff arthropathy rotator cuff tearing and atrophy of the muscle bellies.  Past Medical History:  Diagnosis Date  . Diabetes mellitus   . Headache    migraines  . High cholesterol   . Humerus fracture    right  . Hypertension   . Inguinal hernia   . Neuromuscular disorder ([place])    neuropathy    Past Surgical History:  Procedure Laterality Date  . AMPUTATION TOE Left 06/23/2019   Procedure: AMPUTATION TOE LEFT AMPUTATION;  Surgeon: Sharlotte Alamo, DPM;  Location: [facility];  Service: Podiatry;  Laterality: Left;  . AMPUTATION TOE Left 08/31/2019   Procedure: AMPUTATION TOE  IPJ [id number];  Surgeon: Sharlotte Alamo, DPM;  Location: [facility] OR[facility];  Service: Podiatry;  Laterality: Left;  . CYSTOSCOPY  2015   Biopsy  . HERNIA REPAIR Right 2008   [place]    Family History  Problem Relation Age of Onset  . Diabetes Mother   . Hypertension Mother   . Diabetes Father   . Hypertension Father   . Cancer Brother        brain   Social History:  reports that he has been smoking cigarettes. He has been smoking about 0.25 packs per day. He has never used smokeless tobacco. He reports current alcohol use of about 2.0 standard drinks of alcohol per week. He reports current drug use. Drug:  Marijuana.  Allergies:  Allergies  Allergen Reactions  . Anacin-3 [Acetaminophen] Nausea And Vomiting  . Morphine And Related Nausea And Vomiting and Other (See Comments)    Extreme sweating   . Oxycontin [Oxycodone Hcl] Other (See Comments)    Extreme NAusea and vomitting follows (Patient Tolerates Percocet short acting)  . Penicillins Nausea And Vomiting    Has patient had a PCN reaction causing immediate rash, facial/tongue/throat swelling, SOB or lightheadedness with hypotension: No Has patient had a PCN reaction causing severe rash involving mucus membranes or skin necrosis: No Has patient had a PCN reaction that required hospitalization No Has patient had a PCN reaction occurring within the last 10 years: No If all of the above answers are "NO", then may proceed with Cephalosporin use.   . Tramadol Nausea Only    Medications Prior to Admission  Medication Sig Dispense Refill  . albuterol (VENTOLIN HFA) 108 (90 Base) MCG/ACT inhaler Inhale 1-2 puffs into the lungs every 6 (six) hours as needed for wheezing or shortness of breath.    Marland Kitchen amLODipine (NORVASC) 10 MG tablet Take 1 tablet (10 mg total) by mouth daily. 30 tablet 3  . ascorbic acid (VITAMIN C) 500 MG tablet Take 500 mg by mouth daily.     . Fish Oil-Cholecalciferol (FISH OIL + D3) 1000-1000 MG-UNIT CAPS Take 1 capsule by mouth daily with breakfast.     .  gabapentin (NEURONTIN) 400 MG capsule Take 1 capsule (400 mg total) by mouth 3 (three) times daily. (Patient taking differently: Take 400 mg by mouth 2 (two) times daily. ) 90 capsule 3  . glipiZIDE (GLUCOTROL) 10 MG tablet Take 1 tablet (10 mg total) by mouth 2 (two) times daily before a meal. 60 tablet 2  . Glucosamine-Chondroitin (COSAMIN DS PO) Take 1 tablet by mouth daily.    . hydrALAZINE (APRESOLINE) 25 MG tablet Take 1 tablet (25 mg total) by mouth 3 (three) times daily. (Patient taking differently: Take 25 mg by mouth in the morning and at bedtime. ) 90 tablet 0  .  lisinopril (ZESTRIL) 40 MG tablet Take 1 tab ( 40 mg) daily (Patient taking differently: Take 40 mg by mouth daily. ) 30 tablet 3  . metFORMIN (GLUCOPHAGE) 1000 MG tablet Take 1 tablet (1,000 mg total) by mouth 2 (two) times daily with a meal. 60 tablet 3  . pioglitazone (ACTOS) 15 MG tablet Take 1 tablet (15 mg total) by mouth daily. 30 tablet 2  . rosuvastatin (CRESTOR) 5 MG tablet Take 1 tablet (5 mg total) by mouth daily at 6 PM. 30 tablet 3  . blood glucose meter kit and supplies KIT Dispense based on patient and insurance preference. Use up to four times daily as directed. (FOR ICD-9 250.00, 250.01). 1 each 0    Results for orders placed or performed during the hospital encounter of 09/18/20 (from the past 48 hour(s))  Glucose, capillary     Status: Abnormal   Collection Time: 09/18/20 10:48 AM  Result Value Ref Range   Glucose-Capillary 178 (H) 70 - 99 mg/dL    Comment: Glucose reference range applies only to samples taken after fasting for at least 8 hours.   No results found.  Review of Systems  Musculoskeletal: Positive for arthralgias.  All other systems reviewed and are negative.   Blood pressure (!) 173/86, pulse 77, temperature 97.7 F (36.5 C), temperature source Oral, resp. rate 17, height 5' 7"  (1.702 m), weight 95.3 kg, SpO2 98 %. Physical Exam Vitals reviewed.  HENT:     Head: Normocephalic.     Nose: Nose normal.     Mouth/Throat:     Mouth: Mucous membranes are moist.  Eyes:     Pupils: Pupils are equal, round, and reactive to light.  Cardiovascular:     Rate and Rhythm: Normal rate.     Pulses: Normal pulses.  Pulmonary:     Effort: Pulmonary effort is normal.  Abdominal:     General: Abdomen is flat.  Musculoskeletal:     Cervical back: Normal range of motion.  Skin:    General: Skin is warm.     Capillary Refill: Capillary refill takes less than 2 seconds.  Neurological:     General: No focal deficit present.     Mental Status: He is alert.   Psychiatric:        Mood and Affect: Mood normal.     Examination of the right shoulder demonstrates forward flexion abduction both below 90 degrees.  Deltoid is functional.  Subscap strength intact.  Popeye deformity present.  Motor sensory function of the hand is intact Assessment/Plan Impression is right shoulder rotator cuff arthropathy with significant pain and functional disability.  Patient is on the young side for rotator cuff arthropathy treatment with reverse shoulder replacement.  Risk benefits are discussed with the patient include not limited to infection nerve vessel damage lifetime limit of 15 pounds  lifting along with dislocation and potential need for revision in his lifetime.  If it does come to revision for loosening then it may be impossible to revise this reverse shoulder replacement.  Nonetheless it is his only reasonable option at this time for pain relief and improved function.  Patient understands risk and benefits of surgery.  His hemoglobin A1c is below 8.  Is agreeable to shoulder replacement and actually has been wanting to get this done first many months.  All questions answered.  Although this is not ideal it is really his only option for some improvement in quality of life for the next 10 to 15 years.  He understands this.  All questions answered  Anderson Malta, MD 09/18/2020, 11:35 AM

## 2020-09-18 NOTE — Brief Op Note (Signed)
   09/18/2020  5:15 PM  PATIENT:  Mittie Bodo  59 y.o. male  PRE-OPERATIVE DIAGNOSIS:  right shoulder rotator cuff arthropathy  POST-OPERATIVE DIAGNOSIS:  right shoulder rotator cuff arthropathy  PROCEDURE:  Procedure(s): RIGHT REVERSE SHOULDER ARTHROPLASTY  SURGEON:  Surgeon(s): Cammy Copa, MD  ASSISTANT: magnant pa  ANESTHESIA:   general  EBL: 200 ml    Total I/O In: 1300 [I.V.:1200; IV Piggyback:100] Out: 250 [Blood:250]  BLOOD ADMINISTERED: none  DRAINS: none   LOCAL MEDICATIONS USED:  vancoe  SPECIMEN:  No Specimen  COUNTS:  YES  TOURNIQUET:  * No tourniquets in log *  DICTATION: .Other Dictation: Dictation Number [id number]  PLAN OF CARE: Discharge to home after PACU  PATIENT DISPOSITION:  PACU - hemodynamically stable

## 2020-09-18 NOTE — Transfer of Care (Signed)
Immediate Anesthesia Transfer of Care Note  Patient: Justin Landry  Procedure(s) Performed: RIGHT REVERSE SHOULDER ARTHROPLASTY (Right Shoulder)  Patient Location: PACU  Anesthesia Type:General  Level of Consciousness: awake  Airway & Oxygen Therapy: Patient Spontanous Breathing  Post-op Assessment: Report given to RN and Post -op Vital signs reviewed and stable  Post vital signs: Reviewed and stable  Last Vitals:  Vitals Value Taken Time  BP 143/81 09/18/20 1656  Temp 36.6 C 09/18/20 1655  Pulse 87 09/18/20 1659  Resp 23 09/18/20 1659  SpO2 92 % 09/18/20 1659  Vitals shown include unvalidated device data.  Last Pain:  Vitals:   09/18/20 1054  TempSrc:   PainSc: 10-Worst pain ever         Complications: No complications documented.

## 2020-09-19 ENCOUNTER — Encounter (HOSPITAL_COMMUNITY): Payer: Self-pay | Admitting: Orthopedic Surgery

## 2020-09-19 MED ORDER — BUPIVACAINE LIPOSOME 1.3 % IJ SUSP
INTRAMUSCULAR | Status: DC | PRN
  Administered 2020-09-18: 10 mL via PERINEURAL

## 2020-09-19 MED ORDER — BUPIVACAINE HCL (PF) 0.5 % IJ SOLN
INTRAMUSCULAR | Status: DC | PRN
  Administered 2020-09-18: 10 mL via PERINEURAL

## 2020-09-19 NOTE — Anesthesia Postprocedure Evaluation (Signed)
Anesthesia Post Note  Patient: Justin Landry  Procedure(s) Performed: RIGHT REVERSE SHOULDER ARTHROPLASTY (Right Shoulder)     Patient location during evaluation: PACU Anesthesia Type: Regional and General Level of consciousness: awake and alert, oriented and patient cooperative Pain management: pain level controlled Vital Signs Assessment: post-procedure vital signs reviewed and stable Respiratory status: spontaneous breathing, nonlabored ventilation and respiratory function stable Cardiovascular status: blood pressure returned to baseline and stable Postop Assessment: no apparent nausea or vomiting Anesthetic complications: no   No complications documented.  Last Vitals:  Vitals:   09/18/20 1715 09/18/20 1727  BP: 139/85 (!) 143/86  Pulse: 89 85  Resp: (!) 21 16  Temp:  36.5 C  SpO2: 90% 96%    Last Pain:  Vitals:   09/18/20 1700  TempSrc:   PainSc: 8                  Lannie Fields

## 2020-09-19 NOTE — Anesthesia Procedure Notes (Signed)
Anesthesia Regional Block: Interscalene brachial plexus block   Pre-Anesthetic Checklist: ,, timeout performed, Correct Patient, Correct Site, Correct Laterality, Correct Procedure, Correct Position, site marked, Risks and benefits discussed,  Surgical consent,  Pre-op evaluation,  At surgeon's request and post-op pain management  Laterality: Right  Prep: Maximum Sterile Barrier Precautions used, chloraprep       Needles:  Injection technique: Single-shot  Needle Type: Echogenic Stimulator Needle     Needle Length: 9cm  Needle Gauge: 22     Additional Needles:   Procedures:,,,, ultrasound used (permanent image in chart),,,,  Narrative:  Start time: 09/18/2020 12:00 PM End time: 09/18/2020 12:05 PM Injection made incrementally with aspirations every 5 mL.  Performed by: Personally  Anesthesiologist: Lannie Fields, DO  Additional Notes: Monitors applied. No increased pain on injection. No increased resistance to injection. Injection made in 5cc increments. Good needle visualization. Patient tolerated procedure well.

## 2020-09-19 NOTE — Addendum Note (Signed)
Addendum  created 09/19/20 1326 by Lannie Fields, DO   Child order released for a procedure order, Clinical Note Signed, Intraprocedure Blocks edited, Order Canceled from Note

## 2020-09-24 ENCOUNTER — Other Ambulatory Visit: Payer: Self-pay

## 2020-09-24 DIAGNOSIS — E119 Type 2 diabetes mellitus without complications: Secondary | ICD-10-CM

## 2020-09-25 LAB — HEMOGLOBIN A1C
Est. average glucose Bld gHb Est-mCnc: 169 mg/dL
Hgb A1c MFr Bld: 7.5 % — ABNORMAL HIGH (ref 4.8–5.6)

## 2020-09-30 ENCOUNTER — Other Ambulatory Visit: Payer: Self-pay | Admitting: Gerontology

## 2020-09-30 ENCOUNTER — Other Ambulatory Visit: Payer: Self-pay

## 2020-09-30 ENCOUNTER — Ambulatory Visit: Payer: Self-pay | Admitting: Gerontology

## 2020-09-30 VITALS — BP 144/90 | HR 95 | Resp 22

## 2020-09-30 MED ORDER — HYDRALAZINE HCL 25 MG PO TABS: 25.0000 mg | ORAL_TABLET | Freq: Three times a day (TID) | ORAL | 0 refills | Status: DC

## 2020-09-30 MED ORDER — GABAPENTIN 400 MG PO CAPS: 400.0000 mg | ORAL_CAPSULE | Freq: Three times a day (TID) | ORAL | 3 refills | Status: DC

## 2020-09-30 MED ORDER — GLIPIZIDE 10 MG PO TABS: 10.0000 mg | ORAL_TABLET | Freq: Two times a day (BID) | ORAL | 3 refills | Status: DC

## 2020-09-30 MED ORDER — METFORMIN HCL 1000 MG PO TABS: 1000.0000 mg | ORAL_TABLET | Freq: Two times a day (BID) | ORAL | 3 refills | Status: DC

## 2020-09-30 MED ORDER — LISINOPRIL 40 MG PO TABS: ORAL_TABLET | ORAL | 3 refills | Status: DC

## 2020-09-30 MED ORDER — PIOGLITAZONE HCL 15 MG PO TABS: 15.0000 mg | ORAL_TABLET | Freq: Every day | ORAL | 3 refills | Status: DC

## 2020-09-30 MED ORDER — ROSUVASTATIN CALCIUM 5 MG PO TABS: 5.0000 mg | ORAL_TABLET | Freq: Every day | ORAL | 3 refills | Status: DC

## 2020-09-30 NOTE — Progress Notes (Signed)
Established Patient Office Visit  Subjective:  Patient ID: Justin Landry, male    DOB: 28-Oct-1961  Age: 59 y.o. MRN: [id number]  CC:  Chief Complaint  Patient presents with   Medication Refill    HPI Justin Landry presents for medication refill and follow up after R reverse shoulder arthroplasty performed by Dr. Bernerd Limbo for right shoulder rotator cuff arthropathy with significant pain and functional disability on 09/18/20. He reports that the surgery went well. He was on Percocet 5/325 for pain control and rates his pain at a 10/10. He states that he sees the surgeon tomorrow at 115p. Denies fevers, drainage or bleeding from the site. His blood pressure is 144/90 in clinic. He states that his blood pressure has been high for the last few days which he relates to his pain level. He reports medication compliance and following the DASH diet. His A1c on 09/24/20 was 7.5% which is up from his A1c on 09/10/20 which was 7.2%. He reports medication compliance and eating a low carb, low concentrated sweet diet when possible. He denies hyper- or hypoglycemic symptoms. He performs daily foot checks. States that he checks his sugar daily and it averages in the 150s. He is requesting refills of gabapentin, lisinopril and hydralazine. Overall, he states that he is doing well and offers no further complaint.   Past Medical History:  Diagnosis Date   Diabetes mellitus    Headache    migraines   High cholesterol    Humerus fracture    right   Hypertension    Inguinal hernia    Neuromuscular disorder ([place])    neuropathy    Past Surgical History:  Procedure Laterality Date   AMPUTATION TOE Left 06/23/2019   Procedure: AMPUTATION TOE LEFT AMPUTATION;  Surgeon: Sharlotte Alamo, DPM;  Location: [facility];  Service: Podiatry;  Laterality: Left;   AMPUTATION TOE Left 08/31/2019   Procedure: AMPUTATION TOE  IPJ [id number];  Surgeon: Sharlotte Alamo, DPM;  Location: [facility];  Service: Podiatry;   Laterality: Left;   CYSTOSCOPY  2015   Biopsy   HERNIA REPAIR Right 2008   [place]   REVERSE SHOULDER ARTHROPLASTY Right 09/18/2020   Procedure: RIGHT REVERSE SHOULDER ARTHROPLASTY;  Surgeon: Meredith Pel, MD;  Location: [place];  Service: Orthopedics;  Laterality: Right;    Family History  Problem Relation Age of Onset   Diabetes Mother    Hypertension Mother    Diabetes Father    Hypertension Father    Cancer Brother        brain    Social History   Socioeconomic History   Marital status: Single    Spouse name: Not on file   Number of children: 0   Years of education: Not on file   Highest education level: 8th grade  Occupational History   Occupation: unemployed  Tobacco Use   Smoking status: Current Every Day Smoker    Packs/day: 0.25    Types: Cigarettes   Smokeless tobacco: Never Used   Tobacco comment: cut down to quarter pack a day, aware of resources  Vaping Use   Vaping Use: Never used  Substance and Sexual Activity   Alcohol use: Yes    Alcohol/week: 2.0 standard drinks    Types: 2 Cans of beer per week    Comment: occasional   Drug use: Yes    Types: Marijuana    Comment: daily - for neuropathy   Sexual activity: Yes  Other Topics Concern  Not on file  Social History Narrative   Social determinants completed 02/21/2020. [place] consent obtained. Referral for job assistance, housing, and gas card were made in [employer] [place] portal.   Social Determinants of Health   Financial Resource Strain: High Risk   Difficulty of Paying Living Expenses: Hard  Food Insecurity: No Food Insecurity   Worried About Charity fundraiser in the Last Year: Never true   Ran Out of Food in the Last Year: Never true  Transportation Needs: No Transportation Needs   Lack of Transportation (Medical): No   Lack of Transportation (Non-Medical): No  Physical Activity: Sufficiently Active   Days of Exercise per Week: 7 days   Minutes of Exercise  per Session: 40 min  Stress: Stress Concern Present   Feeling of Stress : Rather much  Social Connections: Moderately Isolated   Frequency of Communication with Friends and Family: More than three times a week   Frequency of Social Gatherings with Friends and Family: Three times a week   Attends Religious Services: 1 to 4 times per year   Active Member of Clubs or Organizations: No   Attends Archivist Meetings: Never   Marital Status: Never married  Human resources officer Violence: Not At Risk   Fear of Current or Ex-Partner: No   Emotionally Abused: No   Physically Abused: No   Sexually Abused: No    Outpatient Medications Prior to Visit  Medication Sig Dispense Refill   albuterol (VENTOLIN HFA) 108 (90 Base) MCG/ACT inhaler Inhale 1-2 puffs into the lungs every 6 (six) hours as needed for wheezing or shortness of breath.     amLODipine (NORVASC) 10 MG tablet Take 1 tablet (10 mg total) by mouth daily. 30 tablet 3   ascorbic acid (VITAMIN C) 500 MG tablet Take 500 mg by mouth daily.      blood glucose meter kit and supplies KIT Dispense based on patient and insurance preference. Use up to four times daily as directed. (FOR ICD-9 250.00, 250.01). 1 each 0   celecoxib (CELEBREX) 100 MG capsule Take 1 capsule (100 mg total) by mouth 2 (two) times daily. 60 capsule 0   Fish Oil-Cholecalciferol (FISH OIL + D3) 1000-1000 MG-UNIT CAPS Take 1 capsule by mouth daily with breakfast.      Glucosamine-Chondroitin (COSAMIN DS PO) Take 1 tablet by mouth daily.     methocarbamol (ROBAXIN) 500 MG tablet Take 1 tablet (500 mg total) by mouth every 8 (eight) hours as needed. 30 tablet 0   oxyCODONE-acetaminophen (PERCOCET) 5-325 MG tablet Take 1 tablet by mouth every 4 (four) hours as needed for severe pain. 30 tablet 0   gabapentin (NEURONTIN) 400 MG capsule Take 1 capsule (400 mg total) by mouth 3 (three) times daily. (Patient taking differently: Take 400 mg by mouth 2 (two)  times daily. ) 90 capsule 3   glipiZIDE (GLUCOTROL) 10 MG tablet Take 1 tablet (10 mg total) by mouth 2 (two) times daily before a meal. 60 tablet 2   hydrALAZINE (APRESOLINE) 25 MG tablet Take 1 tablet (25 mg total) by mouth 3 (three) times daily. (Patient taking differently: Take 25 mg by mouth in the morning and at bedtime. ) 90 tablet 0   lisinopril (ZESTRIL) 40 MG tablet Take 1 tab ( 40 mg) daily (Patient taking differently: Take 40 mg by mouth daily. ) 30 tablet 3   metFORMIN (GLUCOPHAGE) 1000 MG tablet Take 1 tablet (1,000 mg total) by mouth 2 (two) times daily  with a meal. 60 tablet 3   pioglitazone (ACTOS) 15 MG tablet Take 1 tablet (15 mg total) by mouth daily. 30 tablet 2   rosuvastatin (CRESTOR) 5 MG tablet Take 1 tablet (5 mg total) by mouth daily at 6 PM. 30 tablet 3   No facility-administered medications prior to visit.    Allergies  Allergen Reactions   Anacin-3 [Acetaminophen] Nausea And Vomiting   Morphine And Related Nausea And Vomiting and Other (See Comments)    Extreme sweating    Oxycontin [Oxycodone Hcl] Other (See Comments)    Extreme NAusea and vomitting follows (Patient Tolerates Percocet short acting)   Penicillins Nausea And Vomiting    Has patient had a PCN reaction causing immediate rash, facial/tongue/throat swelling, SOB or lightheadedness with hypotension: No Has patient had a PCN reaction causing severe rash involving mucus membranes or skin necrosis: No Has patient had a PCN reaction that required hospitalization No Has patient had a PCN reaction occurring within the last 10 years: No If all of the above answers are "NO", then may proceed with Cephalosporin use.    Tramadol Nausea Only    ROS Review of Systems  Constitutional: Negative.   Respiratory: Negative.   Cardiovascular: Negative.   Gastrointestinal: Negative.   Musculoskeletal: Positive for arthralgias.       R shoulder pain post surgery  Psychiatric/Behavioral: Negative.        Objective:    Physical Exam Constitutional:      Appearance: Normal appearance.  Cardiovascular:     Rate and Rhythm: Normal rate and regular rhythm.     Pulses: Normal pulses.     Heart sounds: Normal heart sounds.  Pulmonary:     Effort: Pulmonary effort is normal.     Breath sounds: Normal breath sounds.  Abdominal:     General: Abdomen is flat. Bowel sounds are normal.     Palpations: Abdomen is soft.  Musculoskeletal:     Right shoulder: Tenderness present. Decreased strength.     Cervical back: Normal range of motion.     Comments: Post R shoulder surgery  Skin:    General: Skin is warm and dry.     Capillary Refill: Capillary refill takes less than 2 seconds.  Neurological:     General: No focal deficit present.     Mental Status: He is alert.  Psychiatric:        Mood and Affect: Mood normal.        Behavior: Behavior normal.        Thought Content: Thought content normal.        Judgment: Judgment normal.     BP (!) 144/90 (BP Location: Left Arm, Patient Position: Sitting) Comment (BP Location): left arm   Pulse 95    Resp (!) 22    SpO2 100%  Wt Readings from Last 3 Encounters:  09/18/20 210 lb (95.3 kg)  09/16/20 212 lb 8 oz (96.4 kg)  09/10/20 212 lb 4.8 oz (96.3 kg)     Health Maintenance Due  Topic Date Due   Hepatitis C Screening  Never done   PNEUMOCOCCAL POLYSACCHARIDE VACCINE AGE 4-64 HIGH RISK  Never done   OPHTHALMOLOGY EXAM  Never done   COVID-19 Vaccine (1) Never done   HIV Screening  Never done   COLONOSCOPY  Never done   FOOT EXAM  06/21/2020   INFLUENZA VACCINE  07/13/2020    There are no preventive care reminders to display for this patient.  No results  found for: TSH Lab Results  Component Value Date   WBC 12.1 (H) 09/10/2020   HGB 15.8 09/10/2020   HCT 47.8 09/10/2020   MCV 91.6 09/10/2020   PLT 303 09/10/2020   Lab Results  Component Value Date   NA 135 09/10/2020   K 4.4 09/10/2020   CO2 22 09/10/2020    GLUCOSE 127 (H) 09/10/2020   BUN 18 09/10/2020   CREATININE 0.80 09/10/2020   BILITOT 0.5 09/10/2020   ALKPHOS 48 09/10/2020   AST 30 09/10/2020   ALT 39 09/10/2020   PROT 7.4 09/10/2020   ALBUMIN 4.2 09/10/2020   CALCIUM 9.9 09/10/2020   ANIONGAP 10 09/10/2020   GFR 87.21 05/27/2015   Lab Results  Component Value Date   CHOL 151 06/25/2020   Lab Results  Component Value Date   HDL 35 (L) 06/25/2020   Lab Results  Component Value Date   LDLCALC 65 06/25/2020   Lab Results  Component Value Date   TRIG 322 (H) 06/25/2020   Lab Results  Component Value Date   CHOLHDL 4.3 06/25/2020   Lab Results  Component Value Date   HGBA1C 7.5 (H) 09/24/2020      Assessment & Plan:   1. Essential hypertension He will continue his medication as prescribed. Continue following DASH diet and increasing his activity with a goal of 150 minutes of exercise weekly. - hydrALAZINE (APRESOLINE) 25 MG tablet; Take 1 tablet (25 mg total) by mouth 3 (three) times daily.  Dispense: 90 tablet; Refill: 0 - lisinopril (ZESTRIL) 40 MG tablet; Take 1 tab ( 40 mg) daily  Dispense: 30 tablet; Refill: 3  2. Type 2 diabetes mellitus without complication, without long-term current use of insulin ([place]) He will continue his low carb/low concentrated sugar diet. Advised to check his sugar BID and keep a log for next visit.  - gabapentin (NEURONTIN) 400 MG capsule; Take 1 capsule (400 mg total) by mouth 3 (three) times daily.  Dispense: 90 capsule; Refill: 3 - glipiZIDE (GLUCOTROL) 10 MG tablet; Take 1 tablet (10 mg total) by mouth 2 (two) times daily before a meal.  Dispense: 60 tablet; Refill: 3 - metFORMIN (GLUCOPHAGE) 1000 MG tablet; Take 1 tablet (1,000 mg total) by mouth 2 (two) times daily with a meal.  Dispense: 60 tablet; Refill: 3 - pioglitazone (ACTOS) 15 MG tablet; Take 1 tablet (15 mg total) by mouth daily.  Dispense: 30 tablet; Refill: 3 - HgB A1c; Future  3. Elevated lipids He will  continue medication as prescribed. Advised on low fat/low cholesterol diet and increasing exercise as tolerated.  - rosuvastatin (CRESTOR) 5 MG tablet; Take 1 tablet (5 mg total) by mouth daily at 6 PM.  Dispense: 30 tablet; Refill: 3 - Lipid Profile; Future  4. Chronic right shoulder pain Follow up with Ortho surgeon Dr. Marlou Sa scheduled tomorrow at 115p.    Follow-up: Return in about 3 months (around 12/31/2020).    Marina Gravel, Student-NP

## 2020-10-01 ENCOUNTER — Ambulatory Visit (INDEPENDENT_AMBULATORY_CARE_PROVIDER_SITE_OTHER): Payer: Self-pay | Admitting: Orthopedic Surgery

## 2020-10-01 ENCOUNTER — Ambulatory Visit (INDEPENDENT_AMBULATORY_CARE_PROVIDER_SITE_OTHER): Payer: Self-pay

## 2020-10-01 ENCOUNTER — Encounter: Payer: Self-pay | Admitting: Orthopedic Surgery

## 2020-10-01 DIAGNOSIS — Z96611 Presence of right artificial shoulder joint: Secondary | ICD-10-CM

## 2020-10-01 MED ORDER — OXYCODONE-ACETAMINOPHEN 5-325 MG PO TABS
1.0000 | ORAL_TABLET | Freq: Four times a day (QID) | ORAL | 0 refills | Status: DC | PRN
Start: 2020-10-01 — End: 2020-10-09

## 2020-10-01 NOTE — Progress Notes (Signed)
Post-Op Visit Note   Patient: Justin Landry           Date of Birth: 06-29-1961           MRN: [id number] Visit Date: 10/01/2020 PCP: Rolm Gala, NP   Assessment & Plan:  Chief Complaint:  Chief Complaint  Patient presents with  . Right Shoulder - Routine Post Op   Visit Diagnoses:  1. History of arthroplasty of right shoulder     Plan: Patient is a 59 year old male presents s/p right reverse shoulder arthroplasty on 09/18/2020.  Patient states that he has moderate to severe pain.  He is having difficulty sleeping.  He is using sling on his operative shoulder.  He is using CPM machine as directed and is up to 90 degrees on the machine.  On exam he has 30 degrees external rotation, 80 degrees abduction, 90 degrees forward flexion and subscap is working well with decent strength.  Axillary nerve is firing.  Radiographs of the right shoulder show a right shoulder prosthesis in good position alignment without complicating features.  Plan to start physical therapy at [facility].  Refill pain medicine.  Follow-up in 4 weeks with Dr. August Saucer for clinical recheck.  Follow-Up Instructions: No follow-ups on file.   Orders:  Orders Placed This Encounter  Procedures  . XR Shoulder Right  . Ambulatory referral to Physical Therapy   Meds ordered this encounter  Medications  . oxyCODONE-acetaminophen (PERCOCET) 5-325 MG tablet    Sig: Take 1 tablet by mouth every 6 (six) hours as needed for severe pain.    Dispense:  30 tablet    Refill:  0    Imaging: No results found.  PMFS History: Patient Active Problem List   Diagnosis Date Noted  . Pre-operative clearance 09/16/2020  . Abnormal EKG 09/16/2020  . Chronic left shoulder pain 04/10/2020  . Toe pain 02/21/2020  . Anxiety 02/08/2020  . Left knee pain 02/07/2020  . Inguinodynia, right 11/13/2019  . Elevated lipids 10/04/2019  . Diabetic ulcer of toe of left foot associated with diabetes mellitus due to  underlying condition (HCC) 08/23/2019  . History of right inguinal hernia 08/14/2019  . Encounter to establish care 08/14/2019  . Anxiety 08/14/2019  . Right shoulder pain 08/14/2019  . Cellulitis 06/22/2019  . Essential hypertension 05/27/2015  . Type 2 diabetes mellitus without complication (HCC) 05/27/2015  . Diverticulosis of large intestine without hemorrhage 10/16/2014  . Recurrent right inguinal hernia 09/27/2014  . Tobacco use 08/01/2014  . Hematuria, gross 07/03/2014  . Lower urinary tract symptoms (LUTS) 07/03/2014  . Diabetes mellitus (HCC) 06/25/2014  . Inguinal hernia, bilateral  04/10/2014   Past Medical History:  Diagnosis Date  . Diabetes mellitus   . Headache    migraines  . High cholesterol   . Humerus fracture    right  . Hypertension   . Inguinal hernia   . Neuromuscular disorder (HCC)    neuropathy    Family History  Problem Relation Age of Onset  . Diabetes Mother   . Hypertension Mother   . Diabetes Father   . Hypertension Father   . Cancer Brother        brain    Past Surgical History:  Procedure Laterality Date  . AMPUTATION TOE Left 06/23/2019   Procedure: AMPUTATION TOE LEFT AMPUTATION;  Surgeon: Linus Galas, DPM;  Location: [facility];  Service: Podiatry;  Laterality: Left;  . AMPUTATION TOE Left 08/31/2019   Procedure: AMPUTATION TOE  IPJ [id number];  Surgeon: Linus Galas, DPM;  Location: [facility];  Service: Podiatry;  Laterality: Left;  . CYSTOSCOPY  2015   Biopsy  . HERNIA REPAIR Right 2008   [facility]  . REVERSE SHOULDER ARTHROPLASTY Right 09/18/2020   Procedure: RIGHT REVERSE SHOULDER ARTHROPLASTY;  Surgeon: Cammy Copa, MD;  [facility];  Service: Orthopedics;  Laterality: Right;   Social History   Occupational History  . Occupation: unemployed  Tobacco Use  . Smoking status: Current Every Day Smoker    Packs/day: 0.25    Types: Cigarettes  . Smokeless tobacco: Never Used  . Tobacco comment: cut down to quarter pack a  day, aware of resources  Vaping Use  . Vaping Use: Never used  Substance and Sexual Activity  . Alcohol use: Yes    Alcohol/week: 2.0 standard drinks    Types: 2 Cans of beer per week    Comment: occasional  . Drug use: Yes    Types: Marijuana    Comment: daily - for neuropathy  . Sexual activity: Yes

## 2020-10-02 ENCOUNTER — Ambulatory Visit: Payer: Self-pay

## 2020-10-04 ENCOUNTER — Encounter: Payer: Self-pay | Admitting: Orthopedic Surgery

## 2020-10-06 ENCOUNTER — Other Ambulatory Visit: Payer: Self-pay | Admitting: Surgical

## 2020-10-06 NOTE — Telephone Encounter (Signed)
Please advise 

## 2020-10-23 ENCOUNTER — Ambulatory Visit: Payer: Self-pay | Admitting: Podiatry

## 2020-10-28 ENCOUNTER — Telehealth: Payer: Self-pay

## 2020-10-28 NOTE — Telephone Encounter (Signed)
Please advise. Thanks.  

## 2020-10-28 NOTE — Telephone Encounter (Signed)
Patient called in saying muscle is burning , wants to be seen sooner than 12/01 or wants to be prescribed something .

## 2020-10-29 NOTE — Telephone Encounter (Signed)
Okay for earlier appointment.  Last appointment was on 10/20 and he should be seen around 4 weeks after that so in the next week should be okay

## 2020-10-29 NOTE — Telephone Encounter (Signed)
Tried calling patient. No answer Ok to schedule as work-in with Dr August Saucer sooner than 12/01

## 2020-10-30 ENCOUNTER — Encounter: Payer: Self-pay | Admitting: Gerontology

## 2020-10-30 ENCOUNTER — Ambulatory Visit: Payer: Self-pay | Admitting: Gerontology

## 2020-10-30 ENCOUNTER — Other Ambulatory Visit: Payer: Self-pay | Admitting: Gerontology

## 2020-10-30 ENCOUNTER — Other Ambulatory Visit: Payer: Self-pay

## 2020-10-30 VITALS — BP 125/84 | HR 90 | Wt 213.3 lb

## 2020-10-30 DIAGNOSIS — G8929 Other chronic pain: Secondary | ICD-10-CM

## 2020-10-30 DIAGNOSIS — I10 Essential (primary) hypertension: Secondary | ICD-10-CM

## 2020-10-30 DIAGNOSIS — Z0000 Encounter for general adult medical examination without abnormal findings: Secondary | ICD-10-CM

## 2020-10-30 DIAGNOSIS — M25511 Pain in right shoulder: Secondary | ICD-10-CM

## 2020-10-30 DIAGNOSIS — E119 Type 2 diabetes mellitus without complications: Secondary | ICD-10-CM

## 2020-10-30 DIAGNOSIS — L602 Onychogryphosis: Secondary | ICD-10-CM

## 2020-10-30 MED ORDER — HYDRALAZINE HCL 25 MG PO TABS: 25.0000 mg | ORAL_TABLET | Freq: Three times a day (TID) | ORAL | 2 refills | Status: DC

## 2020-10-30 NOTE — Telephone Encounter (Signed)
Tried calling again. No answer.

## 2020-10-30 NOTE — Progress Notes (Signed)
Established Patient Office Visit  Subjective:  Patient ID: Justin Landry, male    DOB: May 08, 1961  Age: 59 y.o. MRN: [id number]  CC:  Chief Complaint  Patient presents with  . Shoulder Injury    feeling better post-surgery (27moago) but pain is constant.     HPI Justin Landry a 59y.o male who presents for follow up of Hypertension and Type 2 diabetes Mellitus. He verbalized checking his blood glucose and blood pressure weekly. He also verbalized performing daily foot checks. He complained over grown bilateral  toe nails and the need to see a podiatrist. His recent HgbA1C done 09/24/2020 was 7.5%.He denies hypoglycemic/hypergylcmic symptoms. He continues to work on healthy lifestyle modifications and verbalized decreasing his intake of fried and fatty foods. He had a right shoulder arthroplasty done 09/18/2020. He verbalized being satisfied with the procedure and states that it is healing very well. He complained of decreased range of motion to his right shoulder and pain 8/10. Pain is described as dull, no radiating pain, loacalized to the right shoulder. Pain is worse with performing activities such as mopping or lifting things around the house. He states that taking 5/325 percocet and Gabapentin 400 mg BID relieves his pain. He complained on experiencing intermittent numbness to his right shoulder, but denies tingling. He has an appointment with his Surgeon 11/12/2020 and is looking forward to starting physical therapy after his visit. He denies fever, chest pain or dyspnea with exertion. He states that his mood is good. Overall, he states that he is doing well and offers no further complaint.        Past Medical History:  Diagnosis Date  . Diabetes mellitus   . Headache    migraines  . High cholesterol   . Humerus fracture    right  . Hypertension   . Inguinal hernia   . [place]    ne[place]    Past Surgical History:  Procedure Laterality  Date  . AMPUTATION TOE Left 06/23/2019   Procedure: AMPUTATION TOE LEFT AMPUTATION;  Surgeon: CSharlotte Alamo DPM;  Location: [facility];  Service: Podiatry;  Laterality: Left;  . AMPUTATION TOE Left 08/31/2019   Procedure: AMPUTATION TOE  [id number] [id number]  Surgeon: CSharlotte Alamo DPM;  Location: [facility];  Service: Podiatry;  Laterality: Left;  . CYSTOSCOPY  2015   Biopsy  . [facility] Right 09/18/2020   Procedure: RIGHT REVERSE SHOULDER ARTHROPLASTY;  Surgeon: DMeredith Pel MD;  [place]  Service: [place]ics;  Laterality: Right;    Family History  Problem Relation Age of Onset  . Diabetes Mother   . Hypertension Mother   . Diabetes Father   . Hypertension Father   . Cancer Brother        brain    Social History   Socioeconomic History  . Marital status: Single    Spouse name: Not on file  . Number of children: 0  . Years of education: Not on file  . Highest education level: 8th grade  Occupational History  . Occupation: unemployed  Tobacco Use  . Smoking status: Current Every Day Smoker    Packs/day: 0.25    Types: Cigarettes  . Smokeless tobacco: Never Used  . Tobacco comment: cut down to quarter pack a day, aware of resources  Vaping Use  . Vaping Use: Never used  Substance and Sexual Activity  . Alcohol use: Yes  Alcohol/week: 2.0 standard drinks    Types: 2 Cans of beer per week    Comment: occasional  . Drug use: Yes    Types: Marijuana    Comment: daily - for neuropathy  . Sexual activity: Yes  Other Topics Concern  . Not on file  Social History Narrative   Social determinants completed 02/21/2020. [place] [employer] consent obtained. Referral for job assistance, housing, and gas card were made in [employer] [place] portal.   Social Determinants of Health   Financial Resource Strain: High Risk  . Difficulty of Paying Living Expenses: Hard  Food Insecurity: No Food Insecurity  . Worried About Charity fundraiser  in the Last Year: Never true  . Ran Out of Food in the Last Year: Never true  Transportation Needs: No Transportation Needs  . Lack of Transportation (Medical): No  . Lack of Transportation (Non-Medical): No  Physical Activity: Sufficiently Active  . Days of Exercise per Week: 7 days  . Minutes of Exercise per Session: 40 min  Stress: Stress Concern Present  . Feeling of Stress : Rather much  Social Connections: Moderately Isolated  . Frequency of Communication with Friends and Family: More than three times a week  . Frequency of Social Gatherings with Friends and Family: Three times a week  . Attends Religious Services: 1 to 4 times per year  . Active Member of Clubs or Organizations: No  . Attends Archivist Meetings: Never  . Marital Status: Never married  Intimate Partner Violence: Not At Risk  . Fear of Current or Ex-Partner: No  . Emotionally Abused: No  . Physically Abused: No  . Sexually Abused: No    Outpatient Medications Prior to Visit  Medication Sig Dispense Refill  . albuterol (VENTOLIN HFA) 108 (90 Base) MCG/ACT inhaler Inhale 1-2 puffs into the lungs every 6 (six) hours as needed for wheezing or shortness of breath.    Marland Kitchen amLODipine (NORVASC) 10 MG tablet Take 1 tablet (10 mg total) by mouth daily. 30 tablet 3  . ascorbic acid (VITAMIN C) 500 MG tablet Take 500 mg by mouth daily.     . blood glucose meter kit and supplies KIT Dispense based on patient and insurance preference. Use up to four times daily as directed. (FOR ICD-9 250.00, 250.01). 1 each 0  . celecoxib (CELEBREX) 100 MG capsule Take 1 capsule (100 mg total) by mouth 2 (two) times daily. 60 capsule 0  . Fish Oil-Cholecalciferol (FISH OIL + D3) 1000-1000 MG-UNIT CAPS Take 1 capsule by mouth daily with breakfast.     . gabapentin (NEURONTIN) 400 MG capsule Take 1 capsule (400 mg total) by mouth 3 (three) times daily. 90 capsule 3  . glipiZIDE (GLUCOTROL) 10 MG tablet Take 1 tablet (10 mg total) by  mouth 2 (two) times daily before a meal. 60 tablet 3  . Glucosamine-Chondroitin (COSAMIN DS PO) Take 1 tablet by mouth daily.    Marland Kitchen lisinopril (ZESTRIL) 40 MG tablet Take 1 tab ( 40 mg) daily 30 tablet 3  . metFORMIN (GLUCOPHAGE) 1000 MG tablet Take 1 tablet (1,000 mg total) by mouth 2 (two) times daily with a meal. 60 tablet 3  . methocarbamol (ROBAXIN) 500 MG tablet Take 1 tablet (500 mg total) by mouth every 8 (eight) hours as needed. 30 tablet 0  . oxyCODONE-acetaminophen (PERCOCET/ROXICET) 5-325 MG tablet Take 1 tablet by mouth every 6 (six) hours as needed for severe pain. 30 tablet 0  . pioglitazone (ACTOS) 15 MG  tablet Take 1 tablet (15 mg total) by mouth daily. 30 tablet 3  . rosuvastatin (CRESTOR) 5 MG tablet Take 1 tablet (5 mg total) by mouth daily at 6 PM. 30 tablet 3  . hydrALAZINE (APRESOLINE) 25 MG tablet Take 1 tablet (25 mg total) by mouth 3 (three) times daily. 90 tablet 0   No facility-administered medications prior to visit.    Allergies  Allergen Reactions  . Anacin-3 [Acetaminophen] Nausea And Vomiting  . Morphine And Related Nausea And Vomiting and Other (See Comments)    Extreme sweating   . Oxycontin [Oxycodone Hcl] Other (See Comments)    Extreme NAusea and vomitting follows (Patient Tolerates Percocet short acting)  . Penicillins Nausea And Vomiting    Has patient had a PCN reaction causing immediate rash, facial/tongue/throat swelling, SOB or lightheadedness with hypotension: No Has patient had a PCN reaction causing severe rash involving mucus membranes or skin necrosis: No Has patient had a PCN reaction that required hospitalization No Has patient had a PCN reaction occurring within the last 10 years: No If all of the above answers are "NO", then may proceed with Cephalosporin use.   . Tramadol Nausea Only    ROS Review of Systems  Constitutional: Negative.   HENT: Negative.   Eyes: Negative.   Respiratory: Negative.   Cardiovascular: Negative.     Gastrointestinal: Negative.   Musculoskeletal: Positive for myalgias (.Complained of right shoulder pain, from surgery 09/28/2020).  Skin: Negative.   Neurological: Negative.   Hematological: Negative.   Psychiatric/Behavioral: Negative.       Objective:    Physical Exam Constitutional:      Appearance: Normal appearance.  HENT:     Head: Normocephalic and atraumatic.     Nose: Nose normal.  Eyes:     Pupils: Pupils are equal, round, and reactive to light.  Cardiovascular:     Rate and Rhythm: Normal rate and regular rhythm.     Pulses: Normal pulses.     Heart sounds: Normal heart sounds.  Pulmonary:     Effort: Pulmonary effort is normal.     Breath sounds: Normal breath sounds.  Abdominal:     General: Abdomen is flat. Bowel sounds are normal.  Musculoskeletal:       Arms:     Cervical back: Normal range of motion.  Skin:    Capillary Refill: Capillary refill takes less than 2 seconds.  Neurological:     General: No focal deficit present.     Mental Status: He is alert.  Psychiatric:        Mood and Affect: Mood normal.        Behavior: Behavior normal.        Thought Content: Thought content normal.        Judgment: Judgment normal.     BP 125/84 (BP Location: Left Arm, Patient Position: Sitting, Cuff Size: Large)   Pulse 90   Wt 213 lb 4.8 oz (96.8 kg)   SpO2 98%   BMI 33.41 kg/m  Wt Readings from Last 3 Encounters:  10/30/20 213 lb 4.8 oz (96.8 kg)  09/18/20 210 lb (95.3 kg)  09/16/20 212 lb 8 oz (96.4 kg)   He was advised to continue with his weight loss regimen   Health Maintenance Due  Topic Date Due  . Hepatitis C Screening  Never done  . PNEUMOCOCCAL POLYSACCHARIDE VACCINE AGE 13-64 HIGH RISK  Never done  . OPHTHALMOLOGY EXAM  Never done  . COVID-19 Vaccine (1)  Never done  . HIV Screening  Never done  . FOOT EXAM  06/21/2020    There are no preventive care reminders to display for this patient.  No results found for: TSH Lab Results   Component Value Date   WBC 12.1 (H) 09/10/2020   HGB 15.8 09/10/2020   HCT 47.8 09/10/2020   MCV 91.6 09/10/2020   PLT 303 09/10/2020   Lab Results  Component Value Date   NA 135 09/10/2020   K 4.4 09/10/2020   CO2 22 09/10/2020   GLUCOSE 127 (H) 09/10/2020   BUN 18 09/10/2020   CREATININE 0.80 09/10/2020   BILITOT 0.5 09/10/2020   ALKPHOS 48 09/10/2020   AST 30 09/10/2020   ALT 39 09/10/2020   PROT 7.4 09/10/2020   ALBUMIN 4.2 09/10/2020   CALCIUM 9.9 09/10/2020   ANIONGAP 10 09/10/2020   GFR 87.21 05/27/2015   Lab Results  Component Value Date   CHOL 151 06/25/2020   Lab Results  Component Value Date   HDL 35 (L) 06/25/2020   Lab Results  Component Value Date   LDLCALC 65 06/25/2020   Lab Results  Component Value Date   TRIG 322 (H) 06/25/2020   Lab Results  Component Value Date   CHOLHDL 4.3 06/25/2020   Lab Results  Component Value Date   HGBA1C 7.5 (H) 09/24/2020      Assessment & Plan:   1. Essential hypertension His blood pressure is under control. He will continue with current treatment regimen - hydrALAZINE (APRESOLINE) 25 MG tablet; Take 1 tablet (25 mg total) by mouth 3 (three) times daily.  Dispense: 90 tablet; Refill: 2 He was advised to check his blood pressure daily He was encouraged to continue low salt DASH and exercise as tolerated   2. Chronic right shoulder pain He will continue current treatment regimen and follow up with Orthopedics  3. Overgrown toenails Foot screening was performed during this visit. - Ambulatory referral to Podiatry  4. Type 2 diabetes mellitus without complication, without long-term current use of insulin (HCC) His HgbA1C was 7.5%. His goal should be < 7%. He will continue with current treatment regimen He was advised to check blood glucose BID. His fasting blood glucose should be between 80-130 mg/dL and perform daily foot checks. Continue low fat/concentrated sweet diet and exercise as tolerated  -  HgB A1c; Future - Lipid panel; Future  5. Health care maintenance He verbalized intent to receive his flu vaccine during this visit and was provided with information on flu vaccine - Flu Vaccine QUAD 6+ mos PF IM (Fluarix Quad PF)     Follow-up: around 12/25/2019 for lab work and 12/31/2019 in the clinic or sooner if symptoms worsen or fail to improve.   Carney Corners, RN

## 2020-10-30 NOTE — Patient Instructions (Signed)
DASH Eating Plan DASH stands for "Dietary Approaches to Stop Hypertension." The DASH eating plan is a healthy eating plan that has been shown to reduce high blood pressure (hypertension). It may also reduce your risk for type 2 diabetes, heart disease, and stroke. The DASH eating plan may also help with weight loss. What are tips for following this plan?  General guidelines  Avoid eating more than 2,300 mg (milligrams) of salt (sodium) a day. If you have hypertension, you may need to reduce your sodium intake to 1,500 mg a day.  Limit alcohol intake to no more than 1 drink a day for nonpregnant women and 2 drinks a day for men. One drink equals 12 oz of beer, 5 oz of wine, or 1 oz of hard liquor.  Work with your health care provider to maintain a healthy body weight or to lose weight. Ask what an ideal weight is for you.  Get at least 30 minutes of exercise that causes your heart to beat faster (aerobic exercise) most days of the week. Activities may include walking, swimming, or biking.  Work with your health care provider or diet and nutrition specialist (dietitian) to adjust your eating plan to your individual calorie needs. Reading food labels   Check food labels for the amount of sodium per serving. Choose foods with less than 5 percent of the Daily Value of sodium. Generally, foods with less than 300 mg of sodium per serving fit into this eating plan.  To find whole grains, look for the word "whole" as the first word in the ingredient list. Shopping  Buy products labeled as "low-sodium" or "no salt added."  Buy fresh foods. Avoid canned foods and premade or frozen meals. Cooking  Avoid adding salt when cooking. Use salt-free seasonings or herbs instead of table salt or sea salt. Check with your health care provider or pharmacist before using salt substitutes.  Do not fry foods. Cook foods using healthy methods such as baking, boiling, grilling, and broiling instead.  Cook with  heart-healthy oils, such as olive, canola, soybean, or sunflower oil. Meal planning  Eat a balanced diet that includes: ? 5 or more servings of fruits and vegetables each day. At each meal, try to fill half of your plate with fruits and vegetables. ? Up to 6-8 servings of whole grains each day. ? Less than 6 oz of lean meat, poultry, or fish each day. A 3-oz serving of meat is about the same size as a deck of cards. One egg equals 1 oz. ? 2 servings of low-fat dairy each day. ? A serving of nuts, seeds, or beans 5 times each week. ? Heart-healthy fats. Healthy fats called Omega-3 fatty acids are found in foods such as flaxseeds and coldwater fish, like sardines, salmon, and mackerel.  Limit how much you eat of the following: ? Canned or prepackaged foods. ? Food that is high in trans fat, such as fried foods. ? Food that is high in saturated fat, such as fatty meat. ? Sweets, desserts, sugary drinks, and other foods with added sugar. ? Full-fat dairy products.  Do not salt foods before eating.  Try to eat at least 2 vegetarian meals each week.  Eat more home-cooked food and less restaurant, buffet, and fast food.  When eating at a restaurant, ask that your food be prepared with less salt or no salt, if possible. What foods are recommended? The items listed may not be a complete list. Talk with your dietitian about   what dietary choices are best for you. Grains Whole-grain or whole-wheat bread. Whole-grain or whole-wheat pasta. Brown rice. Oatmeal. Quinoa. Bulgur. Whole-grain and low-sodium cereals. Pita bread. Low-fat, low-sodium crackers. Whole-wheat flour tortillas. Vegetables Fresh or frozen vegetables (raw, steamed, roasted, or grilled). Low-sodium or reduced-sodium tomato and vegetable juice. Low-sodium or reduced-sodium tomato sauce and tomato paste. Low-sodium or reduced-sodium canned vegetables. Fruits All fresh, dried, or frozen fruit. Canned fruit in natural juice (without  added sugar). Meat and other protein foods Skinless chicken or turkey. Ground chicken or turkey. Pork with fat trimmed off. Fish and seafood. Egg whites. Dried beans, peas, or lentils. Unsalted nuts, nut butters, and seeds. Unsalted canned beans. Lean cuts of beef with fat trimmed off. Low-sodium, lean deli meat. Dairy Low-fat (1%) or fat-free (skim) milk. Fat-free, low-fat, or reduced-fat cheeses. Nonfat, low-sodium ricotta or cottage cheese. Low-fat or nonfat yogurt. Low-fat, low-sodium cheese. Fats and oils Soft margarine without trans fats. Vegetable oil. Low-fat, reduced-fat, or light mayonnaise and salad dressings (reduced-sodium). Canola, safflower, olive, soybean, and sunflower oils. Avocado. Seasoning and other foods Herbs. Spices. Seasoning mixes without salt. Unsalted popcorn and pretzels. Fat-free sweets. What foods are not recommended? The items listed may not be a complete list. Talk with your dietitian about what dietary choices are best for you. Grains Baked goods made with fat, such as croissants, muffins, or some breads. Dry pasta or rice meal packs. Vegetables Creamed or fried vegetables. Vegetables in a cheese sauce. Regular canned vegetables (not low-sodium or reduced-sodium). Regular canned tomato sauce and paste (not low-sodium or reduced-sodium). Regular tomato and vegetable juice (not low-sodium or reduced-sodium). Pickles. Olives. Fruits Canned fruit in a light or heavy syrup. Fried fruit. Fruit in cream or butter sauce. Meat and other protein foods Fatty cuts of meat. Ribs. Fried meat. Bacon. Sausage. Bologna and other processed lunch meats. Salami. Fatback. Hotdogs. Bratwurst. Salted nuts and seeds. Canned beans with added salt. Canned or smoked fish. Whole eggs or egg yolks. Chicken or turkey with skin. Dairy Whole or 2% milk, cream, and half-and-half. Whole or full-fat cream cheese. Whole-fat or sweetened yogurt. Full-fat cheese. Nondairy creamers. Whipped toppings.  Processed cheese and cheese spreads. Fats and oils Butter. Stick margarine. Lard. Shortening. Ghee. Bacon fat. Tropical oils, such as coconut, palm kernel, or palm oil. Seasoning and other foods Salted popcorn and pretzels. Onion salt, garlic salt, seasoned salt, table salt, and sea salt. Worcestershire sauce. Tartar sauce. Barbecue sauce. Teriyaki sauce. Soy sauce, including reduced-sodium. Steak sauce. Canned and packaged gravies. Fish sauce. Oyster sauce. Cocktail sauce. Horseradish that you find on the shelf. Ketchup. Mustard. Meat flavorings and tenderizers. Bouillon cubes. Hot sauce and Tabasco sauce. Premade or packaged marinades. Premade or packaged taco seasonings. Relishes. Regular salad dressings. Where to find more information:  National Heart, Lung, and Blood Institute: www.nhlbi.nih.gov  American Heart Association: www.heart.org Summary  The DASH eating plan is a healthy eating plan that has been shown to reduce high blood pressure (hypertension). It may also reduce your risk for type 2 diabetes, heart disease, and stroke.  With the DASH eating plan, you should limit salt (sodium) intake to 2,300 mg a day. If you have hypertension, you may need to reduce your sodium intake to 1,500 mg a day.  When on the DASH eating plan, aim to eat more fresh fruits and vegetables, whole grains, lean proteins, low-fat dairy, and heart-healthy fats.  Work with your health care provider or diet and nutrition specialist (dietitian) to adjust your eating plan to your   individual calorie needs. This information is not intended to replace advice given to you by your health care provider. Make sure you discuss any questions you have with your health care provider. Document Revised: 11/11/2017 Document Reviewed: 11/22/2016 Elsevier Patient Education  2020 [employer]. Carbohydrate Counting for Diabetes Mellitus, Adult  Carbohydrate counting is a method of keeping track of how many carbohydrates you  eat. Eating carbohydrates naturally increases the amount of sugar (glucose) in the blood. Counting how many carbohydrates you eat helps keep your blood glucose within normal limits, which helps you manage your diabetes (diabetes mellitus). It is important to know how many carbohydrates you can safely have in each meal. This is different for every person. A diet and nutrition specialist (registered dietitian) can help you make a meal plan and calculate how many carbohydrates you should have at each meal and snack. Carbohydrates are found in the following foods:  Grains, such as breads and cereals.  Dried beans and soy products.  Starchy vegetables, such as potatoes, peas, and corn.  Fruit and fruit juices.  Milk and yogurt.  Sweets and snack foods, such as cake, cookies, candy, chips, and soft drinks. How do I count carbohydrates? There are two ways to count carbohydrates in food. You can use either of the methods or a combination of both. Reading "Nutrition Facts" on packaged food The "Nutrition Facts" list is included on the labels of almost all packaged foods and beverages in the [place] It includes:  The serving size.  Information about nutrients in each serving, including the grams (g) of carbohydrate per serving. To use the "Nutrition Facts":  Decide how many servings you will have.  Multiply the number of servings by the number of carbohydrates per serving.  The resulting number is the total amount of carbohydrates that you will be having. Learning standard serving sizes of other foods When you eat carbohydrate foods that are not packaged or do not include "Nutrition Facts" on the label, you need to measure the servings in order to count the amount of carbohydrates:  Measure the foods that you will eat with a food scale or measuring cup, if needed.  Decide how many standard-size servings you will eat.  Multiply the number of servings by 15. Most carbohydrate-rich foods have  about 15 g of carbohydrates per serving. ? For example, if you eat 8 oz (170 g) of strawberries, you will have eaten 2 servings and 30 g of carbohydrates (2 servings x 15 g = 30 g).  For foods that have more than one food mixed, such as soups and casseroles, you must count the carbohydrates in each food that is included. The following list contains standard serving sizes of common carbohydrate-rich foods. Each of these servings has about 15 g of carbohydrates:   hamburger bun or  English muffin.   oz (15 mL) syrup.   oz (14 g) jelly.  1 slice of bread.  1 six-inch tortilla.  3 oz (85 g) cooked rice or pasta.  4 oz (113 g) cooked dried beans.  4 oz (113 g) starchy vegetable, such as peas, corn, or potatoes.  4 oz (113 g) hot cereal.  4 oz (113 g) mashed potatoes or  of a large baked potato.  4 oz (113 g) canned or frozen fruit.  4 oz (120 mL) fruit juice.  4-6 crackers.  6 chicken nuggets.  6 oz (170 g) unsweetened dry cereal.  6 oz (170 g) plain fat-free yogurt or yogurt sweetened with   artificial sweeteners.  8 oz (240 mL) milk.  8 oz (170 g) fresh fruit or one small piece of fruit.  24 oz (680 g) popped popcorn. Example of carbohydrate counting Sample meal  3 oz (85 g) chicken breast.  6 oz (170 g) brown rice.  4 oz (113 g) corn.  8 oz (240 mL) milk.  8 oz (170 g) strawberries with sugar-free whipped topping. Carbohydrate calculation 1. Identify the foods that contain carbohydrates: ? Rice. ? Corn. ? Milk. ? Strawberries. 2. Calculate how many servings you have of each food: ? 2 servings rice. ? 1 serving corn. ? 1 serving milk. ? 1 serving strawberries. 3. Multiply each number of servings by 15 g: ? 2 servings rice x 15 g = 30 g. ? 1 serving corn x 15 g = 15 g. ? 1 serving milk x 15 g = 15 g. ? 1 serving strawberries x 15 g = 15 g. 4. Add together all of the amounts to find the total grams of carbohydrates eaten: ? 30 g + 15 g + 15 g + 15  g = 75 g of carbohydrates total. Summary  Carbohydrate counting is a method of keeping track of how many carbohydrates you eat.  Eating carbohydrates naturally increases the amount of sugar (glucose) in the blood.  Counting how many carbohydrates you eat helps keep your blood glucose within normal limits, which helps you manage your diabetes.  A diet and nutrition specialist (registered dietitian) can help you make a meal plan and calculate how many carbohydrates you should have at each meal and snack. This information is not intended to replace advice given to you by your health care provider. Make sure you discuss any questions you have with your health care provider. Document Revised: 06/23/2017 Document Reviewed: 05/12/2016 Elsevier Patient Education  2020 Elsevier Inc.  

## 2020-11-12 ENCOUNTER — Telehealth: Payer: Self-pay | Admitting: Orthopedic Surgery

## 2020-11-12 ENCOUNTER — Other Ambulatory Visit: Payer: Self-pay | Admitting: Family

## 2020-11-12 ENCOUNTER — Other Ambulatory Visit: Payer: Self-pay

## 2020-11-12 ENCOUNTER — Ambulatory Visit (INDEPENDENT_AMBULATORY_CARE_PROVIDER_SITE_OTHER): Payer: Self-pay | Admitting: Orthopedic Surgery

## 2020-11-12 DIAGNOSIS — M79674 Pain in right toe(s): Secondary | ICD-10-CM

## 2020-11-12 DIAGNOSIS — Z96611 Presence of right artificial shoulder joint: Secondary | ICD-10-CM

## 2020-11-12 MED ORDER — DOXYCYCLINE HYCLATE 50 MG PO CAPS: 100.0000 mg | ORAL_CAPSULE | Freq: Two times a day (BID) | ORAL | 0 refills | Status: DC

## 2020-11-12 MED ORDER — SILVER SULFADIAZINE 1 % EX CREA: 1.0000 "application " | TOPICAL_CREAM | Freq: Every day | CUTANEOUS | 0 refills | Status: DC

## 2020-11-12 MED ORDER — CYCLOBENZAPRINE HCL 10 MG PO TABS: 10.0000 mg | ORAL_TABLET | Freq: Three times a day (TID) | ORAL | 0 refills | Status: DC | PRN

## 2020-11-12 MED ORDER — ACETAMINOPHEN-CODEINE #3 300-30 MG PO TABS: 1.0000 | ORAL_TABLET | Freq: Every day | ORAL | 0 refills | Status: DC | PRN

## 2020-11-12 NOTE — Telephone Encounter (Signed)
*   Called patient per Denny Peon to reschedule the 11/21/2020 appointment with Dr Lajoyce Corners. Patient need a earlier appointment  Monday or Tuesday pm.  Note: Got recording call cannot be completed at this time please hangup and try call again later

## 2020-11-13 ENCOUNTER — Telehealth: Payer: Self-pay | Admitting: Orthopedic Surgery

## 2020-11-13 NOTE — Telephone Encounter (Signed)
Called patient got recording call can not be completed at this time   Please hangup and try your again later       Patient need to reschedule his 11/21/2020 appointment with Dr Lajoyce Corners     Monday or Tuesday

## 2020-11-13 NOTE — Telephone Encounter (Signed)
Called patient's sister Tora Duck she advised patient's phone is not working. Sheryl advised she will have patient call me back.

## 2020-11-16 ENCOUNTER — Encounter: Payer: Self-pay | Admitting: Orthopedic Surgery

## 2020-11-16 NOTE — Progress Notes (Signed)
Post-Op Visit Note   Patient: Justin Landry           Date of Birth: [birth year]           MRN: [id number] Visit Date: 11/12/2020 PCP: Rolm Gala, NP   Assessment & Plan:  Chief Complaint:  Chief Complaint  Patient presents with  . Right Shoulder - Routine Post Op   Visit Diagnoses: No diagnosis found.  Plan: Patient is a 59 year old male who presents s/p right reverse shoulder arthroplasty on 09/18/2020.  He states that his shoulder feels well but he does note that he has a lot of muscle pain with lifting the arm.  He denies any fevers, chills, night sweats, drainage.  No episodes of instability.  He is still living with his roommate.  He does note muscle spasm throughout the right deltoid muscle.  He is sleeping okay and feels that his pain is improved compared with preoperatively.  He is doing his own physical therapy, working on the range of motion and strength of the shoulder.  He understands there is a 15 pound limit on how much she can lift with this shoulder.  On exam he is 50 degrees external rotation, 95 degrees abduction, 140 degrees forward flexion.  Incision is healing well without any evidence of dehiscence or infection.  Excellent strength of the subscapularis.  Overall he is progressing well and plan to follow-up in 6 weeks for clinical recheck and likely final check.  Additionally, he raised concern over a left great toe wound.  He has a history of left great toe partial amputation.  He does have a history of diabetes.  He has bruising throughout the left great toe as well as bloody drainage that does not appear purulent.  After evaluation by Dr. Lajoyce Corners in the clinic today, he is concerned for left great toe infection.  Plan for doxycycline today for the next several days.  Plan for follow-up appointment in 1 week.   Follow-Up Instructions: No follow-ups on file.   Orders:  No orders of the defined types were placed in this encounter.  Meds ordered this  encounter  Medications  . cyclobenzaprine (FLEXERIL) 10 MG tablet    Sig: Take 1 tablet (10 mg total) by mouth 3 (three) times daily as needed for muscle spasms.    Dispense:  30 tablet    Refill:  0  . acetaminophen-codeine (TYLENOL #3) 300-30 MG tablet    Sig: Take 1 tablet by mouth daily as needed.    Dispense:  20 tablet    Refill:  0  . doxycycline (VIBRAMYCIN) 50 MG capsule    Sig: Take 2 capsules (100 mg total) by mouth 2 (two) times daily.    Dispense:  30 capsule    Refill:  0    Imaging: No results found.  PMFS History: Patient Active Problem List   Diagnosis Date Noted  . Overgrown toenails 10/30/2020  . Pre-operative clearance 09/16/2020  . Abnormal EKG 09/16/2020  . Chronic left shoulder pain 04/10/2020  . Toe pain 02/21/2020  . Anxiety 02/08/2020  . Left knee pain 02/07/2020  . Inguinodynia, right 11/13/2019  . Elevated lipids 10/04/2019  . 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 History of right inguinal hernia 08/14/2019  . Encounter to establish care 08/14/2019  . Anxiety 08/14/2019  . Right shoulder pain 08/14/2019  . Cellulitis 06/22/2019  . Essential hypertension 05/27/2015  .  Type 2 diabetes mellitus without complication (HCC) 05/27/2015  . Diverticulosis of large intestine without hemorrhage 10/16/2014  . Recurrent right inguinal hernia 09/27/2014  . Tobacco use 08/01/2014  . Hematuria, gross 07/03/2014  . Lower urinary tract symptoms (LUTS) 07/03/2014  . Diabetes mellitus (HCC) 06/25/2014  . Inguinal hernia, bilateral  04/10/2014   Past Medical History:  Diagnosis Date  . Diabetes mellitus   . Headache    migraines  . High cholesterol   . Humerus fracture    right  . Hypertension   . Inguinal hernia   . Neuromuscular disorder (HCC)    neuropathy    Family History  Problem Relation Age of Onset  . Diabetes Mother   . Hypertension Mother   . Diabetes Father    . Hypertension Father   . Cancer Brother        brain    Past Surgical History:  Procedure Laterality Date  . AMPUTATION TOE Left 06/23/2019   Procedure: AMPUTATION TOE LEFT AMPUTATION;  Surgeon: Linus Galas, DPM;  Location: [facility] ORS;  Service: Podiatry;  Laterality: Left;  . AMPUTATION TOE Left 08/31/2019   Procedure: AMPUTATION TOE  [id number];  Surgeon: Linus Galas, DPM;  Location: [facility] ORS;  Service: Podiatry;  Laterality: Left;  . CYSTOSCOPY  2015   Biopsy  . HERNIA REPAIR Right 2008   [facility]  . REVERSE SHOULDER ARTHROPLASTY Right 09/18/2020   Procedure: RIGHT REVERSE SHOULDER ARTHROPLASTY;  Surgeon: Cammy Copa, MD;  Location: [facility] OR;  Service: Orthopedics;  Laterality: Right;   Social History   Occupational History  . Occupation: unemployed  Tobacco Use  . Smoking status: Current Every Day Smoker    Packs/day: 0.25    Types: Cigarettes  . Smokeless tobacco: Never Used  . Tobacco comment: cut down to quarter pack a day, aware of resources  Vaping Use  . Vaping Use: Never used  Substance and Sexual Activity  . Alcohol use: Yes    Alcohol/week: 2.0 standard drinks    Types: 2 Cans of beer per week    Comment: occasional  . Drug use: Yes    Types: Marijuana    Comment: daily - for neuropathy  . Sexual activity: Yes

## 2020-11-17 ENCOUNTER — Ambulatory Visit (INDEPENDENT_AMBULATORY_CARE_PROVIDER_SITE_OTHER): Payer: Medicaid Other | Admitting: Orthopedic Surgery

## 2020-11-17 ENCOUNTER — Encounter: Payer: Self-pay | Admitting: Orthopedic Surgery

## 2020-11-17 ENCOUNTER — Other Ambulatory Visit: Payer: Self-pay

## 2020-11-17 ENCOUNTER — Ambulatory Visit (INDEPENDENT_AMBULATORY_CARE_PROVIDER_SITE_OTHER): Payer: Medicaid Other

## 2020-11-17 VITALS — Ht 67.0 in | Wt 213.0 lb

## 2020-11-17 DIAGNOSIS — L97521 Non-pressure chronic ulcer of other part of left foot limited to breakdown of skin: Secondary | ICD-10-CM

## 2020-11-17 DIAGNOSIS — M79672 Pain in left foot: Secondary | ICD-10-CM | POA: Diagnosis not present

## 2020-11-17 NOTE — Progress Notes (Signed)
Office Visit Note   Patient: Justin Landry           Date of Birth: 08-07-1961           MRN: [id number] Visit Date: 11/17/2020              Requested by: Rolm Gala, NP [address] [place],  [place] [address] PCP: Rolm Gala, NP  Chief Complaint  Patient presents with  . Left Foot - Pain    Left great toe. Referral from Dr.Dean      HPI: Patient is a 59 year old gentleman who is seen for initial evaluation for ulceration left great toe he is status post resection of the great toe through the IP joint and resection of the second toe through the PIP joint.  Patient states that he was hunting and wet moist terrain and developed an ulcer from his new boots.  Assessment & Plan: Visit Diagnoses:  1. Pain in left foot   2. Ulcer of great toe, left, limited to breakdown of skin (HCC)     Plan: Patient will wash this with soap and water daily apply dry dressing reevaluate in 1 week.  Follow-Up Instructions: Return in about 1 week (around 11/24/2020).   Ortho Exam  Patient is alert, oriented, no adenopathy, well-dressed, normal affect, normal respiratory effort. Examination patient has swelling of the left foot.  A Doppler was used and he has a strong biphasic dorsalis pedis and posterior tibial pulse no evidence of arterial insufficiency.  Patient has a macerated ulcer over the left great toe after informed consent a 10 blade knife was used to debride the skin and soft tissue back to healthy viable tissue the ulcer was 1 cm diameter prior to debridement 3 cm in diameter after debridement and 2 mm deep.  The surrounding skin is macerated there is no cellulitis no sausage digit swelling no drainage no signs of any chronic infection this appears to be an acute skin breakdown from shoewear and a wet environment.  Imaging: XR Foot 2 Views Left  Result Date: 11/17/2020 2 view radiographs of the left foot shows previous amputation of the great  toe through the proximal phalanx as well as an amputation of the second toe through the PIP joint.  No destructive lytic bony changes.  No images are attached to the encounter.  Labs: Lab Results  Component Value Date   HGBA1C 7.5 (H) 09/24/2020   HGBA1C 7.2 (H) 09/10/2020   HGBA1C 7.6 (H) 06/25/2020   REPTSTATUS 09/11/2020 FINAL 09/10/2020   GRAMSTAIN  06/23/2019    FEW WBC PRESENT, PREDOMINANTLY PMN RARE GRAM POSITIVE COCCI    CULT (A) 09/10/2020    <10,000 COLONIES/mL INSIGNIFICANT GROWTH Performed at [facility], [address] [place], [place] [address]    LABORGA LECLERCIA ADECARBOXYLATA 06/23/2019   LABORGA STAPHYLOCOCCUS CAPRAE 06/23/2019   LABORGA STAPHYLOCOCCUS SIMULANS 06/23/2019     Lab Results  Component Value Date   ALBUMIN 4.2 09/10/2020   ALBUMIN 4.5 07/14/2020   ALBUMIN 4.5 06/25/2020    No results found for: MG No results found for: VD25OH  No results found for: PREALBUMIN CBC EXTENDED Latest Ref Rng & Units 09/10/2020 07/14/2020 06/25/2020  WBC 4.0 - 10.5 K/uL 12.1(H) 10.1 11.7(H)  RBC 4.22 - 5.81 MIL/uL 5.22 5.15 5.20  HGB 13.0 - 17.0 g/dL 62.7 03.5 00.9  HCT 39 - 52 % 47.8 45.6 48.0  PLT 150 - 400 K/uL 303 342  287  NEUTROABS 1.7 - 7.7 K/uL - 6.3 8.3(H)  LYMPHSABS 0.7 - 4.0 K/uL - 2.5 2.3     Body mass index is 33.36 kg/m.  Orders:  Orders Placed This Encounter  Procedures  . XR Foot 2 Views Left   No orders of the defined types were placed in this encounter.    Procedures: No procedures performed  Clinical Data: No additional findings.  ROS:  All other systems negative, except as noted in the HPI. Review of Systems  Objective: Vital Signs: Ht 5\' 7"  (1.702 m)   Wt 213 lb (96.6 kg)   BMI 33.36 kg/m   Specialty Comments:  No specialty comments available.  PMFS History: Patient Active Problem List   Diagnosis Date Noted  . Overgrown toenails 10/30/2020  . Pre-operative clearance 09/16/2020  . Abnormal EKG  09/16/2020  . Chronic left shoulder pain 04/10/2020  . Toe pain 02/21/2020  . Anxiety 02/08/2020  . Left knee pain 02/07/2020  . Inguinodynia, right 11/13/2019  . Elevated lipids 10/04/2019  . 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 History of right inguinal hernia 08/14/2019  . Encounter to establish care 08/14/2019  . Anxiety 08/14/2019  . Right shoulder pain 08/14/2019  . Cellulitis 06/22/2019  . Essential hypertension 05/27/2015  . Type 2 diabetes mellitus without complication (HCC) 05/27/2015  . Diverticulosis of large intestine without hemorrhage 10/16/2014  . Recurrent right inguinal hernia 09/27/2014  . Tobacco use 08/01/2014  . Hematuria, gross 07/03/2014  . Lower urinary tract symptoms (LUTS) 07/03/2014  . Diabetes mellitus (HCC) 06/25/2014  . Inguinal hernia, bilateral  04/10/2014   Past Medical History:  Diagnosis Date  . Diabetes mellitus   . Headache    migraines  . High cholesterol   . Humerus fracture    right  . Hypertension   . Inguinal hernia   . Neuromuscular disorder (HCC)    neuropathy    Family History  Problem Relation Age of Onset  . Diabetes Mother   . Hypertension Mother   . Diabetes Father   . Hypertension Father   . Cancer Brother        brain    Past Surgical History:  Procedure Laterality Date  . AMPUTATION TOE Left 06/23/2019   Procedure: AMPUTATION TOE LEFT AMPUTATION;  Surgeon: 08/24/2019, DPM;  Location: [facility] ORS;  Service: Podiatry;  Laterality: Left;  . AMPUTATION TOE Left 08/31/2019   Procedure: AMPUTATION TOE  IPJ 09/02/2019;  Surgeon: [id number], DPM;  Location: [facility] ORS;  Service: Podiatry;  Laterality: Left;  . CYSTOSCOPY  2015   Biopsy  . HERNIA REPAIR Right 2008   [facility]  . REVERSE SHOULDER ARTHROPLASTY Right 09/18/2020   Procedure: RIGHT REVERSE SHOULDER ARTHROPLASTY;  Surgeon: 11/18/2020, MD;  Location: [facility] OR;  Service: Orthopedics;   Laterality: Right;   Social History   Occupational History  . Occupation: unemployed  Tobacco Use  . Smoking status: Current Every Day Smoker    Packs/day: 0.25    Types: Cigarettes  . Smokeless tobacco: Never Used  . Tobacco comment: cut down to quarter pack a day, aware of resources  Vaping Use  . Vaping Use: Never used  Substance and Sexual Activity  . Alcohol use: Yes    Alcohol/week: 2.0 standard drinks    Types: 2 Cans of beer per week    Comment: occasional  . Drug use: Yes    Types:  Marijuana    Comment: daily - for neuropathy  . Sexual activity: Yes

## 2020-11-21 ENCOUNTER — Ambulatory Visit: Payer: Self-pay | Admitting: Orthopedic Surgery

## 2020-11-24 ENCOUNTER — Other Ambulatory Visit: Payer: Self-pay

## 2020-11-24 ENCOUNTER — Encounter: Payer: Self-pay | Admitting: Orthopedic Surgery

## 2020-11-24 ENCOUNTER — Ambulatory Visit (INDEPENDENT_AMBULATORY_CARE_PROVIDER_SITE_OTHER): Payer: Medicaid Other | Admitting: Physician Assistant

## 2020-11-24 DIAGNOSIS — E08621 Diabetes mellitus due to underlying condition with foot ulcer: Secondary | ICD-10-CM | POA: Diagnosis not present

## 2020-11-24 DIAGNOSIS — L97521 Non-pressure chronic ulcer of other part of left foot limited to breakdown of skin: Secondary | ICD-10-CM | POA: Diagnosis not present

## 2020-11-24 NOTE — Progress Notes (Signed)
Office Visit Note   Patient: Justin Landry           Date of Birth: 06/30/1961           MRN: [id number] Visit Date: 11/24/2020              Requested by: Rolm Gala, NP [address] [place],  [place] [address] PCP: Rolm Gala, NP  No chief complaint on file.     HPI: Patient presents today for follow-up for his left great toe ulcer he has been washing it daily with soap and water he thinks it looks a little better  Assessment & Plan: Visit Diagnoses: No diagnosis found.  Plan: Continue daily cleansing and keeping it dry. Follow-up in 1 week  Follow-Up Instructions: No follow-ups on file.   Ortho Exam  Patient is alert, oriented, no adenopathy, well-dressed, normal affect, normal respiratory effort. Focused examination demonstrates no swelling no ascending cellulitis no foul odor minimal drainage. He does have an eschar forming across the open area.  Imaging: No results found. No images are attached to the encounter.  Labs: Lab Results  Component Value Date   HGBA1C 7.5 (H) 09/24/2020   HGBA1C 7.2 (H) 09/10/2020   HGBA1C 7.6 (H) 06/25/2020   REPTSTATUS 09/11/2020 FINAL 09/10/2020   GRAMSTAIN  06/23/2019    FEW WBC PRESENT, PREDOMINANTLY PMN RARE GRAM POSITIVE COCCI    CULT (A) 09/10/2020    <10,000 COLONIES/mL INSIGNIFICANT GROWTH Performed at [facility], [address], [place], [place] [address]    LABORGA LECLERCIA ADECARBOXYLATA 06/23/2019   LABORGA STAPHYLOCOCCUS CAPRAE 06/23/2019   LABORGA STAPHYLOCOCCUS SIMULANS 06/23/2019     Lab Results  Component Value Date   ALBUMIN 4.2 09/10/2020   ALBUMIN 4.5 07/14/2020   ALBUMIN 4.5 06/25/2020    No results found for: MG No results found for: VD25OH  No results found for: PREALBUMIN CBC EXTENDED Latest Ref Rng & Units 09/10/2020 07/14/2020 06/25/2020  WBC 4.0 - 10.5 K/uL 12.1(H) 10.1 11.7(H)  RBC 4.22 - 5.81 MIL/uL 5.22 5.15 5.20  HGB 13.0 - 17.0 g/dL  82.9 93.7 16.9  HCT 67.8 - 52.0 % 47.8 45.6 48.0  PLT 150 - 400 K/uL 303 342 287  NEUTROABS 1.7 - 7.7 K/uL - 6.3 8.3(H)  LYMPHSABS 0.7 - 4.0 K/uL - 2.5 2.3     There is no height or weight on file to calculate BMI.  Orders:  No orders of the defined types were placed in this encounter.  No orders of the defined types were placed in this encounter.    Procedures: No procedures performed  Clinical Data: No additional findings.  ROS:  All other systems negative, except as noted in the HPI. Review of Systems  Objective: Vital Signs: There were no vitals taken for this visit.  Specialty Comments:  No specialty comments available.  PMFS History: Patient Active Problem List   Diagnosis Date Noted  . Overgrown toenails 10/30/2020  . Pre-operative clearance 09/16/2020  . Abnormal EKG 09/16/2020  . Chronic left shoulder pain 04/10/2020  . Toe pain 02/21/2020  . Anxiety 02/08/2020  . Left knee pain 02/07/2020  . Inguinodynia, right 11/13/2019  . Elevated lipids 10/04/2019  . 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 History of right inguinal hernia 08/14/2019  . Encounter to establish care 08/14/2019  . Anxiety 08/14/2019  . Right shoulder pain 08/14/2019  .  Cellulitis 06/22/2019  . Essential hypertension 05/27/2015  . Type 2 diabetes mellitus without complication (HCC) 05/27/2015  . Diverticulosis of large intestine without hemorrhage 10/16/2014  . Recurrent right inguinal hernia 09/27/2014  . Tobacco use 08/01/2014  . Hematuria, gross 07/03/2014  . Lower urinary tract symptoms (LUTS) 07/03/2014  . Diabetes mellitus (HCC) 06/25/2014  . Inguinal hernia, bilateral  04/10/2014   Past Medical History:  Diagnosis Date  . Diabetes mellitus   . Headache    migraines  . High cholesterol   . Humerus fracture    right  . Hypertension   . Inguinal hernia   . Neuromuscular disorder (HCC)     neuropathy    Family History  Problem Relation Age of Onset  . Diabetes Mother   . Hypertension Mother   . Diabetes Father   . Hypertension Father   . Cancer Brother        brain    Past Surgical History:  Procedure Laterality Date  . AMPUTATION TOE Left 06/23/2019   Procedure: AMPUTATION TOE LEFT AMPUTATION;  Surgeon: Linus Galas, DPM;  Location: [facility] ORS;  Service: Podiatry;  Laterality: Left;  . AMPUTATION TOE Left 08/31/2019   Procedure: AMPUTATION TOE  IPJ 07622;  Surgeon: Linus Galas, DPM;  Location: [facility] ORS;  Service: Podiatry;  Laterality: Left;  . CYSTOSCOPY  2015   Biopsy  . HERNIA REPAIR Right 2008   [facility]  . REVERSE SHOULDER ARTHROPLASTY Right 09/18/2020   Procedure: RIGHT REVERSE SHOULDER ARTHROPLASTY;  Surgeon: Cammy Copa, MD;  Location: [facility] OR;  Service: Orthopedics;  Laterality: Right;   Social History   Occupational History  . Occupation: unemployed  Tobacco Use  . Smoking status: Current Every Day Smoker    Packs/day: 0.25    Types: Cigarettes  . Smokeless tobacco: Never Used  . Tobacco comment: cut down to quarter pack a day, aware of resources  Vaping Use  . Vaping Use: Never used  Substance and Sexual Activity  . Alcohol use: Yes    Alcohol/week: 2.0 standard drinks    Types: 2 Cans of beer per week    Comment: occasional  . Drug use: Yes    Types: Marijuana    Comment: daily - for neuropathy  . Sexual activity: Yes

## 2020-12-01 ENCOUNTER — Other Ambulatory Visit: Payer: Self-pay

## 2020-12-01 ENCOUNTER — Ambulatory Visit (INDEPENDENT_AMBULATORY_CARE_PROVIDER_SITE_OTHER): Payer: Medicaid Other | Admitting: Orthopedic Surgery

## 2020-12-01 ENCOUNTER — Encounter: Payer: Self-pay | Admitting: Orthopedic Surgery

## 2020-12-01 VITALS — Ht 67.0 in | Wt 213.0 lb

## 2020-12-01 DIAGNOSIS — M869 Osteomyelitis, unspecified: Secondary | ICD-10-CM

## 2020-12-01 NOTE — Progress Notes (Signed)
Office Visit Note   Patient: Justin Landry           Date of Birth: 1961/11/25           MRN: [id number] Visit Date: 12/01/2020              Requested by: Rolm Gala, NP [address] [place],  [place] [address] PCP: Rolm Gala, NP  Chief Complaint  Patient presents with  . 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  Patient developed an ulcer on the great toe after hunting he is currently been undergoing conservative wound management and states there has been progressive necrotic skin changes.  Assessment & Plan: Visit Diagnoses:  1. Osteomyelitis of great toe of left foot (HCC)     Plan: Due to progressive necrotic skin changes and exposed bone patient wished to proceed with amputation of the remainder of the residual left great toe he would like to set this up after Christmas we will plan for outpatient surgery at [facility].  Risks and benefits were discussed including risk of the wound not healing and need for additional surgery.  Follow-Up Instructions: Return if symptoms worsen or fail to improve, for Plan to follow-up 1 week after surgery.   Ortho Exam  Patient is alert, oriented, no adenopathy, well-dressed, normal affect, normal respiratory effort. Examination patient has a well-healed partial amputation of the second toe the skin over the residual toe of the great toe is necrotic with exposed bone.  Previous Doppler exam showed a strong biphasic dorsalis pedis and posterior tibial pulse no evidence of arterial insufficiency.  Imaging: No results found. No images are attached to the encounter.  Labs: Lab Results  Component Value Date   HGBA1C 7.5 (H) 09/24/2020   HGBA1C 7.2 (H) 09/10/2020   HGBA1C 7.6 (H) 06/25/2020   REPTSTATUS 09/11/2020 FINAL 09/10/2020   GRAMSTAIN  06/23/2019     FEW WBC PRESENT, PREDOMINANTLY PMN RARE GRAM POSITIVE COCCI    CULT (A) 09/10/2020    <10,000 COLONIES/mL INSIGNIFICANT GROWTH Performed at [facility], [address], [place], [place] [address]    LABORGA LECLERCIA ADECARBOXYLATA 06/23/2019   LABORGA STAPHYLOCOCCUS CAPRAE 06/23/2019   LABORGA STAPHYLOCOCCUS SIMULANS 06/23/2019     Lab Results  Component Value Date   ALBUMIN 4.2 09/10/2020   ALBUMIN 4.5 07/14/2020   ALBUMIN 4.5 06/25/2020    No results found for: MG No results found for: VD25OH  No results found for: PREALBUMIN CBC EXTENDED Latest Ref Rng & Units 09/10/2020 07/14/2020 06/25/2020  WBC 4.0 - 10.5 K/uL 12.1(H) 10.1 11.7(H)  RBC 4.22 - 5.81 MIL/uL 5.22 5.15 5.20  HGB 13.0 - 17.0 g/dL 60.7 37.1 06.2  HCT 69.4 - 52.0 % 47.8 45.6 48.0  PLT 150 - 400 K/uL 303 342 287  NEUTROABS 1.7 - 7.7 K/uL - 6.3 8.3(H)  LYMPHSABS 0.7 - 4.0 K/uL - 2.5 2.3     Body mass index is 33.36 kg/m.  Orders:  No orders of the defined types were placed in this encounter.  No orders of the defined types were placed in this encounter.    Procedures: No procedures performed  Clinical Data: No additional findings.  ROS:  All other systems negative, except as noted in the HPI. Review of Systems  Objective:  Vital Signs: Ht 5\' 7"  (1.702 m)   Wt 213 lb (96.6 kg)   BMI 33.36 kg/m   Specialty Comments:  No specialty comments available.  PMFS History: Patient Active Problem List   Diagnosis Date Noted  . Overgrown toenails 10/30/2020  . Pre-operative clearance 09/16/2020  . Abnormal EKG 09/16/2020  . Chronic left shoulder pain 04/10/2020  . Toe pain 02/21/2020  . Anxiety 02/08/2020  . Left knee pain 02/07/2020  . Inguinodynia, right 11/13/2019  . Elevated lipids 10/04/2019  . 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 History of right inguinal hernia 08/14/2019  . Encounter to establish  care 08/14/2019  . Anxiety 08/14/2019  . Right shoulder pain 08/14/2019  . Cellulitis 06/22/2019  . Essential hypertension 05/27/2015  . Type 2 diabetes mellitus without complication (HCC) 05/27/2015  . Diverticulosis of large intestine without hemorrhage 10/16/2014  . Recurrent right inguinal hernia 09/27/2014  . Tobacco use 08/01/2014  . Hematuria, gross 07/03/2014  . Lower urinary tract symptoms (LUTS) 07/03/2014  . Diabetes mellitus (HCC) 06/25/2014  . Inguinal hernia, bilateral  04/10/2014   Past Medical History:  Diagnosis Date  . Diabetes mellitus   . Headache    migraines  . High cholesterol   . Humerus fracture    right  . Hypertension   . Inguinal hernia   . Neuromuscular disorder (HCC)    neuropathy    Family History  Problem Relation Age of Onset  . Diabetes Mother   . Hypertension Mother   . Diabetes Father   . Hypertension Father   . Cancer Brother        brain    Past Surgical History:  Procedure Laterality Date  . AMPUTATION TOE Left 06/23/2019   Procedure: AMPUTATION TOE LEFT AMPUTATION;  Surgeon: 08/24/2019, DPM;  Location: [facility] ORS;  Service: Podiatry;  Laterality: Left;  . AMPUTATION TOE Left 08/31/2019   Procedure: AMPUTATION TOE  IPJ 09/02/2019;  Surgeon: [id number], DPM;  Location: [facility] ORS;  Service: Podiatry;  Laterality: Left;  . CYSTOSCOPY  2015   Biopsy  . HERNIA REPAIR Right 2008   [facility]  . REVERSE SHOULDER ARTHROPLASTY Right 09/18/2020   Procedure: RIGHT REVERSE SHOULDER ARTHROPLASTY;  Surgeon: 11/18/2020, MD;  Location: [facility] OR;  Service: Orthopedics;  Laterality: Right;   Social History   Occupational History  . Occupation: unemployed  Tobacco Use  . Smoking status: Current Every Day Smoker    Packs/day: 0.25    Types: Cigarettes  . Smokeless tobacco: Never Used  . Tobacco comment: cut down to quarter pack a day, aware of resources  Vaping Use  . Vaping Use: Never used  Substance and Sexual Activity  . Alcohol use:  Yes    Alcohol/week: 2.0 standard drinks    Types: 2 Cans of beer per week    Comment: occasional  . Drug use: Yes    Types: Marijuana    Comment: daily - for neuropathy  . Sexual activity: Yes

## 2020-12-08 ENCOUNTER — Encounter (HOSPITAL_COMMUNITY): Payer: Self-pay | Admitting: Orthopedic Surgery

## 2020-12-08 ENCOUNTER — Other Ambulatory Visit: Payer: Self-pay

## 2020-12-08 ENCOUNTER — Other Ambulatory Visit: Payer: Self-pay | Admitting: Physician Assistant

## 2020-12-08 ENCOUNTER — Other Ambulatory Visit
Admission: RE | Admit: 2020-12-08 | Discharge: 2020-12-08 | Disposition: A | Discharge status: Home / Self Care | Payer: Medicaid Other | Source: Ambulatory Visit | Attending: Orthopedic Surgery | Admitting: Orthopedic Surgery

## 2020-12-08 DIAGNOSIS — Z20822 Contact with and (suspected) exposure to covid-19: Secondary | ICD-10-CM | POA: Diagnosis not present

## 2020-12-08 DIAGNOSIS — Z01812 Encounter for preprocedural laboratory examination: Secondary | ICD-10-CM | POA: Insufficient documentation

## 2020-12-08 NOTE — Progress Notes (Signed)
Patient denies chest pain or shortness of breath.   Patient is diabetic.  Covid test done 12/08/20.  Discussed need to quarantine.

## 2020-12-09 LAB — SARS CORONAVIRUS 2 (TAT 6-24 HRS): SARS Coronavirus 2: NEGATIVE

## 2020-12-10 ENCOUNTER — Ambulatory Visit: Payer: Self-pay | Admitting: Surgical

## 2020-12-10 ENCOUNTER — Encounter (HOSPITAL_COMMUNITY): Payer: Self-pay | Admitting: Orthopedic Surgery

## 2020-12-10 ENCOUNTER — Ambulatory Visit (HOSPITAL_COMMUNITY)
Admission: RE | Admit: 2020-12-10 | Discharge: 2020-12-10 | Disposition: A | Discharge status: Home / Self Care | Payer: Medicaid Other | Attending: Orthopedic Surgery | Admitting: Orthopedic Surgery

## 2020-12-10 ENCOUNTER — Other Ambulatory Visit: Payer: Self-pay

## 2020-12-10 ENCOUNTER — Ambulatory Visit (HOSPITAL_COMMUNITY): Payer: Medicaid Other | Admitting: Certified Registered"

## 2020-12-10 ENCOUNTER — Encounter (HOSPITAL_COMMUNITY)
Admission: RE | Disposition: A | Discharge status: Home / Self Care | Payer: Self-pay | Source: Home / Self Care | Attending: Orthopedic Surgery

## 2020-12-10 DIAGNOSIS — M86272 Subacute osteomyelitis, left ankle and foot: Secondary | ICD-10-CM

## 2020-12-10 DIAGNOSIS — E1169 Type 2 diabetes mellitus with other specified complication: Secondary | ICD-10-CM | POA: Diagnosis not present

## 2020-12-10 DIAGNOSIS — M869 Osteomyelitis, unspecified: Secondary | ICD-10-CM | POA: Diagnosis not present

## 2020-12-10 DIAGNOSIS — Z96611 Presence of right artificial shoulder joint: Secondary | ICD-10-CM | POA: Insufficient documentation

## 2020-12-10 DIAGNOSIS — Z880 Allergy status to penicillin: Secondary | ICD-10-CM | POA: Diagnosis not present

## 2020-12-10 DIAGNOSIS — Z885 Allergy status to narcotic agent status: Secondary | ICD-10-CM | POA: Diagnosis not present

## 2020-12-10 HISTORY — PX: AMPUTATION: SHX166

## 2020-12-10 LAB — GLUCOSE, CAPILLARY
Glucose-Capillary: 219 mg/dL — ABNORMAL HIGH (ref 70–99)
Glucose-Capillary: 185 mg/dL — ABNORMAL HIGH (ref 70–99)

## 2020-12-10 LAB — BASIC METABOLIC PANEL
Anion gap: 12 (ref 5–15)
BUN: 11 mg/dL (ref 6–20)
CO2: 20 mmol/L — ABNORMAL LOW (ref 22–32)
Calcium: 9.5 mg/dL (ref 8.9–10.3)
Chloride: 103 mmol/L (ref 98–111)
Creatinine, Ser: 0.8 mg/dL (ref 0.61–1.24)
GFR, Estimated: >60 mL/min (ref >60)
Glucose, Bld: 202 mg/dL — ABNORMAL HIGH (ref 70–99)
Potassium: 4.9 mmol/L (ref 3.5–5.1)
Sodium: 135 mmol/L (ref 135–145)

## 2020-12-10 LAB — CBC
HCT: 43.7 % (ref 39.0–52.0)
Hemoglobin: 15.4 g/dL (ref 13.0–17.0)
MCH: 31.9 pg (ref 26.0–34.0)
MCHC: 35.2 g/dL (ref 30.0–36.0)
MCV: 90.5 fL (ref 80.0–100.0)
Platelets: 367 10*3/uL (ref 150–400)
RBC: 4.83 MIL/uL (ref 4.22–5.81)
RDW: 13.4 % (ref 11.5–15.5)
WBC: 15.5 10*3/uL — ABNORMAL HIGH (ref 4.0–10.5)
nRBC: 0 % (ref 0.0–0.2)

## 2020-12-10 SURGERY — AMPUTATION DIGIT
Anesthesia: Regional | Laterality: Left

## 2020-12-10 MED ORDER — 0.9 % SODIUM CHLORIDE (POUR BTL) OPTIME
TOPICAL | Status: DC | PRN
  Administered 2020-12-10: 1000 mL

## 2020-12-10 MED ORDER — PROPOFOL 500 MG/50ML IV EMUL
INTRAVENOUS | Status: DC | PRN
  Administered 2020-12-10: 50 ug/kg/min via INTRAVENOUS

## 2020-12-10 MED ORDER — FENTANYL CITRATE (PF) 100 MCG/2ML IJ SOLN
INTRAMUSCULAR | Status: AC
  Administered 2020-12-10: 50 ug via INTRAVENOUS
  Filled 2020-12-10: qty 2

## 2020-12-10 MED ORDER — ORAL CARE MOUTH RINSE: 15.0000 mL | Freq: Once | OROMUCOSAL | Status: AC

## 2020-12-10 MED ORDER — CHLORHEXIDINE GLUCONATE 0.12 % MT SOLN
15.0000 mL | Freq: Once | OROMUCOSAL | Status: AC
  Administered 2020-12-10: 15 mL via OROMUCOSAL
  Filled 2020-12-10: qty 15

## 2020-12-10 MED ORDER — FENTANYL CITRATE (PF) 100 MCG/2ML IJ SOLN: 50.0000 ug | Freq: Once | INTRAMUSCULAR | Status: AC

## 2020-12-10 MED ORDER — CEFAZOLIN SODIUM-DEXTROSE 2-4 GM/100ML-% IV SOLN
2.0000 g | INTRAVENOUS | Status: AC
  Administered 2020-12-10: 2 g via INTRAVENOUS
  Filled 2020-12-10: qty 100

## 2020-12-10 MED ORDER — MIDAZOLAM HCL 2 MG/2ML IJ SOLN: 2.0000 mg | Freq: Once | INTRAMUSCULAR | Status: AC

## 2020-12-10 MED ORDER — LIDOCAINE 2% (20 MG/ML) 5 ML SYRINGE
INTRAMUSCULAR | Status: DC | PRN
  Administered 2020-12-10: 60 mg via INTRAVENOUS

## 2020-12-10 MED ORDER — BUPIVACAINE-EPINEPHRINE (PF) 0.5% -1:200000 IJ SOLN
INTRAMUSCULAR | Status: DC | PRN
  Administered 2020-12-10: 30 mL via PERINEURAL

## 2020-12-10 MED ORDER — LACTATED RINGERS IV SOLN: INTRAVENOUS | Status: DC

## 2020-12-10 MED ORDER — OXYCODONE-ACETAMINOPHEN 5-325 MG PO TABS: 1.0000 | ORAL_TABLET | ORAL | 0 refills | Status: DC | PRN

## 2020-12-10 MED ORDER — PROPOFOL 10 MG/ML IV BOLUS
INTRAVENOUS | Status: DC | PRN
  Administered 2020-12-10: 20 mg via INTRAVENOUS
  Administered 2020-12-10: 15 mg via INTRAVENOUS
  Administered 2020-12-10: 20 mg via INTRAVENOUS

## 2020-12-10 MED ORDER — MIDAZOLAM HCL 2 MG/2ML IJ SOLN
INTRAMUSCULAR | Status: AC
  Administered 2020-12-10: 2 mg via INTRAVENOUS
  Filled 2020-12-10: qty 2

## 2020-12-10 SURGICAL SUPPLY — 29 items
BLADE SURG 21 STRL SS (BLADE) ×2 IMPLANT
BNDG COHESIVE 4X5 TAN STRL (GAUZE/BANDAGES/DRESSINGS) ×2 IMPLANT
BNDG ESMARK 4X9 LF (GAUZE/BANDAGES/DRESSINGS) IMPLANT
BNDG GAUZE ELAST 4 BULKY (GAUZE/BANDAGES/DRESSINGS) ×2 IMPLANT
COVER SURGICAL LIGHT HANDLE (MISCELLANEOUS) ×2 IMPLANT
DRAPE U-SHAPE 47X51 STRL (DRAPES) ×2 IMPLANT
DRSG ADAPTIC 3X8 NADH LF (GAUZE/BANDAGES/DRESSINGS) ×2 IMPLANT
DRSG PAD ABDOMINAL 8X10 ST (GAUZE/BANDAGES/DRESSINGS) ×2 IMPLANT
DURAPREP 26ML APPLICATOR (WOUND CARE) ×2 IMPLANT
ELECT REM PT RETURN 9FT ADLT (ELECTROSURGICAL) ×2
ELECTRODE REM PT RTRN 9FT ADLT (ELECTROSURGICAL) ×1 IMPLANT
GAUZE SPONGE 4X4 12PLY STRL (GAUZE/BANDAGES/DRESSINGS) ×2 IMPLANT
GLOVE BIOGEL PI IND STRL 9 (GLOVE) ×1 IMPLANT
GLOVE BIOGEL PI INDICATOR 9 (GLOVE) ×1
GLOVE SURG ORTHO 9.0 STRL STRW (GLOVE) ×2 IMPLANT
GOWN STRL REUS W/ TWL XL LVL3 (GOWN DISPOSABLE) ×2 IMPLANT
GOWN STRL REUS W/TWL XL LVL3 (GOWN DISPOSABLE) ×4
KIT BASIN OR (CUSTOM PROCEDURE TRAY) ×2 IMPLANT
KIT TURNOVER KIT B (KITS) ×2 IMPLANT
MANIFOLD NEPTUNE II (INSTRUMENTS) ×2 IMPLANT
NEEDLE 22X1 1/2 (OR ONLY) (NEEDLE) IMPLANT
NS IRRIG 1000ML POUR BTL (IV SOLUTION) ×2 IMPLANT
PACK ORTHO EXTREMITY (CUSTOM PROCEDURE TRAY) ×2 IMPLANT
PAD ARMBOARD 7.5X6 YLW CONV (MISCELLANEOUS) ×4 IMPLANT
SUT ETHILON 2 0 PSLX (SUTURE) ×2 IMPLANT
SYR CONTROL 10ML LL (SYRINGE) IMPLANT
TOWEL GREEN STERILE (TOWEL DISPOSABLE) ×2 IMPLANT
TUBE CONNECTING 12X1/4 (SUCTIONS) ×2 IMPLANT
YANKAUER SUCT BULB TIP NO VENT (SUCTIONS) ×2 IMPLANT

## 2020-12-10 NOTE — Progress Notes (Signed)
Orthopedic Tech Progress Note Patient Details:  Justin Landry 05/16/1961 [id number] PACU RN called requesting a PAIR OF CRUTCHES and a POST OP SHOE Ortho Devices Type of Ortho Device: Crutches,Postop shoe/boot Ortho Device/Splint Location: LLE Ortho Device/Splint Interventions: Application,Adjustment   Post Interventions Patient Tolerated: Well,Ambulated well Instructions Provided: Poper ambulation with device,Care of device   Donald Pore 12/10/2020, 10:43 AM

## 2020-12-10 NOTE — Anesthesia Procedure Notes (Signed)
Procedure Name: MAC Date/Time: 12/10/2020 8:35 AM Performed by: Lance Coon, CRNA Pre-anesthesia Checklist: Patient identified, Emergency Drugs available, Suction available and Patient being monitored Patient Re-evaluated:Patient Re-evaluated prior to induction Oxygen Delivery Method: Simple face mask

## 2020-12-10 NOTE — H&P (Signed)
Justin Landry is an 59 y.o. male.   Chief Complaint:Left great Toe Osteomyelitis HPI: Patient is a 59 year old gentleman with diabetic insensate neuropathy status post partial amputations of the great toe and second toe.  Patient developed an ulcer on the great toe after hunting he is currently been undergoing conservative wound management and states there has been progressive necrotic skin changes.  Past Medical History:  Diagnosis Date  . Diabetes mellitus   . Headache    migraines  . High cholesterol   . Humerus fracture    right  . Hypertension   . Inguinal hernia   . Neuromuscular disorder (HCC)    neuropathy    Past Surgical History:  Procedure Laterality Date  . AMPUTATION TOE Left 06/23/2019   Procedure: AMPUTATION TOE LEFT AMPUTATION;  Surgeon: Linus Galas, DPM;  Location: [facility] ORS;  Service: Podiatry;  Laterality: Left;  . AMPUTATION TOE Left 08/31/2019   Procedure: AMPUTATION TOE  IPJ [id number];  Surgeon: Linus Galas, DPM;  Location: [facility] ORS;  Service: Podiatry;  Laterality: Left;  . CYSTOSCOPY  2015   Biopsy  . HERNIA REPAIR Right 2008   [facility]  . REVERSE SHOULDER ARTHROPLASTY Right 09/18/2020   Procedure: RIGHT REVERSE SHOULDER ARTHROPLASTY;  Surgeon: Cammy Copa, MD;  Location: [facility] OR;  Service: Orthopedics;  Laterality: Right;    Family History  Problem Relation Age of Onset  . Diabetes Mother   . Hypertension Mother   . Diabetes Father   . Hypertension Father   . Cancer Brother        brain   Social History:  reports that he has been smoking cigarettes. He has been smoking about 0.25 packs per day. He has never used smokeless tobacco. He reports current alcohol use of about 2.0 standard drinks of alcohol per week. He reports current drug use. Drug: Marijuana.  Allergies:  Allergies  Allergen Reactions  . Anacin-3 [Acetaminophen] Nausea And Vomiting  . Morphine And Related Nausea And Vomiting and Other (See Comments)    Extreme sweating   .  Oxycontin [Oxycodone Hcl] Other (See Comments)    Extreme NAusea and vomitting follows (Patient Tolerates Percocet short acting)  . Penicillins Nausea And Vomiting    Has patient had a PCN reaction causing immediate rash, facial/tongue/throat swelling, SOB or lightheadedness with hypotension: No Has patient had a PCN reaction causing severe rash involving mucus membranes or skin necrosis: No Has patient had a PCN reaction that required hospitalization No Has patient had a PCN reaction occurring within the last 10 years: No If all of the above answers are "NO", then may proceed with Cephalosporin use.   . Tramadol Nausea Only    No medications prior to admission.    Results for orders placed or performed during the hospital encounter of 12/08/20 (from the past 48 hour(s))  SARS CORONAVIRUS 2 (TAT 6-24 HRS) Nasopharyngeal Nasopharyngeal Swab     Status: None   Collection Time: 12/08/20  1:44 PM   Specimen: Nasopharyngeal Swab  Result Value Ref Range   SARS Coronavirus 2 NEGATIVE NEGATIVE    Comment: (NOTE) SARS-CoV-2 target nucleic acids are NOT DETECTED.  The SARS-CoV-2 RNA is generally detectable in upper and lower respiratory specimens during the acute phase of infection. Negative results do not preclude SARS-CoV-2 infection, do not rule out co-infections with other pathogens, and should not be used as the sole basis for treatment or other patient management decisions. Negative results must be combined with clinical observations, patient history,  and epidemiological information. The expected result is Negative.  Fact Sheet for Patients: HairSlick.no  Fact Sheet for Healthcare Providers: quierodirigir.com  This test is not yet approved or cleared by the [place] FDA and  has been authorized for detection and/or diagnosis of SARS-CoV-2 by FDA under an Emergency Use Authorization (EUA). This EUA will remain  in effect  (meaning this test can be used) for the duration of the COVID-19 declaration under Se ction 564(b)(1) of the Act, 21 U.S.C. section 360bbb-3(b)(1), unless the authorization is terminated or revoked sooner.  Performed at [facility], [address] [place], [place] [address]    No results found.  Review of Systems  All other systems reviewed and are negative.   There were no vitals taken for this visit. Physical Exam  Patient is alert, oriented, no adenopathy, well-dressed, normal affect, normal respiratory effort. Examination patient has a well-healed partial amputation of the second toe the skin over the residual toe of the great toe is necrotic with exposed bone.  Previous Doppler exam showed a strong biphasic dorsalis pedis and posterior tibial pulse no evidence of arterial insufficiency.Heart RRR Lungs Clear Assessment/Plan 1. Osteomyelitis of great toe of left foot (HCC)     Plan: Due to progressive necrotic skin changes and exposed bone patient wished to proceed with amputation of the remainder of the residual left great toe he would like to set this up after Christmas we will plan for outpatient surgery at [facility].  Risks and benefits were discussed including risk of the wound not healing and need for additional surgery.   [place] Abenezer Odonell, PA 12/10/2020, 6:08 AM

## 2020-12-10 NOTE — Anesthesia Postprocedure Evaluation (Signed)
Anesthesia Post Note  Patient: Justin Landry  Procedure(s) Performed: LEFT GREAT TOE AMPUTATION (Left )     Patient location during evaluation: PACU Anesthesia Type: Regional Level of consciousness: awake and alert Pain management: pain level controlled Vital Signs Assessment: post-procedure vital signs reviewed and stable Respiratory status: spontaneous breathing, nonlabored ventilation, respiratory function stable and patient connected to nasal cannula oxygen Cardiovascular status: stable and blood pressure returned to baseline Postop Assessment: no apparent nausea or vomiting Anesthetic complications: no   No complications documented.  Last Vitals:  Vitals:   12/10/20 1000 12/10/20 1010  BP: 129/86 (!) 131/92  Pulse: 75 78  Resp: 17 20  Temp:  36.4 C  SpO2: 97% 95%    Last Pain:  Vitals:   12/10/20 1010  TempSrc:   PainSc: 0-No pain                 Kennieth Rad

## 2020-12-10 NOTE — Transfer of Care (Signed)
Immediate Anesthesia Transfer of Care Note  Patient: Justin Landry  Procedure(s) Performed: LEFT GREAT TOE AMPUTATION (Left )  Patient Location: PACU  Anesthesia Type:MAC combined with regional for post-op pain  Level of Consciousness: drowsy and patient cooperative  Airway & Oxygen Therapy: Patient Spontanous Breathing  Post-op Assessment: Report given to RN and Post -op Vital signs reviewed and stable  Post vital signs: Reviewed and stable  Last Vitals:  Vitals Value Taken Time  BP 104/69 12/10/20 0915  Temp    Pulse 77 12/10/20 0917  Resp 20 12/10/20 0917  SpO2 96 % 12/10/20 0917  Vitals shown include unvalidated device data.  Last Pain:  Vitals:   12/10/20 0740  TempSrc:   PainSc: 8       Patients Stated Pain Goal: 2 (11/73/56 7014)  Complications: No complications documented.

## 2020-12-10 NOTE — Anesthesia Procedure Notes (Signed)
Anesthesia Regional Block: Popliteal block   Pre-Anesthetic Checklist: ,, timeout performed, Correct Patient, Correct Site, Correct Laterality, Correct Procedure, Correct Position, site marked, Risks and benefits discussed,  Surgical consent,  Pre-op evaluation,  At surgeon's request and post-op pain management  Laterality: Left  Prep: chloraprep       Needles:  Injection technique: Single-shot  Needle Type: Echogenic Needle     Needle Length: 9cm  Needle Gauge: 21     Additional Needles:   Procedures:,,,, ultrasound used (permanent image in chart),,,,  Narrative:  Start time: 12/10/2020 7:44 AM End time: 12/10/2020 7:51 AM Injection made incrementally with aspirations every 5 mL.  Performed by: Personally  Anesthesiologist: Marcene Duos, MD

## 2020-12-10 NOTE — Op Note (Signed)
12/10/2020  9:08 AM  PATIENT:  Justin Landry    PRE-OPERATIVE DIAGNOSIS:  Osteomyelitis Left Great Toe  POST-OPERATIVE DIAGNOSIS:  Same  PROCEDURE:  LEFT GREAT TOE AMPUTATION  SURGEON:  [name], MD  PHYSICIAN ASSISTANT:None ANESTHESIA:   General  PREOPERATIVE INDICATIONS:  [name] is a  59 y.o. male with a diagnosis of Osteomyelitis Left Great Toe who failed conservative measures and elected for surgical management.    The risks benefits and alternatives were discussed with the patient preoperatively including but not limited to the risks of infection, bleeding, nerve injury, cardiopulmonary complications, the need for revision surgery, among others, and the patient was willing to proceed.  OPERATIVE IMPLANTS: None  @ENCIMAGES @  OPERATIVE FINDINGS: Margins clear good petechial bleeding  OPERATIVE PROCEDURE: Patient brought the operating room after undergoing a regional anesthetic after adequate levels anesthesia were obtained patient's left lower extremity was prepped using DuraPrep draped into a sterile field a timeout was called.  A fishmouth incision was made proximal to the ulcerative tissue this was carried sharply down to the MTP joint.  The toe was amputated through the MTP joint the wound was irrigated with normal saline electrocautery was used hemostasis the incision was closed using 2-0 nylon a sterile dressing was applied patient was taken the PACU in stable condition.   DISCHARGE PLANNING:  Antibiotic duration: Preoperative antibiotics  Weightbearing: Touchdown weightbearing on the left  Pain medication: Prescription for Percocet  Dressing care/ Wound VAC: Follow-up in the office 1 week to change the dressing  Ambulatory devices: Crutches  Discharge to: Home.  Follow-up: In the office 1 week post operative.

## 2020-12-10 NOTE — Anesthesia Preprocedure Evaluation (Signed)
Anesthesia Evaluation  Patient identified by MRN, date of birth, ID band Patient awake    Reviewed: Allergy & Precautions, NPO status , Patient's Chart, lab work & pertinent test results  Airway Mallampati: II  TM Distance: >3 FB     Dental  (+) Dental Advisory Given   Pulmonary Current Smoker,    breath sounds clear to auscultation       Cardiovascular hypertension, Pt. on medications  Rhythm:Regular Rate:Normal     Neuro/Psych  Neuromuscular disease    GI/Hepatic negative GI ROS, Neg liver ROS,   Endo/Other  diabetes, Type 2  Renal/GU negative Renal ROS     Musculoskeletal   Abdominal   Peds  Hematology negative hematology ROS (+)   Anesthesia Other Findings   Reproductive/Obstetrics                             Anesthesia Physical Anesthesia Plan  ASA: III  Anesthesia Plan: Regional   Post-op Pain Management:    Induction:   PONV Risk Score and Plan: 0 and Propofol infusion, Ondansetron and Treatment may vary due to age or medical condition  Airway Management Planned: Natural Airway and Simple Face Mask  Additional Equipment:   Intra-op Plan:   Post-operative Plan: Extubation in OR  Informed Consent: I have reviewed the patients History and Physical, chart, labs and discussed the procedure including the risks, benefits and alternatives for the proposed anesthesia with the patient or authorized representative who has indicated his/her understanding and acceptance.       Plan Discussed with: CRNA  Anesthesia Plan Comments:         Anesthesia Quick Evaluation

## 2020-12-10 NOTE — Interval H&P Note (Signed)
History and Physical Interval Note:  12/10/2020 6:42 AM  Justin Landry  has presented today for surgery, with the diagnosis of Osteomyelitis Left Great Toe.  The various methods of treatment have been discussed with the patient and family. After consideration of risks, benefits and other options for treatment, the patient has consented to  Procedure(s): LEFT GREAT TOE AMPUTATION (Left) as a surgical intervention.  The patient's history has been reviewed, patient examined, no change in status, stable for surgery.  I have reviewed the patient's chart and labs.  Questions were answered to the patient's satisfaction.     [name]

## 2020-12-11 ENCOUNTER — Encounter (HOSPITAL_COMMUNITY): Payer: Self-pay | Admitting: Orthopedic Surgery

## 2020-12-16 ENCOUNTER — Encounter: Payer: Self-pay | Admitting: Physician Assistant

## 2020-12-16 ENCOUNTER — Ambulatory Visit (INDEPENDENT_AMBULATORY_CARE_PROVIDER_SITE_OTHER): Payer: Self-pay | Admitting: Physician Assistant

## 2020-12-16 DIAGNOSIS — E785 Hyperlipidemia, unspecified: Secondary | ICD-10-CM

## 2020-12-16 MED ORDER — OXYCODONE-ACETAMINOPHEN 5-325 MG PO TABS: 1.0000 | ORAL_TABLET | ORAL | 0 refills | Status: DC | PRN

## 2020-12-16 NOTE — Progress Notes (Signed)
Office Visit Note   Patient: Justin Landry           Date of Birth: 1961/06/07           MRN: [id number] Visit Date: 12/16/2020              Requested by: Rolm Gala, NP [address] [place],  [place] [address] PCP: Rolm Gala, NP  No chief complaint on file.   HPI: This is a pleasant 60 year old gentleman who is status post great toe amputation.  He is overall doing well.  He is requesting refill of his Percocet.   Assessment & Plan: Visit Diagnoses:  1. Elevated lipids     Plan: With Di in 1 week.  Al soap and water.  Continue elevation and refrain from weightbearing.  Follow-up Patient may begin cleansing  Follow-Up Instructions: No follow-ups on file.   Ortho Exam  Patient is alert, oriented, no adenopathy, well-dressed, normal affect, normal respiratory effort. Focused examin lightest.  He has some minimal bloody drainage especially towards the lateral end of the wound near the remnant of the second toe.  No foul odor.  Ation of his foot demonstrates well approximated wound edges.  No ascending cellulitis  Imaging: No results found. No images are attached to the encounter.  Labs: Lab Results  Component Value Date   HGBA1C 7.5 (H) 09/24/2020   HGBA1C 7.2 (H) 09/10/2020   HGBA1C 7.6 (H) 06/25/2020   REPTSTATUS 09/11/2020 FINAL 09/10/2020   GRAMSTAIN  06/23/2019    FEW WBC PRESENT, PREDOMINANTLY PMN RARE GRAM POSITIVE COCCI    CULT (A) 09/10/2020    <10,000 COLONIES/mL INSIGNIFICANT GROWTH Performed at [facility], [address] [place], [place] [address]    LABORGA LECLERCIA ADECARBOXYLATA 06/23/2019   LABORGA STAPHYLOCOCCUS CAPRAE 06/23/2019   LABORGA STAPHYLOCOCCUS SIMULANS 06/23/2019     Lab Results  Component Value Date   ALBUMIN 4.2 09/10/2020   ALBUMIN 4.5 07/14/2020   ALBUMIN 4.5 06/25/2020    No results found for: MG No results found for: VD25OH  No results found for: PREALBUMIN CBC  EXTENDED Latest Ref Rng & Units 12/10/2020 09/10/2020 07/14/2020  WBC 4.0 - 10.5 K/uL 15.5(H) 12.1(H) 10.1  RBC 4.22 - 5.81 MIL/uL 4.83 5.22 5.15  HGB 13.0 - 17.0 g/dL 55.7 32.2 02.5  HCT 42.7 - 52.0 % 43.7 47.8 45.6  PLT 150 - 400 K/uL 367 303 342  NEUTROABS 1.7 - 7.7 K/uL - - 6.3  LYMPHSABS 0.7 - 4.0 K/uL - - 2.5     There is no height or weight on file to calculate BMI.  Orders:  No orders of the defined types were placed in this encounter.  Meds ordered this encounter  Medications  . oxyCODONE-acetaminophen (PERCOCET) 5-325 MG tablet    Sig: Take 1 tablet by mouth every 4 (four) hours as needed.    Dispense:  30 tablet    Refill:  0     Procedures: No procedures performed  Clinical Data: No additional findings.  ROS:  All other systems negative, except as noted in the HPI. Review of Systems  Objective: Vital Signs: There were no vitals taken for this visit.  Specialty Comments:  No specialty comments available.  PMFS History: Patient Active Problem List   Diagnosis Date Noted  . Osteomyelitis of great toe of left foot (HCC)   . Overgrown toenails 10/30/2020  . Pre-operative clearance 09/16/2020  . Abnormal EKG 09/16/2020  .  Chronic left shoulder pain 04/10/2020  . Toe pain 02/21/2020  . Anxiety 02/08/2020  . Left knee pain 02/07/2020  . Inguinodynia, right 11/13/2019  . Elevated lipids 10/04/2019  . 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 History of right inguinal hernia 08/14/2019  . Encounter to establish care 08/14/2019  . Anxiety 08/14/2019  . Right shoulder pain 08/14/2019  . Cellulitis 06/22/2019  . Essential hypertension 05/27/2015  . Type 2 diabetes mellitus without complication (HCC) 05/27/2015  . Diverticulosis of large intestine without hemorrhage 10/16/2014  . Recurrent right inguinal hernia 09/27/2014  . Tobacco use 08/01/2014  . Hematuria, gross 07/03/2014  .  Lower urinary tract symptoms (LUTS) 07/03/2014  . Diabetes mellitus (HCC) 06/25/2014  . Inguinal hernia, bilateral  04/10/2014   Past Medical History:  Diagnosis Date  . Diabetes mellitus   . Headache    migraines  . High cholesterol   . Humerus fracture    right  . Hypertension   . Inguinal hernia   . Neuromuscular disorder (HCC)    neuropathy    Family History  Problem Relation Age of Onset  . Diabetes Mother   . Hypertension Mother   . Diabetes Father   . Hypertension Father   . Cancer Brother        brain    Past Surgical History:  Procedure Laterality Date  . AMPUTATION Left 12/10/2020   Procedure: LEFT GREAT TOE AMPUTATION;  Surgeon: [name], MD;  Location: [facility] OR;  Service: Orthopedics;  Laterality: Left;  . AMPUTATION TOE Left 06/23/2019   Procedure: AMPUTATION TOE LEFT AMPUTATION;  Surgeon: Linus Galas, DPM;  Location: [facility] ORS;  Service: Podiatry;  Laterality: Left;  . AMPUTATION TOE Left 08/31/2019   Procedure: AMPUTATION TOE  IPJ [id number];  Surgeon: Linus Galas, DPM;  Location: [facility] ORS;  Service: Podiatry;  Laterality: Left;  . CYSTOSCOPY  2015   Biopsy  . HERNIA REPAIR Right 2008   [facility]  . REVERSE SHOULDER ARTHROPLASTY Right 09/18/2020   Procedure: RIGHT REVERSE SHOULDER ARTHROPLASTY;  Surgeon: Cammy Copa, MD;  Location: [facility] OR;  Service: Orthopedics;  Laterality: Right;   Social History   Occupational History  . Occupation: unemployed  Tobacco Use  . Smoking status: Current Every Day Smoker    Packs/day: 0.25    Types: Cigarettes  . Smokeless tobacco: Never Used  . Tobacco comment: cut down to quarter pack a day, aware of resources  Vaping Use  . Vaping Use: Never used  Substance and Sexual Activity  . Alcohol use: Yes    Alcohol/week: 2.0 standard drinks    Types: 2 Cans of beer per week    Comment: occasional  . Drug use: Yes    Types: Marijuana    Comment: daily - for neuropathy  . Sexual activity: Yes

## 2020-12-19 ENCOUNTER — Inpatient Hospital Stay: Payer: Self-pay | Admitting: Surgical

## 2020-12-22 ENCOUNTER — Inpatient Hospital Stay: Payer: Self-pay | Admitting: Orthopedic Surgery

## 2020-12-23 ENCOUNTER — Ambulatory Visit (INDEPENDENT_AMBULATORY_CARE_PROVIDER_SITE_OTHER): Payer: Self-pay | Admitting: Orthopedic Surgery

## 2020-12-23 DIAGNOSIS — S98112A Complete traumatic amputation of left great toe, initial encounter: Secondary | ICD-10-CM

## 2020-12-23 DIAGNOSIS — Z89412 Acquired absence of left great toe: Secondary | ICD-10-CM

## 2020-12-24 ENCOUNTER — Other Ambulatory Visit: Payer: Self-pay | Admitting: Physician Assistant

## 2020-12-24 ENCOUNTER — Encounter: Payer: Self-pay | Admitting: Orthopedic Surgery

## 2020-12-24 ENCOUNTER — Telehealth: Payer: Self-pay

## 2020-12-24 ENCOUNTER — Other Ambulatory Visit: Payer: Self-pay

## 2020-12-24 MED ORDER — OXYCODONE-ACETAMINOPHEN 5-325 MG PO TABS: 1.0000 | ORAL_TABLET | ORAL | 0 refills | Status: DC | PRN

## 2020-12-24 NOTE — Telephone Encounter (Signed)
Called pt to advise that this has been done. Pt has vm that has not been set up yet and unable to leave a message. Will hold and try again.

## 2020-12-24 NOTE — Progress Notes (Signed)
Office Visit Note   Patient: Justin Landry           Date of Birth: 22-Nov-1961           MRN: [id number] Visit Date: 12/23/2020              Requested by: Rolm Gala, NP [address] [place],  [place] [address] PCP: Rolm Gala, NP  No chief complaint on file.     HPI: Patient is a 60 year old gentleman who presents 2 weeks status post amputation left great toe at the MTP joint.  Patient is currently walking without assistance he states that he is weightbearing on his heel.  Assessment & Plan: Visit Diagnoses:  1. Amputated great toe of left foot (HCC)     Plan: Again discussed the importance of nonweightbearing washing with soap and water dry dressing changes daily.  A prescription was sent in for Percocet.  Follow-Up Instructions: Return in about 1 week (around 12/30/2020).   Ortho Exam  Patient is alert, oriented, no adenopathy, well-dressed, normal affect, normal respiratory effort. Examination the wound edges are well approximated there is no dehiscence no cellulitis no drainage no signs of infection.  Imaging: No results found. No images are attached to the encounter.  Labs: Lab Results  Component Value Date   HGBA1C 7.5 (H) 09/24/2020   HGBA1C 7.2 (H) 09/10/2020   HGBA1C 7.6 (H) 06/25/2020   REPTSTATUS 09/11/2020 FINAL 09/10/2020   GRAMSTAIN  06/23/2019    FEW WBC PRESENT, PREDOMINANTLY PMN RARE GRAM POSITIVE COCCI    CULT (A) 09/10/2020    <10,000 COLONIES/mL INSIGNIFICANT GROWTH Performed at [facility], [address], [place], [place] [address]    LABORGA LECLERCIA ADECARBOXYLATA 06/23/2019   LABORGA STAPHYLOCOCCUS CAPRAE 06/23/2019   LABORGA STAPHYLOCOCCUS SIMULANS 06/23/2019     Lab Results  Component Value Date   ALBUMIN 4.2 09/10/2020   ALBUMIN 4.5 07/14/2020   ALBUMIN 4.5 06/25/2020    No results found for: MG No results found for: VD25OH  No results found for: PREALBUMIN CBC  EXTENDED Latest Ref Rng & Units 12/10/2020 09/10/2020 07/14/2020  WBC 4.0 - 10.5 K/uL 15.5(H) 12.1(H) 10.1  RBC 4.22 - 5.81 MIL/uL 4.83 5.22 5.15  HGB 13.0 - 17.0 g/dL 98.2 64.1 58.3  HCT 09.4 - 52.0 % 43.7 47.8 45.6  PLT 150 - 400 K/uL 367 303 342  NEUTROABS 1.7 - 7.7 K/uL - - 6.3  LYMPHSABS 0.7 - 4.0 K/uL - - 2.5     There is no height or weight on file to calculate BMI.  Orders:  No orders of the defined types were placed in this encounter.  No orders of the defined types were placed in this encounter.    Procedures: No procedures performed  Clinical Data: No additional findings.  ROS:  All other systems negative, except as noted in the HPI. Review of Systems  Objective: Vital Signs: There were no vitals taken for this visit.  Specialty Comments:  No specialty comments available.  PMFS History: Patient Active Problem List   Diagnosis Date Noted  . Osteomyelitis of great toe of left foot (HCC)   . Overgrown toenails 10/30/2020  . Pre-operative clearance 09/16/2020  . Abnormal EKG 09/16/2020  . Chronic left shoulder pain 04/10/2020  . Toe pain 02/21/2020  . Anxiety 02/08/2020  . Left knee pain 02/07/2020  . Inguinodynia, right 11/13/2019  . Elevated lipids 10/04/2019  . Diabetic ulcer of toe of  left foot associated with diabetes mellitus due to underlying condition (HCC) 08/23/2019  . History of right inguinal hernia 08/14/2019  . Encounter to establish care 08/14/2019  . Anxiety 08/14/2019  . Right shoulder pain 08/14/2019  . Cellulitis 06/22/2019  . Essential hypertension 05/27/2015  . Type 2 diabetes mellitus without complication (HCC) 05/27/2015  . Diverticulosis of large intestine without hemorrhage 10/16/2014  . Recurrent right inguinal hernia 09/27/2014  . Tobacco use 08/01/2014  . Hematuria, gross 07/03/2014  . Lower urinary tract symptoms (LUTS) 07/03/2014  . Diabetes mellitus (HCC) 06/25/2014  . Inguinal hernia, bilateral  04/10/2014   Past  Medical History:  Diagnosis Date  . Diabetes mellitus   . Headache    migraines  . High cholesterol   . Humerus fracture    right  . Hypertension   . Inguinal hernia   . Neuromuscular disorder (HCC)    neuropathy    Family History  Problem Relation Age of Onset  . Diabetes Mother   . Hypertension Mother   . Diabetes Father   . Hypertension Father   . Cancer Brother        brain    Past Surgical History:  Procedure Laterality Date  . AMPUTATION Left 12/10/2020   Procedure: LEFT GREAT TOE AMPUTATION;  Surgeon: [name], MD;  Location: [facility] OR;  Service: Orthopedics;  Laterality: Left;  . AMPUTATION TOE Left 06/23/2019   Procedure: AMPUTATION TOE LEFT AMPUTATION;  Surgeon: Linus Galas, DPM;  Location: [facility] ORS;  Service: Podiatry;  Laterality: Left;  . AMPUTATION TOE Left 08/31/2019   Procedure: AMPUTATION TOE  IPJ [id number];  Surgeon: Linus Galas, DPM;  Location: [facility] ORS;  Service: Podiatry;  Laterality: Left;  . CYSTOSCOPY  2015   Biopsy  . HERNIA REPAIR Right 2008   [facility]  . REVERSE SHOULDER ARTHROPLASTY Right 09/18/2020   Procedure: RIGHT REVERSE SHOULDER ARTHROPLASTY;  Surgeon: Cammy Copa, MD;  Location: [facility] OR;  Service: Orthopedics;  Laterality: Right;   Social History   Occupational History  . Occupation: unemployed  Tobacco Use  . Smoking status: Current Every Day Smoker    Packs/day: 0.25    Types: Cigarettes  . Smokeless tobacco: Never Used  . Tobacco comment: cut down to quarter pack a day, aware of resources  Vaping Use  . Vaping Use: Never used  Substance and Sexual Activity  . Alcohol use: Yes    Alcohol/week: 2.0 standard drinks    Types: 2 Cans of beer per week    Comment: occasional  . Drug use: Yes    Types: Marijuana    Comment: daily - for neuropathy  . Sexual activity: Yes

## 2020-12-24 NOTE — Telephone Encounter (Signed)
Please see below and advise.

## 2020-12-24 NOTE — Telephone Encounter (Signed)
done

## 2020-12-24 NOTE — Telephone Encounter (Signed)
Patient called he stated his rx was supposed to be sent in yesterday for percocet 5 he stated it hasn't been sent in, he would like [facility] to [facility] [address] [place],[id number] [phone]

## 2020-12-30 ENCOUNTER — Ambulatory Visit: Payer: Self-pay | Admitting: Gerontology

## 2020-12-31 ENCOUNTER — Encounter: Payer: Self-pay | Admitting: Physician Assistant

## 2020-12-31 ENCOUNTER — Ambulatory Visit: Payer: Self-pay | Admitting: Gerontology

## 2020-12-31 ENCOUNTER — Ambulatory Visit (INDEPENDENT_AMBULATORY_CARE_PROVIDER_SITE_OTHER): Payer: Self-pay | Admitting: Physician Assistant

## 2020-12-31 DIAGNOSIS — M869 Osteomyelitis, unspecified: Secondary | ICD-10-CM

## 2020-12-31 MED ORDER — OXYCODONE-ACETAMINOPHEN 5-325 MG PO TABS: 1.0000 | ORAL_TABLET | Freq: Four times a day (QID) | ORAL | 0 refills | Status: DC | PRN

## 2020-12-31 NOTE — Progress Notes (Signed)
Office Visit Note   Patient: Justin Landry           Date of Birth: 01/21/1961           MRN: [id number] Visit Date: 12/31/2020              Requested by: Rolm Gala, NP [address] [place],  [place] [address] PCP: Rolm Gala, NP  No chief complaint on file.     HPI: Pleasant gentleman who is 3 weeks status post great toe amputation.  He has been trying to keep his weight off of this is much as possible.  Assessment & Plan: Visit Diagnoses: No diagnosis found.  Plan: Patient should continue to offload weightbearing continue with dressing changes follow-up in 1 week hopefully sutures remaining could be removed  Follow-Up Instructions: No follow-ups on file.   Ortho Exam  Patient is alert, oriented, no adenopathy, well-dressed, normal affect, normal respiratory effort. Examination demonstrates some superficial wound dehiscence with some granulation tissue bridging the gap except for the 1 distal area of the wound.  There is no foul odor no surrounding cellulitis or erythema minimal serous drainage  Imaging: No results found. No images are attached to the encounter.  Labs: Lab Results  Component Value Date   HGBA1C 7.5 (H) 09/24/2020   HGBA1C 7.2 (H) 09/10/2020   HGBA1C 7.6 (H) 06/25/2020   REPTSTATUS 09/11/2020 FINAL 09/10/2020   GRAMSTAIN  06/23/2019    FEW WBC PRESENT, PREDOMINANTLY PMN RARE GRAM POSITIVE COCCI    CULT (A) 09/10/2020    <10,000 COLONIES/mL INSIGNIFICANT GROWTH Performed at [facility], [address], [place], [place] [address]    LABORGA LECLERCIA ADECARBOXYLATA 06/23/2019   LABORGA STAPHYLOCOCCUS CAPRAE 06/23/2019   LABORGA STAPHYLOCOCCUS SIMULANS 06/23/2019     Lab Results  Component Value Date   ALBUMIN 4.2 09/10/2020   ALBUMIN 4.5 07/14/2020   ALBUMIN 4.5 06/25/2020    No results found for: MG No results found for: VD25OH  No results found for: PREALBUMIN CBC EXTENDED Latest  Ref Rng & Units 12/10/2020 09/10/2020 07/14/2020  WBC 4.0 - 10.5 K/uL 15.5(H) 12.1(H) 10.1  RBC 4.22 - 5.81 MIL/uL 4.83 5.22 5.15  HGB 13.0 - 17.0 g/dL 24.8 25.0 03.7  HCT 04.8 - 52.0 % 43.7 47.8 45.6  PLT 150 - 400 K/uL 367 303 342  NEUTROABS 1.7 - 7.7 K/uL - - 6.3  LYMPHSABS 0.7 - 4.0 K/uL - - 2.5     There is no height or weight on file to calculate BMI.  Orders:  No orders of the defined types were placed in this encounter.  Meds ordered this encounter  Medications  . oxyCODONE-acetaminophen (PERCOCET) 5-325 MG tablet    Sig: Take 1 tablet by mouth every 6 (six) hours as needed.    Dispense:  30 tablet    Refill:  0     Procedures: No procedures performed  Clinical Data: No additional findings.  ROS:  All other systems negative, except as noted in the HPI. Review of Systems  Objective: Vital Signs: There were no vitals taken for this visit.  Specialty Comments:  No specialty comments available.  PMFS History: Patient Active Problem List   Diagnosis Date Noted  . Osteomyelitis of great toe of left foot (HCC)   . Overgrown toenails 10/30/2020  . Pre-operative clearance 09/16/2020  . Abnormal EKG 09/16/2020  . Chronic left shoulder pain 04/10/2020  . Toe pain 02/21/2020  .  Anxiety 02/08/2020  . Left knee pain 02/07/2020  . Inguinodynia, right 11/13/2019  . Elevated lipids 10/04/2019  . 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 History of right inguinal hernia 08/14/2019  . Encounter to establish care 08/14/2019  . Anxiety 08/14/2019  . Right shoulder pain 08/14/2019  . Cellulitis 06/22/2019  . Essential hypertension 05/27/2015  . Type 2 diabetes mellitus without complication (HCC) 05/27/2015  . Diverticulosis of large intestine without hemorrhage 10/16/2014  . Recurrent right inguinal hernia 09/27/2014  . Tobacco use 08/01/2014  . Hematuria, gross 07/03/2014  . Lower urinary  tract symptoms (LUTS) 07/03/2014  . Diabetes mellitus (HCC) 06/25/2014  . Inguinal hernia, bilateral  04/10/2014   Past Medical History:  Diagnosis Date  . Diabetes mellitus   . Headache    migraines  . High cholesterol   . Humerus fracture    right  . Hypertension   . Inguinal hernia   . Neuromuscular disorder (HCC)    neuropathy    Family History  Problem Relation Age of Onset  . Diabetes Mother   . Hypertension Mother   . Diabetes Father   . Hypertension Father   . Cancer Brother        brain    Past Surgical History:  Procedure Laterality Date  . AMPUTATION Left 12/10/2020   Procedure: LEFT GREAT TOE AMPUTATION;  Surgeon: [name], MD;  Location: [facility] OR;  Service: Orthopedics;  Laterality: Left;  . AMPUTATION TOE Left 06/23/2019   Procedure: AMPUTATION TOE LEFT AMPUTATION;  Surgeon: Linus Galas, DPM;  Location: [facility] ORS;  Service: Podiatry;  Laterality: Left;  . AMPUTATION TOE Left 08/31/2019   Procedure: AMPUTATION TOE  IPJ [id number];  Surgeon: Linus Galas, DPM;  Location: [facility] ORS;  Service: Podiatry;  Laterality: Left;  . CYSTOSCOPY  2015   Biopsy  . HERNIA REPAIR Right 2008   [facility]  . REVERSE SHOULDER ARTHROPLASTY Right 09/18/2020   Procedure: RIGHT REVERSE SHOULDER ARTHROPLASTY;  Surgeon: Cammy Copa, MD;  Location: [facility] OR;  Service: Orthopedics;  Laterality: Right;   Social History   Occupational History  . Occupation: unemployed  Tobacco Use  . Smoking status: Current Every Day Smoker    Packs/day: 0.25    Types: Cigarettes  . Smokeless tobacco: Never Used  . Tobacco comment: cut down to quarter pack a day, aware of resources  Vaping Use  . Vaping Use: Never used  Substance and Sexual Activity  . Alcohol use: Yes    Alcohol/week: 2.0 standard drinks    Types: 2 Cans of beer per week    Comment: occasional  . Drug use: Yes    Types: Marijuana    Comment: daily - for neuropathy  . Sexual activity: Yes

## 2021-01-06 ENCOUNTER — Ambulatory Visit (INDEPENDENT_AMBULATORY_CARE_PROVIDER_SITE_OTHER): Payer: Self-pay | Admitting: Orthopedic Surgery

## 2021-01-06 DIAGNOSIS — S98112A Complete traumatic amputation of left great toe, initial encounter: Secondary | ICD-10-CM

## 2021-01-06 MED ORDER — OXYCODONE-ACETAMINOPHEN 5-325 MG PO TABS: 1.0000 | ORAL_TABLET | Freq: Four times a day (QID) | ORAL | 0 refills | Status: DC | PRN

## 2021-01-07 ENCOUNTER — Ambulatory Visit: Payer: Self-pay | Admitting: Orthopedic Surgery

## 2021-01-07 ENCOUNTER — Encounter: Payer: Self-pay | Admitting: Orthopedic Surgery

## 2021-01-07 NOTE — Progress Notes (Signed)
Office Visit Note   Patient: Justin Landry           Date of Birth: Dec 07, 1961           MRN: [id number] Visit Date: 01/06/2021              Requested by: Rolm Gala, NP [address] [place],  [place] [address] PCP: Rolm Gala, NP  Chief Complaint  Patient presents with  . Left Foot - Routine Post Op    12/10/20 left GT amputation   . Right Foot - Pain      HPI: Patient is a 60 year old gentleman who presents in follow-up status post amputation of left great toe approximately 4 weeks ago.  Patient is full weightbearing in regular shoes.  Patient states he has some stinging and burning pain in both feet.  Assessment & Plan: Visit Diagnoses:  1. 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   Ortho Exam  Patient is alert, oriented, no adenopathy, well-dressed, normal affect, normal respiratory effort. Examination there is slight dehiscence of the wound from swelling and weightbearing.  The remaining sutures were removed.  There is no cellulitis no odor no drainage no depth to the wound no signs of infection.  Imaging: No results found. No images are attached to the encounter.  Labs: Lab Results  Component Value Date   HGBA1C 7.5 (H) 09/24/2020   HGBA1C 7.2 (H) 09/10/2020   HGBA1C 7.6 (H) 06/25/2020   REPTSTATUS 09/11/2020 FINAL 09/10/2020   GRAMSTAIN  06/23/2019    FEW WBC PRESENT, PREDOMINANTLY PMN RARE GRAM POSITIVE COCCI    CULT (A) 09/10/2020    <10,000 COLONIES/mL INSIGNIFICANT GROWTH Performed at [facility], [address], [place], [place] [address]    LABORGA LECLERCIA ADECARBOXYLATA 06/23/2019   LABORGA STAPHYLOCOCCUS CAPRAE 06/23/2019   LABORGA STAPHYLOCOCCUS SIMULANS 06/23/2019     Lab Results  Component Value Date   ALBUMIN 4.2  09/10/2020   ALBUMIN 4.5 07/14/2020   ALBUMIN 4.5 06/25/2020    No results found for: MG No results found for: VD25OH  No results found for: PREALBUMIN CBC EXTENDED Latest Ref Rng & Units 12/10/2020 09/10/2020 07/14/2020  WBC 4.0 - 10.5 K/uL 15.5(H) 12.1(H) 10.1  RBC 4.22 - 5.81 MIL/uL 4.83 5.22 5.15  HGB 13.0 - 17.0 g/dL 44.0 34.7 42.5  HCT 95.6 - 52.0 % 43.7 47.8 45.6  PLT 150 - 400 K/uL 367 303 342  NEUTROABS 1.7 - 7.7 K/uL - - 6.3  LYMPHSABS 0.7 - 4.0 K/uL - - 2.5     There is no height or weight on file to calculate BMI.  Orders:  No orders of the defined types were placed in this encounter.  Meds ordered this encounter  Medications  . oxyCODONE-acetaminophen (PERCOCET) 5-325 MG tablet    Sig: Take 1 tablet by mouth every 6 (six) hours as needed.    Dispense:  30 tablet    Refill:  0     Procedures: No procedures performed  Clinical Data: No additional findings.  ROS:  All other systems negative, except as noted in the HPI. Review of Systems  Objective: Vital Signs: There were no vitals taken for this visit.  Specialty Comments:  No specialty comments available.  PMFS History: Patient Active Problem List   Diagnosis  Date Noted  . Osteomyelitis of great toe of left foot (HCC)   . Overgrown toenails 10/30/2020  . Pre-operative clearance 09/16/2020  . Abnormal EKG 09/16/2020  . Chronic left shoulder pain 04/10/2020  . Toe pain 02/21/2020  . Anxiety 02/08/2020  . Left knee pain 02/07/2020  . Inguinodynia, right 11/13/2019  . Elevated lipids 10/04/2019  . 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 History of right inguinal hernia 08/14/2019  . Encounter to establish care 08/14/2019  . Anxiety 08/14/2019  . Right shoulder pain 08/14/2019  . Cellulitis 06/22/2019  . Essential hypertension 05/27/2015  . Type 2 diabetes mellitus without complication (HCC) 05/27/2015  .  Diverticulosis of large intestine without hemorrhage 10/16/2014  . Recurrent right inguinal hernia 09/27/2014  . Tobacco use 08/01/2014  . Hematuria, gross 07/03/2014  . Lower urinary tract symptoms (LUTS) 07/03/2014  . Diabetes mellitus (HCC) 06/25/2014  . Inguinal hernia, bilateral  04/10/2014   Past Medical History:  Diagnosis Date  . Diabetes mellitus   . Headache    migraines  . High cholesterol   . Humerus fracture    right  . Hypertension   . Inguinal hernia   . Neuromuscular disorder (HCC)    neuropathy    Family History  Problem Relation Age of Onset  . Diabetes Mother   . Hypertension Mother   . Diabetes Father   . Hypertension Father   . Cancer Brother        brain    Past Surgical History:  Procedure Laterality Date  . AMPUTATION Left 12/10/2020   Procedure: LEFT GREAT TOE AMPUTATION;  Surgeon: [name], MD;  Location: [facility] OR;  Service: Orthopedics;  Laterality: Left;  . AMPUTATION TOE Left 06/23/2019   Procedure: AMPUTATION TOE LEFT AMPUTATION;  Surgeon: Linus Galas, DPM;  Location: [facility] ORS;  Service: Podiatry;  Laterality: Left;  . AMPUTATION TOE Left 08/31/2019   Procedure: AMPUTATION TOE  IPJ [id number];  Surgeon: Linus Galas, DPM;  Location: [facility] ORS;  Service: Podiatry;  Laterality: Left;  . CYSTOSCOPY  2015   Biopsy  . HERNIA REPAIR Right 2008   [facility]  . REVERSE SHOULDER ARTHROPLASTY Right 09/18/2020   Procedure: RIGHT REVERSE SHOULDER ARTHROPLASTY;  Surgeon: Cammy Copa, MD;  Location: [facility] OR;  Service: Orthopedics;  Laterality: Right;   Social History   Occupational History  . Occupation: unemployed  Tobacco Use  . Smoking status: Current Every Day Smoker    Packs/day: 0.25    Types: Cigarettes  . Smokeless tobacco: Never Used  . Tobacco comment: cut down to quarter pack a day, aware of resources  Vaping Use  . Vaping Use: Never used  Substance and Sexual Activity  . Alcohol use: Yes    Alcohol/week: 2.0 standard drinks     Types: 2 Cans of beer per week    Comment: occasional  . Drug use: Yes    Types: Marijuana    Comment: daily - for neuropathy  . Sexual activity: Yes

## 2021-01-08 ENCOUNTER — Telehealth: Payer: Self-pay | Admitting: Orthopedic Surgery

## 2021-01-08 ENCOUNTER — Ambulatory Visit: Payer: Self-pay | Admitting: Gerontology

## 2021-01-08 NOTE — Telephone Encounter (Signed)
Pt worked on [employer] scheduled tomorrow

## 2021-01-08 NOTE — Telephone Encounter (Signed)
Pt friend called and said he needs to come in because his toe is opening where the stitches are. CB [phone]

## 2021-01-08 NOTE — Telephone Encounter (Signed)
Pt friend "Olegario Messier" called and said he needs to come in because his toe is opening where the stitches are. CB [phone]

## 2021-01-09 ENCOUNTER — Ambulatory Visit (INDEPENDENT_AMBULATORY_CARE_PROVIDER_SITE_OTHER): Payer: Self-pay | Admitting: Orthopaedic Surgery

## 2021-01-09 ENCOUNTER — Other Ambulatory Visit: Payer: Self-pay

## 2021-01-09 DIAGNOSIS — S91102A Unspecified open wound of left great toe without damage to nail, initial encounter: Secondary | ICD-10-CM

## 2021-01-09 MED ORDER — OXYCODONE-ACETAMINOPHEN 5-325 MG PO TABS: 1.0000 | ORAL_TABLET | Freq: Three times a day (TID) | ORAL | 0 refills | Status: DC | PRN

## 2021-01-09 MED ORDER — DOXYCYCLINE MONOHYDRATE 100 MG PO TABS: 100.0000 mg | ORAL_TABLET | Freq: Two times a day (BID) | ORAL | 0 refills | Status: DC

## 2021-01-09 NOTE — Progress Notes (Signed)
Post-Op Visit Note   Patient: Justin Landry           Date of Birth: [birth year]           MRN: [id number] Visit Date: 01/09/2021 PCP: Rolm Gala, NP   Assessment & Plan:  Chief Complaint:  Chief Complaint  Patient presents with  . Left Foot - Routine Post Op, Wound Check   Visit Diagnoses:  1. Open wound of left great toe, initial encounter     Plan: Patient is a pleasant 60 year old gentleman who is a patient of Dr. Audrie Lia who comes in today with concerns about his left great toe.  He is status post left great toe amputation by Dr. Lajoyce Corners on 12/10/2020.  He subsequently developed a small wound dehiscence a few weeks later.  This was assessed by Dr. Lajoyce Corners where there is no signs of infection.  He comes in today as his wound has completely opened up.  This happened approximately 2 days ago after slipping in the shower.  He has had mild drainage since.  Increased pain which is mildly relieved with Percocet.  He denies any fevers or chills.  He has been weightbearing in a postoperative shoe.  Examination of his left great toe reveals a complete wound dehiscence with mild serous drainage.  He has moderate tenderness throughout.  Mild erythema.  Mild warmth.  No induration.  Vital signs today: Blood pressure 180/96, respiratory rate 22, pulse 92.  O2 saturation on room air is 98% and temperature is 98.0 F.  At this point, he is not septic.  We will apply wet-to-dry dressing changes.  We have also recommend that he does this twice daily at home and we have provided supplies for this.  I have also called in doxycycline which she will pick up on the way home.  He will follow up with Dr. Lajoyce Corners this coming Tuesday when he is back in town.  Should he develop any concerning systemic symptoms in the meantime to include fevers or chills, he will go to the emergency department.  Call with concerns or questions in the meantime.  Follow-Up Instructions: Return for with Dr. Lajoyce Corners Tuesday.    Orders:  No orders of the defined types were placed in this encounter.  Meds ordered this encounter  Medications  . doxycycline (ADOXA) 100 MG tablet    Sig: Take 1 tablet (100 mg total) by mouth 2 (two) times daily.    Dispense:  14 tablet    Refill:  0  . oxyCODONE-acetaminophen (PERCOCET) 5-325 MG tablet    Sig: Take 1-2 tablets by mouth 3 (three) times daily as needed.    Dispense:  60 tablet    Refill:  0    Imaging: No new imaging  PMFS History: Patient Active Problem List   Diagnosis Date Noted  . Osteomyelitis of great toe of left foot (HCC)   . Overgrown toenails 10/30/2020  . Pre-operative clearance 09/16/2020  . Abnormal EKG 09/16/2020  . Chronic left shoulder pain 04/10/2020  . Toe pain 02/21/2020  . Anxiety 02/08/2020  . Left knee pain 02/07/2020  . Inguinodynia, right 11/13/2019  . Elevated lipids 10/04/2019  . 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 History of right inguinal hernia 08/14/2019  . Encounter to establish care 08/14/2019  . Anxiety 08/14/2019  . Right shoulder pain 08/14/2019  . Cellulitis 06/22/2019  . Essential hypertension 05/27/2015  . Type  2 diabetes mellitus without complication (HCC) 05/27/2015  . Diverticulosis of large intestine without hemorrhage 10/16/2014  . Recurrent right inguinal hernia 09/27/2014  . Tobacco use 08/01/2014  . Hematuria, gross 07/03/2014  . Lower urinary tract symptoms (LUTS) 07/03/2014  . Diabetes mellitus (HCC) 06/25/2014  . Inguinal hernia, bilateral  04/10/2014   Past Medical History:  Diagnosis Date  . Diabetes mellitus   . Headache    migraines  . High cholesterol   . Humerus fracture    right  . Hypertension   . Inguinal hernia   . Neuromuscular disorder (HCC)    neuropathy    Family History  Problem Relation Age of Onset  . Diabetes Mother   . Hypertension Mother   . Diabetes Father   . Hypertension Father    . Cancer Brother        brain    Past Surgical History:  Procedure Laterality Date  . AMPUTATION Left 12/10/2020   Procedure: LEFT GREAT TOE AMPUTATION;  Surgeon: [name], MD;  Location: [facility] OR;  Service: Orthopedics;  Laterality: Left;  . AMPUTATION TOE Left 06/23/2019   Procedure: AMPUTATION TOE LEFT AMPUTATION;  Surgeon: Linus Galas, DPM;  Location: [facility] ORS;  Service: Podiatry;  Laterality: Left;  . AMPUTATION TOE Left 08/31/2019   Procedure: AMPUTATION TOE  IPJ [id number];  Surgeon: Linus Galas, DPM;  Location: [facility] ORS;  Service: Podiatry;  Laterality: Left;  . CYSTOSCOPY  2015   Biopsy  . HERNIA REPAIR Right 2008   [facility]  . REVERSE SHOULDER ARTHROPLASTY Right 09/18/2020   Procedure: RIGHT REVERSE SHOULDER ARTHROPLASTY;  Surgeon: Cammy Copa, MD;  Location[facility] OR;  Service: Orthopedics;  Laterality: Right;   Social History   Occupational History  . Occupation: unemployed  Tobacco Use  . Smoking status: Current Every Day Smoker    Packs/day: 0.25    Types: Cigarettes  . Smokeless tobacco: Never Used  . Tobacco comment: cut down to quarter pack a day, aware of resources  Vaping Use  . Vaping Use: Never used  Substance and Sexual Activity  . Alcohol use: Yes    Alcohol/week: 2.0 standard drinks    Types: 2 Cans of beer per week    Comment: occasional  . Drug use: Yes    Types: Marijuana    Comment: daily - for neuropathy  . Sexual activity: Yes

## 2021-01-14 ENCOUNTER — Ambulatory Visit: Payer: Self-pay | Admitting: Orthopedic Surgery

## 2021-01-14 ENCOUNTER — Telehealth: Payer: Self-pay | Admitting: Gerontology

## 2021-01-14 NOTE — Telephone Encounter (Signed)
Called Justin Landry to notify of appointment on 2/9 at 1:30pm. Voicemail not set up so couldn't leave a message.

## 2021-01-19 ENCOUNTER — Ambulatory Visit (INDEPENDENT_AMBULATORY_CARE_PROVIDER_SITE_OTHER): Payer: Self-pay | Admitting: Orthopedic Surgery

## 2021-01-19 ENCOUNTER — Other Ambulatory Visit: Payer: Self-pay | Admitting: Physician Assistant

## 2021-01-19 ENCOUNTER — Other Ambulatory Visit (HOSPITAL_COMMUNITY): Payer: Self-pay

## 2021-01-19 ENCOUNTER — Encounter: Payer: Self-pay | Admitting: Orthopedic Surgery

## 2021-01-19 DIAGNOSIS — T8781 Dehiscence of amputation stump: Secondary | ICD-10-CM

## 2021-01-19 MED ORDER — OXYCODONE-ACETAMINOPHEN 5-325 MG PO TABS: 1.0000 | ORAL_TABLET | Freq: Three times a day (TID) | ORAL | 0 refills | Status: DC | PRN

## 2021-01-19 NOTE — Progress Notes (Signed)
Office Visit Note   Patient: Justin Landry           Date of Birth: Apr 14, 1961           MRN: [id number] Visit Date: 01/19/2021              Requested by: Rolm Gala, NP [address] [place],  [place] [address] PCP: Rolm Gala, NP  Chief Complaint  Patient presents with  . Left Foot - Routine Post Op    12/10/20 left GT amputation       HPI: Patient is a 60 year old gentleman who is seen in follow-up status post great toe amputation of the MTP joint.  Patient is still on doxycycline he has been full weightbearing he states he slipped in the shower and struck his foot causing the wound to open.  Patient states that he is still smoking's had 3 cigarettes this morning.  Assessment & Plan: Visit Diagnoses:  1. Dehiscence of amputation stump (HCC)     Plan: Discussed that we will need to proceed with revision surgery amputation of partial first and second ray with wound closure continue the antibiotics we will call in a prescription for Percocet.  Plan for surgery on Wednesday with discharge to home after surgery discussed the importance of strict nonweightbearing with his walker and the importance of smoking cessation for wound healing.  Follow-Up Instructions: Return in about 1 week (around 01/26/2021).   Ortho Exam  Patient is alert, oriented, no adenopathy, well-dressed, normal affect, normal respiratory effort. Examination patient has cellulitis over the medial aspect of the left foot there is no purulent drainage there is bruising and dehiscence of the wound from the blunt trauma the first metatarsal head is completely exposed.  His previous partial second toe amputation is healed nicely.  The Doppler was used and he has a strong biphasic dorsalis pedis and posterior tibial pulse.  Imaging: No results found. No images are attached to the encounter.  Labs: Lab Results  Component Value Date   HGBA1C 7.5 (H) 09/24/2020   HGBA1C  7.2 (H) 09/10/2020   HGBA1C 7.6 (H) 06/25/2020   REPTSTATUS 09/11/2020 FINAL 09/10/2020   GRAMSTAIN  06/23/2019    FEW WBC PRESENT, PREDOMINANTLY PMN RARE GRAM POSITIVE COCCI    CULT (A) 09/10/2020    <10,000 COLONIES/mL INSIGNIFICANT GROWTH Performed at [facility], [address] [place], [place] [address]    LABORGA LECLERCIA ADECARBOXYLATA 06/23/2019   LABORGA STAPHYLOCOCCUS CAPRAE 06/23/2019   LABORGA STAPHYLOCOCCUS SIMULANS 06/23/2019     Lab Results  Component Value Date   ALBUMIN 4.2 09/10/2020   ALBUMIN 4.5 07/14/2020   ALBUMIN 4.5 06/25/2020    No results found for: MG No results found for: VD25OH  No results found for: PREALBUMIN CBC EXTENDED Latest Ref Rng & Units 12/10/2020 09/10/2020 07/14/2020  WBC 4.0 - 10.5 K/uL 15.5(H) 12.1(H) 10.1  RBC 4.22 - 5.81 MIL/uL 4.83 5.22 5.15  HGB 13.0 - 17.0 g/dL 22.9 79.8 92.1  HCT 19.4 - 52.0 % 43.7 47.8 45.6  PLT 150 - 400 K/uL 367 303 342  NEUTROABS 1.7 - 7.7 K/uL - - 6.3  LYMPHSABS 0.7 - 4.0 K/uL - - 2.5     There is no height or weight on file to calculate BMI.  Orders:  No orders of the defined types were placed in this encounter.  No orders of the defined types were placed in this encounter.    Procedures:  No procedures performed  Clinical Data: No additional findings.  ROS:  All other systems negative, except as noted in the HPI. Review of Systems  Objective: Vital Signs: There were no vitals taken for this visit.  Specialty Comments:  No specialty comments available.  PMFS History: Patient Active Problem List   Diagnosis Date Noted  . Osteomyelitis of great toe of left foot (HCC)   . Overgrown toenails 10/30/2020  . Pre-operative clearance 09/16/2020  . Abnormal EKG 09/16/2020  . Chronic left shoulder pain 04/10/2020  . Toe pain 02/21/2020  . Anxiety 02/08/2020  . Left knee pain 02/07/2020  . Inguinodynia, right 11/13/2019  . Elevated lipids 10/04/2019  . Diabetic ulcer of toe of  left foot associated with diabetes mellitus due to underlying condition (HCC) 08/23/2019  . History of right inguinal hernia 08/14/2019  . Encounter to establish care 08/14/2019  . Anxiety 08/14/2019  . Right shoulder pain 08/14/2019  . Cellulitis 06/22/2019  . Essential hypertension 05/27/2015  . Type 2 diabetes mellitus without complication (HCC) 05/27/2015  . Diverticulosis of large intestine without hemorrhage 10/16/2014  . Recurrent right inguinal hernia 09/27/2014  . Tobacco use 08/01/2014  . Hematuria, gross 07/03/2014  . Lower urinary tract symptoms (LUTS) 07/03/2014  . Diabetes mellitus (HCC) 06/25/2014  . Inguinal hernia, bilateral  04/10/2014   Past Medical History:  Diagnosis Date  . Diabetes mellitus   . Headache    migraines  . High cholesterol   . Humerus fracture    right  . Hypertension   . Inguinal hernia   . Neuromuscular disorder (HCC)    neuropathy    Family History  Problem Relation Age of Onset  . Diabetes Mother   . Hypertension Mother   . Diabetes Father   . Hypertension Father   . Cancer Brother        brain    Past Surgical History:  Procedure Laterality Date  . AMPUTATION Left 12/10/2020   Procedure: LEFT GREAT TOE AMPUTATION;  Surgeon: [name], MD;  Location: [facility] OR;  Service: Orthopedics;  Laterality: Left;  . AMPUTATION TOE Left 06/23/2019   Procedure: AMPUTATION TOE LEFT AMPUTATION;  Surgeon: Linus Galas, DPM;  Location: [facility] ORS;  Service: Podiatry;  Laterality: Left;  . AMPUTATION TOE Left 08/31/2019   Procedure: AMPUTATION TOE  IPJ [id number];  Surgeon: Linus Galas, DPM;  Location: [facility] ORS;  Service: Podiatry;  Laterality: Left;  . CYSTOSCOPY  2015   Biopsy  . HERNIA REPAIR Right 2008   [facility]  . REVERSE SHOULDER ARTHROPLASTY Right 09/18/2020   Procedure: RIGHT REVERSE SHOULDER ARTHROPLASTY;  Surgeon: Cammy Copa, MD;  Location: [facility] OR;  Service: Orthopedics;  Laterality: Right;   Social History   Occupational History   . Occupation: unemployed  Tobacco Use  . Smoking status: Current Every Day Smoker    Packs/day: 0.25    Types: Cigarettes  . Smokeless tobacco: Never Used  . Tobacco comment: cut down to quarter pack a day, aware of resources  Vaping Use  . Vaping Use: Never used  Substance and Sexual Activity  . Alcohol use: Yes    Alcohol/week: 2.0 standard drinks    Types: 2 Cans of beer per week    Comment: occasional  . Drug use: Yes    Types: Marijuana    Comment: daily - for neuropathy  . Sexual activity: Yes

## 2021-01-20 ENCOUNTER — Encounter (HOSPITAL_COMMUNITY): Payer: Self-pay | Admitting: Orthopedic Surgery

## 2021-01-20 ENCOUNTER — Other Ambulatory Visit: Payer: Self-pay

## 2021-01-20 NOTE — Progress Notes (Signed)
Mr. Choplin has been called numerous times in the past 2 days, voice mail not set up, I called an emergency contact's number , Mr. Lank was there, he lives there. Patient said he broken his phone and he threw it away. Mr. Mckeithan denies chest pain or shortness of breath.  Patient denies any s/s of Covid, Patient denies having any s/s of Covid in his household.  Patient denies any known exposure to Covid. Mr. Kray will have to be tested on arrival in am- he said he did not have a ride.   Mr. Choe has type II DM, patient reported that fasting CBG was 127 this am.  I instructed patient to not take Glypizide tonight and no DM medications in am. I instructed patient to check CBG after awaking and every 2 hours until arrival  to the hospital.  I Instructed patient if CBG is less than 70 to take 4 Glucose Tablets or 1 tube of Glucose Gel or 1/2 cup of a clear juice. Recheck CBG in 15 minutes.  Mr. Fussell did not have a list of medication, the medication were in another room that he can't get to at this time. Mr. Guerrant confirmed the medications that I instructed him to take in am: Amlodipine, Gabapentin, Hydralyzine and Symbicort inhaler if needed Percocet.

## 2021-01-21 ENCOUNTER — Ambulatory Visit (HOSPITAL_COMMUNITY): Payer: Medicaid Other | Admitting: Certified Registered Nurse Anesthetist

## 2021-01-21 ENCOUNTER — Ambulatory Visit (HOSPITAL_COMMUNITY)
Admission: RE | Admit: 2021-01-21 | Discharge: 2021-01-21 | Disposition: A | Discharge status: Home / Self Care | Payer: Medicaid Other | Attending: Orthopedic Surgery | Admitting: Orthopedic Surgery

## 2021-01-21 ENCOUNTER — Encounter (HOSPITAL_COMMUNITY): Payer: Self-pay | Admitting: Orthopedic Surgery

## 2021-01-21 ENCOUNTER — Ambulatory Visit: Payer: Self-pay | Admitting: Gerontology

## 2021-01-21 ENCOUNTER — Encounter (HOSPITAL_COMMUNITY)
Admission: RE | Disposition: A | Discharge status: Home / Self Care | Payer: Self-pay | Source: Home / Self Care | Attending: Orthopedic Surgery

## 2021-01-21 ENCOUNTER — Other Ambulatory Visit: Payer: Self-pay

## 2021-01-21 DIAGNOSIS — Z20822 Contact with and (suspected) exposure to covid-19: Secondary | ICD-10-CM | POA: Insufficient documentation

## 2021-01-21 DIAGNOSIS — Z89412 Acquired absence of left great toe: Secondary | ICD-10-CM | POA: Diagnosis not present

## 2021-01-21 DIAGNOSIS — Z89422 Acquired absence of other left toe(s): Secondary | ICD-10-CM | POA: Diagnosis not present

## 2021-01-21 DIAGNOSIS — T8131XA Disruption of external operation (surgical) wound, not elsewhere classified, initial encounter: Secondary | ICD-10-CM | POA: Diagnosis not present

## 2021-01-21 DIAGNOSIS — F17210 Nicotine dependence, cigarettes, uncomplicated: Secondary | ICD-10-CM | POA: Insufficient documentation

## 2021-01-21 DIAGNOSIS — Z96611 Presence of right artificial shoulder joint: Secondary | ICD-10-CM | POA: Diagnosis not present

## 2021-01-21 DIAGNOSIS — Y835 Amputation of limb(s) as the cause of abnormal reaction of the patient, or of later complication, without mention of misadventure at the time of the procedure: Secondary | ICD-10-CM | POA: Diagnosis not present

## 2021-01-21 DIAGNOSIS — T8781 Dehiscence of amputation stump: Secondary | ICD-10-CM | POA: Insufficient documentation

## 2021-01-21 DIAGNOSIS — Z79899 Other long term (current) drug therapy: Secondary | ICD-10-CM | POA: Diagnosis not present

## 2021-01-21 DIAGNOSIS — M86272 Subacute osteomyelitis, left ankle and foot: Secondary | ICD-10-CM | POA: Diagnosis not present

## 2021-01-21 DIAGNOSIS — T8131XD Disruption of external operation (surgical) wound, not elsewhere classified, subsequent encounter: Secondary | ICD-10-CM

## 2021-01-21 HISTORY — DX: Unspecified osteoarthritis, unspecified site: M19.90

## 2021-01-21 HISTORY — DX: Depression, unspecified: F32.A

## 2021-01-21 HISTORY — PX: AMPUTATION: SHX166

## 2021-01-21 LAB — GLUCOSE, CAPILLARY
Glucose-Capillary: 183 mg/dL — ABNORMAL HIGH (ref 70–99)
Glucose-Capillary: 156 mg/dL — ABNORMAL HIGH (ref 70–99)

## 2021-01-21 LAB — CBC
HCT: 44.8 % (ref 39.0–52.0)
Hemoglobin: 14.7 g/dL (ref 13.0–17.0)
MCH: 29.8 pg (ref 26.0–34.0)
MCHC: 32.8 g/dL (ref 30.0–36.0)
MCV: 90.7 fL (ref 80.0–100.0)
Platelets: 499 10*3/uL — ABNORMAL HIGH (ref 150–400)
RBC: 4.94 MIL/uL (ref 4.22–5.81)
RDW: 12.4 % (ref 11.5–15.5)
WBC: 12 10*3/uL — ABNORMAL HIGH (ref 4.0–10.5)
nRBC: 0 % (ref 0.0–0.2)

## 2021-01-21 LAB — BASIC METABOLIC PANEL
Anion gap: 10 (ref 5–15)
BUN: 11 mg/dL (ref 6–20)
CO2: 25 mmol/L (ref 22–32)
Calcium: 9.6 mg/dL (ref 8.9–10.3)
Chloride: 103 mmol/L (ref 98–111)
Creatinine, Ser: 0.73 mg/dL (ref 0.61–1.24)
GFR, Estimated: >60 mL/min (ref >60)
Glucose, Bld: 178 mg/dL — ABNORMAL HIGH (ref 70–99)
Potassium: 4.3 mmol/L (ref 3.5–5.1)
Sodium: 138 mmol/L (ref 135–145)

## 2021-01-21 LAB — SARS CORONAVIRUS 2 BY RT PCR (HOSPITAL ORDER, PERFORMED IN ~~LOC~~ HOSPITAL LAB): SARS Coronavirus 2: NEGATIVE

## 2021-01-21 SURGERY — AMPUTATION, FOOT, RAY
Anesthesia: Monitor Anesthesia Care | Site: Foot | Laterality: Left

## 2021-01-21 MED ORDER — ORAL CARE MOUTH RINSE: 15.0000 mL | Freq: Once | OROMUCOSAL | Status: AC

## 2021-01-21 MED ORDER — LIDOCAINE 2% (20 MG/ML) 5 ML SYRINGE
INTRAMUSCULAR | Status: AC
  Filled 2021-01-21: qty 5

## 2021-01-21 MED ORDER — MIDAZOLAM HCL 2 MG/2ML IJ SOLN: 1.0000 mg | Freq: Once | INTRAMUSCULAR | Status: AC

## 2021-01-21 MED ORDER — FENTANYL CITRATE (PF) 100 MCG/2ML IJ SOLN: 25.0000 ug | INTRAMUSCULAR | Status: DC | PRN

## 2021-01-21 MED ORDER — MIDAZOLAM HCL 2 MG/2ML IJ SOLN
INTRAMUSCULAR | Status: AC
  Administered 2021-01-21: 1 mg via INTRAVENOUS
  Filled 2021-01-21: qty 2

## 2021-01-21 MED ORDER — FENTANYL CITRATE (PF) 100 MCG/2ML IJ SOLN: 50.0000 ug | Freq: Once | INTRAMUSCULAR | Status: AC

## 2021-01-21 MED ORDER — PROPOFOL 10 MG/ML IV BOLUS
INTRAVENOUS | Status: DC | PRN
  Administered 2021-01-21: 10 mg via INTRAVENOUS

## 2021-01-21 MED ORDER — FENTANYL CITRATE (PF) 250 MCG/5ML IJ SOLN
INTRAMUSCULAR | Status: AC
  Filled 2021-01-21: qty 5

## 2021-01-21 MED ORDER — CEFAZOLIN SODIUM-DEXTROSE 2-4 GM/100ML-% IV SOLN
2.0000 g | INTRAVENOUS | Status: AC
  Administered 2021-01-21: 2 g via INTRAVENOUS
  Filled 2021-01-21: qty 100

## 2021-01-21 MED ORDER — ONDANSETRON HCL 4 MG/2ML IJ SOLN
INTRAMUSCULAR | Status: AC
  Filled 2021-01-21: qty 2

## 2021-01-21 MED ORDER — OXYCODONE HCL 5 MG/5ML PO SOLN
5.0000 mg | Freq: Once | ORAL | Status: DC | PRN
Start: 2021-01-21 — End: 2021-01-21

## 2021-01-21 MED ORDER — BUPIVACAINE HCL (PF) 0.5 % IJ SOLN
INTRAMUSCULAR | Status: DC | PRN
  Administered 2021-01-21: 30 mL via PERINEURAL

## 2021-01-21 MED ORDER — FENTANYL CITRATE (PF) 100 MCG/2ML IJ SOLN
INTRAMUSCULAR | Status: DC | PRN
  Administered 2021-01-21: 50 ug via INTRAVENOUS

## 2021-01-21 MED ORDER — ONDANSETRON HCL 4 MG/2ML IJ SOLN
INTRAMUSCULAR | Status: DC | PRN
  Administered 2021-01-21: 4 mg via INTRAVENOUS

## 2021-01-21 MED ORDER — CHLORHEXIDINE GLUCONATE 0.12 % MT SOLN: 15.0000 mL | Freq: Once | OROMUCOSAL | Status: AC

## 2021-01-21 MED ORDER — LACTATED RINGERS IV SOLN: INTRAVENOUS | Status: DC

## 2021-01-21 MED ORDER — PROPOFOL 500 MG/50ML IV EMUL
INTRAVENOUS | Status: DC | PRN
  Administered 2021-01-21: 100 ug/kg/min via INTRAVENOUS

## 2021-01-21 MED ORDER — LACTATED RINGERS IV SOLN: INTRAVENOUS | Status: DC | PRN

## 2021-01-21 MED ORDER — 0.9 % SODIUM CHLORIDE (POUR BTL) OPTIME
TOPICAL | Status: DC | PRN
  Administered 2021-01-21: 1000 mL

## 2021-01-21 MED ORDER — FENTANYL CITRATE (PF) 100 MCG/2ML IJ SOLN
INTRAMUSCULAR | Status: AC
  Administered 2021-01-21: 50 ug via INTRAVENOUS
  Filled 2021-01-21: qty 2

## 2021-01-21 MED ORDER — PROMETHAZINE HCL 25 MG/ML IJ SOLN: 6.2500 mg | INTRAMUSCULAR | Status: DC | PRN

## 2021-01-21 MED ORDER — OXYCODONE HCL 5 MG PO TABS: 5.0000 mg | ORAL_TABLET | Freq: Once | ORAL | Status: DC | PRN

## 2021-01-21 MED ORDER — CHLORHEXIDINE GLUCONATE 0.12 % MT SOLN
OROMUCOSAL | Status: AC
  Administered 2021-01-21: 15 mL via OROMUCOSAL
  Filled 2021-01-21: qty 15

## 2021-01-21 SURGICAL SUPPLY — 32 items
BLADE SAW SGTL 18.5X63.X.64 HD (BLADE) ×2 IMPLANT
BLADE SAW SGTL MED 73X18.5 STR (BLADE) IMPLANT
BLADE SURG 21 STRL SS (BLADE) ×4 IMPLANT
BNDG COHESIVE 4X5 TAN STRL (GAUZE/BANDAGES/DRESSINGS) ×2 IMPLANT
BNDG COHESIVE 6X5 TAN STRL LF (GAUZE/BANDAGES/DRESSINGS) ×2 IMPLANT
BNDG GAUZE ELAST 4 BULKY (GAUZE/BANDAGES/DRESSINGS) ×2 IMPLANT
COVER SURGICAL LIGHT HANDLE (MISCELLANEOUS) ×4 IMPLANT
COVER WAND RF STERILE (DRAPES) ×2 IMPLANT
DRAPE DERMATAC (DRAPES) ×4 IMPLANT
DRAPE U-SHAPE 47X51 STRL (DRAPES) ×4 IMPLANT
DRSG ADAPTIC 3X8 NADH LF (GAUZE/BANDAGES/DRESSINGS) ×2 IMPLANT
DRSG PAD ABDOMINAL 8X10 ST (GAUZE/BANDAGES/DRESSINGS) ×4 IMPLANT
DURAPREP 26ML APPLICATOR (WOUND CARE) ×2 IMPLANT
ELECT REM PT RETURN 9FT ADLT (ELECTROSURGICAL) ×2
ELECTRODE REM PT RTRN 9FT ADLT (ELECTROSURGICAL) ×1 IMPLANT
GAUZE SPONGE 4X4 12PLY STRL (GAUZE/BANDAGES/DRESSINGS) ×2 IMPLANT
GLOVE BIOGEL PI IND STRL 9 (GLOVE) ×1 IMPLANT
GLOVE BIOGEL PI INDICATOR 9 (GLOVE) ×1
GLOVE SURG ORTHO 9.0 STRL STRW (GLOVE) ×2 IMPLANT
GOWN STRL REUS W/ TWL XL LVL3 (GOWN DISPOSABLE) ×2 IMPLANT
GOWN STRL REUS W/TWL XL LVL3 (GOWN DISPOSABLE) ×4
KIT BASIN OR (CUSTOM PROCEDURE TRAY) ×2 IMPLANT
KIT PREVENA INCISION MGT 13 (CANNISTER) ×2 IMPLANT
KIT TURNOVER KIT B (KITS) ×2 IMPLANT
NS IRRIG 1000ML POUR BTL (IV SOLUTION) ×2 IMPLANT
PACK ORTHO EXTREMITY (CUSTOM PROCEDURE TRAY) ×2 IMPLANT
PAD ARMBOARD 7.5X6 YLW CONV (MISCELLANEOUS) ×4 IMPLANT
STOCKINETTE IMPERVIOUS LG (DRAPES) IMPLANT
SUT ETHILON 2 0 PSLX (SUTURE) ×2 IMPLANT
TOWEL GREEN STERILE (TOWEL DISPOSABLE) ×2 IMPLANT
TUBE CONNECTING 12X1/4 (SUCTIONS) ×2 IMPLANT
YANKAUER SUCT BULB TIP NO VENT (SUCTIONS) ×2 IMPLANT

## 2021-01-21 NOTE — Op Note (Signed)
01/21/2021  11:58 AM  PATIENT:  Justin Landry    PRE-OPERATIVE DIAGNOSIS:  Dehiscence Wound Left Foot  POST-OPERATIVE DIAGNOSIS:  Same  PROCEDURE:  1ST AND 2ND RAY AMPUTATION LEFT FOOT Local tissue rearrangement for wound closure 11 x 4 cm. Application of Prevena 13 cm wound VAC.  SURGEON:  [name], MD  PHYSICIAN ASSISTANT:None ANESTHESIA:   General  PREOPERATIVE INDICATIONS:  [name] is a  60 y.o. male with a diagnosis of Dehiscence Wound Left Foot who failed conservative measures and elected for surgical management.    The risks benefits and alternatives were discussed with the patient preoperatively including but not limited to the risks of infection, bleeding, nerve injury, cardiopulmonary complications, the need for revision surgery, among others, and the patient was willing to proceed.  OPERATIVE IMPLANTS: 13 cm Prevena wound VAC  @ENCIMAGES @  OPERATIVE FINDINGS: Good petechial bleeding along the wound edges no bleeding from the dorsalis pedis artery.  No abscess no necrotic tissue  OPERATIVE PROCEDURE: Patient was brought the operating room after undergoing a regional anesthetic.  After adequate levels anesthesia were obtained patient's left lower extremity was prepped using DuraPrep draped into a sterile field a timeout was called.  A racquet incision was made around the ulcerative tissue to include the first and second rays.  This left a wound that was 11 x 4 cm.  The first ray was resected through the base the second ray was resected through the midshaft.  This was resected as 1 block of tissue the wound edges were healthy and viable the wound was irrigated with normal saline electrocautery was used hemostasis of note the branch of the dorsalis pedis artery did not bleed at the anastomosis of the first webspace.  Local tissue rearrangement was used to close the wound 11 x 4 cm.  2-0 nylon was used for wound closure.  A Prevena wound VAC was applied  this was outlined with derma tack covered with Covan this had a good suction fit patient was taken the PACU in stable condition.   DISCHARGE PLANNING:  Antibiotic duration: Preoperative antibiotics only  Weightbearing: Nonweightbearing on the left  Pain medication: Prescription for Percocet  Dressing care/ Wound VAC: Continue wound VAC for 1 week  Ambulatory devices: Walker or crutches  Discharge to: Home.  Follow-up: In the office 1 week post operative.

## 2021-01-21 NOTE — Anesthesia Preprocedure Evaluation (Addendum)
Anesthesia Evaluation  Patient identified by MRN, date of birth, ID band Patient awake    Reviewed: Allergy & Precautions, NPO status , Patient's Chart, lab work & pertinent test results  History of Anesthesia Complications Negative for: history of anesthetic complications  Airway Mallampati: II  TM Distance: >3 FB Neck ROM: Full    Dental  (+) Missing,    Pulmonary COPD,  COPD inhaler, Current Smoker,    Pulmonary exam normal        Cardiovascular hypertension, Pt. on medications Normal cardiovascular exam     Neuro/Psych Anxiety Depression negative neurological ROS     GI/Hepatic negative GI ROS, Neg liver ROS,   Endo/Other  diabetes, Type 2, Oral Hypoglycemic Agents  Renal/GU negative Renal ROS  negative genitourinary   Musculoskeletal  (+) Arthritis ,   Abdominal   Peds  Hematology negative hematology ROS (+)   Anesthesia Other Findings Day of surgery medications reviewed with patient.  Reproductive/Obstetrics negative OB ROS                            Anesthesia Physical Anesthesia Plan  ASA: III  Anesthesia Plan: MAC and Regional   Post-op Pain Management:    Induction:   PONV Risk Score and Plan: 0 and Treatment may vary due to age or medical condition, Propofol infusion and Ondansetron  Airway Management Planned: Natural Airway and Nasal Cannula  Additional Equipment: None  Intra-op Plan:   Post-operative Plan:   Informed Consent: I have reviewed the patients History and Physical, chart, labs and discussed the procedure including the risks, benefits and alternatives for the proposed anesthesia with the patient or authorized representative who has indicated his/her understanding and acceptance.       Plan Discussed with: CRNA  Anesthesia Plan Comments:        Anesthesia Quick Evaluation

## 2021-01-21 NOTE — H&P (Signed)
Justin Landry is an 60 y.o. male.   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  Patient is still on doxycycline he has been full weightbearing he states he slipped in the shower and struck his foot causing the wound to open.  Patient states that he is still smoking's had 3 cigarettes this morning.   Past Medical History:  Diagnosis Date  . Arthritis    right shoulder  . Depression   . Diabetes mellitus    Type II  . Headache    migraines  . High cholesterol   . Humerus fracture    right  . Hypertension   . Inguinal hernia   . Neuromuscular disorder (HCC)    neuropathy    Past Surgical History:  Procedure Laterality Date  . AMPUTATION Left 12/10/2020   Procedure: LEFT GREAT TOE AMPUTATION;  Surgeon: [name], MD;  Location: [facility] OR;  Service: Orthopedics;  Laterality: Left;  . AMPUTATION TOE Left 06/23/2019   Procedure: AMPUTATION TOE LEFT AMPUTATION;  Surgeon: Linus Galas, DPM;  Location: [facility] ORS;  Service: Podiatry;  Laterality: Left;  . AMPUTATION TOE Left 08/31/2019   Procedure: AMPUTATION TOE  IPJ [id number];  Surgeon: Linus Galas, DPM;  Location: [facility] ORS;  Service: Podiatry;  Laterality: Left;  . CYSTOSCOPY  2015   Biopsy  . HERNIA REPAIR Right 2008   [facility]  . REVERSE SHOULDER ARTHROPLASTY Right 09/18/2020   Procedure: RIGHT REVERSE SHOULDER ARTHROPLASTY;  Surgeon: Cammy Copa, MD;  Location[facility] OR;  Service: Orthopedics;  Laterality: Right;    Family History  Problem Relation Age of Onset  . Diabetes Mother   . Hypertension Mother   . Diabetes Father   . Hypertension Father   . Cancer Brother        brain   Social History:  reports that he has been smoking cigarettes. He has a 10.75 pack-year smoking history. He has never used smokeless tobacco. He reports current alcohol use of about 2.0 standard drinks of alcohol per week. He  reports current drug use. Frequency: 7.00 times per week. Drug: Marijuana.  Allergies:  Allergies  Allergen Reactions  . Anacin-3 [Acetaminophen] Nausea And Vomiting  . Morphine And Related Nausea And Vomiting and Other (See Comments)    Extreme sweating   . Oxycontin [Oxycodone Hcl] Other (See Comments)    Extreme NAusea and vomitting follows (Patient Tolerates Percocet short acting)  . Penicillins Nausea And Vomiting    Has patient had a PCN reaction causing immediate rash, facial/tongue/throat swelling, SOB or lightheadedness with hypotension: No Has patient had a PCN reaction causing severe rash involving mucus membranes or skin necrosis: No Has patient had a PCN reaction that required hospitalization No Has patient had a PCN reaction occurring within the last 10 years: No If all of the above answers are "NO", then may proceed with Cephalosporin use.   . Tramadol Nausea Only    No medications prior to admission.    No results found for this or any previous visit (from the past 48 hour(s)). No results found.  Review of Systems  All other systems reviewed and are negative.   Height 5\' 7"  (1.702 m), weight 90.7 kg. Physical Exam  Patient is alert, oriented, no adenopathy, well-dressed, normal affect, normal respiratory effort. Examination patient has cellulitis over the medial aspect of the left foot there is no purulent drainage  there is bruising and dehiscence of the wound from the blunt trauma the first metatarsal head is completely exposed.  His previous partial second toe amputation is healed nicely.  The Doppler was used and he has a strong biphasic dorsalis pedis and posterior tibial pulse.Heart RRR Lungs Clear  Assessment/Plan . Dehiscence of amputation stump (HCC)     Plan: Discussed that we will need to proceed with revision surgery amputation of partial first and second ray with wound closure continue the antibiotics we will call in a prescription for Percocet.   Plan for surgery on Wednesday with discharge to home after surgery discussed the importance of strict nonweightbearing with his walker and the importance of smoking cessation for wound healing.   [place] Pansie Guggisberg, PA 01/21/2021, 6:39 AM

## 2021-01-21 NOTE — Anesthesia Postprocedure Evaluation (Signed)
Anesthesia Post Note  Patient: Justin Landry  Procedure(s) Performed: 1ST AND 2ND RAY AMPUTATION LEFT FOOT (Left Foot)     Patient location during evaluation: PACU Anesthesia Type: Regional and MAC Level of consciousness: awake and alert and oriented Pain management: pain level controlled Vital Signs Assessment: post-procedure vital signs reviewed and stable Respiratory status: spontaneous breathing, nonlabored ventilation and respiratory function stable Cardiovascular status: blood pressure returned to baseline Postop Assessment: no apparent nausea or vomiting Anesthetic complications: no   No complications documented.  Last Vitals:  Vitals:   01/21/21 1200 01/21/21 1215  BP: 125/89 137/72  Pulse: 79 78  Resp: 15 16  Temp:    SpO2: 95% 96%    Last Pain:  Vitals:   01/21/21 1215  TempSrc:   PainSc: 0-No pain                 Brennan Bailey

## 2021-01-21 NOTE — OR Nursing (Signed)
Pt is awake,alert and oriented. Pt is in NAD at this time. Pt and family verbalized understanding of poc and discharge instructions. instructions given to family and reviewed prior to discharge.Pt is ambulatory to wheelchair with stand by assist but not needed with steady gait.  Pt taken to front lobby and placed in car with family.   Wound vac education given, wound vac rewrapped and working at this time

## 2021-01-21 NOTE — Anesthesia Procedure Notes (Signed)
Anesthesia Regional Block: Ankle block   Pre-Anesthetic Checklist: ,, timeout performed, Correct Patient, Correct Site, Correct Laterality, Correct Procedure, Correct Position, site marked, Risks and benefits discussed, pre-op evaluation,  At surgeon's request and post-op pain management  Laterality: Left  Prep: Maximum Sterile Barrier Precautions used, chloraprep       Needles:  Injection technique: Single-shot  Needle Type: Echogenic Needle     Needle Length: 4cm  Needle Gauge: 25     Additional Needles:   Narrative:  Start time: 01/21/2021 10:31 AM End time: 01/21/2021 10:35 AM  Performed by: Personally  Anesthesiologist: Kaylyn Layer, MD  Additional Notes: Risks, benefits, and alternative discussed. Patient gave consent for procedure. Patient prepped and draped in sterile fashion. Sedation administered, patient remains easily responsive to voice. Local anesthetic given in 5cc increments with no signs or symptoms of intravascular injection. No pain or paraesthesias with injection. Patient monitored throughout procedure with signs of LAST or immediate complications. Tolerated well.   Justin Greenhouse, MD

## 2021-01-21 NOTE — Anesthesia Procedure Notes (Signed)
Procedure Name: MAC Date/Time: 01/21/2021 11:15 AM Performed by: Harden Mo, CRNA Pre-anesthesia Checklist: Patient identified, Emergency Drugs available, Suction available and Patient being monitored Patient Re-evaluated:Patient Re-evaluated prior to induction Oxygen Delivery Method: Simple face mask Preoxygenation: Pre-oxygenation with 100% oxygen Induction Type: IV induction Placement Confirmation: positive ETCO2 and breath sounds checked- equal and bilateral Dental Injury: Teeth and Oropharynx as per pre-operative assessment

## 2021-01-21 NOTE — Interval H&P Note (Signed)
History and Physical Interval Note:  01/21/2021 11:04 AM  Justin Landry  has presented today for surgery, with the diagnosis of Dehiscence Wound Left Foot.  The various methods of treatment have been discussed with the patient and family. After consideration of risks, benefits and other options for treatment, the patient has consented to  Procedure(s): 1ST AND 2ND RAY AMPUTATION LEFT FOOT (Left) as a surgical intervention.  The patient's history has been reviewed, patient examined, no change in status, stable for surgery.  I have reviewed the patient's chart and labs.  Questions were answered to the patient's satisfaction.     [name]

## 2021-01-21 NOTE — Transfer of Care (Signed)
Immediate Anesthesia Transfer of Care Note  Patient: Justin Landry  Procedure(s) Performed: 1ST AND 2ND RAY AMPUTATION LEFT FOOT (Left Foot)  Patient Location: PACU  Anesthesia Type:MAC and Regional  Level of Consciousness: awake and alert   Airway & Oxygen Therapy: Patient Spontanous Breathing  Post-op Assessment: Report given to RN and Post -op Vital signs reviewed and stable  Post vital signs: Reviewed and stable  Last Vitals:  Vitals Value Taken Time  BP    Temp    Pulse    Resp    SpO2      Last Pain:  Vitals:   01/21/21 0940  TempSrc:   PainSc: 10-Worst pain ever      Patients Stated Pain Goal: 5 (32/20/25 4270)  Complications: No complications documented.

## 2021-01-22 ENCOUNTER — Other Ambulatory Visit: Payer: Self-pay | Admitting: Physician Assistant

## 2021-01-22 ENCOUNTER — Encounter (HOSPITAL_COMMUNITY): Payer: Self-pay | Admitting: Orthopedic Surgery

## 2021-01-22 ENCOUNTER — Telehealth: Payer: Self-pay

## 2021-01-22 NOTE — Telephone Encounter (Signed)
I called pt and he advised  that he just had surgery yesterday. Advised that he was given rx 2/7 and was to take 1 q8 hrs prn and he states that he has taken more than was prescribed. I advised that he is only to take medication as directed and he can call on 01/26/21 for next refill (narcotics data base pt has had a total of 180 tabs in the past 4 weeks)

## 2021-01-22 NOTE — Telephone Encounter (Signed)
Pt is s/p a 1st and 2nd ray amputation 01/21/21 and requesting refill on pain medication last given 01/19/21 #30 please advise.

## 2021-01-22 NOTE — Telephone Encounter (Signed)
Patient called he is requesting rx refill for oxycodone and doxycycline call [phone]

## 2021-01-22 NOTE — Telephone Encounter (Signed)
Just had refill 2/7 Can have refill 2/14. He shouldn't need doxycycline  but will evaluate at next post op.

## 2021-01-26 ENCOUNTER — Telehealth: Payer: Self-pay | Admitting: Orthopedic Surgery

## 2021-01-26 ENCOUNTER — Other Ambulatory Visit: Payer: Self-pay | Admitting: Physician Assistant

## 2021-01-26 MED ORDER — OXYCODONE-ACETAMINOPHEN 5-325 MG PO TABS: 1.0000 | ORAL_TABLET | Freq: Three times a day (TID) | ORAL | 0 refills | Status: DC | PRN

## 2021-01-26 NOTE — Telephone Encounter (Signed)
Doesn't need doxycycline. Had clean margins at surgery. Rx Percocet called in . Next refill cant be before 2/24

## 2021-01-26 NOTE — Telephone Encounter (Signed)
Patient called. He would like a refill on oxycodone and doxycycline called in to [facility] on [address]. His call back number is [phone]

## 2021-01-26 NOTE — Telephone Encounter (Signed)
I called and sw pt to advise of message below and states that the bottom of his foot is very painful and the he does not know why. Advised to elevate, continue to remain non weight bearing and we will evaluate at his office visit tomorrow.

## 2021-01-26 NOTE — Telephone Encounter (Signed)
01/21/21 left 1st and second ray amputation. Pt requesting refill below.

## 2021-01-27 ENCOUNTER — Encounter: Payer: Self-pay | Admitting: Orthopedic Surgery

## 2021-01-27 ENCOUNTER — Ambulatory Visit (INDEPENDENT_AMBULATORY_CARE_PROVIDER_SITE_OTHER): Payer: Self-pay | Admitting: Physician Assistant

## 2021-01-27 DIAGNOSIS — T8781 Dehiscence of amputation stump: Secondary | ICD-10-CM

## 2021-01-27 MED ORDER — DOXYCYCLINE MONOHYDRATE 100 MG PO TABS: 100.0000 mg | ORAL_TABLET | Freq: Two times a day (BID) | ORAL | 0 refills | Status: DC

## 2021-01-27 MED ORDER — NITROGLYCERIN 0.2 MG/HR TD PT24: 0.2000 mg | MEDICATED_PATCH | Freq: Every day | TRANSDERMAL | 12 refills | Status: DC

## 2021-01-27 MED ORDER — PENTOXIFYLLINE ER 400 MG PO TBCR: 400.0000 mg | EXTENDED_RELEASE_TABLET | Freq: Three times a day (TID) | ORAL | 3 refills | Status: DC

## 2021-01-27 NOTE — Progress Notes (Signed)
Office Visit Note   Patient: Justin Landry           Date of Birth: 1961-05-24           MRN: [id number] Visit Date: 01/27/2021              Requested by: Rolm Gala, NP [address] [place],  [place] [address] PCP: Rolm Gala, NP  Chief Complaint  Patient presents with  . Left Foot - Routine Post Op    01/21/21 1st and 2nd ray amputation left foot       HPI: Patient is 1 week status post left foot first and second ray amputation.  He is having a lot of pain around the incision site.  Denies any fever chills  Assessment & Plan: Visit Diagnoses: No diagnosis found.  Plan: I have recommended beginning a Trental regime as well as a nitroglycerin patch.  Once again discussed smoking cessation.  Continue not weightbearing follow-up in 1 week  Follow-Up Instructions: No follow-ups on file.   Ortho Exam  Patient is alert, oriented, no adenopathy, well-dressed, normal affect, normal respiratory effort.  Examination wound VAC was removed he has skin maceration across the whole wound.  There is 1 area of necrosis.  Mild foul odor but could be consistent with removing wound VAC no foul drainage.  No ascending cellulitis   Imaging: No results found. No images are attached to the encounter.  Labs: Lab Results  Component Value Date   HGBA1C 7.5 (H) 09/24/2020   HGBA1C 7.2 (H) 09/10/2020   HGBA1C 7.6 (H) 06/25/2020   REPTSTATUS 09/11/2020 FINAL 09/10/2020   GRAMSTAIN  06/23/2019    FEW WBC PRESENT, PREDOMINANTLY PMN RARE GRAM POSITIVE COCCI    CULT (A) 09/10/2020    <10,000 COLONIES/mL INSIGNIFICANT GROWTH Performed at [facility], [address], [place], [place] [address]    LABORGA LECLERCIA ADECARBOXYLATA 06/23/2019   LABORGA STAPHYLOCOCCUS CAPRAE 06/23/2019   LABORGA STAPHYLOCOCCUS SIMULANS 06/23/2019     Lab Results  Component Value Date   ALBUMIN 4.2 09/10/2020   ALBUMIN 4.5 07/14/2020   ALBUMIN 4.5 06/25/2020     No results found for: MG No results found for: VD25OH  No results found for: PREALBUMIN CBC EXTENDED Latest Ref Rng & Units 01/21/2021 12/10/2020 09/10/2020  WBC 4.0 - 10.5 K/uL 12.0(H) 15.5(H) 12.1(H)  RBC 4.22 - 5.81 MIL/uL 4.94 4.83 5.22  HGB 13.0 - 17.0 g/dL 86.5 78.4 69.6  HCT 29.5 - 52.0 % 44.8 43.7 47.8  PLT 150 - 400 K/uL 499(H) 367 303  NEUTROABS 1.7 - 7.7 K/uL - - -  LYMPHSABS 0.7 - 4.0 K/uL - - -     There is no height or weight on file to calculate BMI.  Orders:  No orders of the defined types were placed in this encounter.  Meds ordered this encounter  Medications  . nitroGLYCERIN (NITRODUR - DOSED IN MG/24 HR) 0.2 mg/hr patch    Sig: Place 1 patch (0.2 mg total) onto the skin daily.    Dispense:  10 patch    Refill:  12  . pentoxifylline (TRENTAL) 400 MG CR tablet    Sig: Take 1 tablet (400 mg total) by mouth 3 (three) times daily with meals.    Dispense:  30 tablet    Refill:  3  . doxycycline (ADOXA) 100 MG tablet    Sig: Take 1 tablet (100 mg total) by mouth 2 (two) times  daily.    Dispense:  14 tablet    Refill:  0     Procedures: No procedures performed  Clinical Data: No additional findings.  ROS:  All other systems negative, except as noted in the HPI. Review of Systems  Objective: Vital Signs: There were no vitals taken for this visit.  Specialty Comments:  No specialty comments available.  PMFS History: Patient Active Problem List   Diagnosis Date Noted  . Subacute osteomyelitis of left foot (HCC)   . Rupture of operation wound   . Osteomyelitis of great toe of left foot (HCC)   . Overgrown toenails 10/30/2020  . Pre-operative clearance 09/16/2020  . Abnormal EKG 09/16/2020  . Chronic left shoulder pain 04/10/2020  . Toe pain 02/21/2020  . Anxiety 02/08/2020  . Left knee pain 02/07/2020  . Inguinodynia, right 11/13/2019  . Elevated lipids 10/04/2019  . Diabetic ulcer of toe of left foot associated with diabetes mellitus due  to underlying condition (HCC) 08/23/2019  . History of right inguinal hernia 08/14/2019  . Encounter to establish care 08/14/2019  . Anxiety 08/14/2019  . Right shoulder pain 08/14/2019  . Cellulitis 06/22/2019  . Essential hypertension 05/27/2015  . Type 2 diabetes mellitus without complication (HCC) 05/27/2015  . Diverticulosis of large intestine without hemorrhage 10/16/2014  . Recurrent right inguinal hernia 09/27/2014  . Tobacco use 08/01/2014  . Hematuria, gross 07/03/2014  . Lower urinary tract symptoms (LUTS) 07/03/2014  . Diabetes mellitus (HCC) 06/25/2014  . Inguinal hernia, bilateral  04/10/2014   Past Medical History:  Diagnosis Date  . Arthritis    right shoulder  . Depression   . Diabetes mellitus    Type II  . Headache    migraines  . High cholesterol   . Humerus fracture    right  . Hypertension   . Inguinal hernia   . Neuromuscular disorder (HCC)    neuropathy    Family History  Problem Relation Age of Onset  . Diabetes Mother   . Hypertension Mother   . Diabetes Father   . Hypertension Father   . Cancer Brother        brain    Past Surgical History:  Procedure Laterality Date  . AMPUTATION Left 12/10/2020   Procedure: LEFT GREAT TOE AMPUTATION;  Surgeon: [name], MD;  Location: [facility] OR;  Service: Orthopedics;  Laterality: Left;  . AMPUTATION Left 01/21/2021   Procedure: 1ST AND 2ND RAY AMPUTATION LEFT FOOT;  Surgeon: [name], MD;  Location: [facility] OR;  Service: Orthopedics;  Laterality: Left;  . AMPUTATION TOE Left 06/23/2019   Procedure: AMPUTATION TOE LEFT AMPUTATION;  Surgeon: Linus Galas, DPM;  Location: [facility] ORS;  Service: Podiatry;  Laterality: Left;  . AMPUTATION TOE Left 08/31/2019   Procedure: AMPUTATION TOE  IPJ [id number];  Surgeon: Linus Galas, DPM;  Location: [facility] ORS;  Service: Podiatry;  Laterality: Left;  . CYSTOSCOPY  2015   Biopsy  . HERNIA REPAIR Right 2008   [facility]  . REVERSE SHOULDER ARTHROPLASTY Right 09/18/2020    Procedure: RIGHT REVERSE SHOULDER ARTHROPLASTY;  Surgeon: Cammy Copa, MD;  Location: [facility] OR;  Service: Orthopedics;  Laterality: Right;   Social History   Occupational History  . Occupation: unemployed  Tobacco Use  . Smoking status: Current Every Day Smoker    Packs/day: 0.25    Years: 43.00    Pack years: 10.75    Types: Cigarettes  . Smokeless tobacco: Never Used  . Tobacco comment:  cut down to quarter pack a day, aware of resources  Vaping Use  . Vaping Use: Never used  Substance and Sexual Activity  . Alcohol use: Yes    Alcohol/week: 2.0 standard drinks    Types: 2 Cans of beer per week    Comment: occasional  . Drug use: Yes    Frequency: 7.0 times per week    Types: Marijuana    Comment: daily - for neuropathy  . Sexual activity: Yes

## 2021-01-28 ENCOUNTER — Other Ambulatory Visit: Payer: Self-pay | Admitting: Gerontology

## 2021-01-28 DIAGNOSIS — E119 Type 2 diabetes mellitus without complications: Secondary | ICD-10-CM

## 2021-02-02 ENCOUNTER — Ambulatory Visit: Payer: Self-pay | Admitting: Orthopedic Surgery

## 2021-02-03 ENCOUNTER — Ambulatory Visit (INDEPENDENT_AMBULATORY_CARE_PROVIDER_SITE_OTHER): Payer: Self-pay | Admitting: Orthopedic Surgery

## 2021-02-03 DIAGNOSIS — T8781 Dehiscence of amputation stump: Secondary | ICD-10-CM

## 2021-02-03 MED ORDER — OXYCODONE-ACETAMINOPHEN 5-325 MG PO TABS: 1.0000 | ORAL_TABLET | Freq: Three times a day (TID) | ORAL | 0 refills | Status: DC | PRN

## 2021-02-10 ENCOUNTER — Encounter: Payer: Self-pay | Admitting: Orthopedic Surgery

## 2021-02-10 ENCOUNTER — Encounter: Payer: Self-pay | Admitting: Physician Assistant

## 2021-02-10 ENCOUNTER — Ambulatory Visit (INDEPENDENT_AMBULATORY_CARE_PROVIDER_SITE_OTHER): Payer: Self-pay | Admitting: Physician Assistant

## 2021-02-10 DIAGNOSIS — T8781 Dehiscence of amputation stump: Secondary | ICD-10-CM

## 2021-02-10 MED ORDER — OXYCODONE-ACETAMINOPHEN 5-325 MG PO TABS: 1.0000 | ORAL_TABLET | Freq: Three times a day (TID) | ORAL | 0 refills | Status: DC | PRN

## 2021-02-10 MED ORDER — DOXYCYCLINE MONOHYDRATE 100 MG PO TABS: 100.0000 mg | ORAL_TABLET | Freq: Two times a day (BID) | ORAL | 0 refills | Status: DC

## 2021-02-10 NOTE — Progress Notes (Signed)
Office Visit Note   Patient: Justin Landry           Date of Birth: Apr 06, 1961           MRN: [id number] Visit Date: 02/03/2021              Requested by: Rolm Gala, NP [address] [place],  [place] [address] PCP: Rolm Gala, NP  Chief Complaint  Patient presents with  . Left Foot - Routine Post Op, Wound Check      HPI: Patient is a 60 year old gentleman who is 2 weeks status post left foot first and second ray amputation sutures are intact he is using Trental and a nitroglycerin patch.  Assessment & Plan: Visit Diagnoses:  1. Dehiscence of amputation stump (HCC)     Plan: Prescription for Percocet continue the nitroglycerin and Trental dry dressing change daily.  Follow-Up Instructions: Return in about 1 week (around 02/10/2021).   Ortho Exam  Patient is alert, oriented, no adenopathy, well-dressed, normal affect, normal respiratory effort. Examination patient has ischemic changes to the wound which appear improved.  He has completed his doxycycline there is no cellulitis no odor no drainage no signs of infection.  Imaging: No results found. No images are attached to the encounter.  Labs: Lab Results  Component Value Date   HGBA1C 7.5 (H) 09/24/2020   HGBA1C 7.2 (H) 09/10/2020   HGBA1C 7.6 (H) 06/25/2020   REPTSTATUS 09/11/2020 FINAL 09/10/2020   GRAMSTAIN  06/23/2019    FEW WBC PRESENT, PREDOMINANTLY PMN RARE GRAM POSITIVE COCCI    CULT (A) 09/10/2020    <10,000 COLONIES/mL INSIGNIFICANT GROWTH Performed at [facility], [address] [place], [place] [address]    LABORGA LECLERCIA ADECARBOXYLATA 06/23/2019   LABORGA STAPHYLOCOCCUS CAPRAE 06/23/2019   LABORGA STAPHYLOCOCCUS SIMULANS 06/23/2019     Lab Results  Component Value Date   ALBUMIN 4.2 09/10/2020   ALBUMIN 4.5 07/14/2020   ALBUMIN 4.5 06/25/2020    No results found for: MG No results found for: VD25OH  No results found for: PREALBUMIN CBC  EXTENDED Latest Ref Rng & Units 01/21/2021 12/10/2020 09/10/2020  WBC 4.0 - 10.5 K/uL 12.0(H) 15.5(H) 12.1(H)  RBC 4.22 - 5.81 MIL/uL 4.94 4.83 5.22  HGB 13.0 - 17.0 g/dL 74.0 81.4 48.1  HCT 85.6 - 52.0 % 44.8 43.7 47.8  PLT 150 - 400 K/uL 499(H) 367 303  NEUTROABS 1.7 - 7.7 K/uL - - -  LYMPHSABS 0.7 - 4.0 K/uL - - -     There is no height or weight on file to calculate BMI.  Orders:  No orders of the defined types were placed in this encounter.  Meds ordered this encounter  Medications  . DISCONTD: oxyCODONE-acetaminophen (PERCOCET) 5-325 MG tablet    Sig: Take 1 tablet by mouth 3 (three) times daily as needed.    Dispense:  30 tablet    Refill:  0     Procedures: No procedures performed  Clinical Data: No additional findings.  ROS:  All other systems negative, except as noted in the HPI. Review of Systems  Objective: Vital Signs: There were no vitals taken for this visit.  Specialty Comments:  No specialty comments available.  PMFS History: Patient Active Problem List   Diagnosis Date Noted  . Subacute osteomyelitis of left foot (HCC)   . Rupture of operation wound   . Osteomyelitis of great toe of left foot (HCC)   . Overgrown toenails 10/30/2020  .  Pre-operative clearance 09/16/2020  . Abnormal EKG 09/16/2020  . Chronic left shoulder pain 04/10/2020  . Toe pain 02/21/2020  . Anxiety 02/08/2020  . Left knee pain 02/07/2020  . Inguinodynia, right 11/13/2019  . Elevated lipids 10/04/2019  . 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 History of right inguinal hernia 08/14/2019  . Encounter to establish care 08/14/2019  . Anxiety 08/14/2019  . Right shoulder pain 08/14/2019  . Cellulitis 06/22/2019  . Essential hypertension 05/27/2015  . Type 2 diabetes mellitus without complication (HCC) 05/27/2015  . Diverticulosis of large intestine without hemorrhage 10/16/2014  . Recurrent right  inguinal hernia 09/27/2014  . Tobacco use 08/01/2014  . Hematuria, gross 07/03/2014  . Lower urinary tract symptoms (LUTS) 07/03/2014  . Diabetes mellitus (HCC) 06/25/2014  . Inguinal hernia, bilateral  04/10/2014   Past Medical History:  Diagnosis Date  . Arthritis    right shoulder  . Depression   . Diabetes mellitus    Type II  . Headache    migraines  . High cholesterol   . Humerus fracture    right  . Hypertension   . Inguinal hernia   . Neuromuscular disorder (HCC)    neuropathy    Family History  Problem Relation Age of Onset  . Diabetes Mother   . Hypertension Mother   . Diabetes Father   . Hypertension Father   . Cancer Brother        brain    Past Surgical History:  Procedure Laterality Date  . AMPUTATION Left 12/10/2020   Procedure: LEFT GREAT TOE AMPUTATION;  Surgeon: [name], MD;  Location: [facility] OR;  Service: Orthopedics;  Laterality: Left;  . AMPUTATION Left 01/21/2021   Procedure: 1ST AND 2ND RAY AMPUTATION LEFT FOOT;  Surgeon: [name], MD;  Location: [facility] OR;  Service: Orthopedics;  Laterality: Left;  . AMPUTATION TOE Left 06/23/2019   Procedure: AMPUTATION TOE LEFT AMPUTATION;  Surgeon: Linus Galas, DPM;  Location: [facility] ORS;  Service: Podiatry;  Laterality: Left;  . AMPUTATION TOE Left 08/31/2019   Procedure: AMPUTATION TOE  IPJ 16109;  Surgeon: Linus Galas, DPM;  Location: [facility] ORS;  Service: Podiatry;  Laterality: Left;  . CYSTOSCOPY  2015   Biopsy  . HERNIA REPAIR Right 2008   [facility]  . REVERSE SHOULDER ARTHROPLASTY Right 09/18/2020   Procedure: RIGHT REVERSE SHOULDER ARTHROPLASTY;  Surgeon: Cammy Copa, MD;  Location: [facility] OR;  Service: Orthopedics;  Laterality: Right;   Social History   Occupational History  . Occupation: unemployed  Tobacco Use  . Smoking status: Current Every Day Smoker    Packs/day: 0.25    Years: 43.00    Pack years: 10.75    Types: Cigarettes  . Smokeless tobacco: Never Used  . Tobacco comment: cut  down to quarter pack a day, aware of resources  Vaping Use  . Vaping Use: Never used  Substance and Sexual Activity  . Alcohol use: Yes    Alcohol/week: 2.0 standard drinks    Types: 2 Cans of beer per week    Comment: occasional  . Drug use: Yes    Frequency: 7.0 times per week    Types: Marijuana    Comment: daily - for neuropathy  . Sexual activity: Yes

## 2021-02-10 NOTE — Progress Notes (Signed)
Office Visit Note   Patient: Justin Landry           Date of Birth: 1961/06/19           MRN: [id number] Visit Date: 02/10/2021              Requested by: Rolm Gala, NP [address] [place],  [place] [address] PCP: Rolm Gala, NP  Chief Complaint  Patient presents with  . Left Foot - Routine Post Op    01/21/21 left foot 1st and 2nd ray amputation       HPI: Patient is a pleasant 60 year old gentleman who is 3 weeks status post left foot first ray second ray amputation.  He has been doing daily dressing changes is taking Trental and using a nitroglycerin patch.  He is still continuing to smoke he says he is actually smoking just a little bit more but is trying to cut down  Assessment & Plan: Visit Diagnoses: No diagnosis found.  Plan: Had a discussion with the patient today.  We will place him back on doxycycline because of the erythema.  I also instructed him on where to put the nitroglycerin patch will follow up with Dr. Lajoyce Corners in 1 week.  I do have serious concerns for healing.  We once again discussed the role of smoking with this  Follow-Up Instructions: No follow-ups on file.   Ortho Exam  Patient is alert, oriented, no adenopathy, well-dressed, normal affect, normal respiratory effort. Examination of the wound demonstrates maceration and wound dehiscence throughout most of the wound.  Sutures are in place.  He does have some surrounding erythema but no ascending cellulitis.  1 area of the wound shows nonviable tissue that probes deeply to the bone.  Serous drainage was expressed through this no purulent drainage was expressed no particular foul odor.  Nitroglycerin patch was down at his toes  Imaging: No results found. No images are attached to the encounter.  Labs: Lab Results  Component Value Date   HGBA1C 7.5 (H) 09/24/2020   HGBA1C 7.2 (H) 09/10/2020   HGBA1C 7.6 (H) 06/25/2020   REPTSTATUS 09/11/2020 FINAL 09/10/2020    GRAMSTAIN  06/23/2019    FEW WBC PRESENT, PREDOMINANTLY PMN RARE GRAM POSITIVE COCCI    CULT (A) 09/10/2020    <10,000 COLONIES/mL INSIGNIFICANT GROWTH Performed at [facility], [address], [place], [place] [address]    LABORGA LECLERCIA ADECARBOXYLATA 06/23/2019   LABORGA STAPHYLOCOCCUS CAPRAE 06/23/2019   LABORGA STAPHYLOCOCCUS SIMULANS 06/23/2019     Lab Results  Component Value Date   ALBUMIN 4.2 09/10/2020   ALBUMIN 4.5 07/14/2020   ALBUMIN 4.5 06/25/2020    No results found for: MG No results found for: VD25OH  No results found for: PREALBUMIN CBC EXTENDED Latest Ref Rng & Units 01/21/2021 12/10/2020 09/10/2020  WBC 4.0 - 10.5 K/uL 12.0(H) 15.5(H) 12.1(H)  RBC 4.22 - 5.81 MIL/uL 4.94 4.83 5.22  HGB 13.0 - 17.0 g/dL 93.5 70.1 77.9  HCT 39.0 - 52.0 % 44.8 43.7 47.8  PLT 150 - 400 K/uL 499(H) 367 303  NEUTROABS 1.7 - 7.7 K/uL - - -  LYMPHSABS 0.7 - 4.0 K/uL - - -     There is no height or weight on file to calculate BMI.  Orders:  No orders of the defined types were placed in this encounter.  Meds ordered this encounter  Medications  . oxyCODONE-acetaminophen (PERCOCET) 5-325 MG tablet    Sig: Take 1  tablet by mouth 3 (three) times daily as needed.    Dispense:  30 tablet    Refill:  0  . doxycycline (ADOXA) 100 MG tablet    Sig: Take 1 tablet (100 mg total) by mouth 2 (two) times daily.    Dispense:  14 tablet    Refill:  0     Procedures: No procedures performed  Clinical Data: No additional findings.  ROS:  All other systems negative, except as noted in the HPI. Review of Systems  Objective: Vital Signs: There were no vitals taken for this visit.  Specialty Comments:  No specialty comments available.  PMFS History: Patient Active Problem List   Diagnosis Date Noted  . Subacute osteomyelitis of left foot (HCC)   . Rupture of operation wound   . Osteomyelitis of great toe of left foot (HCC)   . Overgrown toenails 10/30/2020  .  Pre-operative clearance 09/16/2020  . Abnormal EKG 09/16/2020  . Chronic left shoulder pain 04/10/2020  . Toe pain 02/21/2020  . Anxiety 02/08/2020  . Left knee pain 02/07/2020  . Inguinodynia, right 11/13/2019  . Elevated lipids 10/04/2019  . 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 History of right inguinal hernia 08/14/2019  . Encounter to establish care 08/14/2019  . Anxiety 08/14/2019  . Right shoulder pain 08/14/2019  . Cellulitis 06/22/2019  . Essential hypertension 05/27/2015  . Type 2 diabetes mellitus without complication (HCC) 05/27/2015  . Diverticulosis of large intestine without hemorrhage 10/16/2014  . Recurrent right inguinal hernia 09/27/2014  . Tobacco use 08/01/2014  . Hematuria, gross 07/03/2014  . Lower urinary tract symptoms (LUTS) 07/03/2014  . Diabetes mellitus (HCC) 06/25/2014  . Inguinal hernia, bilateral  04/10/2014   Past Medical History:  Diagnosis Date  . Arthritis    right shoulder  . Depression   . Diabetes mellitus    Type II  . Headache    migraines  . High cholesterol   . Humerus fracture    right  . Hypertension   . Inguinal hernia   . Neuromuscular disorder (HCC)    neuropathy    Family History  Problem Relation Age of Onset  . Diabetes Mother   . Hypertension Mother   . Diabetes Father   . Hypertension Father   . Cancer Brother        brain    Past Surgical History:  Procedure Laterality Date  . AMPUTATION Left 12/10/2020   Procedure: LEFT GREAT TOE AMPUTATION;  Surgeon: [name], MD;  Location: [facility] OR;  Service: Orthopedics;  Laterality: Left;  . AMPUTATION Left 01/21/2021   Procedure: 1ST AND 2ND RAY AMPUTATION LEFT FOOT;  Surgeon: [name], MD;  Location: [facility] OR;  Service: Orthopedics;  Laterality: Left;  . AMPUTATION TOE Left 06/23/2019   Procedure: AMPUTATION TOE LEFT AMPUTATION;  Surgeon: Linus Galas, DPM;  Location: [facility] ORS;   Service: Podiatry;  Laterality: Left;  . AMPUTATION TOE Left 08/31/2019   Procedure: AMPUTATION TOE  IPJ 38937;  Surgeon: Linus Galas, DPM;  Location: [facility] ORS;  Service: Podiatry;  Laterality: Left;  . CYSTOSCOPY  2015   Biopsy  . HERNIA REPAIR Right 2008   [facility]  . REVERSE SHOULDER ARTHROPLASTY Right 09/18/2020   Procedure: RIGHT REVERSE SHOULDER ARTHROPLASTY;  Surgeon: Cammy Copa, MD;  Location: [facility] OR;  Service: Orthopedics;  Laterality: Right;   Social History   Occupational History  .  Occupation: unemployed  Tobacco Use  . Smoking status: Current Every Day Smoker    Packs/day: 0.25    Years: 43.00    Pack years: 10.75    Types: Cigarettes  . Smokeless tobacco: Never Used  . Tobacco comment: cut down to quarter pack a day, aware of resources  Vaping Use  . Vaping Use: Never used  Substance and Sexual Activity  . Alcohol use: Yes    Alcohol/week: 2.0 standard drinks    Types: 2 Cans of beer per week    Comment: occasional  . Drug use: Yes    Frequency: 7.0 times per week    Types: Marijuana    Comment: daily - for neuropathy  . Sexual activity: Yes

## 2021-02-12 ENCOUNTER — Ambulatory Visit (INDEPENDENT_AMBULATORY_CARE_PROVIDER_SITE_OTHER): Payer: Self-pay | Admitting: Orthopedic Surgery

## 2021-02-12 DIAGNOSIS — S98112A Complete traumatic amputation of left great toe, initial encounter: Secondary | ICD-10-CM

## 2021-02-12 DIAGNOSIS — Z89412 Acquired absence of left great toe: Secondary | ICD-10-CM

## 2021-02-12 DIAGNOSIS — T8781 Dehiscence of amputation stump: Secondary | ICD-10-CM

## 2021-02-12 MED ORDER — SULFAMETHOXAZOLE-TRIMETHOPRIM 800-160 MG PO TABS: 1.0000 | ORAL_TABLET | Freq: Two times a day (BID) | ORAL | 0 refills | Status: DC

## 2021-02-13 ENCOUNTER — Other Ambulatory Visit: Payer: Self-pay | Admitting: Orthopedic Surgery

## 2021-02-13 ENCOUNTER — Encounter: Payer: Self-pay | Admitting: Orthopedic Surgery

## 2021-02-13 ENCOUNTER — Telehealth: Payer: Self-pay | Admitting: Orthopedic Surgery

## 2021-02-13 MED ORDER — CELECOXIB 200 MG PO CAPS: 200.0000 mg | ORAL_CAPSULE | Freq: Two times a day (BID) | ORAL | 0 refills | Status: DC

## 2021-02-13 NOTE — Telephone Encounter (Signed)
Patient called asked if he can get something for pain called into his pharmacy? The number to contact patient is [phone]

## 2021-02-13 NOTE — Telephone Encounter (Signed)
Pt requesting pain medication last refill of oxycodone 5/325 was 02/10/21 #30 this pt was in the office yesterday s/p 1st and 2nd ray amputation. Please advise.

## 2021-02-13 NOTE — Telephone Encounter (Signed)
I called and advised of message below. Will call with any other questions.

## 2021-02-13 NOTE — Progress Notes (Signed)
Office Visit Note   Patient: Justin Landry           Date of Birth: 1961-07-15           MRN: [id number] Visit Date: 02/12/2021              Requested by: Rolm Gala, NP [address] [place],  [place] [address] PCP: Rolm Gala, NP  Chief Complaint  Patient presents with   Left Foot - Routine Post Op    01/21/21 left foot 1st and 2nd ray amputation       HPI: Patient is a 60 year old gentleman who is 4 weeks status post left foot first and second ray amputation.  Patient states the pain is worse he has completed 2 courses of doxycycline he is using the nitroglycerin patch and Trental.  He is still smoking.  Assessment & Plan: Visit Diagnoses:  1. Dehiscence of amputation stump (HCC)   2. Amputated great toe of left foot (HCC)     Plan: We will call in a prescription for Septra and Percocet patient was given a crew compression sock size medium recommended that he wear a size large compression sock around-the-clock.  Follow-Up Instructions: Return in about 1 week (around 02/19/2021).   Ortho Exam  Patient is alert, oriented, no adenopathy, well-dressed, normal affect, normal respiratory effort. Examination the wound has 50% granulation tissue 50% dry ischemic changes there is no depth to the wound no exposed bone or tendon.  There is no ascending cellulitis no swelling.  Imaging: No results found. No images are attached to the encounter.  Labs: Lab Results  Component Value Date   HGBA1C 7.5 (H) 09/24/2020   HGBA1C 7.2 (H) 09/10/2020   HGBA1C 7.6 (H) 06/25/2020   REPTSTATUS 09/11/2020 FINAL 09/10/2020   GRAMSTAIN  06/23/2019    FEW WBC PRESENT, PREDOMINANTLY PMN RARE GRAM POSITIVE COCCI    CULT (A) 09/10/2020    <10,000 COLONIES/mL INSIGNIFICANT GROWTH Performed at [facility], [address] [place], [place] [address]    LABORGA LECLERCIA ADECARBOXYLATA 06/23/2019   LABORGA STAPHYLOCOCCUS CAPRAE 06/23/2019   LABORGA  STAPHYLOCOCCUS SIMULANS 06/23/2019     Lab Results  Component Value Date   ALBUMIN 4.2 09/10/2020   ALBUMIN 4.5 07/14/2020   ALBUMIN 4.5 06/25/2020    No results found for: MG No results found for: VD25OH  No results found for: PREALBUMIN CBC EXTENDED Latest Ref Rng & Units 01/21/2021 12/10/2020 09/10/2020  WBC 4.0 - 10.5 K/uL 12.0(H) 15.5(H) 12.1(H)  RBC 4.22 - 5.81 MIL/uL 4.94 4.83 5.22  HGB 13.0 - 17.0 g/dL 24.4 01.0 27.2  HCT 53.6 - 52.0 % 44.8 43.7 47.8  PLT 150 - 400 K/uL 499(H) 367 303  NEUTROABS 1.7 - 7.7 K/uL - - -  LYMPHSABS 0.7 - 4.0 K/uL - - -     There is no height or weight on file to calculate BMI.  Orders:  No orders of the defined types were placed in this encounter.  Meds ordered this encounter  Medications   sulfamethoxazole-trimethoprim (BACTRIM DS) 800-160 MG tablet    Sig: Take 1 tablet by mouth 2 (two) times daily.    Dispense:  30 tablet    Refill:  0     Procedures: No procedures performed  Clinical Data: No additional findings.  ROS:  All other systems negative, except as noted in the HPI. Review of Systems  Objective: Vital Signs: There were no vitals taken for this  visit.  Specialty Comments:  No specialty comments available.  PMFS History: Patient Active Problem List   Diagnosis Date Noted   Subacute osteomyelitis of left foot (HCC)    Rupture of operation wound    Osteomyelitis of great toe of left foot (HCC)    Overgrown toenails 10/30/2020   Pre-operative clearance 09/16/2020   Abnormal EKG 09/16/2020   Chronic left shoulder pain 04/10/2020   Toe pain 02/21/2020   Anxiety 02/08/2020   Left knee pain 02/07/2020   Inguinodynia, right 11/13/2019   Elevated lipids 10/04/2019   Diabetic ulcer of toe of left foot associated with diabetes mellitus due to underlying condition (HCC) 08/23/2019   History of right inguinal hernia 08/14/2019   Encounter to establish care 08/14/2019   Anxiety 08/14/2019    Right shoulder pain 08/14/2019   Cellulitis 06/22/2019   Essential hypertension 05/27/2015   Type 2 diabetes mellitus without complication (HCC) 05/27/2015   Diverticulosis of large intestine without hemorrhage 10/16/2014   Recurrent right inguinal hernia 09/27/2014   Tobacco use 08/01/2014   Hematuria, gross 07/03/2014   Lower urinary tract symptoms (LUTS) 07/03/2014   Diabetes mellitus (HCC) 06/25/2014   Inguinal hernia, bilateral  04/10/2014   Past Medical History:  Diagnosis Date   Arthritis    right shoulder   Depression    Diabetes mellitus    Type II   Headache    migraines   High cholesterol    Humerus fracture    right   Hypertension    Inguinal hernia    Neuromuscular disorder (HCC)    neuropathy    Family History  Problem Relation Age of Onset   Diabetes Mother    Hypertension Mother    Diabetes Father    Hypertension Father    Cancer Brother        brain    Past Surgical History:  Procedure Laterality Date   AMPUTATION Left 12/10/2020   Procedure: LEFT GREAT TOE AMPUTATION;  Surgeon: [name], MD;  Location: [facility] OR;  Service: Orthopedics;  Laterality: Left;   AMPUTATION Left 01/21/2021   Procedure: 1ST AND 2ND RAY AMPUTATION LEFT FOOT;  Surgeon: [name], MD;  Location: [facility] OR;  Service: Orthopedics;  Laterality: Left;   AMPUTATION TOE Left 06/23/2019   Procedure: AMPUTATION TOE LEFT AMPUTATION;  Surgeon: Linus Galas, DPM;  Location: [facility] ORS;  Service: Podiatry;  Laterality: Left;   AMPUTATION TOE Left 08/31/2019   Procedure: AMPUTATION TOE  IPJ 28413;  Surgeon: Linus Galas, DPM;  Location: [facility] ORS;  Service: Podiatry;  Laterality: Left;   CYSTOSCOPY  2015   Biopsy   HERNIA REPAIR Right 2008   [facility]   REVERSE SHOULDER ARTHROPLASTY Right 09/18/2020   Procedure: RIGHT REVERSE SHOULDER ARTHROPLASTY;  Surgeon: Cammy Copa, MD;  Location: [facility] OR;  Service: Orthopedics;  Laterality: Right;   Social History    Occupational History   Occupation: unemployed  Tobacco Use   Smoking status: Current Every Day Smoker    Packs/day: 0.25    Years: 43.00    Pack years: 10.75    Types: Cigarettes   Smokeless tobacco: Never Used   Tobacco comment: cut down to quarter pack a day, aware of resources  Vaping Use   Vaping Use: Never used  Substance and Sexual Activity   Alcohol use: Yes    Alcohol/week: 2.0 standard drinks    Types: 2 Cans of beer per week    Comment: occasional   Drug use:  Yes    Frequency: 7.0 times per week    Types: Marijuana    Comment: daily - for neuropathy   Sexual activity: Yes

## 2021-02-13 NOTE — Telephone Encounter (Signed)
Rx for celebrex sent to [facility], cannot call in more narcotics at this time

## 2021-02-17 ENCOUNTER — Ambulatory Visit (INDEPENDENT_AMBULATORY_CARE_PROVIDER_SITE_OTHER): Payer: Self-pay | Admitting: Orthopedic Surgery

## 2021-02-17 DIAGNOSIS — S98112A Complete traumatic amputation of left great toe, initial encounter: Secondary | ICD-10-CM

## 2021-02-17 DIAGNOSIS — Z89412 Acquired absence of left great toe: Secondary | ICD-10-CM

## 2021-02-18 ENCOUNTER — Ambulatory Visit (INDEPENDENT_AMBULATORY_CARE_PROVIDER_SITE_OTHER): Payer: Self-pay | Admitting: Orthopedic Surgery

## 2021-02-18 ENCOUNTER — Other Ambulatory Visit: Payer: Self-pay

## 2021-02-18 ENCOUNTER — Ambulatory Visit (INDEPENDENT_AMBULATORY_CARE_PROVIDER_SITE_OTHER): Payer: Self-pay

## 2021-02-18 DIAGNOSIS — Z96611 Presence of right artificial shoulder joint: Secondary | ICD-10-CM

## 2021-02-18 DIAGNOSIS — M25511 Pain in right shoulder: Secondary | ICD-10-CM

## 2021-02-18 MED ORDER — METHOCARBAMOL 500 MG PO TABS: 500.0000 mg | ORAL_TABLET | Freq: Three times a day (TID) | ORAL | 0 refills | Status: DC | PRN

## 2021-02-18 MED ORDER — OXYCODONE-ACETAMINOPHEN 5-325 MG PO TABS: 1.0000 | ORAL_TABLET | Freq: Two times a day (BID) | ORAL | 0 refills | Status: DC | PRN

## 2021-02-20 ENCOUNTER — Encounter: Payer: Self-pay | Admitting: Orthopedic Surgery

## 2021-02-20 NOTE — Progress Notes (Signed)
Office Visit Note   Patient: Justin Landry           Date of Birth: 1961-10-30           MRN: [id number] Visit Date: 02/18/2021 Requested by: Rolm Gala, NP [address] [place],  [place] [address] PCP: Rolm Gala, NP  Subjective: Chief Complaint  Patient presents with  . Right Shoulder - Pain    HPI: Justin Landry is a 60 y.o. male who presents to the office s/p right reverse shoulder arthroplasty on 09/18/2020.  Patient complains of a lot of increased pain in the last several months.  He notes cramping in his shoulder.  Pain wakes him up at night.  Denies any recent injury with any falls or lifting events.  Most of his pain is in the anterior aspect of the right shoulder.  Denies any neck pain, shoulder blade pain, radicular pain, numbness/tingling.  Denies any fevers, chills, night sweats.  He has had multiple amputations recently by Dr. Lajoyce Corners due to foot wounds.  He is following up with Dr. Lajoyce Corners following the latest amputation and reports that this is going well.  Denies any drainage from the incision..                ROS: All systems reviewed are negative as they relate to the chief complaint within the history of present illness.  Patient denies fevers or chills.  Assessment & Plan: Visit Diagnoses:  1. Right shoulder pain, unspecified chronicity   2. History of arthroplasty of right shoulder     Plan: Patient is a 60 year old male who is about 5 months out from right reverse shoulder arthroplasty.  He feels that he has had increased pain in the last several months.  Denies any injury leading to onset of pain.  He does have history of diabetes and multiple foot infections that required amputation.  However he has no systemic signs of infection today and there are no significant findings suggesting prosthetic joint infection on today's exam.  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  He is  also tender directly over where the bicep tendon was tenodesed at time of surgery.  This may be the source of his increased pain.  Subscap feels intact on exam today.  Plan to send in 1 course of pain medication and muscle relaxers with follow-up in 3 months for clinical recheck.  Encourage patient to follow-up sooner if he has any signs of infection in detail the signs to him.  If he has persistent pain in 3 months, plan to investigate for PJI.  Follow-Up Instructions: No follow-ups on file.   Orders:  Orders Placed This Encounter  Procedures  . XR Shoulder Right   Meds ordered this encounter  Medications  . methocarbamol (ROBAXIN) 500 MG tablet    Sig: Take 1 tablet (500 mg total) by mouth every 8 (eight) hours as needed.    Dispense:  30 tablet    Refill:  0  . oxyCODONE-acetaminophen (PERCOCET) 5-325 MG tablet    Sig: Take 1 tablet by mouth every 12 (twelve) hours as needed.    Dispense:  20 tablet    Refill:  0      Procedures: No procedures performed   Clinical Data: No additional findings.  Objective: Vital Signs: There were no vitals taken for this visit.  Physical Exam:  Constitutional: Patient appears well-developed HEENT:  Head:  Normocephalic Eyes:EOM are normal Neck: Normal range of motion Cardiovascular: Normal rate Pulmonary/chest: Effort normal Neurologic: Patient is alert Skin: Skin is warm Psychiatric: Patient has normal mood and affect  Ortho Exam: Ortho exam demonstrates right shoulder with 40 degrees external rotation, 90 degrees abduction, 120 degrees forward flexion.  Incision is well-healed with no evidence of infection or dehiscence.  Deltoid defect from previous exam is improved.  Decent subscapularis strength but he does have pain with strength testing.  Able to functionally lift his arm up above his head.  Tenderness over the anterior aspect of the right shoulder near the distal aspect of the incision.  Popeye deformity noted that is not present  on the contralateral arm.  No increased warmth of the right shoulder compared with the contralateral shoulder.  No expressible drainage from the incision.  No surrounding erythema.  Specialty Comments:  No specialty comments available.  Imaging: No results found.   PMFS History: Patient Active Problem List   Diagnosis Date Noted  . Subacute osteomyelitis of left foot (HCC)   . Rupture of operation wound   . Osteomyelitis of great toe of left foot (HCC)   . Overgrown toenails 10/30/2020  . Pre-operative clearance 09/16/2020  . Abnormal EKG 09/16/2020  . Chronic left shoulder pain 04/10/2020  . Toe pain 02/21/2020  . Anxiety 02/08/2020  . Left knee pain 02/07/2020  . Inguinodynia, right 11/13/2019  . Elevated lipids 10/04/2019  . 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 History of right inguinal hernia 08/14/2019  . Encounter to establish care 08/14/2019  . Anxiety 08/14/2019  . Right shoulder pain 08/14/2019  . Cellulitis 06/22/2019  . Essential hypertension 05/27/2015  . Type 2 diabetes mellitus without complication (HCC) 05/27/2015  . Diverticulosis of large intestine without hemorrhage 10/16/2014  . Recurrent right inguinal hernia 09/27/2014  . Tobacco use 08/01/2014  . Hematuria, gross 07/03/2014  . Lower urinary tract symptoms (LUTS) 07/03/2014  . Diabetes mellitus (HCC) 06/25/2014  . Inguinal hernia, bilateral  04/10/2014   Past Medical History:  Diagnosis Date  . Arthritis    right shoulder  . Depression   . Diabetes mellitus    Type II  . Headache    migraines  . High cholesterol   . Humerus fracture    right  . Hypertension   . Inguinal hernia   . Neuromuscular disorder (HCC)    neuropathy    Family History  Problem Relation Age of Onset  . Diabetes Mother   . Hypertension Mother   . Diabetes Father   . Hypertension Father   . Cancer Brother        brain    Past  Surgical History:  Procedure Laterality Date  . AMPUTATION Left 12/10/2020   Procedure: LEFT GREAT TOE AMPUTATION;  Surgeon: [name], MD;  Location: [facility] OR;  Service: Orthopedics;  Laterality: Left;  . AMPUTATION Left 01/21/2021   Procedure: 1ST AND 2ND RAY AMPUTATION LEFT FOOT;  Surgeon: [name], MD;  Location: [facility] OR;  Service: Orthopedics;  Laterality: Left;  . AMPUTATION TOE Left 06/23/2019   Procedure: AMPUTATION TOE LEFT AMPUTATION;  Surgeon: Linus Galas, DPM;  Location: [facility] ORS;  Service: Podiatry;  Laterality: Left;  . AMPUTATION TOE Left 08/31/2019   Procedure: AMPUTATION TOE  IPJ [id number];  Surgeon: Linus Galas, DPM;  Location: [facility] ORS;  Service: Podiatry;  Laterality: Left;  . CYSTOSCOPY  2015  Biopsy  . HERNIA REPAIR Right 2008   [facility]  . REVERSE SHOULDER ARTHROPLASTY Right 09/18/2020   Procedure: RIGHT REVERSE SHOULDER ARTHROPLASTY;  Surgeon: Cammy Copa, MD;  Location: [facility] OR;  Service: Orthopedics;  Laterality: Right;   Social History   Occupational History  . Occupation: unemployed  Tobacco Use  . Smoking status: Current Every Day Smoker    Packs/day: 0.25    Years: 43.00    Pack years: 10.75    Types: Cigarettes  . Smokeless tobacco: Never Used  . Tobacco comment: cut down to quarter pack a day, aware of resources  Vaping Use  . Vaping Use: Never used  Substance and Sexual Activity  . Alcohol use: Yes    Alcohol/week: 2.0 standard drinks    Types: 2 Cans of beer per week    Comment: occasional  . Drug use: Yes    Frequency: 7.0 times per week    Types: Marijuana    Comment: daily - for neuropathy  . Sexual activity: Yes

## 2021-02-21 ENCOUNTER — Encounter: Payer: Self-pay | Admitting: Orthopedic Surgery

## 2021-02-27 ENCOUNTER — Other Ambulatory Visit: Payer: Self-pay

## 2021-02-27 ENCOUNTER — Ambulatory Visit (INDEPENDENT_AMBULATORY_CARE_PROVIDER_SITE_OTHER): Payer: Self-pay | Admitting: Family

## 2021-02-27 DIAGNOSIS — S98112A Complete traumatic amputation of left great toe, initial encounter: Secondary | ICD-10-CM

## 2021-02-27 DIAGNOSIS — Z89412 Acquired absence of left great toe: Secondary | ICD-10-CM

## 2021-02-27 MED ORDER — OXYCODONE-ACETAMINOPHEN 5-325 MG PO TABS: 1.0000 | ORAL_TABLET | Freq: Three times a day (TID) | ORAL | 0 refills | Status: DC | PRN

## 2021-03-02 ENCOUNTER — Telehealth: Payer: Self-pay | Admitting: Pharmacy Technician

## 2021-03-02 NOTE — Telephone Encounter (Signed)
Patient failed to provide 2022 proof of income.  No additional medication assistance will be provided by [facility] without the required proof of income documentation.  Patient notified by letter.  Sherilyn Dacosta Care Manager [facility]   [address] [place], [place]  [address]    February 24, 2021    Justin Landry [address] [place], [place]  [address]  Dear Justin Landry:  This is to inform you that you are no longer eligible to receive medication assistance at [facility].  The reason(s) are:    _____Your total gross monthly household income exceeds 250% of the Federal Poverty Level.   _____Tangible assets (savings, checking, stocks/bonds, pension, retirement, etc.) exceeds our limit  _____You are eligible to receive benefits from [place], [facility] or HIV Medication              Assistance Program _____You are eligible to receive benefits from a Medicare Part "D" plan _____You have prescription insurance  _____You are not an [facility] resident __X__Failure to provide all requested proof of income information for 2022.    Medication assistance will resume once all requested financial information has been returned to our clinic.  If you have questions, please contact our clinic at [phone].    Thank you,  [facility]

## 2021-03-03 ENCOUNTER — Ambulatory Visit (INDEPENDENT_AMBULATORY_CARE_PROVIDER_SITE_OTHER): Payer: Self-pay | Admitting: Family

## 2021-03-03 ENCOUNTER — Encounter: Payer: Self-pay | Admitting: Family

## 2021-03-03 ENCOUNTER — Encounter: Payer: Self-pay | Admitting: Orthopedic Surgery

## 2021-03-03 ENCOUNTER — Telehealth: Payer: Self-pay | Admitting: Orthopedic Surgery

## 2021-03-03 ENCOUNTER — Ambulatory Visit: Payer: Self-pay | Admitting: Physician Assistant

## 2021-03-03 ENCOUNTER — Ambulatory Visit: Payer: Self-pay | Admitting: Family

## 2021-03-03 DIAGNOSIS — S98112A Complete traumatic amputation of left great toe, initial encounter: Secondary | ICD-10-CM

## 2021-03-03 DIAGNOSIS — Z89412 Acquired absence of left great toe: Secondary | ICD-10-CM

## 2021-03-03 NOTE — Telephone Encounter (Signed)
Pt is s/p a 1st and 2nd ray amputation 01/21/2021 last refill of Oxycodone 02/27/21 #30 pt is asking for refill please advise.

## 2021-03-03 NOTE — Telephone Encounter (Signed)
Too soon for refill.

## 2021-03-03 NOTE — Progress Notes (Signed)
Office Visit Note   Patient: Justin Landry           Date of Birth: [birth year]           MRN: [id number] Visit Date: 02/17/2021              Requested by: Rolm Gala, NP [address] [place],  [place] [address] PCP: Rolm Gala, NP  Chief Complaint  Patient presents with  . Left Foot - Routine Post Op    01/21/21 left foot 1st and 2nd ray amputation       HPI: Patient is a 60 year old gentleman who presents 4 weeks status post left foot first and second ray amputation.  Patient is currently wearing the toes of a knee-high sock he cut the top off.  He is currently on doxycycline is full weightbearing in a postoperative shoe he is using Trental and nitroglycerin patch to help with healing patient states that his leg feels better dependent.  Assessment & Plan: Visit Diagnoses:  1. 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  Follow-Up Instructions: Return in about 2 weeks (around 03/03/2021).   Ortho Exam  Patient is alert, oriented, no adenopathy, well-dressed, normal affect, normal respiratory effort. Examination the wound is dry there is no cellulitis no drainage sutures are harvested the periwound tissue is improving with wearing the sock.  Imaging: No results found. No images are attached to the encounter.  Labs: Lab Results  Component Value Date   HGBA1C 7.5 (H) 09/24/2020   HGBA1C 7.2 (H) 09/10/2020   HGBA1C 7.6 (H) 06/25/2020   REPTSTATUS 09/11/2020 FINAL 09/10/2020   GRAMSTAIN  06/23/2019    FEW WBC PRESENT, PREDOMINANTLY PMN RARE GRAM POSITIVE COCCI    CULT (A) 09/10/2020    <10,000 COLONIES/mL INSIGNIFICANT GROWTH Performed at [facility], [address] [place], [place] [address]    LABORGA LECLERCIA ADECARBOXYLATA 06/23/2019   LABORGA STAPHYLOCOCCUS CAPRAE 06/23/2019   LABORGA STAPHYLOCOCCUS SIMULANS 06/23/2019     Lab Results   Component Value Date   ALBUMIN 4.2 09/10/2020   ALBUMIN 4.5 07/14/2020   ALBUMIN 4.5 06/25/2020    No results found for: MG No results found for: VD25OH  No results found for: PREALBUMIN CBC EXTENDED Latest Ref Rng & Units 01/21/2021 12/10/2020 09/10/2020  WBC 4.0 - 10.5 K/uL 12.0(H) 15.5(H) 12.1(H)  RBC 4.22 - 5.81 MIL/uL 4.94 4.83 5.22  HGB 13.0 - 17.0 g/dL 25.9 56.3 87.5  HCT 64.3 - 52.0 % 44.8 43.7 47.8  PLT 150 - 400 K/uL 499(H) 367 303  NEUTROABS 1.7 - 7.7 K/uL - - -  LYMPHSABS 0.7 - 4.0 K/uL - - -     There is no height or weight on file to calculate BMI.  Orders:  No orders of the defined types were placed in this encounter.  No orders of the defined types were placed in this encounter.    Procedures: No procedures performed  Clinical Data: No additional findings.  ROS:  All other systems negative, except as noted in the HPI. Review of Systems  Objective: Vital Signs: There were no vitals taken for this visit.  Specialty Comments:  No specialty comments available.  PMFS History: Patient Active Problem List   Diagnosis Date Noted  . Subacute osteomyelitis of left foot (HCC)   . Rupture of operation wound   . Osteomyelitis  of great toe of left foot (HCC)   . Overgrown toenails 10/30/2020  . Pre-operative clearance 09/16/2020  . Abnormal EKG 09/16/2020  . Chronic left shoulder pain 04/10/2020  . Toe pain 02/21/2020  . Anxiety 02/08/2020  . Left knee pain 02/07/2020  . Inguinodynia, right 11/13/2019  . Elevated lipids 10/04/2019  . 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 History of right inguinal hernia 08/14/2019  . Encounter to establish care 08/14/2019  . Anxiety 08/14/2019  . Right shoulder pain 08/14/2019  . Cellulitis 06/22/2019  . Essential hypertension 05/27/2015  . Type 2 diabetes mellitus without complication (HCC) 05/27/2015  . Diverticulosis of large  intestine without hemorrhage 10/16/2014  . Recurrent right inguinal hernia 09/27/2014  . Tobacco use 08/01/2014  . Hematuria, gross 07/03/2014  . Lower urinary tract symptoms (LUTS) 07/03/2014  . Diabetes mellitus (HCC) 06/25/2014  . Inguinal hernia, bilateral  04/10/2014   Past Medical History:  Diagnosis Date  . Arthritis    right shoulder  . Depression   . Diabetes mellitus    Type II  . Headache    migraines  . High cholesterol   . Humerus fracture    right  . Hypertension   . Inguinal hernia   . Neuromuscular disorder (HCC)    neuropathy    Family History  Problem Relation Age of Onset  . Diabetes Mother   . Hypertension Mother   . Diabetes Father   . Hypertension Father   . Cancer Brother        brain    Past Surgical History:  Procedure Laterality Date  . AMPUTATION Left 12/10/2020   Procedure: LEFT GREAT TOE AMPUTATION;  Surgeon: [name], MD;  Location: [facility] OR;  Service: Orthopedics;  Laterality: Left;  . AMPUTATION Left 01/21/2021   Procedure: 1ST AND 2ND RAY AMPUTATION LEFT FOOT;  Surgeon: [name], MD;  Location: [facility] OR;  Service: Orthopedics;  Laterality: Left;  . AMPUTATION TOE Left 06/23/2019   Procedure: AMPUTATION TOE LEFT AMPUTATION;  Surgeon: Linus Galas, DPM;  Location: [facility] ORS;  Service: Podiatry;  Laterality: Left;  . AMPUTATION TOE Left 08/31/2019   Procedure: AMPUTATION TOE  IPJ [id number];  Surgeon: Linus Galas, DPM;  Location: [facility] ORS;  Service: Podiatry;  Laterality: Left;  . CYSTOSCOPY  2015   Biopsy  . HERNIA REPAIR Right 2008   [facility]  . REVERSE SHOULDER ARTHROPLASTY Right 09/18/2020   Procedure: RIGHT REVERSE SHOULDER ARTHROPLASTY;  Surgeon: Cammy Copa, MD;  Location: [facility] OR;  Service: Orthopedics;  Laterality: Right;   Social History   Occupational History  . Occupation: unemployed  Tobacco Use  . Smoking status: Current Every Day Smoker    Packs/day: 0.25    Years: 43.00    Pack years: 10.75    Types:  Cigarettes  . Smokeless tobacco: Never Used  . Tobacco comment: cut down to quarter pack a day, aware of resources  Vaping Use  . Vaping Use: Never used  Substance and Sexual Activity  . Alcohol use: Yes    Alcohol/week: 2.0 standard drinks    Types: 2 Cans of beer per week    Comment: occasional  . Drug use: Yes    Frequency: 7.0 times per week    Types: Marijuana    Comment: daily - for neuropathy  . Sexual activity: Yes

## 2021-03-03 NOTE — Telephone Encounter (Signed)
Tried to call pt and phone rings twice then states call can not be completed at this time. Will hold and try again later.

## 2021-03-03 NOTE — Progress Notes (Signed)
Post-Op Visit Note   Patient: Justin Landry           Date of Birth: 12/04/1961           MRN: [id number] Visit Date: 02/27/2021 PCP: Rolm Gala, NP  Chief Complaint:  Chief Complaint  Patient presents with  . Left Foot - Routine Post Op    01/21/21 left foot 1st and 2nd ray amputation     HPI:  HPI The patient is a 60 year old gentleman seen today status post left first and second ray amputations on February 9.  He is complaining of increased odor and pain to the sole of his foot this is associated with some redness he feels that he cannot sleep due to the pain.  He has been using a medical compression stocking with direct skin contact however he has cut this along the calf area to be looser.  He is currently taking Bactrim Denies fevers or chills.  Ortho Exam On examination of the ray amputation site sutures are in place there is some gaping about 4 mm in length.  This does not probe today there is eschar over 50% of the wound there is no active drainage there is some mild surrounding erythema no warmth no fluctuance no sign of abscess.  There is exquisite tenderness with palpitation to light touch for the first half of the exam.  When concluding the exam patient tolerating palpation with no complaint of pain  Visit Diagnoses: No diagnosis found.  Plan: We will continue with Bactrim daily Dial soap cleansing and dressing changes.  Discussed strict return precautions.  Discussed with the patient concern for further limb salvage surgery the patient would like to continue with current treatment will follow up in the office in 1 week  Follow-Up Instructions: No follow-ups on file.   Imaging: No results found.  Orders:  No orders of the defined types were placed in this encounter.  Meds ordered this encounter  Medications  . oxyCODONE-acetaminophen (PERCOCET) 5-325 MG tablet    Sig: Take 1 tablet by mouth every 8 (eight) hours as needed.    Dispense:  30  tablet    Refill:  0     PMFS History: Patient Active Problem List   Diagnosis Date Noted  . Subacute osteomyelitis of left foot (HCC)   . Rupture of operation wound   . Osteomyelitis of great toe of left foot (HCC)   . Overgrown toenails 10/30/2020  . Pre-operative clearance 09/16/2020  . Abnormal EKG 09/16/2020  . Chronic left shoulder pain 04/10/2020  . Toe pain 02/21/2020  . Anxiety 02/08/2020  . Left knee pain 02/07/2020  . Inguinodynia, right 11/13/2019  . Elevated lipids 10/04/2019  . 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 History of right inguinal hernia 08/14/2019  . Encounter to establish care 08/14/2019  . Anxiety 08/14/2019  . Right shoulder pain 08/14/2019  . Cellulitis 06/22/2019  . Essential hypertension 05/27/2015  . Type 2 diabetes mellitus without complication (HCC) 05/27/2015  . Diverticulosis of large intestine without hemorrhage 10/16/2014  . Recurrent right inguinal hernia 09/27/2014  . Tobacco use 08/01/2014  . Hematuria, gross 07/03/2014  . Lower urinary tract symptoms (LUTS) 07/03/2014  . Diabetes mellitus (HCC) 06/25/2014  . Inguinal hernia, bilateral  04/10/2014   Past Medical History:  Diagnosis Date  . Arthritis    right shoulder  . Depression   . Diabetes mellitus  Type II  . Headache    migraines  . High cholesterol   . Humerus fracture    right  . Hypertension   . Inguinal hernia   . Neuromuscular disorder (HCC)    neuropathy    Family History  Problem Relation Age of Onset  . Diabetes Mother   . Hypertension Mother   . Diabetes Father   . Hypertension Father   . Cancer Brother        brain    Past Surgical History:  Procedure Laterality Date  . AMPUTATION Left 12/10/2020   Procedure: LEFT GREAT TOE AMPUTATION;  Surgeon: [name], MD;  [facility] OR;  Service: Orthopedics;  Laterality: Left;  . AMPUTATION Left 01/21/2021   Procedure:  1ST AND 2ND RAY AMPUTATION LEFT FOOT;  Surgeon: [name], MD;  Location: [facility] OR;  Service: Orthopedics;  Laterality: Left;  . AMPUTATION TOE Left 06/23/2019   Procedure: AMPUTATION TOE LEFT AMPUTATION;  Surgeon: Linus Galas, DPM;  Location: [facility] ORS;  Service: Podiatry;  Laterality: Left;  . AMPUTATION TOE Left 08/31/2019   Procedure: AMPUTATION TOE  IPJ [id number];  Surgeon: Linus Galas, DPM;  Location: [facility] ORS;  Service: Podiatry;  Laterality: Left;  . CYSTOSCOPY  2015   Biopsy  . HERNIA REPAIR Right 2008   [facility]  . REVERSE SHOULDER ARTHROPLASTY Right 09/18/2020   Procedure: RIGHT REVERSE SHOULDER ARTHROPLASTY;  Surgeon: Cammy Copa, MD;  Location: [facility] OR;  Service: Orthopedics;  Laterality: Right;   Social History   Occupational History  . Occupation: unemployed  Tobacco Use  . Smoking status: Current Every Day Smoker    Packs/day: 0.25    Years: 43.00    Pack years: 10.75    Types: Cigarettes  . Smokeless tobacco: Never Used  . Tobacco comment: cut down to quarter pack a day, aware of resources  Vaping Use  . Vaping Use: Never used  Substance and Sexual Activity  . Alcohol use: Yes    Alcohol/week: 2.0 standard drinks    Types: 2 Cans of beer per week    Comment: occasional  . Drug use: Yes    Frequency: 7.0 times per week    Types: Marijuana    Comment: daily - for neuropathy  . Sexual activity: Yes

## 2021-03-03 NOTE — Telephone Encounter (Signed)
Patient called. He would like oxycodone called in for him. His call back number is [phone]

## 2021-03-04 ENCOUNTER — Encounter: Payer: Self-pay | Admitting: Family

## 2021-03-04 NOTE — Telephone Encounter (Signed)
Tried pt again and message states that the call can not be completed at this time. Will hold this message and try again later.

## 2021-03-04 NOTE — Progress Notes (Signed)
Post-Op Visit Note   Patient: Justin Landry           Date of Birth: 1961/01/20           MRN: [id number] Visit Date: 03/03/2021 PCP: Rolm Gala, NP  Chief Complaint:  Chief Complaint  Patient presents with  . Right Foot - Follow-up    HPI:  HPI The patient is a 60 year old gentleman seen today status post left first and second ray amputations on February 9.  He has been using a medical compression stocking with direct skin contact however he has cut this along the calf area to be looser.  He is currently taking Bactrim.  Is pleased with improvement in his wound and his pain.  He is full weightbearing today. Denies fevers or chills.  Ortho Exam On examination of the ray amputation site sutures are in place. there is some gaping about 6 mm wide to the distal half of the incision. The proximal incision is not yet fully healed. Is 1 mm gaped.  This does not probe. there is eschar over the distal incision this was debrided with a 10 blade knife today.  There is underlying granulation and fibrinous tissue. There is no active drainage no surrounding erythema or warmth does have a palpable dorsalis pedis pulse.    Visit Diagnoses:  1. 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 Minimize weight bearing. iodosorb dressing applied today.  Follow-Up Instructions: Return in about 2 weeks (around 03/17/2021).   Imaging: No results found.  Orders:  No orders of the defined types were placed in this encounter.  No orders of the defined types were placed in this encounter.    PMFS History: Patient Active Problem List   Diagnosis Date Noted  . Subacute osteomyelitis of left foot (HCC)   . Rupture of operation wound   . Osteomyelitis of great toe of left foot (HCC)   . Overgrown toenails 10/30/2020  . Pre-operative clearance 09/16/2020  . Abnormal EKG 09/16/2020  . Chronic left  shoulder pain 04/10/2020  . Toe pain 02/21/2020  . Anxiety 02/08/2020  . Left knee pain 02/07/2020  . Inguinodynia, right 11/13/2019  . Elevated lipids 10/04/2019  . 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 History of right inguinal hernia 08/14/2019  . Encounter to establish care 08/14/2019  . Anxiety 08/14/2019  . Right shoulder pain 08/14/2019  . Cellulitis 06/22/2019  . Essential hypertension 05/27/2015  . Type 2 diabetes mellitus without complication (HCC) 05/27/2015  . Diverticulosis of large intestine without hemorrhage 10/16/2014  . Recurrent right inguinal hernia 09/27/2014  . Tobacco use 08/01/2014  . Hematuria, gross 07/03/2014  . Lower urinary tract symptoms (LUTS) 07/03/2014  . Diabetes mellitus (HCC) 06/25/2014  . Inguinal hernia, bilateral  04/10/2014   Past Medical History:  Diagnosis Date  . Arthritis    right shoulder  . Depression   . Diabetes mellitus    Type II  . Headache    migraines  . High cholesterol   . Humerus fracture    right  . Hypertension   . Inguinal hernia   . Neuromuscular disorder (HCC)    neuropathy    Family History  Problem Relation Age of Onset  . Diabetes Mother   . Hypertension Mother   . Diabetes Father   . Hypertension Father   .  Cancer Brother        brain    Past Surgical History:  Procedure Laterality Date  . AMPUTATION Left 12/10/2020   Procedure: LEFT GREAT TOE AMPUTATION;  Surgeon: [name], MD;  Location: [facility] OR;  Service: Orthopedics;  Laterality: Left;  . AMPUTATION Left 01/21/2021   Procedure: 1ST AND 2ND RAY AMPUTATION LEFT FOOT;  Surgeon: [name], MD;  Location: [facility] OR;  Service: Orthopedics;  Laterality: Left;  . AMPUTATION TOE Left 06/23/2019   Procedure: AMPUTATION TOE LEFT AMPUTATION;  Surgeon: Linus Galas, DPM;  Location: [facility] ORS;  Service: Podiatry;  Laterality: Left;  . AMPUTATION TOE Left 08/31/2019   Procedure:  AMPUTATION TOE  IPJ 24580;  Surgeon: Linus Galas, DPM;  Location: [facility] ORS;  Service: Podiatry;  Laterality: Left;  . CYSTOSCOPY  2015   Biopsy  . HERNIA REPAIR Right 2008   [facility]  . REVERSE SHOULDER ARTHROPLASTY Right 09/18/2020   Procedure: RIGHT REVERSE SHOULDER ARTHROPLASTY;  Surgeon: Cammy Copa, MD;  Location: [facility] OR;  Service: Orthopedics;  Laterality: Right;   Social History   Occupational History  . Occupation: unemployed  Tobacco Use  . Smoking status: Current Every Day Smoker    Packs/day: 0.25    Years: 43.00    Pack years: 10.75    Types: Cigarettes  . Smokeless tobacco: Never Used  . Tobacco comment: cut down to quarter pack a day, aware of resources  Vaping Use  . Vaping Use: Never used  Substance and Sexual Activity  . Alcohol use: Yes    Alcohol/week: 2.0 standard drinks    Types: 2 Cans of beer per week    Comment: occasional  . Drug use: Yes    Frequency: 7.0 times per week    Types: Marijuana    Comment: daily - for neuropathy  . Sexual activity: Yes

## 2021-03-05 ENCOUNTER — Telehealth: Payer: Self-pay

## 2021-03-05 NOTE — Telephone Encounter (Signed)
Scheduled fu appt w Lanora Manis in May. Tried calling twice but did not go through.

## 2021-03-06 ENCOUNTER — Other Ambulatory Visit: Payer: Self-pay | Admitting: Gerontology

## 2021-03-06 NOTE — Telephone Encounter (Signed)
Unsuccessful trying to reach pt.

## 2021-03-09 ENCOUNTER — Other Ambulatory Visit: Payer: Self-pay | Admitting: Physician Assistant

## 2021-03-09 ENCOUNTER — Telehealth: Payer: Self-pay | Admitting: Orthopedic Surgery

## 2021-03-09 MED ORDER — OXYCODONE-ACETAMINOPHEN 5-325 MG PO TABS: 1.0000 | ORAL_TABLET | Freq: Three times a day (TID) | ORAL | 0 refills | Status: DC | PRN

## 2021-03-09 NOTE — Telephone Encounter (Signed)
Done

## 2021-03-09 NOTE — Telephone Encounter (Signed)
Pt requesting refill on oxycodone

## 2021-03-09 NOTE — Telephone Encounter (Signed)
Request for pain medication was denied last week. Pt asking for refill now.

## 2021-03-15 ENCOUNTER — Emergency Department (HOSPITAL_COMMUNITY): Payer: Medicaid Other

## 2021-03-15 ENCOUNTER — Emergency Department (HOSPITAL_COMMUNITY)
Admission: EM | Admit: 2021-03-15 | Discharge: 2021-03-15 | Disposition: A | Discharge status: Home / Self Care | Payer: Medicaid Other | Attending: Emergency Medicine | Admitting: Emergency Medicine

## 2021-03-15 DIAGNOSIS — S92335A Nondisplaced fracture of third metatarsal bone, left foot, initial encounter for closed fracture: Secondary | ICD-10-CM

## 2021-03-15 DIAGNOSIS — X58XXXA Exposure to other specified factors, initial encounter: Secondary | ICD-10-CM | POA: Diagnosis not present

## 2021-03-15 DIAGNOSIS — Z79899 Other long term (current) drug therapy: Secondary | ICD-10-CM | POA: Insufficient documentation

## 2021-03-15 DIAGNOSIS — F17210 Nicotine dependence, cigarettes, uncomplicated: Secondary | ICD-10-CM | POA: Insufficient documentation

## 2021-03-15 DIAGNOSIS — E119 Type 2 diabetes mellitus without complications: Secondary | ICD-10-CM | POA: Insufficient documentation

## 2021-03-15 DIAGNOSIS — Z7984 Long term (current) use of oral hypoglycemic drugs: Secondary | ICD-10-CM | POA: Insufficient documentation

## 2021-03-15 DIAGNOSIS — I10 Essential (primary) hypertension: Secondary | ICD-10-CM | POA: Diagnosis not present

## 2021-03-15 DIAGNOSIS — T8149XA Infection following a procedure, other surgical site, initial encounter: Secondary | ICD-10-CM | POA: Insufficient documentation

## 2021-03-15 DIAGNOSIS — S99922A Unspecified injury of left foot, initial encounter: Secondary | ICD-10-CM | POA: Diagnosis present

## 2021-03-15 DIAGNOSIS — B999 Unspecified infectious disease: Secondary | ICD-10-CM

## 2021-03-15 DIAGNOSIS — S92332A Displaced fracture of third metatarsal bone, left foot, initial encounter for closed fracture: Secondary | ICD-10-CM | POA: Insufficient documentation

## 2021-03-15 LAB — CBC WITH DIFFERENTIAL/PLATELET
Abs Immature Granulocytes: 0.07 10*3/uL (ref 0.00–0.07)
Basophils Absolute: 0.1 10*3/uL (ref 0.0–0.1)
Basophils Relative: 1 %
Eosinophils Absolute: 0.3 10*3/uL (ref 0.0–0.5)
Eosinophils Relative: 2 %
HCT: 39.8 % (ref 39.0–52.0)
Hemoglobin: 13.8 g/dL (ref 13.0–17.0)
Immature Granulocytes: 1 %
Lymphocytes Relative: 20 %
Lymphs Abs: 2.5 10*3/uL (ref 0.7–4.0)
MCH: 30.7 pg (ref 26.0–34.0)
MCHC: 34.7 g/dL (ref 30.0–36.0)
MCV: 88.4 fL (ref 80.0–100.0)
Monocytes Absolute: 1 10*3/uL (ref 0.1–1.0)
Monocytes Relative: 8 %
Neutro Abs: 8.9 10*3/uL — ABNORMAL HIGH (ref 1.7–7.7)
Neutrophils Relative %: 68 %
Platelets: 430 10*3/uL — ABNORMAL HIGH (ref 150–400)
RBC: 4.5 MIL/uL (ref 4.22–5.81)
RDW: 13.4 % (ref 11.5–15.5)
WBC: 12.9 10*3/uL — ABNORMAL HIGH (ref 4.0–10.5)
nRBC: 0 % (ref 0.0–0.2)

## 2021-03-15 LAB — BASIC METABOLIC PANEL
Anion gap: 9 (ref 5–15)
BUN: 11 mg/dL (ref 6–20)
CO2: 26 mmol/L (ref 22–32)
Calcium: 10.1 mg/dL (ref 8.9–10.3)
Chloride: 101 mmol/L (ref 98–111)
Creatinine, Ser: 0.83 mg/dL (ref 0.61–1.24)
GFR, Estimated: >60 mL/min (ref >60)
Glucose, Bld: 179 mg/dL — ABNORMAL HIGH (ref 70–99)
Potassium: 3.9 mmol/L (ref 3.5–5.1)
Sodium: 136 mmol/L (ref 135–145)

## 2021-03-15 MED ORDER — ONDANSETRON HCL 4 MG/2ML IJ SOLN
4.0000 mg | Freq: Once | INTRAMUSCULAR | Status: AC
  Administered 2021-03-15: 4 mg via INTRAVENOUS
  Filled 2021-03-15: qty 2

## 2021-03-15 MED ORDER — FENTANYL CITRATE (PF) 100 MCG/2ML IJ SOLN
100.0000 ug | Freq: Once | INTRAMUSCULAR | Status: AC
  Administered 2021-03-15: 100 ug via INTRAVENOUS
  Filled 2021-03-15: qty 2

## 2021-03-15 MED ORDER — FENTANYL CITRATE (PF) 100 MCG/2ML IJ SOLN
50.0000 ug | Freq: Once | INTRAMUSCULAR | Status: AC
Start: 2021-03-15 — End: 2021-03-15
  Administered 2021-03-15: 50 ug via INTRAVENOUS
  Filled 2021-03-15: qty 2

## 2021-03-15 MED ORDER — HYDROXYZINE HCL 10 MG PO TABS
10.0000 mg | ORAL_TABLET | Freq: Once | ORAL | Status: DC
  Filled 2021-03-15: qty 1

## 2021-03-15 NOTE — ED Notes (Signed)
Patient transported to X-ray 

## 2021-03-15 NOTE — ED Provider Notes (Signed)
[facility] EMERGENCY DEPARTMENT Provider Note   CSN: [id number] Arrival date & time: 03/15/21  1256     History Chief Complaint  Patient presents with  . Wound Infection    Pt arrived via [facility] with c/c of possible foot infection. Per pt last night he noticed some pain, discoloration, swelling, and odor. Pt stated he recently had some stitches taken out a few weeks ago froma surgery that happened months ago where he had two toes amputated.  128/84, 97%RA, 98.2, 103HR     Justin Landry is a 60 y.o. male.  Patient is a 60 year old male with a history of subacute osteomyelitis of the left foot status post amputation of the left great toe 3 or 4 months ago, diabetes, hypertension who is presenting today with worsening wound pain and drainage.  Patient reports that he has been having ongoing drainage since the surgery and the wound had opened up but in the last 2 days he started to have significant pain that is not controlled by the Percocet.  He has not noticed any fevers and has not been checking his sugar but is unknown if his sugar has been running high.  He does report that he had a appointment to see Dr. Sharol Given next week but cannot deal with the pain.  He is not currently on any antibiotics.  He is not receiving wound care at this time.  The history is provided by the patient.       Past Medical History:  Diagnosis Date  . Arthritis    right shoulder  . Depression   . Diabetes mellitus    Type II  . Headache    migraines  . High cholesterol   . Humerus fracture    right  . Hypertension   . Inguinal hernia   . Neuromuscular disorder [facility])    neuropathy    Patient Active Problem List   Diagnosis Date Noted  . Subacute osteomyelitis of left foot ([place])   . Rupture of operation wound   . Osteomyelitis of great toe of left foot ([place])   . Overgrown toenails 10/30/2020  . Pre-operative clearance 09/16/2020  . Abnormal EKG 09/16/2020  . Chronic  left shoulder pain 04/10/2020  . Toe pain 02/21/2020  . Anxiety 02/08/2020  . Left knee pain 02/07/2020  . Inguinodynia, right 11/13/2019  . Elevated lipids 10/04/2019  . Diabetic ulcer of toe of left foot associated with diabetes mellitus due to underlying condition ([place]) 08/23/2019  . History of right inguinal hernia 08/14/2019  . Encounter to establish care 08/14/2019  . Anxiety 08/14/2019  . Right shoulder pain 08/14/2019  . Cellulitis 06/22/2019  . Essential hypertension 05/27/2015  . Type 2 diabetes mellitus without complication ([place]) 60/63/0160  . Diverticulosis of large intestine without hemorrhage 10/16/2014  . Recurrent right inguinal hernia 09/27/2014  . Tobacco use 08/01/2014  . Hematuria, gross 07/03/2014  . Lower urinary tract symptoms (LUTS) 07/03/2014  . Diabetes mellitus ([place]) 06/25/2014  . Inguinal hernia, bilateral  04/10/2014    Past Surgical History:  Procedure Laterality Date  . AMPUTATION Left 12/10/2020   Procedure: LEFT GREAT TOE AMPUTATION;  Surgeon: Newt Minion, MD;  Location: [place];  Service: Orthopedics;  Laterality: Left;  . AMPUTATION Left 01/21/2021   Procedure: 1ST AND 2ND RAY AMPUTATION LEFT FOOT;  Surgeon: Newt Minion, MD;  Location: [place];  Service: Orthopedics;  Laterality: Left;  . AMPUTATION TOE Left 06/23/2019   Procedure: AMPUTATION TOE  LEFT AMPUTATION;  Surgeon: Sharlotte Alamo, DPM;  Location: [facility] ORS;  Service: Podiatry;  Laterality: Left;  . AMPUTATION TOE Left 08/31/2019   Procedure: AMPUTATION TOE  IPJ [id number];  Surgeon: Sharlotte Alamo, DPM;  Location: [facility] ORS;  Service: Podiatry;  Laterality: Left;  . CYSTOSCOPY  2015   Biopsy  . HERNIA REPAIR Right 2008   [facility]  . REVERSE SHOULDER ARTHROPLASTY Right 09/18/2020   Procedure: RIGHT REVERSE SHOULDER ARTHROPLASTY;  Surgeon: Meredith Pel, MD;  Location: [place];  Service: Orthopedics;  Laterality: Right;       Family History  Problem Relation Age of Onset  . Diabetes Mother    . Hypertension Mother   . Diabetes Father   . Hypertension Father   . Cancer Brother        brain    Social History   Tobacco Use  . Smoking status: Current Every Day Smoker    Packs/day: 0.25    Years: 43.00    Pack years: 10.75    Types: Cigarettes  . Smokeless tobacco: Never Used  . Tobacco comment: cut down to quarter pack a day, aware of resources  Vaping Use  . Vaping Use: Never used  Substance Use Topics  . Alcohol use: Yes    Alcohol/week: 2.0 standard drinks    Types: 2 Cans of beer per week    Comment: occasional  . Drug use: Yes    Frequency: 7.0 times per week    Types: Marijuana    Comment: daily - for neuropathy    Home Medications Prior to Admission medications   Medication Sig Start Date End Date Taking? Authorizing Provider  amLODipine (NORVASC) 10 MG tablet Take 1 tablet (10 mg total) by mouth daily. 08/07/20   Iloabachie, Chioma E, NP  amLODipine (NORVASC) 10 MG tablet TAKE ONE TABLET BY MOUTH EVERY DAY 03/06/21 03/06/22  Iloabachie, Chioma E, NP  ascorbic acid (VITAMIN C) 500 MG tablet Take 500 mg by mouth daily.     [provider]  blood glucose meter kit and supplies KIT Dispense based on patient and insurance preference. Use up to four times daily as directed. (FOR ICD-9 250.00, 250.01). 04/10/20   Iloabachie, Chioma E, NP  budesonide-formoterol (SYMBICORT) 160-4.5 MCG/ACT inhaler Inhale 2 puffs into the lungs 2 (two) times daily as needed (respiratory issues.).    [provider]  celecoxib (CELEBREX) 200 MG capsule Take 1 capsule (200 mg total) by mouth 2 (two) times daily. 02/13/21   Newt Minion, MD  doxycycline (ADOXA) 100 MG tablet Take 1 tablet (100 mg total) by mouth 2 (two) times daily. 02/10/21   Persons, Bevely Palmer, PA  Fish Oil-Cholecalciferol (FISH OIL + D3) 1000-1000 MG-UNIT CAPS Take 1 capsule by mouth daily with breakfast.     [provider]  gabapentin (NEURONTIN) 400 MG capsule Take 1 capsule (400 mg total) by  mouth 3 (three) times daily. 09/30/20   Iloabachie, Chioma E, NP  glipiZIDE (GLUCOTROL) 10 MG tablet 1 TABLET BY MOUTH 2 TIMES A DAY WITH A MEAL 09/30/20 02/09/21  Iloabachie, Chioma E, NP  Glucosamine-Chondroitin (COSAMIN DS PO) Take 1 tablet by mouth daily.    [provider]  hydrALAZINE (APRESOLINE) 25 MG tablet TAKE ONE TABLET BY MOUTH 3 TIMES A DAY 10/30/20 01/12/21  Iloabachie, Chioma E, NP  lisinopril (ZESTRIL) 40 MG tablet Take 1 tab ( 40 mg) daily Patient taking differently: Take 40 mg by mouth daily. 09/30/20   Iloabachie, Chioma E,  NP  lisinopril (ZESTRIL) 40 MG tablet TAKE ONE TABLET BY MOUTH EVERY DAY 03/06/21 03/06/22  Iloabachie, Chioma E, NP  metFORMIN (GLUCOPHAGE) 1000 MG tablet TAKE ONE TABLET BY MOUTH 2 TIMES A DAY WITH A MEAL 01/28/21 01/28/22  Iloabachie, Chioma E, NP  methocarbamol (ROBAXIN) 500 MG tablet Take 1 tablet (500 mg total) by mouth every 8 (eight) hours as needed. 02/18/21   Magnant, Charles L, PA-C  nitroGLYCERIN (NITRODUR - DOSED IN MG/24 HR) 0.2 mg/hr patch Place 1 patch (0.2 mg total) onto the skin daily. 01/27/21   Persons, Bevely Palmer, PA  oxyCODONE-acetaminophen (PERCOCET) 5-325 MG tablet Take 1 tablet by mouth every 8 (eight) hours as needed. 03/09/21   Persons, Bevely Palmer, PA  pentoxifylline (TRENTAL) 400 MG CR tablet Take 1 tablet (400 mg total) by mouth 3 (three) times daily with meals. 01/27/21   Persons, Bevely Palmer, PA  pioglitazone (ACTOS) 15 MG tablet Take 1 tablet (15 mg total) by mouth daily. 09/30/20   Iloabachie, Chioma E, NP  rosuvastatin (CRESTOR) 5 MG tablet Take 1 tablet (5 mg total) by mouth daily at 6 PM. 09/30/20   Iloabachie, Chioma E, NP  sulfamethoxazole-trimethoprim (BACTRIM DS) 800-160 MG tablet Take 1 tablet by mouth 2 (two) times daily. 02/12/21   Newt Minion, MD    Allergies    Anacin-3 [acetaminophen], Morphine and related, Oxycontin [oxycodone hcl], Penicillins, and Tramadol  Review of Systems   Review of Systems  All other  systems reviewed and are negative.   Physical Exam Updated Vital Signs BP 137/90   Pulse 85   Temp 98.4 F (36.9 C) (Oral)   Resp 19   Ht 5' 7"  (1.702 m)   Wt 95.3 kg   SpO2 97%   BMI 32.89 kg/m   Physical Exam Vitals and nursing note reviewed.  Constitutional:      General: He is not in acute distress.    Appearance: He is well-developed.  HENT:     Head: Normocephalic and atraumatic.  Eyes:     Conjunctiva/sclera: Conjunctivae normal.     Pupils: Pupils are equal, round, and reactive to light.  Cardiovascular:     Rate and Rhythm: Normal rate and regular rhythm.     Heart sounds: No murmur heard.   Pulmonary:     Effort: Pulmonary effort is normal. No respiratory distress.     Breath sounds: Normal breath sounds. No wheezing or rales.  Abdominal:     General: There is no distension.     Palpations: Abdomen is soft.     Tenderness: There is no abdominal tenderness. There is no guarding or rebound.  Musculoskeletal:        General: Tenderness present. Normal range of motion.     Cervical back: Normal range of motion and neck supple.       Feet:  Skin:    General: Skin is warm and dry.     Findings: No erythema or rash.  Neurological:     Mental Status: He is alert and oriented to person, place, and time. Mental status is at baseline.  Psychiatric:        Mood and Affect: Mood normal.        Behavior: Behavior normal.        Thought Content: Thought content normal.     ED Results / Procedures / Treatments   Labs (all labs ordered are listed, but only abnormal results are displayed) Labs Reviewed  CBC WITH DIFFERENTIAL/PLATELET - Abnormal;  Notable for the following components:      Result Value   WBC 12.9 (*)    Platelets 430 (*)    Neutro Abs 8.9 (*)    All other components within normal limits  BASIC METABOLIC PANEL - Abnormal; Notable for the following components:   Glucose, Bld 179 (*)    All other components within normal limits     EKG None  Radiology DG Foot Complete Left  Result Date: 03/15/2021 CLINICAL DATA:  60 year old male with diabetic foot ulcer. EXAM: LEFT FOOT - COMPLETE 3+ VIEW COMPARISON:  Left foot radiograph dated 11/17/2020. FINDINGS: There is a mildly displaced, angulated, and impacted fracture of the distal aspect of the third metatarsal, acute or subacute. Status post amputation of the first ray and transmetatarsal amputation of the second ray. The bones are osteopenic. No dislocation. Postsurgical changes in the soft tissues of the medial forefoot. There is irregularity of the skin which may be postoperative or represent ulceration. Clinical correlation is recommended. There is diffuse soft tissue swelling of the foot. IMPRESSION: 1. fracture of the distal aspect of the third metatarsal, acute or subacute. 2. Postsurgical changes of first and second ray. 3. Postsurgical changes or ulceration of the skin and soft tissues of the medial foot. 4. Osteopenia. Electronically Signed   By: Anner Crete M.D.   On: 03/15/2021 15:24    Procedures Procedures   Medications Ordered in ED Medications  fentaNYL (SUBLIMAZE) injection 100 mcg (has no administration in time range)  fentaNYL (SUBLIMAZE) injection 50 mcg (50 mcg Intravenous Given 03/15/21 1402)  ondansetron (ZOFRAN) injection 4 mg (4 mg Intravenous Given 03/15/21 1359)    ED Course  I have reviewed the triage vital signs and the nursing notes.  Pertinent labs & imaging results that were available during my care of the patient were reviewed by me and considered in my medical decision making (see chart for details).    MDM Rules/Calculators/A&P                          Patient presenting today with diabetic foot wound.  It has been gradually worsening over months since he had an amputation.  Patient is not currently receiving wound care and is on no antibiotics.  He has had significant pain in the last few days not controlled by his oral pain  medications.  Patient's wound appears necrotic and concern for osteomyelitis.  He is not having significant systemic symptoms.  White count of 12.9 which is not significantly different than prior BMP with blood sugar of 179 but otherwise at baseline.  Foot x-ray with new fracture of the distal aspect of the third metatarsal with is acute or subacute.  No signs of osteomyelitis.  MDM Number of Diagnoses or Management Options   Amount and/or Complexity of Data Reviewed Clinical lab tests: ordered and reviewed Tests in the radiology section of CPT: ordered and reviewed Independent visualization of images, tracings, or specimens: yes    Final Clinical Impression(s) / ED Diagnoses Final diagnoses:  Infection    Rx / DC Orders ED Discharge Orders    None       Blanchie Dessert, MD 03/15/21 1553

## 2021-03-15 NOTE — Discharge Instructions (Signed)
Please be sure to follow-up with your orthopedist tomorrow.  Continue taking all medication as prescribed.  Return here for any concerning changes in your condition.

## 2021-03-15 NOTE — ED Triage Notes (Signed)
Pt arrived via [facility] with c/c of possible foot infection. Per pt last night he noticed some pain, discoloration, swelling, and odor. Pt stated he recently had some stitches taken out a few weeks ago froma surgery that happened months ago where he had two toes amputated.  128/84, 97%RA, 98.2, 103HR

## 2021-03-15 NOTE — ED Provider Notes (Signed)
6:52 PM Care of the patient assumed at signout. On signout we are awaiting return call from patient's orthopedic service to discuss his presentation today concerning for chronic drainage from his prior amputation site as well as evidence for new metatarsal fracture.  Now, after evaluation here the patient requests discharge prior to completing that evaluation.  Patient is alert, sitting upright, speaking clearly.  He voices understanding of the risks, states that he will follow up with his orthopedist himself tomorrow morning.   Gerhard Munch, MD 03/15/21 [phone]

## 2021-03-16 ENCOUNTER — Ambulatory Visit (INDEPENDENT_AMBULATORY_CARE_PROVIDER_SITE_OTHER): Payer: Self-pay | Admitting: Orthopedic Surgery

## 2021-03-16 ENCOUNTER — Other Ambulatory Visit: Payer: Self-pay

## 2021-03-16 ENCOUNTER — Encounter: Payer: Self-pay | Admitting: Orthopedic Surgery

## 2021-03-16 ENCOUNTER — Other Ambulatory Visit: Payer: Self-pay | Admitting: Surgical

## 2021-03-16 DIAGNOSIS — E1142 Type 2 diabetes mellitus with diabetic polyneuropathy: Secondary | ICD-10-CM

## 2021-03-16 DIAGNOSIS — T8781 Dehiscence of amputation stump: Secondary | ICD-10-CM

## 2021-03-16 MED ORDER — OXYCODONE-ACETAMINOPHEN 5-325 MG PO TABS: 1.0000 | ORAL_TABLET | Freq: Four times a day (QID) | ORAL | 0 refills | Status: DC | PRN

## 2021-03-16 NOTE — Telephone Encounter (Signed)
Prescription called in earlier.

## 2021-03-16 NOTE — Progress Notes (Signed)
Office Visit Note   Patient: Justin Landry           Date of Birth: February 07, 1961           MRN: [id number] Visit Date: 03/16/2021              Requested by: Rolm Gala, NP [address] [place],  [place] [address] PCP: Rolm Gala, NP  No chief complaint on file.     HPI: Patient is a 60 year old gentleman with diabetic peripheral vascular disease status post first and second ray amputations of the left foot for foot salvage intervention.  Patient is currently on doxycycline.  Patient states his blood sugars run in the 500s to 600s he states he is a smoker.  He complains of increasing pain drainage and odor from the surgical incision.  Assessment & Plan: Visit Diagnoses:  1. Dehiscence of amputation stump (HCC)   2. Diabetic polyneuropathy associated with type 2 diabetes mellitus (HCC)     Plan: Discussed with the patient and his best option is to proceed with a transtibial amputation.  Patient states he understands risks and benefits and wishes to proceed with surgery at this time we will plan for surgery on Wednesday refill his prescription for Percocet.  Follow-Up Instructions: Return in about 2 weeks (around 03/30/2021).   Ortho Exam  Patient is alert, oriented, no adenopathy, well-dressed, normal affect, normal respiratory effort. Examination patient has foul-smelling drainage with necrosis around the wound edges.  There is no ascending cellulitis.  The Doppler was used and patient has a strong biphasic dorsalis pedis and posterior tibial pulse.  Imaging: DG Foot Complete Left  Result Date: 03/15/2021 CLINICAL DATA:  60 year old male with diabetic foot ulcer. EXAM: LEFT FOOT - COMPLETE 3+ VIEW COMPARISON:  Left foot radiograph dated 11/17/2020. FINDINGS: There is a mildly displaced, angulated, and impacted fracture of the distal aspect of the third metatarsal, acute or subacute. Status post amputation of the first ray and transmetatarsal  amputation of the second ray. The bones are osteopenic. No dislocation. Postsurgical changes in the soft tissues of the medial forefoot. There is irregularity of the skin which may be postoperative or represent ulceration. Clinical correlation is recommended. There is diffuse soft tissue swelling of the foot. IMPRESSION: 1. fracture of the distal aspect of the third metatarsal, acute or subacute. 2. Postsurgical changes of first and second ray. 3. Postsurgical changes or ulceration of the skin and soft tissues of the medial foot. 4. Osteopenia. Electronically Signed   By: Elgie Collard M.D.   On: 03/15/2021 15:24   No images are attached to the encounter.  Labs: Lab Results  Component Value Date   HGBA1C 7.5 (H) 09/24/2020   HGBA1C 7.2 (H) 09/10/2020   HGBA1C 7.6 (H) 06/25/2020   REPTSTATUS 09/11/2020 FINAL 09/10/2020   GRAMSTAIN  06/23/2019    FEW WBC PRESENT, PREDOMINANTLY PMN RARE GRAM POSITIVE COCCI    CULT (A) 09/10/2020    <10,000 COLONIES/mL INSIGNIFICANT GROWTH Performed at [facility], [address] [place], [place] [address]    LABORGA LECLERCIA ADECARBOXYLATA 06/23/2019   LABORGA STAPHYLOCOCCUS CAPRAE 06/23/2019   LABORGA STAPHYLOCOCCUS SIMULANS 06/23/2019     Lab Results  Component Value Date   ALBUMIN 4.2 09/10/2020   ALBUMIN 4.5 07/14/2020   ALBUMIN 4.5 06/25/2020    No results found for: MG No results found for: VD25OH  No results found for: PREALBUMIN CBC EXTENDED Latest Ref Rng & Units 03/15/2021 01/21/2021  12/10/2020  WBC 4.0 - 10.5 K/uL 12.9(H) 12.0(H) 15.5(H)  RBC 4.22 - 5.81 MIL/uL 4.50 4.94 4.83  HGB 13.0 - 17.0 g/dL 32.6 71.2 45.8  HCT 09.9 - 52.0 % 39.8 44.8 43.7  PLT 150 - 400 K/uL 430(H) 499(H) 367  NEUTROABS 1.7 - 7.7 K/uL 8.9(H) - -  LYMPHSABS 0.7 - 4.0 K/uL 2.5 - -     There is no height or weight on file to calculate BMI.  Orders:  No orders of the defined types were placed in this encounter.  No orders of the defined types  were placed in this encounter.    Procedures: No procedures performed  Clinical Data: No additional findings.  ROS:  All other systems negative, except as noted in the HPI. Review of Systems  Objective: Vital Signs: There were no vitals taken for this visit.  Specialty Comments:  No specialty comments available.  PMFS History: Patient Active Problem List   Diagnosis Date Noted  . Subacute osteomyelitis of left foot (HCC)   . Rupture of operation wound   . Osteomyelitis of great toe of left foot (HCC)   . Overgrown toenails 10/30/2020  . Pre-operative clearance 09/16/2020  . Abnormal EKG 09/16/2020  . Chronic left shoulder pain 04/10/2020  . Toe pain 02/21/2020  . Anxiety 02/08/2020  . Left knee pain 02/07/2020  . Inguinodynia, right 11/13/2019  . Elevated lipids 10/04/2019  . 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 History of right inguinal hernia 08/14/2019  . Encounter to establish care 08/14/2019  . Anxiety 08/14/2019  . Right shoulder pain 08/14/2019  . Cellulitis 06/22/2019  . Essential hypertension 05/27/2015  . Type 2 diabetes mellitus without complication (HCC) 05/27/2015  . Diverticulosis of large intestine without hemorrhage 10/16/2014  . Recurrent right inguinal hernia 09/27/2014  . Tobacco use 08/01/2014  . Hematuria, gross 07/03/2014  . Lower urinary tract symptoms (LUTS) 07/03/2014  . Diabetes mellitus (HCC) 06/25/2014  . Inguinal hernia, bilateral  04/10/2014   Past Medical History:  Diagnosis Date  . Arthritis    right shoulder  . Depression   . Diabetes mellitus    Type II  . Headache    migraines  . High cholesterol   . Humerus fracture    right  . Hypertension   . Inguinal hernia   . Neuromuscular disorder (HCC)    neuropathy    Family History  Problem Relation Age of Onset  . Diabetes Mother   . Hypertension Mother   . Diabetes Father   .  Hypertension Father   . Cancer Brother        brain    Past Surgical History:  Procedure Laterality Date  . AMPUTATION Left 12/10/2020   Procedure: LEFT GREAT TOE AMPUTATION;  Surgeon: [name], MD;  Location: [facility] OR;  Service: Orthopedics;  Laterality: Left;  . AMPUTATION Left 01/21/2021   Procedure: 1ST AND 2ND RAY AMPUTATION LEFT FOOT;  Surgeon: [name], MD;  Location: [facility] OR;  Service: Orthopedics;  Laterality: Left;  . AMPUTATION TOE Left 06/23/2019   Procedure: AMPUTATION TOE LEFT AMPUTATION;  Surgeon: Linus Galas, DPM;  Location: [facility] ORS;  Service: Podiatry;  Laterality: Left;  . AMPUTATION TOE Left 08/31/2019   Procedure: AMPUTATION TOE  IPJ [id number];  Surgeon: Linus Galas, DPM;  Location: [facility] ORS;  Service: Podiatry;  Laterality: Left;  . CYSTOSCOPY  2015   Biopsy  . HERNIA REPAIR  Right 2008   [facility]  . REVERSE SHOULDER ARTHROPLASTY Right 09/18/2020   Procedure: RIGHT REVERSE SHOULDER ARTHROPLASTY;  Surgeon: Cammy Copa, MD;  Location: [facility] OR;  Service: Orthopedics;  Laterality: Right;   Social History   Occupational History  . Occupation: unemployed  Tobacco Use  . Smoking status: Current Every Day Smoker    Packs/day: 0.25    Years: 43.00    Pack years: 10.75    Types: Cigarettes  . Smokeless tobacco: Never Used  . Tobacco comment: cut down to quarter pack a day, aware of resources  Vaping Use  . Vaping Use: Never used  Substance and Sexual Activity  . Alcohol use: Yes    Alcohol/week: 2.0 standard drinks    Types: 2 Cans of beer per week    Comment: occasional  . Drug use: Yes    Frequency: 7.0 times per week    Types: Marijuana    Comment: daily - for neuropathy  . Sexual activity: Yes

## 2021-03-17 ENCOUNTER — Other Ambulatory Visit: Payer: Self-pay

## 2021-03-17 ENCOUNTER — Encounter (HOSPITAL_COMMUNITY): Payer: Self-pay | Admitting: Orthopedic Surgery

## 2021-03-17 ENCOUNTER — Ambulatory Visit: Payer: Self-pay | Admitting: Physician Assistant

## 2021-03-17 ENCOUNTER — Other Ambulatory Visit: Payer: Self-pay | Admitting: Physician Assistant

## 2021-03-17 NOTE — Progress Notes (Signed)
Patient denies shortness of breath, fever, cough or chest pain.  PCP - Eulogio Bear, NP Cardiologist - n/a  Chest x-ray - n/a EKG - 09/10/20 Stress Test - n/a ECHO - n/a Cardiac Cath - n/a  Fasting Blood Sugar - unknown Checks Blood Sugar  Does not check blood sugar  . Do not take glipizide, actos or metformin the morning of surgery.  . THE NIGHT BEFORE SURGERY, do not take glipizide Tues night dose.      ERAS: Clear liquids til 9 am day of surgery.  Anesthesia review: Yes - Dr Pincus Large now taking any Aspirin (unless otherwise instructed by your surgeon), Aleve, Naproxen, Ibuprofen, Motrin, Advil, Goody's, BC's, all herbal medications, fish oil, and all vitamins.   Coronavirus Screening Covid test on DOS 03/18/21 Do you have any of the following symptoms:  Cough yes/no: No Fever (>100.33F)  yes/no: No Runny nose yes/no: No Sore throat yes/no: No Difficulty breathing/shortness of breath  yes/no: No  Have you traveled in the last 14 days and where? yes/no: No  Patient verbalized understanding of instructions that were given via phone.

## 2021-03-18 ENCOUNTER — Inpatient Hospital Stay (HOSPITAL_COMMUNITY)
Admission: RE | Admit: 2021-03-18 | Discharge: 2021-03-24 | DRG: 475 | Disposition: A | Discharge status: Home Health | Payer: Medicaid Other | Attending: Orthopedic Surgery | Admitting: Orthopedic Surgery

## 2021-03-18 ENCOUNTER — Encounter (HOSPITAL_COMMUNITY): Payer: Self-pay | Admitting: Orthopedic Surgery

## 2021-03-18 ENCOUNTER — Inpatient Hospital Stay (HOSPITAL_COMMUNITY): Payer: Medicaid Other | Admitting: Anesthesiology

## 2021-03-18 ENCOUNTER — Encounter (HOSPITAL_COMMUNITY)
Admission: RE | Disposition: A | Discharge status: Home Health | Payer: Self-pay | Source: Home / Self Care | Attending: Orthopedic Surgery

## 2021-03-18 ENCOUNTER — Other Ambulatory Visit: Payer: Self-pay

## 2021-03-18 DIAGNOSIS — Z885 Allergy status to narcotic agent status: Secondary | ICD-10-CM

## 2021-03-18 DIAGNOSIS — Z89422 Acquired absence of other left toe(s): Secondary | ICD-10-CM

## 2021-03-18 DIAGNOSIS — Z880 Allergy status to penicillin: Secondary | ICD-10-CM

## 2021-03-18 DIAGNOSIS — E1152 Type 2 diabetes mellitus with diabetic peripheral angiopathy with gangrene: Secondary | ICD-10-CM | POA: Diagnosis present

## 2021-03-18 DIAGNOSIS — E669 Obesity, unspecified: Secondary | ICD-10-CM | POA: Diagnosis present

## 2021-03-18 DIAGNOSIS — E1169 Type 2 diabetes mellitus with other specified complication: Secondary | ICD-10-CM | POA: Diagnosis present

## 2021-03-18 DIAGNOSIS — Z833 Family history of diabetes mellitus: Secondary | ICD-10-CM

## 2021-03-18 DIAGNOSIS — Z8249 Family history of ischemic heart disease and other diseases of the circulatory system: Secondary | ICD-10-CM | POA: Diagnosis not present

## 2021-03-18 DIAGNOSIS — M1990 Unspecified osteoarthritis, unspecified site: Secondary | ICD-10-CM | POA: Diagnosis present

## 2021-03-18 DIAGNOSIS — Z20822 Contact with and (suspected) exposure to covid-19: Secondary | ICD-10-CM | POA: Diagnosis present

## 2021-03-18 DIAGNOSIS — Z96611 Presence of right artificial shoulder joint: Secondary | ICD-10-CM | POA: Diagnosis present

## 2021-03-18 DIAGNOSIS — E785 Hyperlipidemia, unspecified: Secondary | ICD-10-CM | POA: Diagnosis present

## 2021-03-18 DIAGNOSIS — T8781 Dehiscence of amputation stump: Secondary | ICD-10-CM | POA: Diagnosis present

## 2021-03-18 DIAGNOSIS — L02612 Cutaneous abscess of left foot: Secondary | ICD-10-CM

## 2021-03-18 DIAGNOSIS — F17210 Nicotine dependence, cigarettes, uncomplicated: Secondary | ICD-10-CM | POA: Diagnosis present

## 2021-03-18 DIAGNOSIS — Z89412 Acquired absence of left great toe: Secondary | ICD-10-CM | POA: Diagnosis not present

## 2021-03-18 DIAGNOSIS — M86172 Other acute osteomyelitis, left ankle and foot: Secondary | ICD-10-CM | POA: Diagnosis present

## 2021-03-18 DIAGNOSIS — I10 Essential (primary) hypertension: Secondary | ICD-10-CM | POA: Diagnosis present

## 2021-03-18 DIAGNOSIS — E1142 Type 2 diabetes mellitus with diabetic polyneuropathy: Secondary | ICD-10-CM | POA: Diagnosis present

## 2021-03-18 DIAGNOSIS — Z6832 Body mass index (BMI) 32.0-32.9, adult: Secondary | ICD-10-CM | POA: Diagnosis not present

## 2021-03-18 DIAGNOSIS — Y835 Amputation of limb(s) as the cause of abnormal reaction of the patient, or of later complication, without mention of misadventure at the time of the procedure: Secondary | ICD-10-CM | POA: Diagnosis present

## 2021-03-18 DIAGNOSIS — M86272 Subacute osteomyelitis, left ankle and foot: Secondary | ICD-10-CM

## 2021-03-18 DIAGNOSIS — Z886 Allergy status to analgesic agent status: Secondary | ICD-10-CM | POA: Diagnosis not present

## 2021-03-18 DIAGNOSIS — F32A Depression, unspecified: Secondary | ICD-10-CM | POA: Diagnosis present

## 2021-03-18 DIAGNOSIS — Z8739 Personal history of other diseases of the musculoskeletal system and connective tissue: Secondary | ICD-10-CM | POA: Insufficient documentation

## 2021-03-18 HISTORY — PX: AMPUTATION: SHX166

## 2021-03-18 LAB — GLUCOSE, CAPILLARY
Glucose-Capillary: 212 mg/dL — ABNORMAL HIGH (ref 70–99)
Glucose-Capillary: 179 mg/dL — ABNORMAL HIGH (ref 70–99)
Glucose-Capillary: 153 mg/dL — ABNORMAL HIGH (ref 70–99)
Glucose-Capillary: 247 mg/dL — ABNORMAL HIGH (ref 70–99)

## 2021-03-18 LAB — HEMOGLOBIN A1C
Hgb A1c MFr Bld: 7.5 % — ABNORMAL HIGH (ref 4.8–5.6)
Mean Plasma Glucose: 168.55 mg/dL

## 2021-03-18 LAB — SARS CORONAVIRUS 2 BY RT PCR (HOSPITAL ORDER, PERFORMED IN ~~LOC~~ HOSPITAL LAB): SARS Coronavirus 2: NEGATIVE

## 2021-03-18 SURGERY — AMPUTATION BELOW KNEE
Anesthesia: General | Site: Knee | Laterality: Left

## 2021-03-18 MED ORDER — MIDAZOLAM HCL 2 MG/2ML IJ SOLN: 2.0000 mg | Freq: Once | INTRAMUSCULAR | Status: AC

## 2021-03-18 MED ORDER — CEFAZOLIN SODIUM-DEXTROSE 2-4 GM/100ML-% IV SOLN
2.0000 g | Freq: Three times a day (TID) | INTRAVENOUS | Status: AC
  Administered 2021-03-18 – 2021-03-19 (×2): 2 g via INTRAVENOUS
  Filled 2021-03-18 (×3): qty 100

## 2021-03-18 MED ORDER — LISINOPRIL 20 MG PO TABS
40.0000 mg | ORAL_TABLET | Freq: Every day | ORAL | Status: DC
  Administered 2021-03-19 – 2021-03-23 (×5): 40 mg via ORAL
  Filled 2021-03-18 (×5): qty 2

## 2021-03-18 MED ORDER — PROPOFOL 10 MG/ML IV BOLUS
INTRAVENOUS | Status: DC | PRN
  Administered 2021-03-18: 150 mg via INTRAVENOUS
  Administered 2021-03-18 (×2): 20 mg via INTRAVENOUS

## 2021-03-18 MED ORDER — HYDROMORPHONE HCL 1 MG/ML IJ SOLN
INTRAMUSCULAR | Status: AC
  Administered 2021-03-18: 0.5 mg via INTRAVENOUS
  Filled 2021-03-18: qty 1

## 2021-03-18 MED ORDER — PHENOL 1.4 % MT LIQD: 1.0000 | OROMUCOSAL | Status: DC | PRN

## 2021-03-18 MED ORDER — MAGNESIUM SULFATE 2 GM/50ML IV SOLN
2.0000 g | Freq: Every day | INTRAVENOUS | Status: DC | PRN
  Filled 2021-03-18: qty 50

## 2021-03-18 MED ORDER — NICOTINE 21 MG/24HR TD PT24
21.0000 mg | MEDICATED_PATCH | Freq: Every day | TRANSDERMAL | Status: DC
  Administered 2021-03-18 – 2021-03-23 (×6): 21 mg via TRANSDERMAL
  Filled 2021-03-18 (×6): qty 1

## 2021-03-18 MED ORDER — ONDANSETRON HCL 4 MG/2ML IJ SOLN
INTRAMUSCULAR | Status: DC | PRN
  Administered 2021-03-18: 4 mg via INTRAVENOUS

## 2021-03-18 MED ORDER — CHLORHEXIDINE GLUCONATE 0.12 % MT SOLN
15.0000 mL | Freq: Once | OROMUCOSAL | Status: AC
  Administered 2021-03-18: 15 mL via OROMUCOSAL
  Filled 2021-03-18: qty 15

## 2021-03-18 MED ORDER — FENTANYL CITRATE (PF) 100 MCG/2ML IJ SOLN
INTRAMUSCULAR | Status: AC
  Administered 2021-03-18: 50 ug via INTRAVENOUS
  Filled 2021-03-18: qty 2

## 2021-03-18 MED ORDER — GLUCERNA SHAKE PO LIQD
237.0000 mL | Freq: Two times a day (BID) | ORAL | Status: DC
  Administered 2021-03-20 – 2021-03-23 (×8): 237 mL via ORAL

## 2021-03-18 MED ORDER — MOMETASONE FURO-FORMOTEROL FUM 200-5 MCG/ACT IN AERO
2.0000 | INHALATION_SPRAY | Freq: Two times a day (BID) | RESPIRATORY_TRACT | Status: DC
  Administered 2021-03-19 – 2021-03-23 (×10): 2 via RESPIRATORY_TRACT
  Filled 2021-03-18: qty 8.8

## 2021-03-18 MED ORDER — POTASSIUM CHLORIDE CRYS ER 20 MEQ PO TBCR: 20.0000 meq | EXTENDED_RELEASE_TABLET | Freq: Every day | ORAL | Status: DC | PRN

## 2021-03-18 MED ORDER — LACTATED RINGERS IV SOLN: INTRAVENOUS | Status: DC

## 2021-03-18 MED ORDER — ACETAMINOPHEN 325 MG PO TABS
325.0000 mg | ORAL_TABLET | Freq: Four times a day (QID) | ORAL | Status: DC | PRN
Start: 2021-03-19 — End: 2021-03-24

## 2021-03-18 MED ORDER — ROPIVACAINE HCL 7.5 MG/ML IJ SOLN
INTRAMUSCULAR | Status: DC | PRN
  Administered 2021-03-18: 20 mL via PERINEURAL

## 2021-03-18 MED ORDER — GABAPENTIN 400 MG PO CAPS
400.0000 mg | ORAL_CAPSULE | Freq: Three times a day (TID) | ORAL | Status: DC
  Administered 2021-03-18 – 2021-03-23 (×16): 400 mg via ORAL
  Filled 2021-03-18 (×16): qty 1

## 2021-03-18 MED ORDER — METFORMIN HCL 500 MG PO TABS
1000.0000 mg | ORAL_TABLET | Freq: Two times a day (BID) | ORAL | Status: DC
  Administered 2021-03-19 – 2021-03-23 (×10): 1000 mg via ORAL
  Filled 2021-03-18 (×10): qty 2

## 2021-03-18 MED ORDER — TRANEXAMIC ACID-NACL 1000-0.7 MG/100ML-% IV SOLN
1000.0000 mg | Freq: Once | INTRAVENOUS | Status: AC
  Administered 2021-03-18: 1000 mg via INTRAVENOUS
  Filled 2021-03-18: qty 100

## 2021-03-18 MED ORDER — PHENYLEPHRINE 40 MCG/ML (10ML) SYRINGE FOR IV PUSH (FOR BLOOD PRESSURE SUPPORT)
PREFILLED_SYRINGE | INTRAVENOUS | Status: DC | PRN
  Administered 2021-03-18 (×4): 80 ug via INTRAVENOUS

## 2021-03-18 MED ORDER — FENTANYL CITRATE (PF) 100 MCG/2ML IJ SOLN: 50.0000 ug | Freq: Once | INTRAMUSCULAR | Status: AC

## 2021-03-18 MED ORDER — LABETALOL HCL 5 MG/ML IV SOLN: 10.0000 mg | INTRAVENOUS | Status: DC | PRN

## 2021-03-18 MED ORDER — ONDANSETRON HCL 4 MG/2ML IJ SOLN
4.0000 mg | Freq: Four times a day (QID) | INTRAMUSCULAR | Status: DC | PRN
  Administered 2021-03-20: 4 mg via INTRAVENOUS
  Filled 2021-03-18: qty 2

## 2021-03-18 MED ORDER — SODIUM CHLORIDE 0.9 % IR SOLN
Status: DC | PRN
  Administered 2021-03-18: 1000 mL

## 2021-03-18 MED ORDER — PNEUMOCOCCAL VAC POLYVALENT 25 MCG/0.5ML IJ INJ
0.5000 mL | INJECTION | INTRAMUSCULAR | Status: DC
  Filled 2021-03-18: qty 0.5

## 2021-03-18 MED ORDER — SODIUM CHLORIDE 0.9 % IV SOLN: INTRAVENOUS | Status: DC

## 2021-03-18 MED ORDER — DOCUSATE SODIUM 100 MG PO CAPS
100.0000 mg | ORAL_CAPSULE | Freq: Every day | ORAL | Status: DC
  Administered 2021-03-19 – 2021-03-23 (×5): 100 mg via ORAL
  Filled 2021-03-18 (×5): qty 1

## 2021-03-18 MED ORDER — HYDROMORPHONE HCL 1 MG/ML IJ SOLN
0.5000 mg | INTRAMUSCULAR | Status: DC | PRN
  Administered 2021-03-18 – 2021-03-22 (×9): 1 mg via INTRAVENOUS
  Filled 2021-03-18 (×9): qty 1

## 2021-03-18 MED ORDER — VITAMIN D (ERGOCALCIFEROL) 1.25 MG (50000 UNIT) PO CAPS
50000.0000 [IU] | ORAL_CAPSULE | Freq: Once | ORAL | Status: AC
  Administered 2021-03-18: 50000 [IU] via ORAL
  Filled 2021-03-18: qty 1

## 2021-03-18 MED ORDER — HYDROMORPHONE HCL 1 MG/ML IJ SOLN: 0.2500 mg | INTRAMUSCULAR | Status: DC | PRN

## 2021-03-18 MED ORDER — ALUM & MAG HYDROXIDE-SIMETH 200-200-20 MG/5ML PO SUSP: 15.0000 mL | ORAL | Status: DC | PRN

## 2021-03-18 MED ORDER — PANTOPRAZOLE SODIUM 40 MG PO TBEC
40.0000 mg | DELAYED_RELEASE_TABLET | Freq: Every day | ORAL | Status: DC
  Administered 2021-03-18 – 2021-03-23 (×6): 40 mg via ORAL
  Filled 2021-03-18 (×6): qty 1

## 2021-03-18 MED ORDER — ASCORBIC ACID 500 MG PO TABS
500.0000 mg | ORAL_TABLET | Freq: Every day | ORAL | Status: DC
  Administered 2021-03-19 – 2021-03-23 (×5): 500 mg via ORAL
  Filled 2021-03-18 (×5): qty 1

## 2021-03-18 MED ORDER — GLIPIZIDE 10 MG PO TABS
10.0000 mg | ORAL_TABLET | Freq: Two times a day (BID) | ORAL | Status: DC
  Administered 2021-03-19 – 2021-03-23 (×10): 10 mg via ORAL
  Filled 2021-03-18 (×3): qty 1
  Filled 2021-03-18 (×2): qty 2
  Filled 2021-03-18: qty 1
  Filled 2021-03-18: qty 2
  Filled 2021-03-18 (×3): qty 1
  Filled 2021-03-18: qty 2
  Filled 2021-03-18: qty 1
  Filled 2021-03-18: qty 2
  Filled 2021-03-18: qty 1
  Filled 2021-03-18 (×3): qty 2
  Filled 2021-03-18: qty 1
  Filled 2021-03-18: qty 2
  Filled 2021-03-18: qty 1

## 2021-03-18 MED ORDER — METOPROLOL TARTRATE 5 MG/5ML IV SOLN: 2.0000 mg | INTRAVENOUS | Status: DC | PRN

## 2021-03-18 MED ORDER — AMLODIPINE BESYLATE 10 MG PO TABS
10.0000 mg | ORAL_TABLET | Freq: Every day | ORAL | Status: DC
  Administered 2021-03-19 – 2021-03-23 (×5): 10 mg via ORAL
  Filled 2021-03-18 (×5): qty 1

## 2021-03-18 MED ORDER — ONDANSETRON HCL 4 MG/2ML IJ SOLN: 4.0000 mg | Freq: Once | INTRAMUSCULAR | Status: DC | PRN

## 2021-03-18 MED ORDER — HYDRALAZINE HCL 20 MG/ML IJ SOLN: 5.0000 mg | INTRAMUSCULAR | Status: DC | PRN

## 2021-03-18 MED ORDER — PIOGLITAZONE HCL 15 MG PO TABS: 15.0000 mg | ORAL_TABLET | Freq: Every day | ORAL | Status: DC

## 2021-03-18 MED ORDER — CEFAZOLIN SODIUM-DEXTROSE 2-4 GM/100ML-% IV SOLN
2.0000 g | INTRAVENOUS | Status: AC
  Administered 2021-03-18: 2 g via INTRAVENOUS
  Filled 2021-03-18: qty 100

## 2021-03-18 MED ORDER — ORAL CARE MOUTH RINSE: 15.0000 mL | Freq: Once | OROMUCOSAL | Status: AC

## 2021-03-18 MED ORDER — ROPIVACAINE HCL 5 MG/ML IJ SOLN
INTRAMUSCULAR | Status: DC | PRN
  Administered 2021-03-18: 30 mL via PERINEURAL

## 2021-03-18 MED ORDER — MIDAZOLAM HCL 2 MG/2ML IJ SOLN
INTRAMUSCULAR | Status: AC
  Administered 2021-03-18: 2 mg via INTRAVENOUS
  Filled 2021-03-18: qty 2

## 2021-03-18 MED ORDER — INSULIN ASPART 100 UNIT/ML ~~LOC~~ SOLN
0.0000 [IU] | Freq: Three times a day (TID) | SUBCUTANEOUS | Status: DC
  Administered 2021-03-19: 5 [IU] via SUBCUTANEOUS
  Administered 2021-03-19 – 2021-03-20 (×2): 3 [IU] via SUBCUTANEOUS
  Administered 2021-03-20: 2 [IU] via SUBCUTANEOUS
  Administered 2021-03-20: 3 [IU] via SUBCUTANEOUS
  Administered 2021-03-21 – 2021-03-22 (×3): 2 [IU] via SUBCUTANEOUS
  Administered 2021-03-22: 3 [IU] via SUBCUTANEOUS

## 2021-03-18 MED ORDER — GUAIFENESIN-DM 100-10 MG/5ML PO SYRP: 15.0000 mL | ORAL_SOLUTION | ORAL | Status: DC | PRN

## 2021-03-18 MED ORDER — METHOCARBAMOL 500 MG PO TABS
500.0000 mg | ORAL_TABLET | Freq: Three times a day (TID) | ORAL | Status: DC | PRN
  Administered 2021-03-19 – 2021-03-23 (×5): 500 mg via ORAL
  Filled 2021-03-18 (×5): qty 1

## 2021-03-18 MED ORDER — ROSUVASTATIN CALCIUM 5 MG PO TABS: 5.0000 mg | ORAL_TABLET | Freq: Every day | ORAL | Status: DC

## 2021-03-18 MED ORDER — ZINC SULFATE 220 (50 ZN) MG PO CAPS
220.0000 mg | ORAL_CAPSULE | Freq: Every day | ORAL | Status: DC
  Administered 2021-03-18 – 2021-03-23 (×6): 220 mg via ORAL
  Filled 2021-03-18 (×6): qty 1

## 2021-03-18 MED ORDER — OXYCODONE HCL 5 MG PO TABS
5.0000 mg | ORAL_TABLET | ORAL | Status: DC | PRN
  Administered 2021-03-18 – 2021-03-23 (×27): 10 mg via ORAL
  Filled 2021-03-18 (×6): qty 2
  Filled 2021-03-18: qty 1
  Filled 2021-03-18 (×20): qty 2
  Filled 2021-03-18: qty 1

## 2021-03-18 SURGICAL SUPPLY — 37 items
BLADE SAW RECIP 87.9 MT (BLADE) ×2 IMPLANT
BLADE SURG 21 STRL SS (BLADE) ×2 IMPLANT
BNDG COHESIVE 6X5 TAN STRL LF (GAUZE/BANDAGES/DRESSINGS) IMPLANT
CANISTER WOUND CARE 500ML ATS (WOUND CARE) ×2 IMPLANT
COVER SURGICAL LIGHT HANDLE (MISCELLANEOUS) ×2 IMPLANT
COVER WAND RF STERILE (DRAPES) IMPLANT
CUFF TOURN SGL QUICK 34 (TOURNIQUET CUFF) ×2
CUFF TRNQT CYL 34X4.125X (TOURNIQUET CUFF) ×1 IMPLANT
DRAPE INCISE IOBAN 66X45 STRL (DRAPES) ×2 IMPLANT
DRAPE U-SHAPE 47X51 STRL (DRAPES) ×2 IMPLANT
DRESSING PREVENA PLUS CUSTOM (GAUZE/BANDAGES/DRESSINGS) ×1 IMPLANT
DRSG PREVENA PLUS CUSTOM (GAUZE/BANDAGES/DRESSINGS) ×2
DURAPREP 26ML APPLICATOR (WOUND CARE) ×2 IMPLANT
ELECT REM PT RETURN 9FT ADLT (ELECTROSURGICAL) ×2
ELECTRODE REM PT RTRN 9FT ADLT (ELECTROSURGICAL) ×1 IMPLANT
GLOVE BIOGEL PI IND STRL 9 (GLOVE) ×1 IMPLANT
GLOVE BIOGEL PI INDICATOR 9 (GLOVE) ×1
GLOVE SURG ORTHO 9.0 STRL STRW (GLOVE) ×2 IMPLANT
GOWN STRL REUS W/ TWL XL LVL3 (GOWN DISPOSABLE) ×2 IMPLANT
GOWN STRL REUS W/TWL XL LVL3 (GOWN DISPOSABLE) ×4
KIT BASIN OR (CUSTOM PROCEDURE TRAY) ×2 IMPLANT
KIT TURNOVER KIT B (KITS) ×2 IMPLANT
MANIFOLD NEPTUNE II (INSTRUMENTS) ×2 IMPLANT
NS IRRIG 1000ML POUR BTL (IV SOLUTION) ×2 IMPLANT
PACK ORTHO EXTREMITY (CUSTOM PROCEDURE TRAY) ×2 IMPLANT
PAD ARMBOARD 7.5X6 YLW CONV (MISCELLANEOUS) ×2 IMPLANT
PREVENA RESTOR ARTHOFORM 46X30 (CANNISTER) ×2 IMPLANT
SPONGE LAP 18X18 RF (DISPOSABLE) IMPLANT
STAPLER VISISTAT 35W (STAPLE) IMPLANT
STOCKINETTE IMPERVIOUS LG (DRAPES) ×2 IMPLANT
SUT ETHILON 2 0 PSLX (SUTURE) IMPLANT
SUT SILK 2 0 (SUTURE) ×2
SUT SILK 2-0 18XBRD TIE 12 (SUTURE) ×1 IMPLANT
SUT VIC AB 1 CTX 27 (SUTURE) ×4 IMPLANT
TOWEL GREEN STERILE (TOWEL DISPOSABLE) ×2 IMPLANT
TUBE CONNECTING 12X1/4 (SUCTIONS) ×2 IMPLANT
YANKAUER SUCT BULB TIP NO VENT (SUCTIONS) ×2 IMPLANT

## 2021-03-18 NOTE — Anesthesia Postprocedure Evaluation (Signed)
Anesthesia Post Note  Patient: Justin Landry  Procedure(s) Performed: LEFT BELOW KNEE AMPUTATION (Left Knee)     Patient location during evaluation: PACU Anesthesia Type: General Level of consciousness: awake and alert and oriented Pain management: pain level controlled Vital Signs Assessment: post-procedure vital signs reviewed and stable Respiratory status: spontaneous breathing, nonlabored ventilation and respiratory function stable Cardiovascular status: blood pressure returned to baseline and stable Postop Assessment: no apparent nausea or vomiting Anesthetic complications: no   No complications documented.  Last Vitals:  Vitals:   03/18/21 1315 03/18/21 1330  BP: 123/81 109/83  Pulse: 82 79  Resp: (!) 21 20  Temp:    SpO2: 100% 99%    Last Pain:  Vitals:   03/18/21 1330  TempSrc:   PainSc: 0-No pain                 Humaira Sculley A.

## 2021-03-18 NOTE — Transfer of Care (Signed)
Immediate Anesthesia Transfer of Care Note  Patient: Justin Landry  Procedure(s) Performed: LEFT BELOW KNEE AMPUTATION (Left Knee)  Patient Location: PACU  Anesthesia Type:General and Regional  Level of Consciousness: drowsy and patient cooperative  Airway & Oxygen Therapy: Patient Spontanous Breathing and Patient connected to face mask oxygen  Post-op Assessment: Report given to RN and Post -op Vital signs reviewed and stable  Post vital signs: Reviewed and stable  Last Vitals:  Vitals Value Taken Time  BP 117/77 03/18/21 1300  Temp    Pulse 85 03/18/21 1303  Resp 21 03/18/21 1303  SpO2 100 % 03/18/21 1303  Vitals shown include unvalidated device data.  Last Pain:  Vitals:   03/18/21 1040  TempSrc:   PainSc: 0-No pain         Complications: No complications documented.

## 2021-03-18 NOTE — Progress Notes (Signed)
Orthopedic Tech Progress Note Patient Details:  Justin Landry 17-Jul-1961 [id number] Called into [facility] for BK shrinker Patient ID: Justin Landry, male   DOB: Jul 19, 1961, 60 y.o.   MRN: [id number]   Lovett Calender 03/18/2021, 6:24 PM

## 2021-03-18 NOTE — Plan of Care (Signed)
  Problem: Education: Goal: Knowledge of General Education information will improve Description: Including pain rating scale, medication(s)/side effects and non-pharmacologic comfort measures Outcome: Progressing   Problem: Nutrition: Goal: Adequate nutrition will be maintained Outcome: Progressing   Problem: Coping: Goal: Level of anxiety will decrease Outcome: Progressing   Problem: Pain Managment: Goal: General experience of comfort will improve Outcome: Progressing Note: PRN oxycodone given   Problem: Safety: Goal: Ability to remain free from injury will improve Outcome: Progressing

## 2021-03-18 NOTE — H&P (Signed)
Justin Landry is an 60 y.o. male.   Chief Complaint: Left foot osteomyelitis HPI: Patient is a 60 year old gentleman with diabetic peripheral vascular disease status post first and second ray amputations of the left foot for foot salvage intervention.  Patient is currently on doxycycline.  Patient states his blood sugars run in the 500s to 600s he states he is a smoker.  He complains of increasing pain drainage and odor from the surgical incision.  Past Medical History:  Diagnosis Date  . Arthritis    right shoulder  . Depression   . Diabetes mellitus    Type II  . Headache    migraines  . High cholesterol   . Humerus fracture    right  . Hypertension   . Inguinal hernia   . Neuromuscular disorder (HCC)    neuropathy    Past Surgical History:  Procedure Laterality Date  . AMPUTATION Left 12/10/2020   Procedure: LEFT GREAT TOE AMPUTATION;  Surgeon: [name], MD;  Location: [facility] OR;  Service: Orthopedics;  Laterality: Left;  . AMPUTATION Left 01/21/2021   Procedure: 1ST AND 2ND RAY AMPUTATION LEFT FOOT;  Surgeon: [name], MD;  Location: [facility] OR;  Service: Orthopedics;  Laterality: Left;  . AMPUTATION TOE Left 06/23/2019   Procedure: AMPUTATION TOE LEFT AMPUTATION;  Surgeon: Linus Galas, DPM;  Location: [facility] ORS;  Service: Podiatry;  Laterality: Left;  . AMPUTATION TOE Left 08/31/2019   Procedure: AMPUTATION TOE  IPJ [id number];  Surgeon: Linus Galas, DPM;  Location: [facility] ORS;  Service: Podiatry;  Laterality: Left;  . CYSTOSCOPY  2015   Biopsy  . HERNIA REPAIR Right 2008   [facility]  . REVERSE SHOULDER ARTHROPLASTY Right 09/18/2020   Procedure: RIGHT REVERSE SHOULDER ARTHROPLASTY;  Surgeon: Cammy Copa, MD;  Location: [facility] OR;  Service: Orthopedics;  Laterality: Right;    Family History  Problem Relation Age of Onset  . Diabetes Mother   . Hypertension Mother   . Diabetes Father   . Hypertension Father   . Cancer Brother        brain   Social History:   reports that he has been smoking cigarettes. He has a 10.75 pack-year smoking history. He has never used smokeless tobacco. He reports previous alcohol use of about 2.0 standard drinks of alcohol per week. He reports current drug use. Frequency: 7.00 times per week. Drug: Marijuana.  Allergies:  Allergies  Allergen Reactions  . Anacin-3 [Acetaminophen] Nausea And Vomiting  . Morphine And Related Nausea And Vomiting and Other (See Comments)    Extreme sweating   . Oxycontin [Oxycodone Hcl] Other (See Comments)    Extreme NAusea and vomitting follows (Patient Tolerates Percocet short acting)  . Penicillins Nausea And Vomiting    Has patient had a PCN reaction causing immediate rash, facial/tongue/throat swelling, SOB or lightheadedness with hypotension: No Has patient had a PCN reaction causing severe rash involving mucus membranes or skin necrosis: No Has patient had a PCN reaction that required hospitalization No Has patient had a PCN reaction occurring within the last 10 years: No If all of the above answers are "NO", then may proceed with Cephalosporin use.   . Tramadol Nausea Only    No medications prior to admission.    No results found for this or any previous visit (from the past 48 hour(s)). No results found.  Review of Systems  All other systems reviewed and are negative.   Height 5\' 7"  (1.702 m), weight 95.3  kg. Physical Exam  Patient is alert, oriented, no adenopathy, well-dressed, normal affect, normal respiratory effort. Examination patient has foul-smelling drainage with necrosis around the wound edges.  There is no ascending cellulitis.  The Doppler was used and patient has a strong biphasic dorsalis pedis and posterior tibial pulse.heart RRR Lungs clear Assessment/Plan 1. Dehiscence of amputation stump (HCC)   2. Diabetic polyneuropathy associated with type 2 diabetes mellitus (HCC)     Plan: Discussed with the patient and his best option is to proceed with a  transtibial amputation.  Patient states he understands risks and benefits and wishes to proceed with surgery at this time we will plan for surgery on Wednesday refill his prescription for Percocet.   [place] Devanny Palecek, PA 03/18/2021, 6:36 AM

## 2021-03-18 NOTE — Op Note (Signed)
   Date of Surgery: 03/18/2021  INDICATIONS: Mr. Mcfarlan is a 60 y.o.-year-old male who is status post foot salvage intervention with first and second ray amputations.  Patient has had progressive wound dehiscence with abscess necrosis and osteomyelitis.  Due to failure of foot salvage intervention patient presents for transtibial amputation.Marland Kitchen  PREOPERATIVE DIAGNOSIS: Abscess osteomyelitis wound dehiscence first and second ray amputation left foot  POSTOPERATIVE DIAGNOSIS: Same.  PROCEDURE: Transtibial amputation Application of Prevena wound VAC  SURGEON: Lajoyce Corners, M.D.  ANESTHESIA:  general  IV FLUIDS AND URINE: See anesthesia records.  ESTIMATED BLOOD LOSS: See anesthesia records.  COMPLICATIONS: None.  DESCRIPTION OF PROCEDURE: The patient was brought to the operating room after undergoing regional anesthetic. After adequate levels of anesthesia were obtained patient's lower extremity was prepped using DuraPrep draped into a sterile field. A timeout was called. The foot was draped out of the sterile field with impervious stockinette. A transverse incision was made 11 cm distal to the tibial tubercle. This curved proximally and a large posterior flap was created. The tibia was transected 1 cm proximal to the skin incision. The fibula was transected just proximal to the tibial incision. The tibia was beveled anteriorly. A large posterior flap was created. The sciatic nerve was pulled cut and allowed to retract. The vascular bundles were suture ligated with 2-0 silk. The deep and superficial fascial layers were closed using #1 Vicryl. The skin was closed using staples and 2-0 nylon. The wound was covered with a Prevena customizable and arthroform wound VAC.  The dressing was sealed with dermatac there was a good suction fit. A prosthetic shrinker will be applied in patient's room. Patient was taken to the PACU in stable condition.   DISCHARGE PLANNING:  Antibiotic duration: 24  hours  Weightbearing: Nonweightbearing on the operative extremity  Pain medication: Opioid pathway  Dressing care/ Wound VAC: Continue wound VAC for 1 week after discharge  Discharge to: Discharge planning based on therapy's recommendations for possible inpatient rehabilitation, outpatient rehabilitation, or discharge to home with therapy  Follow-up: In the office 1 week post operative.  Aldean Baker, MD [facility] 1:03 PM

## 2021-03-18 NOTE — Interval H&P Note (Signed)
History and Physical Interval Note:  03/18/2021 9:38 AM  Justin Landry  has presented today for surgery, with the diagnosis of Gangrene Left Foot.  The various methods of treatment have been discussed with the patient and family. After consideration of risks, benefits and other options for treatment, the patient has consented to  Procedure(s): LEFT BELOW KNEE AMPUTATION (Left) as a surgical intervention.  The patient's history has been reviewed, patient examined, no change in status, stable for surgery.  I have reviewed the patient's chart and labs.  Questions were answered to the patient's satisfaction.     [name]

## 2021-03-18 NOTE — Anesthesia Procedure Notes (Signed)
Procedure Name: LMA Insertion Date/Time: 03/18/2021 12:22 PM Performed by: Marny Lowenstein, CRNA Pre-anesthesia Checklist: Patient identified, Emergency Drugs available, Suction available, Patient being monitored and Timeout performed Patient Re-evaluated:Patient Re-evaluated prior to induction Oxygen Delivery Method: Circle system utilized Preoxygenation: Pre-oxygenation with 100% oxygen Induction Type: IV induction Ventilation: Mask ventilation without difficulty LMA: LMA inserted LMA Size: 5.0 Number of attempts: 1 Placement Confirmation: positive ETCO2,  breath sounds checked- equal and bilateral and CO2 detector Tube secured with: Tape Dental Injury: Teeth and Oropharynx as per pre-operative assessment

## 2021-03-18 NOTE — Anesthesia Preprocedure Evaluation (Signed)
Anesthesia Evaluation  Patient identified by MRN, date of birth, ID band Patient awake    Reviewed: Allergy & Precautions, NPO status , Patient's Chart, lab work & pertinent test results, reviewed documented beta blocker date and time   Airway Mallampati: I  TM Distance: >3 FB Neck ROM: Full    Dental  (+) Missing, Poor Dentition, Dental Advisory Given,    Pulmonary Current Smoker and Patient abstained from smoking.,    Pulmonary exam normal breath sounds clear to auscultation       Cardiovascular hypertension, Pt. on medications + Peripheral Vascular Disease  Normal cardiovascular exam Rhythm:Regular Rate:Normal  EKG 08/31/20 NSR, RAD, RVH   Neuro/Psych  Headaches, PSYCHIATRIC DISORDERS Anxiety Depression Peripheral neuropathy  Neuromuscular disease    GI/Hepatic negative GI ROS, Neg liver ROS,   Endo/Other  diabetes, Poorly Controlled, Type 2, Oral Hypoglycemic AgentsObesity Hyperlipidemia  Renal/GU   negative genitourinary   Musculoskeletal  (+) Arthritis , Osteoarthritis,  Gangrene left foot   Abdominal (+) + obese,   Peds  Hematology   Anesthesia Other Findings   Reproductive/Obstetrics                             Anesthesia Physical Anesthesia Plan  ASA: III  Anesthesia Plan: General   Post-op Pain Management:  Regional for Post-op pain   Induction: Intravenous  PONV Risk Score and Plan: 2 and Treatment may vary due to age or medical condition, Midazolam and Ondansetron  Airway Management Planned: LMA  Additional Equipment:   Intra-op Plan:   Post-operative Plan: Extubation in OR  Informed Consent: I have reviewed the patients History and Physical, chart, labs and discussed the procedure including the risks, benefits and alternatives for the proposed anesthesia with the patient or authorized representative who has indicated his/her understanding and acceptance.      Dental advisory given  Plan Discussed with: CRNA and Anesthesiologist  Anesthesia Plan Comments:         Anesthesia Quick Evaluation

## 2021-03-19 ENCOUNTER — Encounter (HOSPITAL_COMMUNITY): Payer: Self-pay | Admitting: Orthopedic Surgery

## 2021-03-19 LAB — BASIC METABOLIC PANEL
Anion gap: 12 (ref 5–15)
BUN: 9 mg/dL (ref 6–20)
CO2: 23 mmol/L (ref 22–32)
Calcium: 9 mg/dL (ref 8.9–10.3)
Chloride: 97 mmol/L — ABNORMAL LOW (ref 98–111)
Creatinine, Ser: 0.79 mg/dL (ref 0.61–1.24)
GFR, Estimated: >60 mL/min (ref >60)
Glucose, Bld: 216 mg/dL — ABNORMAL HIGH (ref 70–99)
Potassium: 3.9 mmol/L (ref 3.5–5.1)
Sodium: 132 mmol/L — ABNORMAL LOW (ref 135–145)

## 2021-03-19 LAB — GLUCOSE, CAPILLARY
Glucose-Capillary: 238 mg/dL — ABNORMAL HIGH (ref 70–99)
Glucose-Capillary: 118 mg/dL — ABNORMAL HIGH (ref 70–99)
Glucose-Capillary: 165 mg/dL — ABNORMAL HIGH (ref 70–99)
Glucose-Capillary: 146 mg/dL — ABNORMAL HIGH (ref 70–99)

## 2021-03-19 LAB — SURGICAL PATHOLOGY

## 2021-03-19 LAB — CBC
HCT: 37.5 % — ABNORMAL LOW (ref 39.0–52.0)
Hemoglobin: 12.7 g/dL — ABNORMAL LOW (ref 13.0–17.0)
MCH: 29.7 pg (ref 26.0–34.0)
MCHC: 33.9 g/dL (ref 30.0–36.0)
MCV: 87.8 fL (ref 80.0–100.0)
Platelets: 392 10*3/uL (ref 150–400)
RBC: 4.27 MIL/uL (ref 4.22–5.81)
RDW: 13.1 % (ref 11.5–15.5)
WBC: 14.3 10*3/uL — ABNORMAL HIGH (ref 4.0–10.5)
nRBC: 0 % (ref 0.0–0.2)

## 2021-03-19 NOTE — TOC Initial Note (Signed)
Transition of Care [facility]) - Initial/Assessment Note    Patient Details  Name: Justin Landry MRN: [id number] Date of Birth: Apr 12, 1961  Transition of Care [facility]) CM/SW Contact:    Epifanio Lesches, RN Phone Number: 03/19/2021, 1:54 PM  Clinical Narrative:      - S/p  left below-knee amputation, 4/6          NCM spoke with pt @ bedside in regard to d/c planning. Pt states he has no place of residence , however, friend Justin Landry has allowed him to stay with on and off over the past few years.States PTA independent with ADL's, no DME usage.   Pt without health insurance. States without a job, and limited income...  Financial Counselor referral made with [address] [employer] . Justin Landry , voice message left.  NCM shared PT/OT evaluation / recommendation for SNF placement. Pt will probably need LOG for SNF placement.... NCM to speak with [facility] leadership for assistance, LOG APPROVAL.  Expected Discharge Plan: Skilled Nursing Facility Barriers to Discharge: Continued Medical Work up   Patient Goals and CMS Choice        Expected Discharge Plan and Services Expected Discharge Plan: Skilled Nursing Facility   Discharge Planning Services: CM Consult   Living arrangements for the past 2 months: Apartment                                      Prior Living Arrangements/Services Living arrangements for the past 2 months: Apartment Lives with:: Other (Comment) Justin Landry, Justin Landry) Patient language and need for interpreter reviewed:: Yes Do you feel safe going back to the place where you live?: Yes      Need for Family Participation in Patient Care: Yes (Comment) Care giver support system in place?: No (comment)   Criminal Activity/Legal Involvement Pertinent to Current Situation/Hospitalization: No - Comment as needed  Activities of Daily Living Home Assistive Devices/Equipment: CBG Meter,Walker (specify type) (rolator) ADL Screening (condition at time of admission) Patient's  cognitive ability adequate to safely complete daily activities?: Yes Is the patient deaf or have difficulty hearing?: No Does the patient have difficulty seeing, even when wearing glasses/contacts?: No Does the patient have difficulty concentrating, remembering, or making decisions?: Yes Patient able to express need for assistance with ADLs?: Yes Does the patient have difficulty dressing or bathing?: No Independently performs ADLs?: Yes (appropriate for developmental age) Does the patient have difficulty walking or climbing stairs?: No Weakness of Legs: Left Weakness of Arms/Hands: None  Permission Sought/Granted   Permission granted to share information with : Yes, Verbal Permission Granted  Share Information with NAME: Linton Rump [facility])  [phone]           Emotional Assessment Appearance:: Appears stated age Attitude/Demeanor/Rapport: Gracious Affect (typically observed): Accepting Orientation: : Oriented to Self,Oriented to Place,Oriented to  Time,Oriented to Situation Alcohol / Substance Use: Tobacco Use Psych Involvement: No (comment)  Admission diagnosis:  Acute osteomyelitis of left foot [facility]) [O27.035] Patient Active Problem List   Diagnosis Date Noted  . Acute osteomyelitis of left foot (HCC) 03/18/2021  . Cutaneous abscess of left foot   . Dehiscence of amputation stump (HCC)   . Subacute osteomyelitis of left foot (HCC)   . Rupture of operation wound   . Subacute osteomyelitis, left ankle and foot (HCC)   . Overgrown toenails 10/30/2020  . Pre-operative clearance 09/16/2020  . Abnormal EKG 09/16/2020  . Chronic  left shoulder pain 04/10/2020  . Toe pain 02/21/2020  . Anxiety 02/08/2020  . Left knee pain 02/07/2020  . Inguinodynia, right 11/13/2019  . Elevated lipids 10/04/2019  . 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 History of right inguinal hernia 08/14/2019  . Encounter  to establish care 08/14/2019  . Anxiety 08/14/2019  . Right shoulder pain 08/14/2019  . Cellulitis 06/22/2019  . Essential hypertension 05/27/2015  . Type 2 diabetes mellitus without complication (HCC) 05/27/2015  . Diverticulosis of large intestine without hemorrhage 10/16/2014  . Recurrent right inguinal hernia 09/27/2014  . Tobacco use 08/01/2014  . Hematuria, gross 07/03/2014  . Lower urinary tract symptoms (LUTS) 07/03/2014  . Diabetes mellitus (HCC) 06/25/2014  . Inguinal hernia, bilateral  04/10/2014   PCP:  Rolm Gala, NP Pharmacy:   [facility] [address] [place] [address] Phone: [phone] Fax: [phone]  [facility] [place], [place] - [address] [place] [address] Phone: [phone] Fax: [phone]  [id number] - [place], [place] - [address] [place] [address] Phone: [phone] Fax: [phone]     Social Determinants of Health (SDOH) Interventions    Readmission Risk Interventions No flowsheet data found.

## 2021-03-19 NOTE — Progress Notes (Signed)
Occupational Therapy Evaluation Patient Details Name: Justin Landry MRN: [id number] DOB: [birth year] Today's Date: 03/19/2021    History of Present Illness Patient is a 60 year old gentleman with diabetic peripheral vascular disease status post first and second ray amputations of the left foot for foot salvage intervention.  Patient is currently on doxycycline.  Patient states his blood sugars run in the 500s to 600s he states he is a smoker.  He complains of increasing pain drainage and odor from the surgical incision.   Clinical Impression   Justin Landry reported living independently prior to admission, he is "mostly homeless" however has two friend who he can stay with, one is in a trailer and the other in an apartment. He was ambulating with a non-motorized "scooter" for community mobility prior to admission. Justin Landry required min A for physical assistance and safety for functional mobility. Pt BUE WFL, he reports having pins placed into his R shoulder about a year ago, however no pain or ROM limitations now. Due to pt's barriers to home discharge, and vast change in functional status, he will benefit from SNF level care at discharge. Pt also reports smoking 2-4 packs of cigarettes per day, educated on importance to quit, pt reports he plans to quit. However if he is able to determine solid discharge to home with 24/7 support he can be discharged home with home health OT. OT to follow acutely to increase independence in ADLs.     Follow Up Recommendations  SNF    Equipment Recommendations  None recommended by OT       Precautions / Restrictions Precautions Precautions: None      Mobility Bed Mobility Overal bed mobility: Modified Independent             General bed mobility comments: Increased time for pain    Transfers Overall transfer level: Needs assistance Equipment used: Rolling walker (2 wheeled) Transfers: Stand Pivot Transfers   Stand pivot transfers:  Min assist            Balance Overall balance assessment: Needs assistance Sitting-balance support: Single extremity supported Sitting balance-Leahy Scale: Good     Standing balance support: Bilateral upper extremity supported Standing balance-Leahy Scale: Poor Standing balance comment: RW           ADL either performed or assessed with clinical judgement   ADL Overall ADL's : Needs assistance/impaired Eating/Feeding: Set up;Sitting   Grooming: Wash/dry hands;Wash/dry face;Oral care;Brushing hair;Applying deodorant;Set up;Sitting   Upper Body Bathing: Set up;With adaptive equipment;Sitting   Lower Body Bathing: Minimal assistance;With adaptive equipment;Cueing for safety;Sit to/from stand   Upper Body Dressing : Set up;Sitting   Lower Body Dressing: Minimal assistance;With adaptive equipment;Cueing for safety;Cueing for compensatory techniques;Sit to/from stand   Toilet Transfer: Minimal assistance;Cueing for safety;Stand-pivot;RW   Toileting- Clothing Manipulation and Hygiene: Minimal assistance;Cueing for safety;Sit to/from stand       Functional mobility during ADLs:  (Pt required min A for functional mobility this session using RW) General ADL Comments: Pt is able to complete UB ADLs while sitting from bed level, given RW and min A for safety pt able to complete LB ADLs and functional mobility. Pt will require education on AE/DME and compensatroy techniques as well as residual limb care education.                  Pertinent Vitals/Pain Pain Assessment: 0-10 Pain Score: 10-Worst pain ever Pain Location: L leg Pain Descriptors / Indicators: Constant;Discomfort;Guarding Pain Intervention(s): Limited activity within patient's  tolerance;Monitored during session;Patient requesting pain meds-RN notified     Hand Dominance     Extremity/Trunk Assessment Upper Extremity Assessment Upper Extremity Assessment: Overall WFL for tasks assessed;RUE  deficits/detail RUE Deficits / Details: Previous R shoulder sx RUE:  (pt with full ROM, pt reports doctor told him to limit lifting to 10 pounds with R shoulder)   Lower Extremity Assessment Lower Extremity Assessment: Defer to PT evaluation;LLE deficits/detail LLE Deficits / Details: L BKA       Communication Communication Communication: No difficulties   Cognition Arousal/Alertness: Awake/alert Behavior During Therapy: WFL for tasks assessed/performed Overall Cognitive Status: Within Functional Limits for tasks assessed                 General Comments  Pt with wound vac, brace and wrapping on LLE. IV placement in RUE            Home Living Family/patient expects to be discharged to:: Other (Comment) Living Arrangements: Non-relatives/Friends             Additional Comments: Pt reports being "mostly homeless" he reported that he stays with friends sometimes, one lives in a trailer and the other has an apartment.      Prior Functioning/Environment Level of Independence: Independent with assistive device(s) (Uses "scooter" nonmotorized for mobility)                 OT Problem List: Decreased strength;Decreased range of motion;Decreased activity tolerance;Impaired balance (sitting and/or standing);Decreased safety awareness;Decreased knowledge of use of DME or AE;Decreased knowledge of precautions;Pain      OT Treatment/Interventions: Therapeutic exercise;Self-care/ADL training;DME and/or AE instruction;Therapeutic activities;Patient/family education;Balance training    OT Goals(Current goals can be found in the care plan section) Acute Rehab OT Goals Patient Stated Goal: To get healthier to heal OT Goal Formulation: With patient Time For Goal Achievement: 04/02/21 Potential to Achieve Goals: Good ADL Goals Pt Will Perform Lower Body Bathing: with modified independence;with adaptive equipment;sit to/from stand Pt Will Perform Lower Body Dressing: with  modified independence;with adaptive equipment;sit to/from stand Pt Will Transfer to Toilet: with modified independence;stand pivot transfer;bedside commode Pt Will Perform Toileting - Clothing Manipulation and hygiene: with modified independence;sit to/from stand Additional ADL Goal #1: Pt will independently verbalize proper residual limb care to ensure safe trasnition to next level of care.  OT Frequency: Min 2X/week   Barriers to D/C: Other (comment) (Pt is homeless, does not have 24/7 support availble)          Co-evaluation PT/OT/SLP Co-Evaluation/Treatment: Yes Reason for Co-Treatment: To address functional/ADL transfers OT goals addressed during session: ADL's and self-care;Proper use of Adaptive equipment and DME      AM-PAC OT "6 Clicks" Daily Activity     Outcome Measure Help from another person eating meals?: A Little Help from another person taking care of personal grooming?: A Little Help from another person toileting, which includes using toliet, bedpan, or urinal?: A Lot Help from another person bathing (including washing, rinsing, drying)?: A Lot Help from another person to put on and taking off regular upper body clothing?: A Little Help from another person to put on and taking off regular lower body clothing?: A Little 6 Click Score: 16   End of Session Equipment Utilized During Treatment: Gait belt;Rolling walker Nurse Communication:  (pain 10/10, mobility status)  Activity Tolerance: Patient tolerated treatment well Patient left: in chair;with call bell/phone within reach;with chair alarm set  OT Visit Diagnosis: Unsteadiness on feet (R26.81);Other abnormalities of gait and mobility (  R26.89);Muscle weakness (generalized) (M62.81);Pain Pain - Right/Left: Left Pain - part of body: Leg                Time: 1050-1115 OT Time Calculation (min): 25 min Charges:  OT General Charges $OT Visit: 1 Visit OT Evaluation $OT Eval Low Complexity: 1 Low OT  Treatments $Self Care/Home Management : 8-22 mins   Merik Mignano A Shamonique Battiste 03/19/2021, 11:53 AM

## 2021-03-19 NOTE — Progress Notes (Signed)
Inpatient Rehabilitation Admissions Coordinator    Inpatient rehab consult received. Noted patient essentially homeless, SNF is recommended by therapy. If he has assist and a home to d/c to, they recommend [facility]. CIR is not appropriate venue option. I will alert acute team and TOC. We will sign off at this time.  Ottie Glazier, RN, MSN Rehab Admissions Coordinator [phone] 03/19/2021 12:34 PM

## 2021-03-19 NOTE — Progress Notes (Signed)
0000 Patient's wound vac beeping. The monitor says that there is a leak. Vyctoria Dickman December, charge nurse, and myself reinforced the dressing with supplies in room and the beeping stopped. Wound vac therapy restarted.  0045 Patient's wound vac starts beeping again and the monitor again says that there is a leak, notified by Leyna Vanderkolk December. She advised me to talk to whoever is on call for Dr. Lajoyce Corners and to see what they would like me to do regarding the wound vac.  0110 [facility] answering service notified for PA on call to call me back regarding patient's wound vac.   0145 Received call but missed the call due to tending to another patient.  0203 Called number back, it was Dr. Roda Shutters. I was not aware that he would be the one the answering service would notify. I did explain the situation to Dr. Roda Shutters since I already had him on the phone. He said that I could either turn the wound vac off or take the whole wound vac dressing down and apply a dry dressing on top of the incision. I told him that I would just turn the wound vac therapy off and to let them take a look at it in the morning.   0210 Wound vac therapy stopped. Will continue to monitor.

## 2021-03-19 NOTE — Progress Notes (Signed)
Patient ID: Justin Landry, male   DOB: May 11, 1961, 60 y.o.   MRN: [id number] Patient is postoperative day 1 left below-knee amputation.  The wound VAC was turned off secondary to leaking last night.  I reinforced the wound VAC and it has a good suction fit.  We will continue with wound VAC therapy.  Anticipate physical therapy with discharge to either inpatient or outpatient rehab.  There is no drainage in the wound VAC canister.

## 2021-03-19 NOTE — Evaluation (Signed)
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  Patient is currently on doxycycline.  Patient states his blood sugars run in the 500s to 600s he states he is a smoker.  He complains of increasing pain drainage and odor from the surgical incision.  Clinical Impression  Pt participated in evaluation. Pt states he was I prior to admission. Pt is currently homeless but states he can probably live in a trailer or a motel with a friend. Pt states he has some sort of a scooter you sit on and steer, unclear of equipment. Pt states he will have no assist with transportation. Pt given increased education on importance of maintaining clean incision and importance of positioning to maintain ROM for prosthetic. Pt with increased concern regarding d/c plan due to being homeless. Pt demonstrates deficits in balance, strength, coordination, gait, endurance, safety and DME management. Pt will benefit from skilled PT to address deficits prior to discharge     Follow Up Recommendations SNF    Equipment Recommendations  Other (comment) (TBD next venue)    Recommendations for Other Services       Precautions / Restrictions Precautions Precautions: Fall Precaution Comments: wound vac Required Braces or Orthoses: Other Brace Other Brace: limb protector Restrictions Weight Bearing Restrictions: Yes LLE Weight Bearing: Non weight bearing      Mobility  Bed Mobility Overal bed mobility: Needs Assistance Bed Mobility: Supine to Sit     Supine to sit: HOB elevated;Supervision     General bed mobility comments: line management and safety for environmental set up    Transfers Overall transfer level: Needs assistance Equipment  used: Rolling walker (2 wheeled) Transfers: Stand Pivot Transfers   Stand pivot transfers: Min assist       General transfer comment: able to perform step hop and manage movement of RW with min A  Ambulation/Gait                Stairs            Wheelchair Mobility    Modified Rankin (Stroke Patients Only)       Balance Overall balance assessment: Needs assistance Sitting-balance support: Feet unsupported;No upper extremity supported Sitting balance-Leahy Scale: Good     Standing balance support: Bilateral upper extremity supported Standing balance-Leahy Scale: Poor Standing balance comment: RW                             Pertinent Vitals/Pain Pain Assessment: 0-10 Pain Score: 10-Worst pain ever Pain Location: L leg Pain Descriptors / Indicators: Constant;Discomfort;Guarding Pain Intervention(s): Limited activity within patient's tolerance;Monitored during session;Patient requesting pain meds-RN notified    Home Living Family/patient expects to be discharged to:: Other (Comment) Living Arrangements: Non-relatives/Friends               Additional Comments: Pt reports being "mostly homeless" he reported that he stays with friends sometimes, one lives in a trailer and the other has an apartment.    Prior Function Level of Independence: Independent with assistive device(s) (Uses "scooter" nonmotorized for mobility)               Hand Dominance        Extremity/Trunk Assessment   Upper Extremity  Assessment Upper Extremity Assessment: Defer to OT evaluation RUE Deficits / Details: Previous R shoulder sx RUE:  (pt with full ROM, pt reports doctor told him to limit lifting to 10 pounds with R shoulder)    Lower Extremity Assessment Lower Extremity Assessment: Generalized weakness LLE Deficits / Details: L BKA       Communication   Communication: No difficulties  Cognition Arousal/Alertness: Awake/alert Behavior During  Therapy: WFL for tasks assessed/performed Overall Cognitive Status: Within Functional Limits for tasks assessed                                        General Comments General comments (skin integrity, edema, etc.): increased time spent educating pt on positioning (avoiding knee flexion in resting), desensitization, limb management with NWB, prosthetic process and need for rehab, pt verbalized understanding    Exercises     Assessment/Plan    PT Assessment Patient needs continued PT services  PT Problem List Decreased strength;Decreased mobility;Decreased coordination;Decreased activity tolerance;Decreased knowledge of use of DME       PT Treatment Interventions Therapeutic exercise;Gait training;Balance training;Functional mobility training;Therapeutic activities;Patient/family education    PT Goals (Current goals can be found in the Care Plan section)  Acute Rehab PT Goals Patient Stated Goal: To get healthier PT Goal Formulation: With patient Time For Goal Achievement: 04/02/21 Potential to Achieve Goals: Good    Frequency Min 3X/week   Barriers to discharge        Co-evaluation PT/OT/SLP Co-Evaluation/Treatment: Yes Reason for Co-Treatment: To address functional/ADL transfers PT goals addressed during session: Mobility/safety with mobility;Balance;Proper use of DME OT goals addressed during session: ADL's and self-care;Proper use of Adaptive equipment and DME       AM-PAC PT "6 Clicks" Mobility  Outcome Measure Help needed turning from your back to your side while in a flat bed without using bedrails?: None Help needed moving from lying on your back to sitting on the side of a flat bed without using bedrails?: A Little Help needed moving to and from a bed to a chair (including a wheelchair)?: A Little Help needed standing up from a chair using your arms (e.g., wheelchair or bedside chair)?: A Little Help needed to walk in hospital room?: A Lot Help  needed climbing 3-5 steps with a railing? : A Lot 6 Click Score: 17    End of Session Equipment Utilized During Treatment: Gait belt Activity Tolerance: Patient tolerated treatment well;Patient limited by pain Patient left: in chair;with call bell/phone within reach;with chair alarm set Nurse Communication: Mobility status PT Visit Diagnosis: Unsteadiness on feet (R26.81);Other abnormalities of gait and mobility (R26.89);Muscle weakness (generalized) (M62.81);Difficulty in walking, not elsewhere classified (R26.2)    Time: 1610-9604 PT Time Calculation (min) (ACUTE ONLY): 24 min   Charges:   PT Evaluation $PT Eval Low Complexity: 1 Low PT Treatments $Therapeutic Activity: 8-22 mins        Ginette Otto, DPT Acute Rehabilitation Services [phone]  Lucretia Field 03/19/2021, 12:06 PM

## 2021-03-19 NOTE — Plan of Care (Signed)
See progress notes

## 2021-03-20 LAB — BASIC METABOLIC PANEL
Anion gap: 11 (ref 5–15)
BUN: 8 mg/dL (ref 6–20)
CO2: 24 mmol/L (ref 22–32)
Calcium: 9.4 mg/dL (ref 8.9–10.3)
Chloride: 98 mmol/L (ref 98–111)
Creatinine, Ser: 0.64 mg/dL (ref 0.61–1.24)
GFR, Estimated: >60 mL/min (ref >60)
Glucose, Bld: 156 mg/dL — ABNORMAL HIGH (ref 70–99)
Potassium: 3.8 mmol/L (ref 3.5–5.1)
Sodium: 133 mmol/L — ABNORMAL LOW (ref 135–145)

## 2021-03-20 LAB — CBC
HCT: 38.9 % — ABNORMAL LOW (ref 39.0–52.0)
Hemoglobin: 13.3 g/dL (ref 13.0–17.0)
MCH: 29.2 pg (ref 26.0–34.0)
MCHC: 34.2 g/dL (ref 30.0–36.0)
MCV: 85.3 fL (ref 80.0–100.0)
Platelets: 395 10*3/uL (ref 150–400)
RBC: 4.56 MIL/uL (ref 4.22–5.81)
RDW: 12.7 % (ref 11.5–15.5)
WBC: 12.1 10*3/uL — ABNORMAL HIGH (ref 4.0–10.5)
nRBC: 0 % (ref 0.0–0.2)

## 2021-03-20 LAB — GLUCOSE, CAPILLARY
Glucose-Capillary: 173 mg/dL — ABNORMAL HIGH (ref 70–99)
Glucose-Capillary: 121 mg/dL — ABNORMAL HIGH (ref 70–99)
Glucose-Capillary: 152 mg/dL — ABNORMAL HIGH (ref 70–99)
Glucose-Capillary: 132 mg/dL — ABNORMAL HIGH (ref 70–99)

## 2021-03-20 MED ORDER — ZINC SULFATE 220 (50 ZN) MG PO CAPS: 220.0000 mg | ORAL_CAPSULE | Freq: Every day | ORAL | Status: DC

## 2021-03-20 MED ORDER — OXYCODONE HCL 5 MG PO TABS: 5.0000 mg | ORAL_TABLET | ORAL | 0 refills | Status: DC | PRN

## 2021-03-20 MED ORDER — GLUCERNA SHAKE PO LIQD: 237.0000 mL | Freq: Two times a day (BID) | ORAL | 0 refills | Status: DC

## 2021-03-20 NOTE — Progress Notes (Addendum)
PT Cancellation Note  Patient Details Name: Justin Landry MRN: [id number] DOB: 09-25-1961   Cancelled Treatment:    Reason Eval/Treat Not Completed: Pain limiting ability to participate. Will attempt later pending time   2nd attempt 11:30 pt refused due to nausea  Ginette Otto, DPT Acute Rehabilitation Services [phone]   Lucretia Field 03/20/2021, 9:17 AM   Doree Fudge, PT DPT  12:06 03/20/21

## 2021-03-20 NOTE — TOC Progression Note (Addendum)
Transition of Care [facility]) - Progression Note    Patient Details  Name: Justin Landry MRN: [id number] Date of Birth: 06/29/1961  [facility] CM/SW Contact  Epifanio Lesches, RN Phone Number: 03/20/2021, 11:40 AM  Clinical Narrative:     - S/p  left below-knee amputation, 4/6 , pt without insurance  NCM spoke with [facility] leadership regarding LOG for SNF placement and was denied,  NCM shared with pt. Pt states he can transition to friend's Olegario Messier [phone]) home : [address]; [place], [place]. NCM confirmed d/c plan with Olegario Messier. Orpha Bur stated she will do all she can to help and assist with his care once d/c. NCM made referral with [facility] for home health PT services, charity care...acceptance pending.  TOC team monitoring and will assist with TOC needs ...  03/20/2021 @ 1535 NCM made aware by [facility]/Kenzie approval received for home health charity services (RN,PT,SW).  Expected Discharge Plan: Home w Home Health Services Barriers to Discharge: Continued Medical Work up  Expected Discharge Plan and Services Expected Discharge Plan: Home w Home Health Services   Discharge Planning Services: CM Consult   Living arrangements for the past 2 months: Apartment      Social Determinants of Health (SDOH) Interventions    Readmission Risk Interventions No flowsheet data found.

## 2021-03-20 NOTE — Progress Notes (Signed)
Inpatient Diabetes Program Recommendations  AACE/ADA: New Consensus Statement on Inpatient Glycemic Control (2015)  Target Ranges:  Prepandial:   less than 140 mg/dL      Peak postprandial:   less than 180 mg/dL (1-2 hours)      Critically ill patients:  140 - 180 mg/dL   Lab Results  Component Value Date   GLUCAP 121 (H) 03/20/2021   HGBA1C 7.5 (H) 03/18/2021    Review of Glycemic Control Results for ADREN, DOLLINS (MRN [id number]) as of 03/20/2021 13:32  Ref. Range 03/19/2021 11:16 03/19/2021 16:35 03/19/2021 23:13 03/20/2021 06:33 03/20/2021 11:40  Glucose-Capillary Latest Ref Range: 70 - 99 mg/dL 024 (H) 097 (H) 353 (H) 173 (H) 121 (H)   Diabetes history:  DM2 Outpatient Diabetes medications:  Glipizide 10 mg BID Metformin 1000 mg BID Current orders for Inpatient glycemic control:  Novolog 0-15 units TID Metformin 1000 mg BID Glipizide 10 mg BID  Inpatient diabetes referral for diabetes control.  Agree with current regimen.  Glucose remains within hospital goal.  Will continue to follow while inpatient.  Thank you, Dulce Sellar, RN, BSN Diabetes Coordinator Inpatient Diabetes Program [phone] (team pager from 8a-5p)

## 2021-03-20 NOTE — Progress Notes (Signed)
Patient is postop day 2 status post below-knee amputation.  Has been evaluated for physical therapy needs.  Recommending skilled nursing.  Patient would like this as well.   Vital signs stable hematocrit stable wound VAC is functioning has 2 black checks but does not alarm 0 cc in the canister   Postop day 2 continue with physical therapy would pursue skilled nursing.  Will leave prescription on chart for pain medication

## 2021-03-21 LAB — GLUCOSE, CAPILLARY
Glucose-Capillary: 145 mg/dL — ABNORMAL HIGH (ref 70–99)
Glucose-Capillary: 150 mg/dL — ABNORMAL HIGH (ref 70–99)
Glucose-Capillary: 93 mg/dL (ref 70–99)
Glucose-Capillary: 93 mg/dL (ref 70–99)
Glucose-Capillary: 109 mg/dL — ABNORMAL HIGH (ref 70–99)

## 2021-03-21 NOTE — Progress Notes (Signed)
Patient ID: Justin Landry, male   DOB: [birth year], 60 y.o.   MRN: [id number] Patient is status post left below the knee amputation.  He is making slow progress with therapy I feel he will be safe to discharge to home on Monday.

## 2021-03-22 LAB — GLUCOSE, CAPILLARY
Glucose-Capillary: 130 mg/dL — ABNORMAL HIGH (ref 70–99)
Glucose-Capillary: 77 mg/dL (ref 70–99)
Glucose-Capillary: 148 mg/dL — ABNORMAL HIGH (ref 70–99)
Glucose-Capillary: 154 mg/dL — ABNORMAL HIGH (ref 70–99)
Glucose-Capillary: 119 mg/dL — ABNORMAL HIGH (ref 70–99)

## 2021-03-22 NOTE — Progress Notes (Signed)
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  Patient is currently on doxycycline.  Patient states his blood sugars run in the 500s to 600s he states he is a smoker.  He complains of increasing pain drainage and odor from the surgical incision.    PT Comments    Pt with improved pain control this session. Pt engaged in R LE exercises in addition to focusing on w/c management, propulsion, transfers and stair negotiation today. Pt will need to complete stair negotiation again tomorrow prior to planned d/c per MD note. Acute PT to cont to follow.    Follow Up Recommendations  Home health PT;Supervision/Assistance - 24 hour (has been denied SNF)     Equipment Recommendations  Wheelchair (measurements PT);Wheelchair cushion (measurements PT);Rolling walker with 5" wheels;3in1 (PT)    Recommendations for Other Services       Precautions / Restrictions Precautions Precautions: Fall Precaution Comments: wound vac Required Braces or Orthoses: Other Brace Other Brace: limb protector Restrictions Weight Bearing Restrictions: Yes LLE Weight Bearing: Non weight bearing    Mobility  Bed Mobility Overal bed mobility: Needs Assistance Bed Mobility: Supine to Sit     Supine to sit: HOB elevated;Supervision     General bed mobility comments: line management and safety for environmental set up    Transfers Overall transfer level: Needs assistance Equipment used: Rolling walker (2 wheeled) Transfers: Sit to/from [employer] Sit to Stand: Min guard   Squat pivot transfers: Min guard     General transfer comment: worked on w/c set up, management of break and arm rest for std pvt transfer  to/from bed/w/c chair, when transfering sit to stand up to RW v/c's for safe hand placement  Ambulation/Gait Ambulation/Gait assistance: Land (Feet): 10 Feet Assistive device: Rolling walker (2 wheeled) Gait Pattern/deviations: Step-to pattern Gait velocity: dec   General Gait Details: pt steady but poor endurance as pt with previous surgery on R UE limiting ability to tolerate R UE WBing for prolonged periods of time   Stairs Stairs: Yes   Stair Management: Two rails;Backwards Number of Stairs: 2 (x2) General stair comments: trialed hopping up forwards but unable, began backwards using RW for first step and then bilat hand rail for 2nd one, pt able to clear L foot, completed 2 trials   Corporate treasurer Wheelchair mobility: Yes Wheelchair propulsion: Both lower extermities;Right lower extremity Wheelchair parts: Needs assistance Distance: 150 Wheelchair Assistance Details (indicate cue type and reason): worked on propulsion, v/c's for turning and management of tight spaces, worked on part Agricultural consultant Rankin (Stroke Patients Only)       Balance Overall balance assessment: Needs assistance Sitting-balance support: Feet unsupported;No upper extremity supported Sitting balance-Leahy Scale: Good     Standing balance support: Bilateral upper extremity supported Standing balance-Leahy Scale: Poor Standing balance comment: RW                            Cognition Arousal/Alertness: Awake/alert Behavior During Therapy: WFL for tasks assessed/performed Overall Cognitive Status: Within Functional Limits for tasks assessed  Exercises Amputee Exercises Quad Sets: AROM;Right;10 reps;Supine Gluteal Sets: AROM;Both;10 reps;Supine Hip ABduction/ADduction: AROM;Left;10 reps;Supine Straight Leg Raises: AROM;Left;10 reps;Supine Other Exercises Other Exercises:  bridges using R LE    General Comments General comments (skin integrity, edema, etc.): L LE splint, wound vac, VSS      Pertinent Vitals/Pain Pain Assessment: Faces Faces Pain Scale: Hurts a little bit Pain Location: L LE Pain Descriptors / Indicators: Constant;Discomfort;Guarding Pain Intervention(s): Monitored during session    Home Living                      Prior Function            PT Goals (current goals can now be found in the care plan section) Progress towards PT goals: Progressing toward goals    Frequency    Min 3X/week      PT Plan Discharge plan needs to be updated    Co-evaluation              AM-PAC PT "6 Clicks" Mobility   Outcome Measure  Help needed turning from your back to your side while in a flat bed without using bedrails?: None Help needed moving from lying on your back to sitting on the side of a flat bed without using bedrails?: A Little Help needed moving to and from a bed to a chair (including a wheelchair)?: A Little Help needed standing up from a chair using your arms (e.g., wheelchair or bedside chair)?: A Little Help needed to walk in hospital room?: A Little Help needed climbing 3-5 steps with a railing? : A Lot 6 Click Score: 18    End of Session Equipment Utilized During Treatment: Gait belt Activity Tolerance: Patient tolerated treatment well;Patient limited by pain Patient left: in bed;with call bell/phone within reach Nurse Communication: Mobility status PT Visit Diagnosis: Unsteadiness on feet (R26.81);Other abnormalities of gait and mobility (R26.89);Muscle weakness (generalized) (M62.81);Difficulty in walking, not elsewhere classified (R26.2)     Time: 4163-8453 PT Time Calculation (min) (ACUTE ONLY): 31 min  Charges:  $Gait Training: 8-22 mins $Wheel Chair Management: 8-22 mins                     Justin Landry, PT, DPT Acute Rehabilitation Services Pager #: [phone] Office #:  [phone]    Justin Landry 03/22/2021, 11:11 AM

## 2021-03-22 NOTE — Progress Notes (Signed)
Subjective: 4 Days Post-Op Procedure(s) (LRB): LEFT BELOW KNEE AMPUTATION (Left) Patient reports pain as moderate to severe.  Tolerating diet well.   Objective: Vital signs in last 24 hours: Temp:  [98.3 F (36.8 C)-98.8 F (37.1 C)] 98.3 F (36.8 C) (04/10 0500) Pulse Rate:  [85-90] 85 (04/10 0500) Resp:  [16] 16 (04/10 0500) BP: (117-157)/(76-93) 117/90 (04/10 0500) SpO2:  [93 %-98 %] 96 % (04/10 0500)  Intake/Output from previous day: 04/09 0701 - 04/10 0700 In: -  Out: 700 [Urine:700] Intake/Output this shift: No intake/output data recorded.  Recent Labs    03/20/21 0315  HGB 13.3   Recent Labs    03/20/21 0315  WBC 12.1*  RBC 4.56  HCT 38.9*  PLT 395   Recent Labs    03/20/21 0315  NA 133*  K 3.8  CL 98  CO2 24  BUN 8  CREATININE 0.64  GLUCOSE 156*  CALCIUM 9.4   No results for input(s): LABPT, INR in the last 72 hours.  Left lower extremity: Stump shrinker in place. Wound VAC in place with empty canister Assessment/Plan: 4 Days Post-Op Procedure(s) (LRB): LEFT BELOW KNEE AMPUTATION (Left) Up with therapy Plan for discharge to home tomorrow.      Justin Landry 03/22/2021, 7:56 AM

## 2021-03-22 NOTE — Plan of Care (Signed)

## 2021-03-23 ENCOUNTER — Other Ambulatory Visit (HOSPITAL_COMMUNITY): Payer: Self-pay

## 2021-03-23 LAB — GLUCOSE, CAPILLARY
Glucose-Capillary: 96 mg/dL (ref 70–99)
Glucose-Capillary: 96 mg/dL (ref 70–99)
Glucose-Capillary: 115 mg/dL — ABNORMAL HIGH (ref 70–99)
Glucose-Capillary: 102 mg/dL — ABNORMAL HIGH (ref 70–99)

## 2021-03-23 MED ORDER — OXYCODONE HCL 10 MG PO TABS
5.0000 mg | ORAL_TABLET | ORAL | 0 refills | Status: DC | PRN
  Filled 2021-03-23: qty 30, 5d supply, fill #0

## 2021-03-23 NOTE — Progress Notes (Signed)
Patient ID: Justin Landry, male   DOB: [birth year], 60 y.o.   MRN: [id number] Patient is a 60 year old gentleman status post below the knee amputation.  Patient was making progress with therapy he states he could go up the stairs in 1 direction but not the other.  Anticipate discharge to home today after physical therapy.

## 2021-03-23 NOTE — Progress Notes (Signed)
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  Patient is currently on doxycycline.  Patient states his blood sugars run in the 500s to 600s he states he is a smoker.  He complains of increasing pain drainage and odor from the surgical incision.    PT Comments    Pt is on bed with anxiety issues, declining to walk or get OOB.  Agreed to do there ex and discussed healing process on amputation.  Pt is having more anxiety than pain, and was waiting for meds which he had not received when PT was finished.  Pt agrees to walk next visit since his plan is to get finished from rehab to go home.  Follow for acute PT goals.   Follow Up Recommendations  Home health PT;Supervision/Assistance - 24 hour     Equipment Recommendations  Wheelchair (measurements PT);Wheelchair cushion (measurements PT);Rolling walker with 5" wheels;3in1 (PT)    Recommendations for Other Services       Precautions / Restrictions Precautions Precautions: Fall Precaution Comments: wound vac Required Braces or Orthoses: Other Brace Other Brace: limb protector Restrictions Weight Bearing Restrictions: Yes LLE Weight Bearing: Non weight bearing    Mobility  Bed Mobility Overal bed mobility: Needs Assistance Bed Mobility: Supine to Sit     Supine to sit: Supervision          Transfers                 General transfer comment: declined  Ambulation/Gait             General Gait Details: declined   Social research officer, government Rankin (Stroke Patients Only)       Balance Overall balance assessment: Needs assistance Sitting-balance support: Feet supported Sitting balance-Leahy  Scale: Good                                      Cognition Arousal/Alertness: Awake/alert Behavior During Therapy: WFL for tasks assessed/performed Overall Cognitive Status: Within Functional Limits for tasks assessed                                        Exercises Amputee Exercises Quad Sets: AROM;10 reps Gluteal Sets: AROM;10 reps Towel Squeeze: AROM;10 reps Hip ABduction/ADduction: AROM;10 reps Hip Flexion/Marching: AROM;10 reps Knee Flexion: AROM;10 reps Straight Leg Raises: AROM;10 reps    General Comments General comments (skin integrity, edema, etc.): pt had his RLE out of the splint, replaced and did all exercises.  Talked with pt about healing process and recovery to get a prosthesis      Pertinent Vitals/Pain Pain Assessment: Faces Faces Pain Scale: Hurts little more Pain Location: L LE Pain Descriptors / Indicators: Operative site guarding;Tightness Pain Intervention(s): Limited activity within patient's tolerance;Monitored during session;Premedicated before session;Repositioned    Home Living                      Prior Function            PT  Goals (current goals can now be found in the care plan section) Acute Rehab PT Goals Patient Stated Goal: to get home and get pain down    Frequency    Min 3X/week      PT Plan Current plan remains appropriate    Co-evaluation              AM-PAC PT "6 Clicks" Mobility   Outcome Measure  Help needed turning from your back to your side while in a flat bed without using bedrails?: None Help needed moving from lying on your back to sitting on the side of a flat bed without using bedrails?: None Help needed moving to and from a bed to a chair (including a wheelchair)?: A Little Help needed standing up from a chair using your arms (e.g., wheelchair or bedside chair)?: A Little Help needed to walk in hospital room?: A Little Help needed climbing 3-5 steps with a  railing? : A Lot 6 Click Score: 19    End of Session   Activity Tolerance: Patient tolerated treatment well;Other (comment) (limited by anxiety) Patient left: in bed;with call bell/phone within reach Nurse Communication: Mobility status PT Visit Diagnosis: Unsteadiness on feet (R26.81);Other abnormalities of gait and mobility (R26.89);Muscle weakness (generalized) (M62.81);Difficulty in walking, not elsewhere classified (R26.2)     Time: 5621-3086 PT Time Calculation (min) (ACUTE ONLY): 27 min  Charges:  $Therapeutic Exercise: 8-22 mins $Therapeutic Activity: 8-22 mins  Ivar Drape 03/23/2021, 7:34 PM   Samul Dada, PT MS Acute Rehab Dept. Number: [facility] [phone] and [facility] [phone]

## 2021-03-23 NOTE — TOC Transition Note (Signed)
Transition of Care [facility]) - CM/SW Discharge Note   Patient Details  Name: Justin Landry MRN: [id number] Date of Birth: 01-Jul-1961  [facility] CM/SW Contact:  Epifanio Lesches, RN Phone Number: 03/23/2021, 4:27 PM   Clinical Narrative:     Patient will DC [id number] Anticipated DC date: 03/23/2021 Family notified: Olegario Messier Transport by: Sharin Mons   Per MD patient ready for DC today. RN, patient, patient's friend Olegario Messier and [facility] notified of DC. Pt without transportation to home. Ambulance transport requested for patient.Transportation papers on front of chart packet on chart.   Rx pain med filled by [facility] pharmacy and delivered to pt 's nurse to give to pt @ d/c.  Pt with post hospital follow noted on AVS.  RNCM will sign off for now as intervention is no longer needed. Please consult [place] again if new needs arise.   Final next level of care: Home w Home Health Services Barriers to Discharge: No Barriers Identified   Patient Goals and CMS Choice     Choice offered to / list presented to : Patient  Discharge Placement                       Discharge Plan and Services   Discharge Planning Services: CM Consult            DME Arranged: Wheelchair manual DME Agency: [employer] Date DME Agency Contacted: 03/23/21 Time DME Agency Contacted: 1626 Representative spoke with at DME Agency: Velna Hatchet, w/c will be delivered to pt's place of residence if charity approval received HH Arranged: PT,Social Work,RN HH Agency: [facility] Date HH Agency Contacted: 03/23/21 Time HH Agency Contacted: 1627 Representative spoke with at [facility] Agency: Pearson Grippe  Social Determinants of Health (SDOH) Interventions     Readmission Risk Interventions No flowsheet data found.

## 2021-03-24 NOTE — Discharge Summary (Signed)
Discharge Diagnoses:  Active Problems:   Subacute osteomyelitis, left ankle and foot (HCC)   Cutaneous abscess of left foot   Dehiscence of amputation stump ([place])   Acute osteomyelitis of left foot ([place])   Surgeries: Procedure(s): LEFT BELOW KNEE AMPUTATION on 03/18/2021    Consultants:   Discharged Condition: Improved  Hospital Course: Justin Landry is an 60 y.o. male who was admitted 03/18/2021 with a chief complaint of Left foot osteomyelitis, with a final diagnosis of Gangrene Left Foot.  Patient was brought to the operating room on 03/18/2021 and underwent Procedure(s): LEFT BELOW KNEE AMPUTATION.    Patient was given perioperative antibiotics:  Anti-infectives (From admission, onward)   Start     Dose/Rate Route Frequency Ordered Stop   03/18/21 1715  ceFAZolin (ANCEF) IVPB 2g/100 mL premix        2 g 200 mL/hr over 30 Minutes Intravenous Every 8 hours 03/18/21 1704 03/19/21 0302   03/18/21 0930  ceFAZolin (ANCEF) IVPB 2g/100 mL premix        2 g 200 mL/hr over 30 Minutes Intravenous On call to O.R. 03/18/21 0915 03/18/21 1230    .  Patient was given sequential compression devices, early ambulation, and aspirin for DVT prophylaxis.  Recent vital signs:  Patient Vitals for the past 24 hrs:  BP Temp Temp src Pulse Resp SpO2  03/23/21 2224 137/85 98.4 F (36.9 C) Oral 92 16 96 %  03/23/21 1550 (!) 145/89 98 F (36.7 C) Oral (!) 101 19 99 %  03/23/21 0839 -- -- -- -- -- 96 %  03/23/21 0744 130/89 97.9 F (36.6 C) Oral 90 18 97 %  .  Recent laboratory studies: No results found.  Discharge Medications:   Allergies as of 03/24/2021      Reactions   Anacin-3 [acetaminophen] Nausea And Vomiting   Morphine And Related Nausea And Vomiting, Other (See Comments)   Extreme sweating    Oxycontin [oxycodone Hcl] Other (See Comments)   Extreme NAusea and vomitting follows (Patient Tolerates Percocet short acting)   Penicillins Nausea And Vomiting   Has patient had a PCN  reaction causing immediate rash, facial/tongue/throat swelling, SOB or lightheadedness with hypotension: No Has patient had a PCN reaction causing severe rash involving mucus membranes or skin necrosis: No Has patient had a PCN reaction that required hospitalization No Has patient had a PCN reaction occurring within the last 10 years: No If all of the above answers are "NO", then may proceed with Cephalosporin use.   Tramadol Nausea Only      Medication List    STOP taking these medications   celecoxib 200 MG capsule Commonly known as: CeleBREX   doxycycline 100 MG tablet Commonly known as: ADOXA   hydrALAZINE 25 MG tablet Commonly known as: APRESOLINE   nitroGLYCERIN 0.2 mg/hr patch Commonly known as: NITRODUR - Dosed in mg/24 hr   oxyCODONE-acetaminophen 5-325 MG tablet Commonly known as: Percocet   pentoxifylline 400 MG CR tablet Commonly known as: TRENTAL   pioglitazone 15 MG tablet Commonly known as: Actos   rosuvastatin 5 MG tablet Commonly known as: CRESTOR   sulfamethoxazole-trimethoprim 800-160 MG tablet Commonly known as: BACTRIM DS     TAKE these medications   amLODipine 10 MG tablet Commonly known as: NORVASC TAKE ONE TABLET BY MOUTH EVERY DAY What changed:   how much to take  Another medication with the same name was removed. Continue taking this medication, and follow the directions you see here.  ascorbic acid 500 MG tablet Commonly known as: VITAMIN C Take 500 mg by mouth daily.   blood glucose meter kit and supplies Kit Dispense based on patient and insurance preference. Use up to four times daily as directed. (FOR ICD-9 250.00, 250.01).   budesonide-formoterol 160-4.5 MCG/ACT inhaler Commonly known as: SYMBICORT Inhale 2 puffs into the lungs 2 (two) times daily as needed (respiratory issues.).   COSAMIN DS PO Take 1 tablet by mouth daily.   feeding supplement (GLUCERNA SHAKE) Liqd Take 237 mLs by mouth 2 (two) times daily between  meals.   Fish Oil + D3 1000-1000 MG-UNIT Caps Take 1 capsule by mouth daily with breakfast.   gabapentin 400 MG capsule Commonly known as: NEURONTIN Take 1 capsule (400 mg total) by mouth 3 (three) times daily.   glipiZIDE 10 MG tablet Commonly known as: GLUCOTROL 1 TABLET BY MOUTH 2 TIMES A DAY WITH A MEAL What changed:   how much to take  how to take this  when to take this   lisinopril 40 MG tablet Commonly known as: ZESTRIL TAKE ONE TABLET BY MOUTH EVERY DAY What changed:   how much to take  Another medication with the same name was removed. Continue taking this medication, and follow the directions you see here.   metFORMIN 1000 MG tablet Commonly known as: GLUCOPHAGE TAKE ONE TABLET BY MOUTH 2 TIMES A DAY WITH A MEAL What changed: how much to take   methocarbamol 500 MG tablet Commonly known as: Robaxin Take 1 tablet (500 mg total) by mouth every 8 (eight) hours as needed. What changed: reasons to take this   Oxycodone HCl 10 MG Tabs Take 0.5-1 tablets (5-10 mg total) by mouth every 4 (four) hours as needed.   zinc sulfate 220 (50 Zn) MG capsule Take 1 capsule (220 mg total) by mouth daily.       Diagnostic Studies: DG Foot Complete Left  Result Date: 03/15/2021 CLINICAL DATA:  59-year-old male with diabetic foot ulcer. EXAM: LEFT FOOT - COMPLETE 3+ VIEW COMPARISON:  Left foot radiograph dated 11/17/2020. FINDINGS: There is a mildly displaced, angulated, and impacted fracture of the distal aspect of the third metatarsal, acute or subacute. Status post amputation of the first ray and transmetatarsal amputation of the second ray. The bones are osteopenic. No dislocation. Postsurgical changes in the soft tissues of the medial forefoot. There is irregularity of the skin which may be postoperative or represent ulceration. Clinical correlation is recommended. There is diffuse soft tissue swelling of the foot. IMPRESSION: 1. fracture of the distal aspect of the third  metatarsal, acute or subacute. 2. Postsurgical changes of first and second ray. 3. Postsurgical changes or ulceration of the skin and soft tissues of the medial foot. 4. Osteopenia. Electronically Signed   By: Arash  Radparvar M.D.   On: 03/15/2021 15:24    Patient benefited maximally from their hospital stay and there were no complications.     Disposition: Discharge disposition: 01-Home or Self Care      Discharge Instructions    Call MD / Call 911   Complete by: As directed    If you experience chest pain or shortness of breath, CALL 911 and be transported to the hospital emergency room.  If you develope a fever above 101 F, pus (white drainage) or increased drainage or redness at the wound, or calf pain, call your surgeon's office.   Constipation Prevention   Complete by: As directed    Drink plenty of   fluids.  Prune juice may be helpful.  You may use a stool softener, such as Colace (over the counter) 100 mg twice a day.  Use MiraLax (over the counter) for constipation as needed.   Diet - low sodium heart healthy   Complete by: As directed    Increase activity slowly as tolerated   Complete by: As directed    Negative Pressure Wound Therapy - Incisional   Complete by: As directed    Show patient how to attach vac. Call office if alarms      Follow-up Information    Jjesus Dingley, [place]evely Palmer, [place] In 1 week.   Specialty: Orthopedic Surgery Contact information: [place] [address] [place] Follow up.   Why: Home health services will be provided by [facility], start of care within 48 hours post discharge Contact information: [phone]               Signed: Bevely Palmer Odella Appelhans 03/24/2021, 6:28 AM

## 2021-03-25 ENCOUNTER — Telehealth: Payer: Self-pay | Admitting: Orthopedic Surgery

## 2021-03-25 ENCOUNTER — Telehealth: Payer: Self-pay | Admitting: Gerontology

## 2021-03-25 NOTE — Telephone Encounter (Signed)
I called and sw [name] to advise that transportation has been set up for the pt and that he has an appt on the schedule for tomorrow with Dr. Lajoyce Corners and that we will call to advise to turn off vac and notify of the arrangements that have been made for visit. To call with any other questions.

## 2021-03-25 NOTE — Telephone Encounter (Signed)
   Pauline Trainer DOB: 05-Jul-1961 MRN: [id number]   RIDER WAIVER AND RELEASE OF LIABILITY  For purposes of improving physical access to our facilities, [facility] is pleased to partner with third parties to provide [facility] patients or other authorized individuals the option of convenient, on-demand ground transportation services (the [employer]") through use of the technology service that enables users to request on-demand ground transportation from independent third-party providers.  By opting to use and accept these [employer], I, the undersigned, hereby agree on behalf of myself, and on behalf of any minor child using the [employer] for whom I am the parent or legal guardian, as follows:  1. Science writer provided to me are provided by independent third-party transportation providers who are not [employer] or employees and who are unaffiliated with [employer]. 2. [facility] is neither a transportation carrier nor a common or public carrier. 3. [facility] has no control over the quality or safety of the transportation that occurs as a result of the [employer]. 4. [facility]  cannot guarantee that any third-party transportation provider will complete any arranged transportation service. 5. [facility] makes no representation, warranty, or guarantee regarding the reliability, timeliness, quality, safety, suitability, or availability of any of the Transport Services or that they will be error free. 6. I fully understand that traveling by vehicle involves risks and dangers of serious bodily injury, including permanent disability, paralysis, and death. I agree, on behalf of myself and on behalf of any minor child using the Transport Services for whom I am the parent or legal guardian, that the entire risk arising out of my use of the [employer] remains solely with me, to the maximum extent permitted under applicable law. 7. The  [employer] are provided "as is" and "as available." [facility] disclaims all representations and warranties, express, implied or statutory, not expressly set out in these terms, including the implied warranties of merchantability and fitness for a particular purpose. 8. I hereby waive and release [facility], its agents, employees, officers, directors, representatives, insurers, attorneys, assigns, successors, subsidiaries, and affiliates from any and all past, present, or future claims, demands, liabilities, actions, causes of action, or suits of any kind directly or indirectly arising from acceptance and use of the [employer]. 9. I further waive and release [facility] and its affiliates from all present and future liability and responsibility for any injury or death to persons or damages to property caused by or related to the use of the [employer]. 10. I have read this Waiver and Release of Liability, and I understand the terms used in it and their legal significance. This Waiver is freely and voluntarily given with the understanding that my right (as well as the right of any minor child for whom I am the parent or legal guardian using the [employer]) to legal recourse against [facility] in connection with the [employer] is knowingly surrendered in return for use of these services.   I attest that I read the consent document to Mittie Bodo, gave Mr. Lamagna the opportunity to ask questions and answered the questions asked (if any). I affirm that Mittie Bodo then provided consent for he's participation in this program.    Evette Doffing

## 2021-03-25 NOTE — Telephone Encounter (Signed)
CM called to transportation department with [facility] and set up profile for patient for tomorrow's appointment as well as any future appointments. Call to patient at friend's home and verified address/phone numbers. Made aware to turn off vac and he verbalized understanding of this as well as appt time tomorrow. Transportation states they will reach out to patient to update on his pick up time tomorrow.

## 2021-03-25 NOTE — Telephone Encounter (Signed)
[name] Banker) from [facility] called requesting a call back from Autumn F about patient's wound Vac and dressing for wound care. Also pt has no transportation and will need a call back from [employer] for transportation. Please call [name] back at [phone].

## 2021-03-26 ENCOUNTER — Ambulatory Visit: Payer: Self-pay | Admitting: Orthopedic Surgery

## 2021-03-30 ENCOUNTER — Other Ambulatory Visit: Payer: Self-pay | Admitting: Gerontology

## 2021-03-30 ENCOUNTER — Telehealth: Payer: Self-pay

## 2021-03-30 ENCOUNTER — Encounter: Payer: Self-pay | Admitting: Orthopedic Surgery

## 2021-03-30 ENCOUNTER — Other Ambulatory Visit: Payer: Self-pay

## 2021-03-30 ENCOUNTER — Ambulatory Visit (INDEPENDENT_AMBULATORY_CARE_PROVIDER_SITE_OTHER): Payer: Self-pay | Admitting: Physician Assistant

## 2021-03-30 DIAGNOSIS — T8781 Dehiscence of amputation stump: Secondary | ICD-10-CM

## 2021-03-30 DIAGNOSIS — E119 Type 2 diabetes mellitus without complications: Secondary | ICD-10-CM

## 2021-03-30 MED ORDER — AMLODIPINE BESYLATE 10 MG PO TABS
ORAL_TABLET | Freq: Every day | ORAL | 0 refills | Status: DC
  Filled 2021-03-30 – 2021-04-01 (×3): qty 14, 14d supply, fill #0

## 2021-03-30 MED ORDER — GLIPIZIDE 10 MG PO TABS
ORAL_TABLET | ORAL | 3 refills | Status: DC
  Filled 2021-03-30 – 2021-04-01 (×3): qty 60, 30d supply, fill #0
  Filled 2021-05-22: qty 60, 30d supply, fill #1
  Filled 2021-07-01: qty 60, 30d supply, fill #2

## 2021-03-30 MED ORDER — OXYCODONE HCL 10 MG PO TABS: 5.0000 mg | ORAL_TABLET | ORAL | 0 refills | Status: DC | PRN

## 2021-03-30 MED ORDER — LISINOPRIL 40 MG PO TABS
ORAL_TABLET | Freq: Every day | ORAL | 0 refills | Status: DC
Start: 2021-03-30 — End: 2021-04-21
  Filled 2021-03-30 – 2021-04-01 (×3): qty 14, 14d supply, fill #0

## 2021-03-30 MED FILL — Metformin HCl Tab 1000 MG: ORAL | 30 days supply | Qty: 60 | Fill #0 | Status: AC

## 2021-03-30 NOTE — Telephone Encounter (Signed)
Called and sw HHPT to advise verbal ok for orders as requested below. To call with any questions.

## 2021-03-30 NOTE — Telephone Encounter (Signed)
[name] from [facility] called he is requesting verbal orders for therapy 1w3w call [phone] [phone]

## 2021-03-30 NOTE — Progress Notes (Signed)
Office Visit Note   Patient: Justin Landry           Date of Birth: 06/19/1961           MRN: [id number] Visit Date: 03/30/2021              Requested by: Rolm Gala, NP [address] [place],  [place] [address] PCP: Rolm Gala, NP  Chief Complaint  Patient presents with  . Left Leg - Routine Post Op    03/18/21 left BKA d/c vac today pt turned off 4 days ago. No drainage in vac D/c home from hospital wearing shrinker photo obtained.       HPI: Patient presents today status post below-knee amputation.  He is doing very well.  His wound VAC did not stop functioning about 4 days ago.  He is requesting a refill of his pain medication he is smoking 2 cigarettes daily  Assessment & Plan: Visit Diagnoses: No diagnosis found.  Plan: Patient may begin doing daily Dial cleansing.  He does not have anyone to get him a second shrinker so he will go between a shrinker and a dry compressive dressing.  Follow-up in 1 week Patient counseled in smoking cessation Follow-Up Instructions: No follow-ups on file.   Ortho Exam  Patient is alert, oriented, no adenopathy, well-dressed, normal affect, normal respiratory effort. Well apposed wound edges minimal serous dressing no dehiscence no erythema no ascending cellulitis or signs of infection  Imaging: No results found.    Labs: Lab Results  Component Value Date   HGBA1C 7.5 (H) 03/18/2021   HGBA1C 7.5 (H) 09/24/2020   HGBA1C 7.2 (H) 09/10/2020   REPTSTATUS 09/11/2020 FINAL 09/10/2020   GRAMSTAIN  06/23/2019    FEW WBC PRESENT, PREDOMINANTLY PMN RARE GRAM POSITIVE COCCI    CULT (A) 09/10/2020    <10,000 COLONIES/mL INSIGNIFICANT GROWTH Performed at [facility], [address], [place], [place] [address]    LABORGA LECLERCIA ADECARBOXYLATA 06/23/2019   LABORGA STAPHYLOCOCCUS CAPRAE 06/23/2019   LABORGA STAPHYLOCOCCUS SIMULANS 06/23/2019     Lab Results  Component Value Date   ALBUMIN  4.2 09/10/2020   ALBUMIN 4.5 07/14/2020   ALBUMIN 4.5 06/25/2020    No results found for: MG No results found for: VD25OH  No results found for: PREALBUMIN CBC EXTENDED Latest Ref Rng & Units 03/20/2021 03/19/2021 03/15/2021  WBC 4.0 - 10.5 K/uL 12.1(H) 14.3(H) 12.9(H)  RBC 4.22 - 5.81 MIL/uL 4.56 4.27 4.50  HGB 13.0 - 17.0 g/dL 82.9 12.7(L) 13.8  HCT 39.0 - 52.0 % 38.9(L) 37.5(L) 39.8  PLT 150 - 400 K/uL 395 392 430(H)  NEUTROABS 1.7 - 7.7 K/uL - - 8.9(H)  LYMPHSABS 0.7 - 4.0 K/uL - - 2.5     There is no height or weight on file to calculate BMI.  Orders:  No orders of the defined types were placed in this encounter.  Meds ordered this encounter  Medications  . Oxycodone HCl 10 MG TABS    Sig: Take 0.5-1 tablets (5-10 mg total) by mouth every 4 (four) hours as needed.    Dispense:  30 tablet    Refill:  0     Procedures: No procedures performed  Clinical Data: No additional findings.  ROS:  All other systems negative, except as noted in the HPI. Review of Systems  Objective: Vital Signs: There were no vitals taken for this visit.  Specialty Comments:  No specialty comments available.  PMFS  History: Patient Active Problem List   Diagnosis Date Noted  . Acute osteomyelitis of left foot (HCC) 03/18/2021  . Cutaneous abscess of left foot   . Dehiscence of amputation stump (HCC)   . Subacute osteomyelitis of left foot (HCC)   . Rupture of operation wound   . Subacute osteomyelitis, left ankle and foot (HCC)   . Overgrown toenails 10/30/2020  . Pre-operative clearance 09/16/2020  . Abnormal EKG 09/16/2020  . Chronic left shoulder pain 04/10/2020  . Toe pain 02/21/2020  . Anxiety 02/08/2020  . Left knee pain 02/07/2020  . Inguinodynia, right 11/13/2019  . Elevated lipids 10/04/2019  . 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 History of right inguinal hernia 08/14/2019  . Encounter to  establish care 08/14/2019  . Anxiety 08/14/2019  . Right shoulder pain 08/14/2019  . Cellulitis 06/22/2019  . Essential hypertension 05/27/2015  . Type 2 diabetes mellitus without complication (HCC) 05/27/2015  . Diverticulosis of large intestine without hemorrhage 10/16/2014  . Recurrent right inguinal hernia 09/27/2014  . Tobacco use 08/01/2014  . Hematuria, gross 07/03/2014  . Lower urinary tract symptoms (LUTS) 07/03/2014  . Diabetes mellitus (HCC) 06/25/2014  . Inguinal hernia, bilateral  04/10/2014   Past Medical History:  Diagnosis Date  . Arthritis    right shoulder  . Depression   . Diabetes mellitus    Type II  . Headache    migraines  . High cholesterol   . Humerus fracture    right  . Hypertension   . Inguinal hernia   . Neuromuscular disorder (HCC)    neuropathy    Family History  Problem Relation Age of Onset  . Diabetes Mother   . Hypertension Mother   . Diabetes Father   . Hypertension Father   . Cancer Brother        brain    Past Surgical History:  Procedure Laterality Date  . AMPUTATION Left 12/10/2020   Procedure: LEFT GREAT TOE AMPUTATION;  Surgeon: [name], MD;  Location: [facility] OR;  Service: Orthopedics;  Laterality: Left;  . AMPUTATION Left 01/21/2021   Procedure: 1ST AND 2ND RAY AMPUTATION LEFT FOOT;  Surgeon: [name], MD;  Location: [facility] OR;  Service: Orthopedics;  Laterality: Left;  . AMPUTATION Left 03/18/2021   Procedure: LEFT BELOW KNEE AMPUTATION;  Surgeon: [name], MD;  Location: [facility] OR;  Service: Orthopedics;  Laterality: Left;  . AMPUTATION TOE Left 06/23/2019   Procedure: AMPUTATION TOE LEFT AMPUTATION;  Surgeon: Linus Galas, DPM;  Location: [facility] ORS;  Service: Podiatry;  Laterality: Left;  . AMPUTATION TOE Left 08/31/2019   Procedure: AMPUTATION TOE  IPJ [id number];  Surgeon: Linus Galas, DPM;  Location: [facility] ORS;  Service: Podiatry;  Laterality: Left;  . CYSTOSCOPY  2015   Biopsy  . HERNIA REPAIR Right 2008   [facility]   . REVERSE SHOULDER ARTHROPLASTY Right 09/18/2020   Procedure: RIGHT REVERSE SHOULDER ARTHROPLASTY;  Surgeon: Cammy Copa, MD;  Location: [facility] OR;  Service: Orthopedics;  Laterality: Right;   Social History   Occupational History  . Occupation: unemployed  Tobacco Use  . Smoking status: Current Every Day Smoker    Packs/day: 0.25    Years: 43.00    Pack years: 10.75    Types: Cigarettes  . Smokeless tobacco: Never Used  . Tobacco comment: cut down to quarter pack a day, aware of resources  Vaping Use  .  Vaping Use: Never used  Substance and Sexual Activity  . Alcohol use: Not Currently    Alcohol/week: 2.0 standard drinks    Types: 2 Cans of beer per week    Comment: occasional beer   . Drug use: Yes    Frequency: 7.0 times per week    Types: Marijuana    Comment: for neuropathy - last use 03/08/21  . Sexual activity: Yes

## 2021-03-31 ENCOUNTER — Telehealth: Payer: Self-pay

## 2021-03-31 ENCOUNTER — Other Ambulatory Visit: Payer: Self-pay

## 2021-03-31 NOTE — Telephone Encounter (Signed)
[facility] from [facility] called she would like to know if the nurses need to be doing wound care there or will the patient be coming to the office call back:[phone]

## 2021-04-01 ENCOUNTER — Other Ambulatory Visit: Payer: Self-pay

## 2021-04-01 NOTE — Telephone Encounter (Signed)
I called and advised that the pt was in the office Monday and that the wound vac was removed and the pt was healing well and is washing with dial soap and aply a dry compression dressing and order given for shrinker . HHN not needed at this time.

## 2021-04-02 ENCOUNTER — Other Ambulatory Visit: Payer: Self-pay

## 2021-04-02 ENCOUNTER — Other Ambulatory Visit: Payer: Self-pay | Admitting: Gerontology

## 2021-04-02 DIAGNOSIS — E119 Type 2 diabetes mellitus without complications: Secondary | ICD-10-CM

## 2021-04-02 MED ORDER — GABAPENTIN 400 MG PO CAPS
400.0000 mg | ORAL_CAPSULE | Freq: Three times a day (TID) | ORAL | 2 refills | Status: DC
  Filled 2021-04-02: qty 90, 30d supply, fill #0

## 2021-04-06 ENCOUNTER — Encounter: Payer: Self-pay | Admitting: Orthopedic Surgery

## 2021-04-06 ENCOUNTER — Ambulatory Visit (INDEPENDENT_AMBULATORY_CARE_PROVIDER_SITE_OTHER): Payer: Self-pay | Admitting: Orthopedic Surgery

## 2021-04-06 VITALS — BP 108/74 | HR 84

## 2021-04-06 DIAGNOSIS — Z89512 Acquired absence of left leg below knee: Secondary | ICD-10-CM

## 2021-04-06 MED ORDER — OXYCODONE HCL 5 MG PO TABS: 5.0000 mg | ORAL_TABLET | ORAL | 0 refills | Status: DC | PRN

## 2021-04-06 NOTE — Progress Notes (Signed)
Office Visit Note   Patient: Justin Landry           Date of Birth: 1961/06/03           MRN: [id number] Visit Date: 04/06/2021              Requested by: Rolm Gala, NP [address] [place],  [place] [address] PCP: Rolm Gala, NP  Chief Complaint  Patient presents with  . Left Leg - Routine Post Op    03/18/21 left BKA       HPI: Patient is a 60 year old gentleman who is 2 weeks status post left transtibial amputation he is currently wearing a stump shrinker and a limb protector.  Patient states he has been having dizziness when he stands up quickly and also states that he has muscle shaking in the left leg.  Assessment & Plan: Visit Diagnoses:  1. Hx of BKA, left (HCC)     Plan: We will check his blood pressure he will discuss with his primary care physician whether he should stay on both the Norvasc and the lisinopril or change his blood pressure regimen.  Recommended coconut water for the muscle spasm.  Refill prescription for the oxycodone.  Follow-up in the office in 1 week to remove the remaining staples  Follow-Up Instructions: Return in about 1 week (around 04/13/2021).   Ortho Exam  Patient is alert, oriented, no adenopathy, well-dressed, normal affect, normal respiratory effort. Examination of the residual limb is consolidating nicely there is no redness no cellulitis no drainage there is good epithelialization around the wound edges.  Imaging: No results found. No images are attached to the encounter.  Labs: Lab Results  Component Value Date   HGBA1C 7.5 (H) 03/18/2021   HGBA1C 7.5 (H) 09/24/2020   HGBA1C 7.2 (H) 09/10/2020   REPTSTATUS 09/11/2020 FINAL 09/10/2020   GRAMSTAIN  06/23/2019    FEW WBC PRESENT, PREDOMINANTLY PMN RARE GRAM POSITIVE COCCI    CULT (A) 09/10/2020    <10,000 COLONIES/mL INSIGNIFICANT GROWTH Performed at [facility], [address], [place], [place] [address]    LABORGA LECLERCIA  ADECARBOXYLATA 06/23/2019   LABORGA STAPHYLOCOCCUS CAPRAE 06/23/2019   LABORGA STAPHYLOCOCCUS SIMULANS 06/23/2019     Lab Results  Component Value Date   ALBUMIN 4.2 09/10/2020   ALBUMIN 4.5 07/14/2020   ALBUMIN 4.5 06/25/2020    No results found for: MG No results found for: VD25OH  No results found for: PREALBUMIN CBC EXTENDED Latest Ref Rng & Units 03/20/2021 03/19/2021 03/15/2021  WBC 4.0 - 10.5 K/uL 12.1(H) 14.3(H) 12.9(H)  RBC 4.22 - 5.81 MIL/uL 4.56 4.27 4.50  HGB 13.0 - 17.0 g/dL 51.7 12.7(L) 13.8  HCT 39.0 - 52.0 % 38.9(L) 37.5(L) 39.8  PLT 150 - 400 K/uL 395 392 430(H)  NEUTROABS 1.7 - 7.7 K/uL - - 8.9(H)  LYMPHSABS 0.7 - 4.0 K/uL - - 2.5     There is no height or weight on file to calculate BMI.  Orders:  No orders of the defined types were placed in this encounter.  No orders of the defined types were placed in this encounter.    Procedures: No procedures performed  Clinical Data: No additional findings.  ROS:  All other systems negative, except as noted in the HPI. Review of Systems  Objective: Vital Signs: There were no vitals taken for this visit.  Specialty Comments:  No specialty comments available.  PMFS History: Patient Active Problem List  Diagnosis Date Noted  . Acute osteomyelitis of left foot (HCC) 03/18/2021  . Cutaneous abscess of left foot   . Dehiscence of amputation stump (HCC)   . Subacute osteomyelitis of left foot (HCC)   . Rupture of operation wound   . Subacute osteomyelitis, left ankle and foot (HCC)   . Overgrown toenails 10/30/2020  . Pre-operative clearance 09/16/2020  . Abnormal EKG 09/16/2020  . Chronic left shoulder pain 04/10/2020  . Toe pain 02/21/2020  . Anxiety 02/08/2020  . Left knee pain 02/07/2020  . Inguinodynia, right 11/13/2019  . Elevated lipids 10/04/2019  . 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 History of right inguinal  hernia 08/14/2019  . Encounter to establish care 08/14/2019  . Anxiety 08/14/2019  . Right shoulder pain 08/14/2019  . Cellulitis 06/22/2019  . Essential hypertension 05/27/2015  . Type 2 diabetes mellitus without complication (HCC) 05/27/2015  . Diverticulosis of large intestine without hemorrhage 10/16/2014  . Recurrent right inguinal hernia 09/27/2014  . Tobacco use 08/01/2014  . Hematuria, gross 07/03/2014  . Lower urinary tract symptoms (LUTS) 07/03/2014  . Diabetes mellitus (HCC) 06/25/2014  . Inguinal hernia, bilateral  04/10/2014   Past Medical History:  Diagnosis Date  . Arthritis    right shoulder  . Depression   . Diabetes mellitus    Type II  . Headache    migraines  . High cholesterol   . Humerus fracture    right  . Hypertension   . Inguinal hernia   . Neuromuscular disorder (HCC)    neuropathy    Family History  Problem Relation Age of Onset  . Diabetes Mother   . Hypertension Mother   . Diabetes Father   . Hypertension Father   . Cancer Brother        brain    Past Surgical History:  Procedure Laterality Date  . AMPUTATION Left 12/10/2020   Procedure: LEFT GREAT TOE AMPUTATION;  Surgeon: [name], MD;  Location: [facility] OR;  Service: Orthopedics;  Laterality: Left;  . AMPUTATION Left 01/21/2021   Procedure: 1ST AND 2ND RAY AMPUTATION LEFT FOOT;  Surgeon: [name], MD;  Location: [facility] OR;  Service: Orthopedics;  Laterality: Left;  . AMPUTATION Left 03/18/2021   Procedure: LEFT BELOW KNEE AMPUTATION;  Surgeon: [name], MD;  Location: [facility] OR;  Service: Orthopedics;  Laterality: Left;  . AMPUTATION TOE Left 06/23/2019   Procedure: AMPUTATION TOE LEFT AMPUTATION;  Surgeon: Linus Galas, DPM;  Location: [facility] ORS;  Service: Podiatry;  Laterality: Left;  . AMPUTATION TOE Left 08/31/2019   Procedure: AMPUTATION TOE  IPJ [id number];  Surgeon: Linus Galas, DPM;  Location: [facility] ORS;  Service: Podiatry;  Laterality: Left;  . CYSTOSCOPY  2015   Biopsy  . HERNIA  REPAIR Right 2008   [facility]  . REVERSE SHOULDER ARTHROPLASTY Right 09/18/2020   Procedure: RIGHT REVERSE SHOULDER ARTHROPLASTY;  Surgeon: Cammy Copa, MD;  [facility];  Service: Orthopedics;  Laterality: Right;   Social History   Occupational History  . Occupation: unemployed  Tobacco Use  . Smoking status: Current Every Day Smoker    Packs/day: 0.25    Years: 43.00    Pack years: 10.75    Types: Cigarettes  . Smokeless tobacco: Never Used  . Tobacco comment: cut down to quarter pack a day, aware of resources  Vaping Use  . Vaping Use: Never used  Substance  and Sexual Activity  . Alcohol use: Not Currently    Alcohol/week: 2.0 standard drinks    Types: 2 Cans of beer per week    Comment: occasional beer   . Drug use: Yes    Frequency: 7.0 times per week    Types: Marijuana    Comment: for neuropathy - last use 03/08/21  . Sexual activity: Yes

## 2021-04-13 ENCOUNTER — Ambulatory Visit: Payer: Self-pay

## 2021-04-13 ENCOUNTER — Ambulatory Visit (INDEPENDENT_AMBULATORY_CARE_PROVIDER_SITE_OTHER): Payer: Self-pay | Admitting: Physician Assistant

## 2021-04-13 ENCOUNTER — Telehealth: Payer: Self-pay | Admitting: Orthopedic Surgery

## 2021-04-13 ENCOUNTER — Other Ambulatory Visit: Payer: Self-pay | Admitting: Orthopedic Surgery

## 2021-04-13 ENCOUNTER — Telehealth: Payer: Self-pay | Admitting: Physician Assistant

## 2021-04-13 ENCOUNTER — Encounter: Payer: Self-pay | Admitting: Orthopedic Surgery

## 2021-04-13 ENCOUNTER — Other Ambulatory Visit: Payer: Self-pay | Admitting: Physician Assistant

## 2021-04-13 DIAGNOSIS — M533 Sacrococcygeal disorders, not elsewhere classified: Secondary | ICD-10-CM

## 2021-04-13 NOTE — Telephone Encounter (Signed)
Pt called stating he was supposed have a rx called in no later than 1 so he was waiting at his pharmacy and nothing has been sent. Pt won't have a way to get back after today so he would like this sent as soon as possible. Pt would like a Cb when this has been called in.   [phone]

## 2021-04-13 NOTE — Telephone Encounter (Signed)
Patient called being very rude about oxycodone not being called in by [facility]. Please send to pharmacy on file. Patient phone number is [phone].

## 2021-04-13 NOTE — Progress Notes (Signed)
Office Visit Note   Patient: Justin Landry           Date of Birth: 16-Jan-1961           MRN: [id number] Visit Date: 04/13/2021              Requested by: Rolm Gala, NP [address] [place],  [place] [address] PCP: Rolm Gala, NP  Chief Complaint  Patient presents with  . Left Leg - Routine Post Op    03/18/21 left BKA       HPI: Patient is almost 1 month status post left below-knee amputation.  He has had quite a few falls and is concerned that he may have fractured his coccyx.  Assessment & Plan: Visit Diagnoses:  1. Coccyx pain     Plan: Patient will obtain a donut as needed.  We will continue with shrinker rather than wrap on amputation stump.  We will follow-up in 1 week for reevaluation hopefully get more staples out and provided prescription for a prosthetic  Follow-Up Instructions: No follow-ups on file.   Ortho Exam  Patient is alert, oriented, no adenopathy, well-dressed, normal affect, normal respiratory effort. Amputation stump overall well apposed wound edges he does have some very superficial necrosis and some areas and some eschar.  Some of the staples were removed he had a slight dehiscence on the lateral side.  Steri-Strip was loosely placed without any pressure.  No sign of infection no ascending cellulitis  Imaging: No results found. No images are attached to the encounter.  Labs: Lab Results  Component Value Date   HGBA1C 7.5 (H) 03/18/2021   HGBA1C 7.5 (H) 09/24/2020   HGBA1C 7.2 (H) 09/10/2020   REPTSTATUS 09/11/2020 FINAL 09/10/2020   GRAMSTAIN  06/23/2019    FEW WBC PRESENT, PREDOMINANTLY PMN RARE GRAM POSITIVE COCCI    CULT (A) 09/10/2020    <10,000 COLONIES/mL INSIGNIFICANT GROWTH Performed at [facility], [address], [place], [place] [address]    LABORGA LECLERCIA ADECARBOXYLATA 06/23/2019   LABORGA STAPHYLOCOCCUS CAPRAE 06/23/2019   LABORGA STAPHYLOCOCCUS SIMULANS 06/23/2019      Lab Results  Component Value Date   ALBUMIN 4.2 09/10/2020   ALBUMIN 4.5 07/14/2020   ALBUMIN 4.5 06/25/2020    No results found for: MG No results found for: VD25OH  No results found for: PREALBUMIN CBC EXTENDED Latest Ref Rng & Units 03/20/2021 03/19/2021 03/15/2021  WBC 4.0 - 10.5 K/uL 12.1(H) 14.3(H) 12.9(H)  RBC 4.22 - 5.81 MIL/uL 4.56 4.27 4.50  HGB 13.0 - 17.0 g/dL 82.9 12.7(L) 13.8  HCT 39.0 - 52.0 % 38.9(L) 37.5(L) 39.8  PLT 150 - 400 K/uL 395 392 430(H)  NEUTROABS 1.7 - 7.7 K/uL - - 8.9(H)  LYMPHSABS 0.7 - 4.0 K/uL - - 2.5     There is no height or weight on file to calculate BMI.  Orders:  Orders Placed This Encounter  Procedures  . XR Sacrum/Coccyx   No orders of the defined types were placed in this encounter.    Procedures: No procedures performed  Clinical Data: No additional findings.  ROS:  All other systems negative, except as noted in the HPI. Review of Systems  Objective: Vital Signs: There were no vitals taken for this visit.  Specialty Comments:  No specialty comments available.  PMFS History: Patient Active Problem List   Diagnosis Date Noted  . Acute osteomyelitis of left foot (HCC) 03/18/2021  . Cutaneous abscess of left foot   .  Dehiscence of amputation stump (HCC)   . Subacute osteomyelitis of left foot (HCC)   . Rupture of operation wound   . Subacute osteomyelitis, left ankle and foot (HCC)   . Overgrown toenails 10/30/2020  . Pre-operative clearance 09/16/2020  . Abnormal EKG 09/16/2020  . Chronic left shoulder pain 04/10/2020  . Toe pain 02/21/2020  . Anxiety 02/08/2020  . Left knee pain 02/07/2020  . Inguinodynia, right 11/13/2019  . Elevated lipids 10/04/2019  . 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 History of right inguinal hernia 08/14/2019  . Encounter to establish care 08/14/2019  . Anxiety 08/14/2019  . Right shoulder pain  08/14/2019  . Cellulitis 06/22/2019  . Essential hypertension 05/27/2015  . Type 2 diabetes mellitus without complication (HCC) 05/27/2015  . Diverticulosis of large intestine without hemorrhage 10/16/2014  . Recurrent right inguinal hernia 09/27/2014  . Tobacco use 08/01/2014  . Hematuria, gross 07/03/2014  . Lower urinary tract symptoms (LUTS) 07/03/2014  . Diabetes mellitus (HCC) 06/25/2014  . Inguinal hernia, bilateral  04/10/2014   Past Medical History:  Diagnosis Date  . Arthritis    right shoulder  . Depression   . Diabetes mellitus    Type II  . Headache    migraines  . High cholesterol   . Humerus fracture    right  . Hypertension   . Inguinal hernia   . Neuromuscular disorder (HCC)    neuropathy    Family History  Problem Relation Age of Onset  . Diabetes Mother   . Hypertension Mother   . Diabetes Father   . Hypertension Father   . Cancer Brother        brain    Past Surgical History:  Procedure Laterality Date  . AMPUTATION Left 12/10/2020   Procedure: LEFT GREAT TOE AMPUTATION;  Surgeon: [name], MD;  Location[facility];  Service: Orthopedics;  Laterality: Left;  . AMPUTATION Left 01/21/2021   Procedure: 1ST AND 2ND RAY AMPUTATION LEFT FOOT;  Surgeon: [name], MD;  Location: [facility] OR;  Service: Orthopedics;  Laterality: Left;  . AMPUTATION Left 03/18/2021   Procedure: LEFT BELOW KNEE AMPUTATION;  Surgeon: [name], MD;  Location: [facility] OR;  Service: Orthopedics;  Laterality: Left;  . AMPUTATION TOE Left 06/23/2019   Procedure: AMPUTATION TOE LEFT AMPUTATION;  Surgeon: Linus Galas, DPM;  Location: [facility] ORS;  Service: Podiatry;  Laterality: Left;  . AMPUTATION TOE Left 08/31/2019   Procedure: AMPUTATION TOE  IPJ [id number];  Surgeon: Linus Galas, DPM;  Location: [facility] ORS;  Service: Podiatry;  Laterality: Left;  . CYSTOSCOPY  2015   Biopsy  . HERNIA REPAIR Right 2008   [facility]  . REVERSE SHOULDER ARTHROPLASTY Right 09/18/2020   Procedure: RIGHT  REVERSE SHOULDER ARTHROPLASTY;  Surgeon: Cammy Copa, MD;  Location: [facility] OR;  Service: Orthopedics;  Laterality: Right;   Social History   Occupational History  . Occupation: unemployed  Tobacco Use  . Smoking status: Current Every Day Smoker    Packs/day: 0.25    Years: 43.00    Pack years: 10.75    Types: Cigarettes  . Smokeless tobacco: Never Used  . Tobacco comment: cut down to quarter pack a day, aware of resources  Vaping Use  . Vaping Use: Never used  Substance and Sexual Activity  . Alcohol use: Not Currently    Alcohol/week: 2.0 standard drinks    Types: 2 Cans  of beer per week    Comment: occasional beer   . Drug use: Yes    Frequency: 7.0 times per week    Types: Marijuana    Comment: for neuropathy - last use 03/08/21  . Sexual activity: Yes

## 2021-04-14 ENCOUNTER — Other Ambulatory Visit: Payer: Self-pay | Admitting: Physician Assistant

## 2021-04-14 MED ORDER — OXYCODONE HCL 5 MG PO CAPS: 5.0000 mg | ORAL_CAPSULE | Freq: Four times a day (QID) | ORAL | 0 refills | Status: DC | PRN

## 2021-04-14 NOTE — Telephone Encounter (Signed)
Was able to call in rx this morning

## 2021-04-15 ENCOUNTER — Telehealth: Payer: Self-pay | Admitting: Orthopedic Surgery

## 2021-04-15 NOTE — Telephone Encounter (Signed)
Justin Landry with [facility] called stating their office does PT with the pt and he reported the pain in his L stump feels like hundreds of wasp stinging. Justin Landry is wondering if there's anything we can prescribe the pt? Justin Landry did state the pt is taking gabapentin 400 mg 3 times a day, and she would like our office to contact him and let him know what we will be able to do.   [phone]

## 2021-04-15 NOTE — Telephone Encounter (Signed)
PLEASE SEE BELOW AND ADVISE

## 2021-04-16 ENCOUNTER — Other Ambulatory Visit: Payer: Self-pay | Admitting: Physician Assistant

## 2021-04-16 MED ORDER — PREGABALIN 50 MG PO CAPS: 50.0000 mg | ORAL_CAPSULE | Freq: Three times a day (TID) | ORAL | 2 refills | Status: DC

## 2021-04-16 NOTE — Telephone Encounter (Signed)
Spoke with the patient he is going to stop the gabapentin and I will call in Lyrica.  Also the next time he call in pain medication he only wants the tablets because that is what is covered by his insurance.

## 2021-04-20 ENCOUNTER — Other Ambulatory Visit: Payer: Self-pay | Admitting: Physician Assistant

## 2021-04-20 ENCOUNTER — Telehealth: Payer: Self-pay | Admitting: *Deleted

## 2021-04-20 ENCOUNTER — Ambulatory Visit: Payer: Self-pay | Admitting: Orthopedic Surgery

## 2021-04-20 ENCOUNTER — Encounter: Payer: Self-pay | Admitting: Orthopedic Surgery

## 2021-04-20 ENCOUNTER — Telehealth: Payer: Self-pay | Admitting: Physician Assistant

## 2021-04-20 MED ORDER — OXYCODONE-ACETAMINOPHEN 5-325 MG PO TABS: 1.0000 | ORAL_TABLET | Freq: Three times a day (TID) | ORAL | 0 refills | Status: DC | PRN

## 2021-04-20 NOTE — Telephone Encounter (Signed)
Message from clinic staff requesting assistance with transportation for patient as he had to cancel today. Call to transportation with [facility] and scheduled for next appt on Friday, 04/24/21 at 10:15 am. They will call patient and provide pick up time. Call to patient and gave him number for future appointments and scheduling.

## 2021-04-20 NOTE — Telephone Encounter (Signed)
PT wants a refill on oxycodone tablets in.

## 2021-04-20 NOTE — Telephone Encounter (Signed)
Called in . Rx must last 10 days

## 2021-04-21 ENCOUNTER — Other Ambulatory Visit: Payer: Self-pay

## 2021-04-21 ENCOUNTER — Encounter: Payer: Self-pay | Admitting: Gerontology

## 2021-04-21 ENCOUNTER — Ambulatory Visit: Payer: Self-pay | Admitting: Gerontology

## 2021-04-21 VITALS — BP 149/79 | HR 89 | Temp 98.1°F | Resp 16

## 2021-04-21 DIAGNOSIS — E1169 Type 2 diabetes mellitus with other specified complication: Secondary | ICD-10-CM | POA: Insufficient documentation

## 2021-04-21 DIAGNOSIS — E118 Type 2 diabetes mellitus with unspecified complications: Secondary | ICD-10-CM | POA: Insufficient documentation

## 2021-04-21 DIAGNOSIS — G6289 Other specified polyneuropathies: Secondary | ICD-10-CM

## 2021-04-21 DIAGNOSIS — I10 Essential (primary) hypertension: Secondary | ICD-10-CM

## 2021-04-21 MED ORDER — LISINOPRIL 40 MG PO TABS
40.0000 mg | ORAL_TABLET | Freq: Every day | ORAL | 2 refills | Status: DC
  Filled 2021-04-21 – 2021-05-28 (×2): qty 30, 30d supply, fill #0
  Filled 2021-07-01: qty 30, 30d supply, fill #1

## 2021-04-21 MED ORDER — GABAPENTIN 400 MG PO CAPS
400.0000 mg | ORAL_CAPSULE | Freq: Three times a day (TID) | ORAL | 2 refills | Status: DC
  Filled 2021-04-21 – 2021-05-22 (×2): qty 90, 30d supply, fill #0
  Filled 2021-07-01: qty 90, 30d supply, fill #1

## 2021-04-21 MED ORDER — METFORMIN HCL 1000 MG PO TABS
ORAL_TABLET | Freq: Two times a day (BID) | ORAL | 2 refills | Status: DC
  Filled 2021-04-21 – 2021-05-22 (×2): qty 60, 30d supply, fill #0
  Filled 2021-07-01: qty 60, 30d supply, fill #1

## 2021-04-21 MED ORDER — AMLODIPINE BESYLATE 10 MG PO TABS
10.0000 mg | ORAL_TABLET | Freq: Every day | ORAL | 2 refills | Status: DC
  Filled 2021-04-21 – 2021-05-22 (×2): qty 30, 30d supply, fill #0
  Filled 2021-07-01: qty 30, 30d supply, fill #1

## 2021-04-21 NOTE — Progress Notes (Signed)
Established Patient Office Visit  Subjective:  Patient ID: Justin Landry, male    DOB: 1961/04/15  Age: 60 y.o. MRN: [id number]  CC: No chief complaint on file.   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 He had left Below the Knee amputation ( left BKA) done on 03/18/21. He moves around in a wheel chair. He states that the stump is healing, has 4-5 staples left, covered with a stump shrinker and immobilizer. His HgbA1c done on 03/18/21 was 7.5%. He states that he's compliant with his medication, adheres to ADA diet, but has not checked his blood glucose since he discharge from the hospital on 03/24/21. His blood glucose was 111 mg/dl when checked during his visit. He states that he was not not checking his blood glucose due to his living condition. He denies hypoglycemia/hyperglycemic symptoms, but continues to experience peripheral neuropathy and can't afford Lyrica. His blood pressure was elevated during visit and he reports been out of his Lisinopril for 3 weeks. He also doesn't check his blood pressure at home. He denies chest pain, palpitation, vision changes, light headedness and medication side effects. Overall, he states that he's doing well, and offers no further complaint.  Past Medical History:  Diagnosis Date  . Arthritis    right shoulder  . Depression   . Diabetes mellitus    Type II  . Headache    migraines  . High cholesterol   . Humerus fracture    right  . Hypertension   . Inguinal hernia   . Neuromuscular disorder ([place])    neuropathy    Past Surgical History:  Procedure Laterality Date  . AMPUTATION Left 12/10/2020   Procedure: LEFT GREAT TOE AMPUTATION;  Surgeon: Newt Minion, MD;  Location: [place] [facility];  Service: Orthopedics;  Laterality: Left;  . AMPUTATION  Left 01/21/2021   Procedure: 1ST AND 2ND RAY AMPUTATION LEFT FOOT;  Surgeon: Newt Minion, MD;  Location: [place];  Service: Orthopedics;  Laterality: Left;  . AMPUTATION Left 03/18/2021   Procedure: LEFT BELOW KNEE AMPUTATION;  Surgeon: Newt Minion, MD;  Location: [place];  Service: Orthopedics;  Laterality: Left;  . AMPUTATION TOE Left 06/23/2019   Procedure: AMPUTATION TOE LEFT AMPUTATION;  Surgeon: Sharlotte Alamo, DPM;  Location: [facility] OR[facility];  Service: Podiatry;  Laterality: Left;  . AMPUTATION TOE Left 08/31/2019   Procedure: AMPUTATION TOE  IPJ [id number];  Surgeon: Sharlotte Alamo, DPM;  Location: [facility];  Service: Podiatry;  Laterality: Left;  . CYSTOSCOPY  2015   Biopsy  . HERNIA REPAIR Right 2008   [facility]  . REVERSE SHOULDER ARTHROPLASTY Right 09/18/2020   Procedure: RIGHT REVERSE SHOULDER ARTHROPLASTY;  Surgeon: Meredith Pel, MD;  Location: [place];  Service: Orthopedics;  Laterality: Right;    Family History  Problem Relation Age of Onset  . Diabetes Mother   . Hypertension Mother   . Diabetes Father   . Hypertension Father   . Cancer Brother        brain    Social History   Socioeconomic History  . Marital status: Single    Spouse name: Not on file  . Number of children: 0  . Years of education: Not on file  . Highest education level: 8th grade  Occupational History  . Occupation: unemployed  Tobacco  Use  . Smoking status: Current Every Day Smoker    Packs/day: 0.25    Years: 43.00    Pack years: 10.75    Types: Cigarettes  . Smokeless tobacco: Never Used  . Tobacco comment: cut down to quarter pack a day, aware of resources  Vaping Use  . Vaping Use: Never used  Substance and Sexual Activity  . Alcohol use: Not Currently    Alcohol/week: 2.0 standard drinks    Types: 2 Cans of beer per week    Comment: occasional beer   . Drug use: Yes    Frequency: 7.0 times per week    Types: Marijuana    Comment: for neuropathy - last use 03/08/21  . Sexual activity: Yes   Other Topics Concern  . Not on file  Social History Narrative   Social determinants completed 02/21/2020. [place] [address] consent obtained. Referral for job assistance, housing, and gas card were made in [employer] [place] portal.   Social Determinants of Health   Financial Resource Strain: Not on file  Food Insecurity: Not on file  Transportation Needs: Not on file  Physical Activity: Not on file  Stress: Not on file  Social Connections: Not on file  Intimate Partner Violence: Not on file    Outpatient Medications Prior to Visit  Medication Sig Dispense Refill  . ascorbic acid (VITAMIN C) 500 MG tablet Take 500 mg by mouth daily.     . budesonide-formoterol (SYMBICORT) 160-4.5 MCG/ACT inhaler Inhale 2 puffs into the lungs 2 (two) times daily as needed (respiratory issues.).    Marland Kitchen glipiZIDE (GLUCOTROL) 10 MG tablet TAKE 1 TABLET BY MOUTH 2 TIMES A DAY WITH A MEAL 60 tablet 3  . Glucosamine-Chondroitin (COSAMIN DS PO) Take 1 tablet by mouth daily.    Marland Kitchen oxyCODONE-acetaminophen (PERCOCET) 5-325 MG tablet Take 1 tablet by mouth every 8 (eight) hours as needed for severe pain. 20 tablet 0  . amLODipine (NORVASC) 10 MG tablet TAKE ONE TABLET BY MOUTH EVERY DAY 14 tablet 0  . lisinopril (ZESTRIL) 40 MG tablet TAKE ONE TABLET BY MOUTH EVERY DAY 14 tablet 0  . metFORMIN (GLUCOPHAGE) 1000 MG tablet TAKE ONE TABLET BY MOUTH 2 TIMES A DAY WITH A MEAL (Patient taking differently: Take 1,000 mg by mouth 2 (two) times daily with a meal.) 60 tablet 1  . blood glucose meter kit and supplies KIT Dispense based on patient and insurance preference. Use up to four times daily as directed. (FOR ICD-9 250.00, 250.01). 1 each 0  . Fish Oil-Cholecalciferol (FISH OIL + D3) 1000-1000 MG-UNIT CAPS Take 1 capsule by mouth daily with breakfast.  (Patient not taking: Reported on 04/21/2021)    . zinc sulfate 220 (50 Zn) MG capsule Take 1 capsule (220 mg total) by mouth daily. 220 capsule   . feeding supplement, GLUCERNA SHAKE, (GLUCERNA  SHAKE) LIQD Take 237 mLs by mouth 2 (two) times daily between meals. (Patient not taking: Reported on 04/21/2021) 237 mL 0  . methocarbamol (ROBAXIN) 500 MG tablet Take 1 tablet (500 mg total) by mouth every 8 (eight) hours as needed. (Patient not taking: Reported on 04/21/2021) 30 tablet 0  . oxycodone (OXY-IR) 5 MG capsule Take 1 capsule (5 mg total) by mouth every 6 (six) hours as needed. (Patient not taking: Reported on 04/21/2021) 30 capsule 0  . pregabalin (LYRICA) 50 MG capsule Take 1 capsule (50 mg total) by mouth 3 (three) times daily. (Patient not taking: Reported on 04/21/2021) 90 capsule 2  No facility-administered medications prior to visit.    Allergies  Allergen Reactions  . Anacin-3 [Acetaminophen] Nausea And Vomiting  . Morphine And Related Nausea And Vomiting and Other (See Comments)    Extreme sweating   . Oxycontin [Oxycodone Hcl] Other (See Comments)    Extreme NAusea and vomitting follows (Patient Tolerates Percocet short acting)  . Penicillins Nausea And Vomiting    Has patient had a PCN reaction causing immediate rash, facial/tongue/throat swelling, SOB or lightheadedness with hypotension: No Has patient had a PCN reaction causing severe rash involving mucus membranes or skin necrosis: No Has patient had a PCN reaction that required hospitalization No Has patient had a PCN reaction occurring within the last 10 years: No If all of the above answers are "NO", then may proceed with Cephalosporin use.   . Tramadol Nausea Only    ROS Review of Systems  Constitutional: Negative.   Eyes: Negative.   Respiratory: Negative.   Cardiovascular: Negative.   Endocrine: Negative.   Skin:       Per patient 5 staples to left BKA stump, with stump shrinker and immobilizer  Neurological: Negative.  Negative for numbness.  Psychiatric/Behavioral: Negative.       Objective:    Physical Exam HENT:     Head: Normocephalic and atraumatic.  Eyes:     Extraocular Movements:  Extraocular movements intact.     Conjunctiva/sclera: Conjunctivae normal.     Pupils: Pupils are equal, round, and reactive to light.  Cardiovascular:     Rate and Rhythm: Normal rate and regular rhythm.     Pulses: Normal pulses.     Heart sounds: Normal heart sounds.  Pulmonary:     Effort: Pulmonary effort is normal.     Breath sounds: Normal breath sounds.  Skin:    Comments: Declined removal of immobilizer and stump shrinker to assess left BKA stump  Neurological:     General: No focal deficit present.     Mental Status: He is alert and oriented to person, place, and time. Mental status is at baseline.  Psychiatric:        Mood and Affect: Mood normal.        Behavior: Behavior normal.        Thought Content: Thought content normal.        Judgment: Judgment normal.     BP (!) 149/79 (BP Location: Right Arm, Patient Position: Sitting, Cuff Size: Large)   Pulse 89   Temp 98.1 F (36.7 C)   Resp 16   SpO2 98%  Wt Readings from Last 3 Encounters:  03/18/21 210 lb (95.3 kg)  03/15/21 210 lb (95.3 kg)  01/21/21 208 lb (94.3 kg)   Weight loss encouraged  Health Maintenance Due  Topic Date Due  . PNEUMOCOCCAL POLYSACCHARIDE VACCINE AGE 43-64 HIGH RISK  Never done  . COVID-19 Vaccine (1) Never done  . OPHTHALMOLOGY EXAM  Never done  . HIV Screening  Never done  . Hepatitis C Screening  Never done  . FOOT EXAM  06/21/2020    There are no preventive care reminders to display for this patient.  No results found for: TSH Lab Results  Component Value Date   WBC 12.1 (H) 03/20/2021   HGB 13.3 03/20/2021   HCT 38.9 (L) 03/20/2021   MCV 85.3 03/20/2021   PLT 395 03/20/2021   Lab Results  Component Value Date   NA 133 (L) 03/20/2021   K 3.8 03/20/2021   CO2 24 03/20/2021  GLUCOSE 156 (H) 03/20/2021   BUN 8 03/20/2021   CREATININE 0.64 03/20/2021   BILITOT 0.5 09/10/2020   ALKPHOS 48 09/10/2020   AST 30 09/10/2020   ALT 39 09/10/2020   PROT 7.4 09/10/2020    ALBUMIN 4.2 09/10/2020   CALCIUM 9.4 03/20/2021   ANIONGAP 11 03/20/2021   GFR 87.21 05/27/2015   Lab Results  Component Value Date   CHOL 151 06/25/2020   Lab Results  Component Value Date   HDL 35 (L) 06/25/2020   Lab Results  Component Value Date   LDLCALC 65 06/25/2020   Lab Results  Component Value Date   TRIG 322 (H) 06/25/2020   Lab Results  Component Value Date   CHOLHDL 4.3 06/25/2020   Lab Results  Component Value Date   HGBA1C 7.5 (H) 03/18/2021      Assessment & Plan:     1. Type 2 diabetes mellitus with other specified complication, without long-term current use of insulin (HCC) - His HgbA1c is improving at 7.5% and his goal should be less than 7%. He will continue on current medication, advised to check fasting blood glucose daily, record and bring log to follow up appointment. His fasting blood glucose reading goal should be between 80-130 mg/dl. He was encouraged to continue on low carb/non concentrated sweet diet. - metFORMIN (GLUCOPHAGE) 1000 MG tablet; TAKE ONE TABLET BY MOUTH 2 TIMES A DAY WITH A MEAL  Dispense: 60 tablet; Refill: 2 - POCT Glucose (CBG); Future - HgB A1c; Future - Lipid panel; Future  2. Essential hypertension - His blood pressure is not under control, his goal should be less than 130/80. He will continue on current treatment regimen, and DASH diet. - amLODipine (NORVASC) 10 MG tablet; Take 1 tablet (10 mg total) by mouth daily.  Dispense: 30 tablet; Refill: 2 - lisinopril (ZESTRIL) 40 MG tablet; Take 1 tablet (40 mg total) by mouth daily.  Dispense: 30 tablet; Refill: 2  3. Other polyneuropathy - He will continue on gabapentin 400 mg tid, since he's unable to purchase Lyrica from Pharmacy. - gabapentin (NEURONTIN) 400 MG capsule; Take 1 capsule (400 mg total) by mouth 3 (three) times daily.  Dispense: 90 capsule; Refill: 2   Follow-up: Return in about 2 months (around 06/24/2021), or if symptoms worsen or fail to improve.     Denesia Donelan Jerold Coombe, NP

## 2021-04-21 NOTE — Patient Instructions (Signed)
https://www.nhlbi.nih.gov/files/docs/public/heart/dash_brief.pdf">  DASH Eating Plan DASH stands for Dietary Approaches to Stop Hypertension. The DASH eating plan is a healthy eating plan that has been shown to:  Reduce high blood pressure (hypertension).  Reduce your risk for type 2 diabetes, heart disease, and stroke.  Help with weight loss. What are tips for following this plan? Reading food labels  Check food labels for the amount of salt (sodium) per serving. Choose foods with less than 5 percent of the Daily Value of sodium. Generally, foods with less than 300 milligrams (mg) of sodium per serving fit into this eating plan.  To find whole grains, look for the word "whole" as the first word in the ingredient list. Shopping  Buy products labeled as "low-sodium" or "no salt added."  Buy fresh foods. Avoid canned foods and pre-made or frozen meals. Cooking  Avoid adding salt when cooking. Use salt-free seasonings or herbs instead of table salt or sea salt. Check with your health care provider or pharmacist before using salt substitutes.  Do not fry foods. Cook foods using healthy methods such as baking, boiling, grilling, roasting, and broiling instead.  Cook with heart-healthy oils, such as olive, canola, avocado, soybean, or sunflower oil. Meal planning  Eat a balanced diet that includes: ? 4 or more servings of fruits and 4 or more servings of vegetables each day. Try to fill one-half of your plate with fruits and vegetables. ? 6-8 servings of whole grains each day. ? Less than 6 oz (170 g) of lean meat, poultry, or fish each day. A 3-oz (85-g) serving of meat is about the same size as a deck of cards. One egg equals 1 oz (28 g). ? 2-3 servings of low-fat dairy each day. One serving is 1 cup (237 mL). ? 1 serving of nuts, seeds, or beans 5 times each week. ? 2-3 servings of heart-healthy fats. Healthy fats called omega-3 fatty acids are found in foods such as walnuts,  flaxseeds, fortified milks, and eggs. These fats are also found in cold-water fish, such as sardines, salmon, and mackerel.  Limit how much you eat of: ? Canned or prepackaged foods. ? Food that is high in trans fat, such as some fried foods. ? Food that is high in saturated fat, such as fatty meat. ? Desserts and other sweets, sugary drinks, and other foods with added sugar. ? Full-fat dairy products.  Do not salt foods before eating.  Do not eat more than 4 egg yolks a week.  Try to eat at least 2 vegetarian meals a week.  Eat more home-cooked food and less restaurant, buffet, and fast food.   Lifestyle  When eating at a restaurant, ask that your food be prepared with less salt or no salt, if possible.  If you drink alcohol: ? Limit how much you use to:  0-1 drink a day for women who are not pregnant.  0-2 drinks a day for men. ? Be aware of how much alcohol is in your drink. In the [place] one drink equals one 12 oz bottle of beer (355 mL), one 5 oz glass of wine (148 mL), or one 1 oz glass of hard liquor (44 mL). General information  Avoid eating more than 2,300 mg of salt a day. If you have hypertension, you may need to reduce your sodium intake to 1,500 mg a day.  Work with your health care provider to maintain a healthy body weight or to lose weight. Ask what an ideal weight is for   you.  Get at least 30 minutes of exercise that causes your heart to beat faster (aerobic exercise) most days of the week. Activities may include walking, swimming, or biking.  Work with your health care provider or dietitian to adjust your eating plan to your individual calorie needs. What foods should I eat? Fruits All fresh, dried, or frozen fruit. Canned fruit in natural juice (without added sugar). Vegetables Fresh or frozen vegetables (raw, steamed, roasted, or grilled). Low-sodium or reduced-sodium tomato and vegetable juice. Low-sodium or reduced-sodium tomato sauce and tomato paste.  Low-sodium or reduced-sodium canned vegetables. Grains Whole-grain or whole-wheat bread. Whole-grain or whole-wheat pasta. Brown rice. Oatmeal. Quinoa. Bulgur. Whole-grain and low-sodium cereals. Pita bread. Low-fat, low-sodium crackers. Whole-wheat flour tortillas. Meats and other proteins Skinless chicken or turkey. Ground chicken or turkey. Pork with fat trimmed off. Fish and seafood. Egg whites. Dried beans, peas, or lentils. Unsalted nuts, nut butters, and seeds. Unsalted canned beans. Lean cuts of beef with fat trimmed off. Low-sodium, lean precooked or cured meat, such as sausages or meat loaves. Dairy Low-fat (1%) or fat-free (skim) milk. Reduced-fat, low-fat, or fat-free cheeses. Nonfat, low-sodium ricotta or cottage cheese. Low-fat or nonfat yogurt. Low-fat, low-sodium cheese. Fats and oils Soft margarine without trans fats. Vegetable oil. Reduced-fat, low-fat, or light mayonnaise and salad dressings (reduced-sodium). Canola, safflower, olive, avocado, soybean, and sunflower oils. Avocado. Seasonings and condiments Herbs. Spices. Seasoning mixes without salt. Other foods Unsalted popcorn and pretzels. Fat-free sweets. The items listed above may not be a complete list of foods and beverages you can eat. Contact a dietitian for more information. What foods should I avoid? Fruits Canned fruit in a light or heavy syrup. Fried fruit. Fruit in cream or butter sauce. Vegetables Creamed or fried vegetables. Vegetables in a cheese sauce. Regular canned vegetables (not low-sodium or reduced-sodium). Regular canned tomato sauce and paste (not low-sodium or reduced-sodium). Regular tomato and vegetable juice (not low-sodium or reduced-sodium). Pickles. Olives. Grains Baked goods made with fat, such as croissants, muffins, or some breads. Dry pasta or rice meal packs. Meats and other proteins Fatty cuts of meat. Ribs. Fried meat. Bacon. Bologna, salami, and other precooked or cured meats, such as  sausages or meat loaves. Fat from the back of a pig (fatback). Bratwurst. Salted nuts and seeds. Canned beans with added salt. Canned or smoked fish. Whole eggs or egg yolks. Chicken or turkey with skin. Dairy Whole or 2% milk, cream, and half-and-half. Whole or full-fat cream cheese. Whole-fat or sweetened yogurt. Full-fat cheese. Nondairy creamers. Whipped toppings. Processed cheese and cheese spreads. Fats and oils Butter. Stick margarine. Lard. Shortening. Ghee. Bacon fat. Tropical oils, such as coconut, palm kernel, or palm oil. Seasonings and condiments Onion salt, garlic salt, seasoned salt, table salt, and sea salt. Worcestershire sauce. Tartar sauce. Barbecue sauce. Teriyaki sauce. Soy sauce, including reduced-sodium. Steak sauce. Canned and packaged gravies. Fish sauce. Oyster sauce. Cocktail sauce. Store-bought horseradish. Ketchup. Mustard. Meat flavorings and tenderizers. Bouillon cubes. Hot sauces. Pre-made or packaged marinades. Pre-made or packaged taco seasonings. Relishes. Regular salad dressings. Other foods Salted popcorn and pretzels. The items listed above may not be a complete list of foods and beverages you should avoid. Contact a dietitian for more information. Where to find more information  National Heart, Lung, and Blood Institute: www.nhlbi.nih.gov  American Heart Association: www.heart.org  Academy of Nutrition and Dietetics: www.eatright.org  National Kidney Foundation: www.kidney.org Summary  The DASH eating plan is a healthy eating plan that has been shown to   reduce high blood pressure (hypertension). It may also reduce your risk for type 2 diabetes, heart disease, and stroke.  When on the DASH eating plan, aim to eat more fresh fruits and vegetables, whole grains, lean proteins, low-fat dairy, and heart-healthy fats.  With the DASH eating plan, you should limit salt (sodium) intake to 2,300 mg a day. If you have hypertension, you may need to reduce your  sodium intake to 1,500 mg a day.  Work with your health care provider or dietitian to adjust your eating plan to your individual calorie needs. This information is not intended to replace advice given to you by your health care provider. Make sure you discuss any questions you have with your health care provider. Document Revised: 11/02/2019 Document Reviewed: 11/02/2019 Elsevier Patient Education  2021 Elsevier Inc. https://www.diabeteseducator.org/docs/default-source/living-with-diabetes/conquering-the-grocery-store-v1.pdf?sfvrsn=4">  Carbohydrate Counting for Diabetes Mellitus, Adult Carbohydrate counting is a method of keeping track of how many carbohydrates you eat. Eating carbohydrates naturally increases the amount of sugar (glucose) in the blood. Counting how many carbohydrates you eat improves your blood glucose control, which helps you manage your diabetes. It is important to know how many carbohydrates you can safely have in each meal. This is different for every person. A dietitian can help you make a meal plan and calculate how many carbohydrates you should have at each meal and snack. What foods contain carbohydrates? Carbohydrates are found in the following foods:  Grains, such as breads and cereals.  Dried beans and soy products.  Starchy vegetables, such as potatoes, peas, and corn.  Fruit and fruit juices.  Milk and yogurt.  Sweets and snack foods, such as cake, cookies, candy, chips, and soft drinks.   How do I count carbohydrates in foods? There are two ways to count carbohydrates in food. You can read food labels or learn standard serving sizes of foods. You can use either of the methods or a combination of both. Using the Nutrition Facts label The Nutrition Facts list is included on the labels of almost all packaged foods and beverages in the [place] It includes:  The serving size.  Information about nutrients in each serving, including the grams (g) of carbohydrate  per serving. To use the Nutrition Facts:  Decide how many servings you will have.  Multiply the number of servings by the number of carbohydrates per serving.  The resulting number is the total amount of carbohydrates that you will be having. Learning the standard serving sizes of foods When you eat carbohydrate foods that are not packaged or do not include Nutrition Facts on the label, you need to measure the servings in order to count the amount of carbohydrates.  Measure the foods that you will eat with a food scale or measuring cup, if needed.  Decide how many standard-size servings you will eat.  Multiply the number of servings by 15. For foods that contain carbohydrates, one serving equals 15 g of carbohydrates. ? For example, if you eat 2 cups or 10 oz (300 g) of strawberries, you will have eaten 2 servings and 30 g of carbohydrates (2 servings x 15 g = 30 g).  For foods that have more than one food mixed, such as soups and casseroles, you must count the carbohydrates in each food that is included. The following list contains standard serving sizes of common carbohydrate-rich foods. Each of these servings has about 15 g of carbohydrates:  1 slice of bread.  1 six-inch (15 cm) tortilla.  ? cup or 2   oz (53 g) cooked rice or pasta.   cup or 3 oz (85 g) cooked or canned, drained and rinsed beans or lentils.   cup or 3 oz (85 g) starchy vegetable, such as peas, corn, or squash.   cup or 4 oz (120 g) hot cereal.   cup or 3 oz (85 g) boiled or mashed potatoes, or  or 3 oz (85 g) of a large baked potato.   cup or 4 fl oz (118 mL) fruit juice.  1 cup or 8 fl oz (237 mL) milk.  1 small or 4 oz (106 g) apple.   or 2 oz (63 g) of a medium banana.  1 cup or 5 oz (150 g) strawberries.  3 cups or 1 oz (24 g) popped popcorn. What is an example of carbohydrate counting? To calculate the number of carbohydrates in this sample meal, follow the steps shown below. Sample  meal  3 oz (85 g) chicken breast.  ? cup or 4 oz (106 g) brown rice.   cup or 3 oz (85 g) corn.  1 cup or 8 fl oz (237 mL) milk.  1 cup or 5 oz (150 g) strawberries with sugar-free whipped topping. Carbohydrate calculation 1. Identify the foods that contain carbohydrates: ? Rice. ? Corn. ? Milk. ? Strawberries. 2. Calculate how many servings you have of each food: ? 2 servings rice. ? 1 serving corn. ? 1 serving milk. ? 1 serving strawberries. 3. Multiply each number of servings by 15 g: ? 2 servings rice x 15 g = 30 g. ? 1 serving corn x 15 g = 15 g. ? 1 serving milk x 15 g = 15 g. ? 1 serving strawberries x 15 g = 15 g. 4. Add together all of the amounts to find the total grams of carbohydrates eaten: ? 30 g + 15 g + 15 g + 15 g = 75 g of carbohydrates total. What are tips for following this plan? Shopping  Develop a meal plan and then make a shopping list.  Buy fresh and frozen vegetables, fresh and frozen fruit, dairy, eggs, beans, lentils, and whole grains.  Look at food labels. Choose foods that have more fiber and less sugar.  Avoid processed foods and foods with added sugars. Meal planning  Aim to have the same amount of carbohydrates at each meal and for each snack time.  Plan to have regular, balanced meals and snacks. Where to find more information  American Diabetes Association: www.diabetes.org  Centers for Disease Control and Prevention: www.cdc.gov Summary  Carbohydrate counting is a method of keeping track of how many carbohydrates you eat.  Eating carbohydrates naturally increases the amount of sugar (glucose) in the blood.  Counting how many carbohydrates you eat improves your blood glucose control, which helps you manage your diabetes.  A dietitian can help you make a meal plan and calculate how many carbohydrates you should have at each meal and snack. This information is not intended to replace advice given to you by your health care  provider. Make sure you discuss any questions you have with your health care provider. Document Revised: 11/29/2019 Document Reviewed: 11/30/2019 Elsevier Patient Education  2021 Elsevier Inc.  

## 2021-04-24 ENCOUNTER — Encounter: Payer: Self-pay | Admitting: Family

## 2021-04-24 ENCOUNTER — Telehealth: Payer: Self-pay

## 2021-04-24 ENCOUNTER — Telehealth: Payer: Self-pay | Admitting: Family

## 2021-04-24 ENCOUNTER — Other Ambulatory Visit: Payer: Self-pay | Admitting: Physician Assistant

## 2021-04-24 ENCOUNTER — Ambulatory Visit (INDEPENDENT_AMBULATORY_CARE_PROVIDER_SITE_OTHER): Payer: Self-pay | Admitting: Family

## 2021-04-24 DIAGNOSIS — Z89512 Acquired absence of left leg below knee: Secondary | ICD-10-CM

## 2021-04-24 MED ORDER — DOXYCYCLINE HYCLATE 100 MG PO TABS: 100.0000 mg | ORAL_TABLET | Freq: Two times a day (BID) | ORAL | 0 refills | Status: DC

## 2021-04-24 MED ORDER — OXYCODONE-ACETAMINOPHEN 5-325 MG PO TABS: 1.0000 | ORAL_TABLET | Freq: Three times a day (TID) | ORAL | 0 refills | Status: DC | PRN

## 2021-04-24 NOTE — Telephone Encounter (Signed)
See below can you help with this?

## 2021-04-24 NOTE — Telephone Encounter (Signed)
Called pharm. They did receive Rx and patient has picked up already.

## 2021-04-24 NOTE — Progress Notes (Signed)
Post-Op Visit Note   Patient: Justin Landry           Date of Birth: 04-01-1961           MRN: [id number] Visit Date: 04/24/2021 PCP: Rolm Gala, NP  Chief Complaint: No chief complaint on file.   HPI:  HPI The patient is a 60 year old gentleman who presents today for postoperative follow-up left below-knee amputation.  Has a few remaining staples.  Requesting refill of his pain medication.  Denies any systemic symptoms  Ortho Exam Remaining staples harvested today without incident.  The incision is healing well on the medial and lateral ends of the incision do have some gaping this is filled in with 100% fibrinous exudative tissue there is slight erythema surrounding laterally there is very mild warmth there is no ascending cellulitis no odor  Visit Diagnoses: No diagnosis found.  Plan: We will place on doxycycline.  He will follow-up in 2 weeks.  Discussed return precautions.  Did provide an order for his prosthesis set up  Follow-Up Instructions: Return in about 2 weeks (around 05/08/2021).   Imaging: No results found.  Orders:  No orders of the defined types were placed in this encounter.  Meds ordered this encounter  Medications  . DISCONTD: oxyCODONE-acetaminophen (PERCOCET) 5-325 MG tablet    Sig: Take 1 tablet by mouth every 8 (eight) hours as needed for severe pain.    Dispense:  20 tablet    Refill:  0  . oxyCODONE-acetaminophen (PERCOCET) 5-325 MG tablet    Sig: Take 1 tablet by mouth every 8 (eight) hours as needed for severe pain.    Dispense:  20 tablet    Refill:  0  . doxycycline (VIBRA-TABS) 100 MG tablet    Sig: Take 1 tablet (100 mg total) by mouth 2 (two) times daily.    Dispense:  28 tablet    Refill:  0     PMFS History: Patient Active Problem List   Diagnosis Date Noted  . Type 2 diabetes mellitus with other specified complication (HCC) 04/21/2021  . Acute osteomyelitis of left foot (HCC) 03/18/2021  . Cutaneous abscess  of left foot   . Dehiscence of amputation stump (HCC)   . Subacute osteomyelitis of left foot (HCC)   . Rupture of operation wound   . Subacute osteomyelitis, left ankle and foot (HCC)   . Overgrown toenails 10/30/2020  . Pre-operative clearance 09/16/2020  . Abnormal EKG 09/16/2020  . Chronic left shoulder pain 04/10/2020  . Toe pain 02/21/2020  . Anxiety 02/08/2020  . Left knee pain 02/07/2020  . Inguinodynia, right 11/13/2019  . Elevated lipids 10/04/2019  . 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 History of right inguinal hernia 08/14/2019  . Encounter to establish care 08/14/2019  . Anxiety 08/14/2019  . Right shoulder pain 08/14/2019  . Cellulitis 06/22/2019  . Essential hypertension 05/27/2015  . Type 2 diabetes mellitus without complication (HCC) 05/27/2015  . Diverticulosis of large intestine without hemorrhage 10/16/2014  . Recurrent right inguinal hernia 09/27/2014  . Tobacco use 08/01/2014  . Hematuria, gross 07/03/2014  . Lower urinary tract symptoms (LUTS) 07/03/2014  . Diabetes mellitus (HCC) 06/25/2014  . Inguinal hernia, bilateral  04/10/2014   Past Medical History:  Diagnosis Date  . Arthritis    right shoulder  . Depression   . Diabetes mellitus    Type II  . Headache  migraines  . High cholesterol   . Humerus fracture    right  . Hypertension   . Inguinal hernia   . Neuromuscular disorder (HCC)    neuropathy    Family History  Problem Relation Age of Onset  . Diabetes Mother   . Hypertension Mother   . Diabetes Father   . Hypertension Father   . Cancer Brother        brain    Past Surgical History:  Procedure Laterality Date  . AMPUTATION Left 12/10/2020   Procedure: LEFT GREAT TOE AMPUTATION;  Surgeon: [name], MD;  Location: [facility] OR;  Service: Orthopedics;  Laterality: Left;  . AMPUTATION Left 01/21/2021   Procedure: 1ST AND 2ND RAY AMPUTATION LEFT FOOT;   Surgeon: [name], MD;  Location: [facility] OR;  Service: Orthopedics;  Laterality: Left;  . AMPUTATION Left 03/18/2021   Procedure: LEFT BELOW KNEE AMPUTATION;  Surgeon: [name], MD;  Location[facility] OR;  Service: Orthopedics;  Laterality: Left;  . AMPUTATION TOE Left 06/23/2019   Procedure: AMPUTATION TOE LEFT AMPUTATION;  Surgeon: Linus Galas, DPM;  Location: [facility] ORS;  Service: Podiatry;  Laterality: Left;  . AMPUTATION TOE Left 08/31/2019   Procedure: AMPUTATION TOE  IPJ [id number];  Surgeon: Linus Galas, DPM;  Location: [facility] ORS;  Service: Podiatry;  Laterality: Left;  . CYSTOSCOPY  2015   Biopsy  . HERNIA REPAIR Right 2008   [facility]  . REVERSE SHOULDER ARTHROPLASTY Right 09/18/2020   Procedure: RIGHT REVERSE SHOULDER ARTHROPLASTY;  Surgeon: Cammy Copa, MD;  Location: [facility] OR;  Service: Orthopedics;  Laterality: Right;   Social History   Occupational History  . Occupation: unemployed  Tobacco Use  . Smoking status: Current Every Day Smoker    Packs/day: 0.25    Years: 43.00    Pack years: 10.75    Types: Cigarettes  . Smokeless tobacco: Never Used  . Tobacco comment: cut down to quarter pack a day, aware of resources  Vaping Use  . Vaping Use: Never used  Substance and Sexual Activity  . Alcohol use: Not Currently    Alcohol/week: 2.0 standard drinks    Types: 2 Cans of beer per week    Comment: occasional beer   . Drug use: Yes    Frequency: 7.0 times per week    Types: Marijuana    Comment: for neuropathy - last use 03/08/21  . Sexual activity: Yes

## 2021-04-24 NOTE — Telephone Encounter (Signed)
Can you please send in His oxycodone Prescribed By Denny Peon into the [employer]. Looks like Rx was printed by accident. Thanks.

## 2021-04-24 NOTE — Telephone Encounter (Signed)
IC advised this was done.  

## 2021-04-24 NOTE — Telephone Encounter (Signed)
sent 

## 2021-04-24 NOTE — Telephone Encounter (Signed)
Gardiner Ramus with [facility] pharmacy called stating the percocent rx didn't come through and she would like it to be tried again; she states she needs it to be sent electronically, it can't be done via fax. Gardiner Ramus also asked we notify the pharmacy once it's resent.   Pharm# [phone]

## 2021-04-24 NOTE — Telephone Encounter (Signed)
Pt wants to know when are you going to send in the pain medication and antibiotics in?

## 2021-04-29 ENCOUNTER — Telehealth: Payer: Self-pay | Admitting: Physician Assistant

## 2021-04-29 ENCOUNTER — Other Ambulatory Visit: Payer: Self-pay | Admitting: Physician Assistant

## 2021-04-29 NOTE — Telephone Encounter (Signed)
Oxycodone 5/325  mg #20 04/20/21 Pt requesting refill. Also received refill of Oxycodone 5 mg  last refill 04/14/21, 04/06/21 and 03/30/21 #30 please advise.

## 2021-04-29 NOTE — Telephone Encounter (Signed)
Pt called asking to have a rx for pain called into his [facility] in [place] and he would like to be notified when this is done please.  [phone]

## 2021-04-29 NOTE — Telephone Encounter (Signed)
Cant refill until 5/23

## 2021-04-29 NOTE — Telephone Encounter (Signed)
Lm on vm to advise of below. Will hold and fill on Monday.

## 2021-05-01 ENCOUNTER — Other Ambulatory Visit: Payer: Self-pay

## 2021-05-04 ENCOUNTER — Telehealth: Payer: Self-pay | Admitting: Physician Assistant

## 2021-05-04 ENCOUNTER — Other Ambulatory Visit: Payer: Self-pay | Admitting: Physician Assistant

## 2021-05-04 ENCOUNTER — Ambulatory Visit (INDEPENDENT_AMBULATORY_CARE_PROVIDER_SITE_OTHER): Payer: Self-pay | Admitting: Physician Assistant

## 2021-05-04 ENCOUNTER — Encounter: Payer: Self-pay | Admitting: Physician Assistant

## 2021-05-04 DIAGNOSIS — Z89512 Acquired absence of left leg below knee: Secondary | ICD-10-CM

## 2021-05-04 MED ORDER — OXYCODONE-ACETAMINOPHEN 5-325 MG PO TABS: 1.0000 | ORAL_TABLET | Freq: Four times a day (QID) | ORAL | 0 refills | Status: DC | PRN

## 2021-05-04 MED ORDER — METHOCARBAMOL 500 MG PO TABS: 500.0000 mg | ORAL_TABLET | Freq: Four times a day (QID) | ORAL | 0 refills | Status: DC | PRN

## 2021-05-04 MED ORDER — OXYCODONE-ACETAMINOPHEN 5-325 MG PO TABS: 1.0000 | ORAL_TABLET | Freq: Three times a day (TID) | ORAL | 0 refills | Status: DC | PRN

## 2021-05-04 NOTE — Telephone Encounter (Signed)
Patient called advised he fell yesterday and hit hs left stump. Patient said he is in a lot of pain. Patient asked if a Rx can be called in for Oxycodone 5-325?  The number to contact patient is [phone]

## 2021-05-04 NOTE — Telephone Encounter (Signed)
Can you please call pt and see if we can move his appt up to today or tomorrow? He is a BKA and if he hit his limb from a fall we need to see in office.

## 2021-05-04 NOTE — Progress Notes (Signed)
Office Visit Note   Patient: Justin Landry           Date of Birth: Apr 19, 1961           MRN: [id number] Visit Date: 05/04/2021              Requested by: Rolm Gala, NP [address] [place],  [place] [address] PCP: Rolm Gala, NP  Chief Complaint  Patient presents with  . Left Leg - Follow-up      HPI: Patient is status post left below-knee amputation.  He was doing very well but fell onto the stump.  This happened over the weekend.  He has significant increase in pain at the distal end of his stump.  Assessment & Plan: Visit Diagnoses: No diagnosis found.  Plan: He will continue to wear shrinker emphasize importance of elevating his stump above heart level if he can.  We will follow-up in 2 weeks but should keep his appointment on Friday in case he still has significant pain.  Follow-Up Instructions: No follow-ups on file.   Ortho Exam  Patient is alert, oriented, no adenopathy, well-dressed, normal affect, normal respiratory effort. Patient has moderate soft tissue swelling at his stump but no ecchymosis no erythema no ascending cellulitis.  Incision remains well opposed with just small areas of eschar and just some skin maceration.  No sign of deep dehiscence  Imaging: No results found. No images are attached to the encounter.  Labs: Lab Results  Component Value Date   HGBA1C 7.5 (H) 03/18/2021   HGBA1C 7.5 (H) 09/24/2020   HGBA1C 7.2 (H) 09/10/2020   REPTSTATUS 09/11/2020 FINAL 09/10/2020   GRAMSTAIN  06/23/2019    FEW WBC PRESENT, PREDOMINANTLY PMN RARE GRAM POSITIVE COCCI    CULT (A) 09/10/2020    <10,000 COLONIES/mL INSIGNIFICANT GROWTH Performed at [facility] Lab, [address] [place], [place] [address]    LABORGA LECLERCIA ADECARBOXYLATA 06/23/2019   LABORGA STAPHYLOCOCCUS CAPRAE 06/23/2019   LABORGA STAPHYLOCOCCUS SIMULANS 06/23/2019     Lab Results  Component Value Date   ALBUMIN 4.2 09/10/2020    ALBUMIN 4.5 07/14/2020   ALBUMIN 4.5 06/25/2020    No results found for: MG No results found for: VD25OH  No results found for: PREALBUMIN CBC EXTENDED Latest Ref Rng & Units 03/20/2021 03/19/2021 03/15/2021  WBC 4.0 - 10.5 K/uL 12.1(H) 14.3(H) 12.9(H)  RBC 4.22 - 5.81 MIL/uL 4.56 4.27 4.50  HGB 13.0 - 17.0 g/dL 26.7 12.7(L) 13.8  HCT 39.0 - 52.0 % 38.9(L) 37.5(L) 39.8  PLT 150 - 400 K/uL 395 392 430(H)  NEUTROABS 1.7 - 7.7 K/uL - - 8.9(H)  LYMPHSABS 0.7 - 4.0 K/uL - - 2.5     There is no height or weight on file to calculate BMI.  Orders:  No orders of the defined types were placed in this encounter.  Meds ordered this encounter  Medications  . methocarbamol (ROBAXIN) 500 MG tablet    Sig: Take 1 tablet (500 mg total) by mouth every 6 (six) hours as needed for muscle spasms.    Dispense:  30 tablet    Refill:  0  . oxyCODONE-acetaminophen (PERCOCET/ROXICET) 5-325 MG tablet    Sig: Take 1 tablet by mouth every 6 (six) hours as needed for severe pain.    Dispense:  30 tablet    Refill:  0     Procedures: No procedures performed  Clinical Data: No additional findings.  ROS:  All other  systems negative, except as noted in the HPI. Review of Systems  Objective: Vital Signs: There were no vitals taken for this visit.  Specialty Comments:  No specialty comments available.  PMFS History: Patient Active Problem List   Diagnosis Date Noted  . Type 2 diabetes mellitus with other specified complication (HCC) 04/21/2021  . Acute osteomyelitis of left foot (HCC) 03/18/2021  . Cutaneous abscess of left foot   . Dehiscence of amputation stump (HCC)   . Subacute osteomyelitis of left foot (HCC)   . Rupture of operation wound   . Subacute osteomyelitis, left ankle and foot (HCC)   . Overgrown toenails 10/30/2020  . Pre-operative clearance 09/16/2020  . Abnormal EKG 09/16/2020  . Chronic left shoulder pain 04/10/2020  . Toe pain 02/21/2020  . Anxiety 02/08/2020  . Left  knee pain 02/07/2020  . Inguinodynia, right 11/13/2019  . Elevated lipids 10/04/2019  . 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 History of right inguinal hernia 08/14/2019  . Encounter to establish care 08/14/2019  . Anxiety 08/14/2019  . Right shoulder pain 08/14/2019  . Cellulitis 06/22/2019  . Essential hypertension 05/27/2015  . Type 2 diabetes mellitus without complication (HCC) 05/27/2015  . Diverticulosis of large intestine without hemorrhage 10/16/2014  . Recurrent right inguinal hernia 09/27/2014  . Tobacco use 08/01/2014  . Hematuria, gross 07/03/2014  . Lower urinary tract symptoms (LUTS) 07/03/2014  . Diabetes mellitus (HCC) 06/25/2014  . Inguinal hernia, bilateral  04/10/2014   Past Medical History:  Diagnosis Date  . Arthritis    right shoulder  . Depression   . Diabetes mellitus    Type II  . Headache    migraines  . High cholesterol   . Humerus fracture    right  . Hypertension   . Inguinal hernia   . Neuromuscular disorder (HCC)    neuropathy    Family History  Problem Relation Age of Onset  . Diabetes Mother   . Hypertension Mother   . Diabetes Father   . Hypertension Father   . Cancer Brother        brain    Past Surgical History:  Procedure Laterality Date  . AMPUTATION Left 12/10/2020   Procedure: LEFT GREAT TOE AMPUTATION;  Surgeon: [name], MD;  Location: [facility] OR;  Service: Orthopedics;  Laterality: Left;  . AMPUTATION Left 01/21/2021   Procedure: 1ST AND 2ND RAY AMPUTATION LEFT FOOT;  Surgeon: [name], MD;  Location: [facility] OR;  Service: Orthopedics;  Laterality: Left;  . AMPUTATION Left 03/18/2021   Procedure: LEFT BELOW KNEE AMPUTATION;  Surgeon: [name], MD;  Location: [facility] OR;  Service: Orthopedics;  Laterality: Left;  . AMPUTATION TOE Left 06/23/2019   Procedure: AMPUTATION TOE LEFT AMPUTATION;  Surgeon: Linus Galas, DPM;  Location: [facility] ORS;   Service: Podiatry;  Laterality: Left;  . AMPUTATION TOE Left 08/31/2019   Procedure: AMPUTATION TOE  IPJ [id number];  Surgeon: Linus Galas, DPM;  Location: [facility] ORS;  Service: Podiatry;  Laterality: Left;  . CYSTOSCOPY  2015   Biopsy  . HERNIA REPAIR Right 2008   [facility]  . REVERSE SHOULDER ARTHROPLASTY Right 09/18/2020   Procedure: RIGHT REVERSE SHOULDER ARTHROPLASTY;  Surgeon: Cammy Copa, MD;  Location: [facility] OR;  Service: Orthopedics;  Laterality: Right;   Social History   Occupational History  . Occupation: unemployed  Tobacco Use  . Smoking status: Current Every Day Smoker  Packs/day: 0.25    Years: 43.00    Pack years: 10.75    Types: Cigarettes  . Smokeless tobacco: Never Used  . Tobacco comment: cut down to quarter pack a day, aware of resources  Vaping Use  . Vaping Use: Never used  Substance and Sexual Activity  . Alcohol use: Not Currently    Alcohol/week: 2.0 standard drinks    Types: 2 Cans of beer per week    Comment: occasional beer   . Drug use: Yes    Frequency: 7.0 times per week    Types: Marijuana    Comment: for neuropathy - last use 03/08/21  . Sexual activity: Yes

## 2021-05-04 NOTE — Telephone Encounter (Signed)
Will come in today at 2:15

## 2021-05-04 NOTE — Telephone Encounter (Signed)
FYI Called patient advised him that he needed to reschedule his appointment for today or tomorrow due to hitting his stump. Patient said he will see if he can get someone to bring him today or tomorrow and call us back.

## 2021-05-04 NOTE — Telephone Encounter (Signed)
Done

## 2021-05-04 NOTE — Telephone Encounter (Signed)
Pt would like refill for Oxycodone. He is due today for refill.

## 2021-05-04 NOTE — Telephone Encounter (Signed)
Pt called because they have contacted the pharmacy and pharmacy stated they have no received a refill for the [facility] prescription. Pt would like a call when it has been filled if possible. The call back number is [phone] if there are any questions.

## 2021-05-08 ENCOUNTER — Encounter: Payer: Self-pay | Admitting: Physician Assistant

## 2021-05-12 ENCOUNTER — Telehealth: Payer: Self-pay | Admitting: Orthopedic Surgery

## 2021-05-12 ENCOUNTER — Other Ambulatory Visit: Payer: Self-pay | Admitting: Physician Assistant

## 2021-05-12 MED ORDER — OXYCODONE-ACETAMINOPHEN 5-325 MG PO TABS: 1.0000 | ORAL_TABLET | Freq: Three times a day (TID) | ORAL | 0 refills | Status: DC | PRN

## 2021-05-12 NOTE — Telephone Encounter (Signed)
Patient called. He would like a refill on oxycodone. His call back number is [phone]

## 2021-05-12 NOTE — Telephone Encounter (Signed)
FYI Pt called back regarding the status of the refill

## 2021-05-12 NOTE — Telephone Encounter (Signed)
Refilled but changed to every 8 hours so rx will not be refilled for at least 10 days

## 2021-05-12 NOTE — Telephone Encounter (Signed)
Please see message below. Last refill was 5/32/22 #30

## 2021-05-13 ENCOUNTER — Telehealth: Payer: Self-pay | Admitting: Pharmacist

## 2021-05-13 NOTE — Telephone Encounter (Signed)
Patient approved for medication assistance at [facility] until 02/10/22, as long as eligibility criteria continues to be met.   [facility] Administrative Assistant 

## 2021-05-18 ENCOUNTER — Encounter: Payer: Self-pay | Admitting: Orthopedic Surgery

## 2021-05-18 ENCOUNTER — Other Ambulatory Visit: Payer: Self-pay

## 2021-05-18 ENCOUNTER — Ambulatory Visit (INDEPENDENT_AMBULATORY_CARE_PROVIDER_SITE_OTHER): Payer: Self-pay | Admitting: Physician Assistant

## 2021-05-18 DIAGNOSIS — Z89512 Acquired absence of left leg below knee: Secondary | ICD-10-CM

## 2021-05-18 NOTE — Progress Notes (Signed)
Office Visit Note   Patient: Justin Landry           Date of Birth: 1961-04-15           MRN: [id number] Visit Date: 05/18/2021              Requested by: Rolm Gala, NP [address] [place],  [place] [address] PCP: Rolm Gala, NP  Chief Complaint  Patient presents with  . Left Leg - Routine Post Op    03/18/21 left BKA       HPI: Patient presents in follow-up he is 2 months status post left below-knee amputation.  Overall he is feeling well.  He has been trying to get an appointment with [facility] to begin having them give him shrinkers his only complaint is on the medial side of the incision there is some scabbing and he is concerned that there might be some infection.  Assessment & Plan: Visit Diagnoses: No diagnosis found.  Plan: We will follow-up in 3 weeks should follow-up with [facility] as well.  Follow-Up Instructions: No follow-ups on file.   Ortho Exam  Patient is alert, oriented, no adenopathy, well-dressed, normal affect, normal respiratory effort. Below-knee amputation stump moderate soft tissue swelling but no ascending cellulitis at this.  He has some scabbing.  1 area of scabbing on the far medial end of the incision.  This was debrided and beneath that there is no dehiscence and healthy tissue.  It was touched slightly with a silver nitrate stick no signs of infection  Imaging: No results found. No images are attached to the encounter.  Labs: Lab Results  Component Value Date   HGBA1C 7.5 (H) 03/18/2021   HGBA1C 7.5 (H) 09/24/2020   HGBA1C 7.2 (H) 09/10/2020   REPTSTATUS 09/11/2020 FINAL 09/10/2020   GRAMSTAIN  06/23/2019    FEW WBC PRESENT, PREDOMINANTLY PMN RARE GRAM POSITIVE COCCI    CULT (A) 09/10/2020    <10,000 COLONIES/mL INSIGNIFICANT GROWTH Performed at [facility], [address] [place], [place] [address]    LABORGA LECLERCIA ADECARBOXYLATA 06/23/2019   LABORGA STAPHYLOCOCCUS CAPRAE 06/23/2019    LABORGA STAPHYLOCOCCUS SIMULANS 06/23/2019     Lab Results  Component Value Date   ALBUMIN 4.2 09/10/2020   ALBUMIN 4.5 07/14/2020   ALBUMIN 4.5 06/25/2020    No results found for: MG No results found for: VD25OH  No results found for: PREALBUMIN CBC EXTENDED Latest Ref Rng & Units 03/20/2021 03/19/2021 03/15/2021  WBC 4.0 - 10.5 K/uL 12.1(H) 14.3(H) 12.9(H)  RBC 4.22 - 5.81 MIL/uL 4.56 4.27 4.50  HGB 13.0 - 17.0 g/dL 14.9 12.7(L) 13.8  HCT 39.0 - 52.0 % 38.9(L) 37.5(L) 39.8  PLT 150 - 400 K/uL 395 392 430(H)  NEUTROABS 1.7 - 7.7 K/uL - - 8.9(H)  LYMPHSABS 0.7 - 4.0 K/uL - - 2.5     There is no height or weight on file to calculate BMI.  Orders:  No orders of the defined types were placed in this encounter.  No orders of the defined types were placed in this encounter.    Procedures: No procedures performed  Clinical Data: No additional findings.  ROS:  All other systems negative, except as noted in the HPI. Review of Systems  Objective: Vital Signs: There were no vitals taken for this visit.  Specialty Comments:  No specialty comments available.  PMFS History: Patient Active Problem List   Diagnosis Date Noted  . Type 2 diabetes mellitus  with other specified complication (HCC) 04/21/2021  . Acute osteomyelitis of left foot (HCC) 03/18/2021  . Cutaneous abscess of left foot   . Dehiscence of amputation stump (HCC)   . Subacute osteomyelitis of left foot (HCC)   . Rupture of operation wound   . Subacute osteomyelitis, left ankle and foot (HCC)   . Overgrown toenails 10/30/2020  . Pre-operative clearance 09/16/2020  . Abnormal EKG 09/16/2020  . Chronic left shoulder pain 04/10/2020  . Toe pain 02/21/2020  . Anxiety 02/08/2020  . Left knee pain 02/07/2020  . Inguinodynia, right 11/13/2019  . Elevated lipids 10/04/2019  . 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 History of right  inguinal hernia 08/14/2019  . Encounter to establish care 08/14/2019  . Anxiety 08/14/2019  . Right shoulder pain 08/14/2019  . Cellulitis 06/22/2019  . Essential hypertension 05/27/2015  . Type 2 diabetes mellitus without complication (HCC) 05/27/2015  . Diverticulosis of large intestine without hemorrhage 10/16/2014  . Recurrent right inguinal hernia 09/27/2014  . Tobacco use 08/01/2014  . Hematuria, gross 07/03/2014  . Lower urinary tract symptoms (LUTS) 07/03/2014  . Diabetes mellitus (HCC) 06/25/2014  . Inguinal hernia, bilateral  04/10/2014   Past Medical History:  Diagnosis Date  . Arthritis    right shoulder  . Depression   . Diabetes mellitus    Type II  . Headache    migraines  . High cholesterol   . Humerus fracture    right  . Hypertension   . Inguinal hernia   . Neuromuscular disorder (HCC)    neuropathy    Family History  Problem Relation Age of Onset  . Diabetes Mother   . Hypertension Mother   . Diabetes Father   . Hypertension Father   . Cancer Brother        brain    Past Surgical History:  Procedure Laterality Date  . AMPUTATION Left 12/10/2020   Procedure: LEFT GREAT TOE AMPUTATION;  Surgeon: [name], MD;  Location: [facility] OR;  Service: Orthopedics;  Laterality: Left;  . AMPUTATION Left 01/21/2021   Procedure: 1ST AND 2ND RAY AMPUTATION LEFT FOOT;  Surgeon: [name], MD;  Location: [facility] OR;  Service: Orthopedics;  Laterality: Left;  . AMPUTATION Left 03/18/2021   Procedure: LEFT BELOW KNEE AMPUTATION;  Surgeon: [name], MD;  Location: [facility] OR;  Service: Orthopedics;  Laterality: Left;  . AMPUTATION TOE Left 06/23/2019   Procedure: AMPUTATION TOE LEFT AMPUTATION;  Surgeon: Linus Galas, DPM;  Location: [facility] ORS;  Service: Podiatry;  Laterality: Left;  . AMPUTATION TOE Left 08/31/2019   Procedure: AMPUTATION TOE  IPJ [id number];  Surgeon: Linus Galas, DPM;  Location: [facility] ORS;  Service: Podiatry;  Laterality: Left;  . CYSTOSCOPY  2015   Biopsy   . HERNIA REPAIR Right 2008   [facility]  . REVERSE SHOULDER ARTHROPLASTY Right 09/18/2020   Procedure: RIGHT REVERSE SHOULDER ARTHROPLASTY;  Surgeon: Cammy Copa, MD;  Location: [facility] OR;  Service: Orthopedics;  Laterality: Right;   Social History   Occupational History  . Occupation: unemployed  Tobacco Use  . Smoking status: Current Every Day Smoker    Packs/day: 0.25    Years: 43.00    Pack years: 10.75    Types: Cigarettes  . Smokeless tobacco: Never Used  . Tobacco comment: cut down to quarter pack a day, aware of resources  Vaping Use  . Vaping Use: Never  used  Substance and Sexual Activity  . Alcohol use: Not Currently    Alcohol/week: 2.0 standard drinks    Types: 2 Cans of beer per week    Comment: occasional beer   . Drug use: Yes    Frequency: 7.0 times per week    Types: Marijuana    Comment: for neuropathy - last use 03/08/21  . Sexual activity: Yes

## 2021-05-21 ENCOUNTER — Telehealth: Payer: Self-pay | Admitting: Orthopedic Surgery

## 2021-05-21 ENCOUNTER — Other Ambulatory Visit: Payer: Self-pay | Admitting: Physician Assistant

## 2021-05-21 MED ORDER — OXYCODONE-ACETAMINOPHEN 5-325 MG PO TABS: 1.0000 | ORAL_TABLET | Freq: Three times a day (TID) | ORAL | 0 refills | Status: DC | PRN

## 2021-05-21 NOTE — Telephone Encounter (Signed)
I called pt and advised of message below. Advised that per the last visit his concern about not being able to see [facility] until he has insurance is not an issue. [facility] advised that he can come in and get his shrinkers and that they will bill everything to his insurance once that has been settled. To call with any questions or concerns.

## 2021-05-21 NOTE — Telephone Encounter (Signed)
Pt called asking for a refill of his oxycodone 5-325 mg rx and he would like a CB once that's been sent to the [facility] in [place].  [phone]

## 2021-05-21 NOTE — Telephone Encounter (Signed)
Please let patient know this is last refill 

## 2021-05-21 NOTE — Telephone Encounter (Signed)
Pt requesting refill of Oxycodoen 5/325 last refill was 05/12/21 #30, 05/04/21 # 30, 04/20/21 #30 please advise.

## 2021-05-22 ENCOUNTER — Other Ambulatory Visit: Payer: Self-pay

## 2021-05-25 ENCOUNTER — Other Ambulatory Visit: Payer: Self-pay

## 2021-05-28 ENCOUNTER — Other Ambulatory Visit: Payer: Self-pay

## 2021-05-29 ENCOUNTER — Telehealth: Payer: Self-pay | Admitting: Physician Assistant

## 2021-05-29 ENCOUNTER — Other Ambulatory Visit: Payer: Self-pay

## 2021-05-29 ENCOUNTER — Encounter: Payer: Self-pay | Admitting: Physician Assistant

## 2021-05-29 ENCOUNTER — Ambulatory Visit (INDEPENDENT_AMBULATORY_CARE_PROVIDER_SITE_OTHER): Payer: Self-pay | Admitting: Physician Assistant

## 2021-05-29 DIAGNOSIS — B351 Tinea unguium: Secondary | ICD-10-CM

## 2021-05-29 NOTE — Progress Notes (Signed)
Office Visit Note   Patient: Justin Landry           Date of Birth: 1961/10/03           MRN: [id number] Visit Date: 05/29/2021              Requested by: Rolm Gala, NP [address] [place],  [place] [address] PCP: Rolm Gala, NP  Chief Complaint  Patient presents with   Left Knee - Follow-up      HPI: Patient presents today with concerns for a toenail on his right great toe.  He is status post left below-knee amputation.  He noticed that his right great toenail was growing into his toe and across to his next toe.  He tried to cut it himself.  He just wants to be sure there is no infection.  Assessment & Plan: Visit Diagnoses: No diagnosis found.  Plan: Onychomycotic nails x3.  Other nails were stable.  I told the patient he should allow [place] to trim this nail and not attempt to trim it himself.  He will follow-up with a recheck of his BKA which is doing quite well at the end of the month  Follow-Up Instructions: No follow-ups on file.   Ortho Exam  Patient is alert, oriented, no adenopathy, well-dressed, normal affect, normal respiratory effort. Examination of his right great toenail there is toes warm and pink there is no sign of infection he has trimmed back part of the nail.  There is no cellulitis no swelling.  After obtaining verbal consent the nail was further trimmed away from the skin.  Imaging: No results found. No images are attached to the encounter.  Labs: Lab Results  Component Value Date   HGBA1C 7.5 (H) 03/18/2021   HGBA1C 7.5 (H) 09/24/2020   HGBA1C 7.2 (H) 09/10/2020   REPTSTATUS 09/11/2020 FINAL 09/10/2020   GRAMSTAIN  06/23/2019    FEW WBC PRESENT, PREDOMINANTLY PMN RARE GRAM POSITIVE COCCI    CULT (A) 09/10/2020    <10,000 COLONIES/mL INSIGNIFICANT GROWTH Performed at [facility], [address], [place], [place] [address]    LABORGA LECLERCIA ADECARBOXYLATA 06/23/2019   LABORGA STAPHYLOCOCCUS CAPRAE  06/23/2019   LABORGA STAPHYLOCOCCUS SIMULANS 06/23/2019     Lab Results  Component Value Date   ALBUMIN 4.2 09/10/2020   ALBUMIN 4.5 07/14/2020   ALBUMIN 4.5 06/25/2020    No results found for: MG No results found for: VD25OH  No results found for: PREALBUMIN CBC EXTENDED Latest Ref Rng & Units 03/20/2021 03/19/2021 03/15/2021  WBC 4.0 - 10.5 K/uL 12.1(H) 14.3(H) 12.9(H)  RBC 4.22 - 5.81 MIL/uL 4.56 4.27 4.50  HGB 13.0 - 17.0 g/dL 15.1 12.7(L) 13.8  HCT 39.0 - 52.0 % 38.9(L) 37.5(L) 39.8  PLT 150 - 400 K/uL 395 392 430(H)  NEUTROABS 1.7 - 7.7 K/uL - - 8.9(H)  LYMPHSABS 0.7 - 4.0 K/uL - - 2.5     There is no height or weight on file to calculate BMI.  Orders:  No orders of the defined types were placed in this encounter.  No orders of the defined types were placed in this encounter.    Procedures: No procedures performed  Clinical Data: No additional findings.  ROS:  All other systems negative, except as noted in the HPI. Review of Systems  Objective: Vital Signs: There were no vitals taken for this visit.  Specialty Comments:  No specialty comments available.  PMFS History: Patient Active Problem  List   Diagnosis Date Noted   Type 2 diabetes mellitus with other specified complication (HCC) 04/21/2021   Acute osteomyelitis of left foot (HCC) 03/18/2021   Cutaneous abscess of left foot    Dehiscence of amputation stump (HCC)    Subacute osteomyelitis of left foot (HCC)    Rupture of operation wound    Subacute osteomyelitis, left ankle and foot (HCC)    Overgrown toenails 10/30/2020   Pre-operative clearance 09/16/2020   Abnormal EKG 09/16/2020   Chronic left shoulder pain 04/10/2020   Toe pain 02/21/2020   Anxiety 02/08/2020   Left knee pain 02/07/2020   Inguinodynia, right 11/13/2019   Elevated lipids 10/04/2019   Diabetic ulcer of toe of left foot associated with diabetes mellitus due to underlying condition (HCC) 08/23/2019   History of right  inguinal hernia 08/14/2019   Encounter to establish care 08/14/2019   Anxiety 08/14/2019   Right shoulder pain 08/14/2019   Cellulitis 06/22/2019   Essential hypertension 05/27/2015   Type 2 diabetes mellitus without complication (HCC) 05/27/2015   Diverticulosis of large intestine without hemorrhage 10/16/2014   Recurrent right inguinal hernia 09/27/2014   Tobacco use 08/01/2014   Hematuria, gross 07/03/2014   Lower urinary tract symptoms (LUTS) 07/03/2014   Diabetes mellitus (HCC) 06/25/2014   Inguinal hernia, bilateral  04/10/2014   Past Medical History:  Diagnosis Date   Arthritis    right shoulder   Depression    Diabetes mellitus    Type II   Headache    migraines   High cholesterol    Humerus fracture    right   Hypertension    Inguinal hernia    Neuromuscular disorder (HCC)    neuropathy    Family History  Problem Relation Age of Onset   Diabetes Mother    Hypertension Mother    Diabetes Father    Hypertension Father    Cancer Brother        brain    Past Surgical History:  Procedure Laterality Date   AMPUTATION Left 12/10/2020   Procedure: LEFT GREAT TOE AMPUTATION;  Surgeon: [name], MD;  Location: [facility] OR;  Service: Orthopedics;  Laterality: Left;   AMPUTATION Left 01/21/2021   Procedure: 1ST AND 2ND RAY AMPUTATION LEFT FOOT;  Surgeon: [name], MD;  Location: [facility] OR;  Service: Orthopedics;  Laterality: Left;   AMPUTATION Left 03/18/2021   Procedure: LEFT BELOW KNEE AMPUTATION;  Surgeon: [name], MD;  Location: [facility] OR;  Service: Orthopedics;  Laterality: Left;   AMPUTATION TOE Left 06/23/2019   Procedure: AMPUTATION TOE LEFT AMPUTATION;  Surgeon: Linus Galas, DPM;  Location: [facility] ORS;  Service: Podiatry;  Laterality: Left;   AMPUTATION TOE Left 08/31/2019   Procedure: AMPUTATION TOE  IPJ [id number];  Surgeon: Linus Galas, DPM;  Location: [facility] ORS;  Service: Podiatry;  Laterality: Left;   CYSTOSCOPY  2015   Biopsy   HERNIA REPAIR Right 2008    [facility]   REVERSE SHOULDER ARTHROPLASTY Right 09/18/2020   Procedure: RIGHT REVERSE SHOULDER ARTHROPLASTY;  Surgeon: Cammy Copa, MD;  Location: [facility] OR;  Service: Orthopedics;  Laterality: Right;   Social History   Occupational History   Occupation: unemployed  Tobacco Use   Smoking status: Every Day    Packs/day: 0.25    Years: 43.00    Pack years: 10.75    Types: Cigarettes   Smokeless tobacco: Never   Tobacco comments:    cut down to quarter pack a  day, aware of resources  Vaping Use   Vaping Use: Never used  Substance and Sexual Activity   Alcohol use: Not Currently    Alcohol/week: 2.0 standard drinks    Types: 2 Cans of beer per week    Comment: occasional beer    Drug use: Yes    Frequency: 7.0 times per week    Types: Marijuana    Comment: for neuropathy - last use 03/08/21   Sexual activity: Yes

## 2021-05-29 NOTE — Telephone Encounter (Signed)
Pt called that he is worried about his right toe. He wants to be worked in with [facility] today if possible.   CB [phone]

## 2021-05-29 NOTE — Telephone Encounter (Signed)
Can you please call this pt and make an appt for today?

## 2021-06-08 ENCOUNTER — Ambulatory Visit (INDEPENDENT_AMBULATORY_CARE_PROVIDER_SITE_OTHER): Payer: Self-pay

## 2021-06-08 ENCOUNTER — Ambulatory Visit (INDEPENDENT_AMBULATORY_CARE_PROVIDER_SITE_OTHER): Payer: Self-pay | Admitting: Physician Assistant

## 2021-06-08 ENCOUNTER — Encounter: Payer: Self-pay | Admitting: Orthopedic Surgery

## 2021-06-08 DIAGNOSIS — G8929 Other chronic pain: Secondary | ICD-10-CM

## 2021-06-08 DIAGNOSIS — M25562 Pain in left knee: Secondary | ICD-10-CM

## 2021-06-08 NOTE — Progress Notes (Signed)
Office Visit Note   Patient: Justin Landry           Date of Birth: 02-12-1961           MRN: [id number] Visit Date: 06/08/2021              Requested by: Rolm Gala, NP 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  He is wearing one of the [employer] shrinkers.  He expects to be fitted for his prosthetic in about 5 weeks.  His only complaint is of tenderness over his patellar tendon.  He does keep his leg extended for long periods of time with engaging the muscle.  It has become very tender to light touch.  It feels throbbing to him.  He denies any injuries.  Assessment & Plan: Visit Diagnoses:  1. Chronic pain of left knee     Plan: Findings most consistent with irritation of the patellar tendon.  Have talked to him about using some Voltaren gel.  Should try desensitization in this area as well.  Also talked to him about not having the sock play directly in this area at the top either pull it up further more folded down slightly less follow-up in 4 weeks or sooner if any concerns  Follow-Up Instructions: No follow-ups on file.   Ortho Exam  Patient is alert, oriented, no adenopathy, well-dressed, normal affect, normal respiratory effort. Left lower extremity no erythema no redness incision is actually healing very well over the amputation stump.  He has no effusion in his knee no evidence of any infective process.  He is directly tender over the patellar tendon he is able to fire the patella tendon without difficulty and without much pain.  Imaging: XR Knee 1-2 Views Left  Result Date: 06/08/2021 2 views of his knee do not show any changes since previous x-rays no acute osseous changes minimal degenerative changes.  No abnormalities in the area of question  at the patellar tendon insertions no sign of an infective process  No images are attached to the encounter.  Labs: Lab Results  Component Value Date   HGBA1C 7.5 (H) 03/18/2021   HGBA1C 7.5 (H) 09/24/2020   HGBA1C 7.2 (H) 09/10/2020   REPTSTATUS 09/11/2020 FINAL 09/10/2020   GRAMSTAIN  06/23/2019    FEW WBC PRESENT, PREDOMINANTLY PMN RARE GRAM POSITIVE COCCI    CULT (A) 09/10/2020    <10,000 COLONIES/mL INSIGNIFICANT GROWTH Performed at [facility], [address] [place], [place] [address]    LABORGA LECLERCIA ADECARBOXYLATA 06/23/2019   LABORGA STAPHYLOCOCCUS CAPRAE 06/23/2019   LABORGA STAPHYLOCOCCUS SIMULANS 06/23/2019     Lab Results  Component Value Date   ALBUMIN 4.2 09/10/2020   ALBUMIN 4.5 07/14/2020   ALBUMIN 4.5 06/25/2020    No results found for: MG No results found for: VD25OH  No results found for: PREALBUMIN CBC EXTENDED Latest Ref Rng & Units 03/20/2021 03/19/2021 03/15/2021  WBC 4.0 - 10.5 K/uL 12.1(H) 14.3(H) 12.9(H)  RBC 4.22 - 5.81 MIL/uL 4.56 4.27 4.50  HGB 13.0 - 17.0 g/dL 10.2 12.7(L) 13.8  HCT 39.0 - 52.0 % 38.9(L) 37.5(L) 39.8  PLT 150 - 400 K/uL 395 392 430(H)  NEUTROABS 1.7 - 7.7 K/uL - - 8.9(H)  LYMPHSABS 0.7 -  4.0 K/uL - - 2.5     There is no height or weight on file to calculate BMI.  Orders:  Orders Placed This Encounter  Procedures   XR Knee 1-2 Views Left   No orders of the defined types were placed in this encounter.    Procedures: No procedures performed  Clinical Data: No additional findings.  ROS:  All other systems negative, except as noted in the HPI. Review of Systems  Objective: Vital Signs: There were no vitals taken for this visit.  Specialty Comments:  No specialty comments available.  PMFS History: Patient Active Problem List   Diagnosis Date Noted   Type 2 diabetes mellitus with other specified complication (HCC) 04/21/2021   Acute osteomyelitis of left foot (HCC) 03/18/2021   Cutaneous  abscess of left foot    Dehiscence of amputation stump (HCC)    Subacute osteomyelitis of left foot (HCC)    Rupture of operation wound    Subacute osteomyelitis, left ankle and foot (HCC)    Overgrown toenails 10/30/2020   Pre-operative clearance 09/16/2020   Abnormal EKG 09/16/2020   Chronic left shoulder pain 04/10/2020   Toe pain 02/21/2020   Anxiety 02/08/2020   Left knee pain 02/07/2020   Inguinodynia, right 11/13/2019   Elevated lipids 10/04/2019   Diabetic ulcer of toe of left foot associated with diabetes mellitus due to underlying condition (HCC) 08/23/2019   History of right inguinal hernia 08/14/2019   Encounter to establish care 08/14/2019   Anxiety 08/14/2019   Right shoulder pain 08/14/2019   Cellulitis 06/22/2019   Essential hypertension 05/27/2015   Type 2 diabetes mellitus without complication (HCC) 05/27/2015   Diverticulosis of large intestine without hemorrhage 10/16/2014   Recurrent right inguinal hernia 09/27/2014   Tobacco use 08/01/2014   Hematuria, gross 07/03/2014   Lower urinary tract symptoms (LUTS) 07/03/2014   Diabetes mellitus (HCC) 06/25/2014   Inguinal hernia, bilateral  04/10/2014   Past Medical History:  Diagnosis Date   Arthritis    right shoulder   Depression    Diabetes mellitus    Type II   Headache    migraines   High cholesterol    Humerus fracture    right   Hypertension    Inguinal hernia    Neuromuscular disorder (HCC)    neuropathy    Family History  Problem Relation Age of Onset   Diabetes Mother    Hypertension Mother    Diabetes Father    Hypertension Father    Cancer Brother        brain    Past Surgical History:  Procedure Laterality Date   AMPUTATION Left 12/10/2020   Procedure: LEFT GREAT TOE AMPUTATION;  Surgeon: [name], MD;  [facility];  Service: Orthopedics;  Laterality: Left;   AMPUTATION Left 01/21/2021   Procedure: 1ST AND 2ND RAY AMPUTATION LEFT FOOT;  Surgeon: [name], MD;   Location: [facility] OR;  Service: Orthopedics;  Laterality: Left;   AMPUTATION Left 03/18/2021   Procedure: LEFT BELOW KNEE AMPUTATION;  Surgeon: [name], MD;  Location: [facility] OR;  Service: Orthopedics;  Laterality: Left;   AMPUTATION TOE Left 06/23/2019   Procedure: AMPUTATION TOE LEFT AMPUTATION;  Surgeon: Linus Galas, DPM;  Location: [facility] ORS;  Service: Podiatry;  Laterality: Left;   AMPUTATION TOE Left 08/31/2019   Procedure: AMPUTATION TOE  IPJ [id number];  Surgeon: Linus Galas, DPM;  Location: [facility] ORS;  Service: Podiatry;  Laterality: Left;   CYSTOSCOPY  2015   Biopsy   HERNIA REPAIR Right 2008   [place]   REVERSE SHOULDER ARTHROPLASTY Right 09/18/2020   Procedure: RIGHT REVERSE SHOULDER ARTHROPLASTY;  Surgeon: Cammy Copa, MD;  Location[facility] OR;  Service: Orthopedics;  Laterality: Right;   Social History   Occupational History   Occupation: unemployed  Tobacco Use   Smoking status: Every Day    Packs/day: 0.25    Years: 43.00    Pack years: 10.75    Types: Cigarettes   Smokeless tobacco: Never   Tobacco comments:    cut down to quarter pack a day, aware of resources  Vaping Use   Vaping Use: Never used  Substance and Sexual Activity   Alcohol use: Not Currently    Alcohol/week: 2.0 standard drinks    Types: 2 Cans of beer per week    Comment: occasional beer    Drug use: Yes    Frequency: 7.0 times per week    Types: Marijuana    Comment: for neuropathy - last use 03/08/21   Sexual activity: Yes

## 2021-06-16 ENCOUNTER — Emergency Department
Admission: EM | Admit: 2021-06-16 | Discharge: 2021-06-16 | Disposition: A | Discharge status: Home / Self Care | Payer: Medicaid Other | Attending: Emergency Medicine | Admitting: Emergency Medicine

## 2021-06-16 ENCOUNTER — Telehealth: Payer: Self-pay | Admitting: Orthopedic Surgery

## 2021-06-16 ENCOUNTER — Other Ambulatory Visit: Payer: Self-pay

## 2021-06-16 ENCOUNTER — Emergency Department: Payer: Medicaid Other

## 2021-06-16 DIAGNOSIS — W010XXA Fall on same level from slipping, tripping and stumbling without subsequent striking against object, initial encounter: Secondary | ICD-10-CM | POA: Insufficient documentation

## 2021-06-16 DIAGNOSIS — Z89512 Acquired absence of left leg below knee: Secondary | ICD-10-CM | POA: Diagnosis not present

## 2021-06-16 DIAGNOSIS — S4991XA Unspecified injury of right shoulder and upper arm, initial encounter: Secondary | ICD-10-CM | POA: Insufficient documentation

## 2021-06-16 DIAGNOSIS — Z5321 Procedure and treatment not carried out due to patient leaving prior to being seen by health care provider: Secondary | ICD-10-CM | POA: Diagnosis not present

## 2021-06-16 NOTE — ED Triage Notes (Signed)
Pt comes into the ED via EMS from home with c/ trip and fall on Saturday and is having right shoulder pain  Pa has a BLK amputation of the left leg,

## 2021-06-16 NOTE — Telephone Encounter (Signed)
Pt called stating he fell and is now having some pain in his shoulder where Dr. August Saucer put screws in about a yr ago. Pt made an appt for 06/19/21 but would like a CB with any advice to ease his pain until then.   [phone]

## 2021-06-16 NOTE — Telephone Encounter (Signed)
Please advise 

## 2021-06-16 NOTE — Telephone Encounter (Signed)
I left voicemail for patient advising. 

## 2021-06-16 NOTE — ED Triage Notes (Signed)
See first nurse note- Pt to ER after losing his balance and landing on his right shoulder. Pt reports limited range of motion in shoulder since the accident. Has been taking over the counter pain medication without relief in symptoms.

## 2021-06-16 NOTE — Telephone Encounter (Signed)
Ok for tylemnol and advil pls calal thx

## 2021-06-17 ENCOUNTER — Telehealth: Payer: Self-pay | Admitting: Orthopedic Surgery

## 2021-06-17 ENCOUNTER — Other Ambulatory Visit: Payer: Self-pay | Admitting: Surgical

## 2021-06-17 ENCOUNTER — Other Ambulatory Visit: Payer: Self-pay

## 2021-06-17 MED ORDER — HYDROCODONE-ACETAMINOPHEN 5-325 MG PO TABS: 1.0000 | ORAL_TABLET | Freq: Two times a day (BID) | ORAL | 0 refills | Status: DC | PRN

## 2021-06-17 NOTE — Telephone Encounter (Signed)
IC advised per below. Also scheduled f/u appt

## 2021-06-17 NOTE — Telephone Encounter (Signed)
If he is having continued pain in the shoulder, I'd recommend making appointment to see me or Dr. August Saucer like we discussed back in March to make sure he doesn't have low-grade infection in the shoulder. Will send in small course of hydrocodone for pain control in the meantime but dont want to refill this before he is seen next in clinic

## 2021-06-17 NOTE — Telephone Encounter (Signed)
Pt called stating he has been taking tylenol and advil since Saturday for his shoulder pain and it hasn't been helping. Pt would like a CB to discuss having something a little stronger called in.   [phone]

## 2021-06-17 NOTE — Telephone Encounter (Signed)
Pt called stating he is having difficulties at the pharmacy the hydrocodone prescription was sent to this morning and asked if it can be sent to a new one. The [facility] is requesting is [facility] (at [address] [place], [place] [address]). I have taken off the two pharmacies on file that he stated he not longer used and [facility]. Pt is in a lot of pain and asked if he could receive a call once it has been sent to the new pharmacy. The best call back number is [phone].

## 2021-06-17 NOTE — Telephone Encounter (Signed)
Tried calling to advise sent. No answer.  

## 2021-06-17 NOTE — Telephone Encounter (Signed)
[facility] [address] [facility] [place]

## 2021-06-17 NOTE — Telephone Encounter (Signed)
Patient called regarding rx refill for hydrocodone he stated the [facility] was supposed to [id number] [place], [place] - [address] call back:[phone]

## 2021-06-19 ENCOUNTER — Telehealth: Payer: Self-pay

## 2021-06-19 ENCOUNTER — Encounter: Payer: Self-pay | Admitting: Orthopedic Surgery

## 2021-06-19 ENCOUNTER — Ambulatory Visit (INDEPENDENT_AMBULATORY_CARE_PROVIDER_SITE_OTHER): Payer: Medicaid Other | Admitting: Orthopedic Surgery

## 2021-06-19 ENCOUNTER — Ambulatory Visit (INDEPENDENT_AMBULATORY_CARE_PROVIDER_SITE_OTHER): Payer: Medicaid Other

## 2021-06-19 DIAGNOSIS — M25511 Pain in right shoulder: Secondary | ICD-10-CM | POA: Diagnosis not present

## 2021-06-19 NOTE — Telephone Encounter (Signed)
Reminder to send in meds on Monday  [place] 5/325 #10 per Dr August Saucer

## 2021-06-19 NOTE — Progress Notes (Signed)
Office Visit Note   Patient: Justin Landry           Date of Birth: [birth year]           MRN: [id number] Visit Date: 06/19/2021 Requested by: Langston Reusing, NP [place],  [place] [address] PCP: Langston Reusing, NP  Subjective: Chief Complaint  Patient presents with   Right Shoulder - Pain    HPI: Justin Landry is a 60 y.o. male who presents to the office complaining of right shoulder pain.  Patient complains of continued right shoulder pain with no significant improvement since prior office visit earlier this year.  He has had multiple falls with the most recent fall about 1 week ago and does complain of worsened pain since the falls.  He notes severe pain in the anterior right shoulder that causes him severe pain with any small amount of range of motion of the shoulder.  He has been taking Tylenol, ice, hydrocodone without any relief.  Denies any fevers, chills, night sweats, drainage from the incision, change in appearance of the incision.  He does report night pain that wakes him up at night.  He is not able to use his wheelchair with his right arm and just has to rely on the left arm in order to ambulate with his wheelchair.  Does have history of diabetes with last A1c 7.5 in April 2022.  He has had recent left BKA by Dr. Sharol Given in April 2022.  He is recovering well from the surgery and reports that his stump is healing well..                ROS: All systems reviewed are negative as they relate to the chief complaint within the history of present illness.  Patient denies fevers or chills.  Assessment & Plan: Visit Diagnoses:  1. Right shoulder pain, unspecified chronicity     Plan: Patient is a 60 year old male who presents for evaluation of right shoulder pain.  He has history of right shoulder reverse shoulder arthroplasty with persistent pain that has not improved over the last several months.  He has history of diabetes that is somewhat  controlled but not well controlled with A1c of 7.5.  He has  pain that causes him to not be able to really use his right shoulder.  Radiographs taken today show no significant change in position of the prosthesis compared with prior radiographs.  No lucencies noted or any suggestion of loosening of the components.  With patient's  pain, there is some concern for prosthetic joint infection of the right shoulder.  Another consideration would be disruption of the interface between the prosthesis and the bone or a nondisplaced periprosthetic fracture.  He has had multiple falls which could cause loosening of the hardware.  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  Plan to obtain CT scan of the right shoulder to evaluate the position of the hardware.  Additionally, CBC, CRP, ESR was considered but with patient's somewhat recent surgery 3 months ago, there is potential elevation of these lab values just from that.  Plan to obtain lab work at follow-up appointment to review his CT scan which will be a little further out and decide on plan from these results.  Patient agreed with plan.  Follow-up after scan.  Follow-Up Instructions: No follow-ups on  file.   Orders:  Orders Placed This Encounter  Procedures   XR Shoulder Right   CT SHOULDER RIGHT WO CONTRAST   No orders of the defined types were placed in this encounter.     Procedures: No procedures performed   Clinical Data: No additional findings.  Objective: Vital Signs: There were no vitals taken for this visit.  Physical Exam:  Constitutional: Patient appears well-developed HEENT:  Head: Normocephalic Eyes:EOM are normal Neck: Normal range of motion Cardiovascular: Normal rate Pulmonary/chest: Effort normal Neurologic: Patient is alert Skin: Skin is warm Psychiatric: Patient has normal  mood and affect  Ortho Exam: Ortho exam demonstrates right shoulder with well-healed incision from prior right shoulder surgery.  Tenderness diffusely throughout the right shoulder with Popeye deformity noted that is consistent with prior examination from March 2022.  Severe pain with any passive motion of the shoulder and patient is resistant to full range of motion evaluation.  No sinus tract noted.  No increased warmth of the right shoulder compared with contralateral shoulder.  Specialty Comments:  No specialty comments available.  Imaging: No results found.   PMFS History: Patient Active Problem List   Diagnosis Date Noted   Type 2 diabetes mellitus with other specified complication ([place]) 57/84/6962   Acute osteomyelitis of left foot ([place]) 03/18/2021   Cutaneous abscess of left foot    Dehiscence of amputation stump (HCC)    Subacute osteomyelitis of left foot (HCC)    Rupture of operation wound    Subacute osteomyelitis, left ankle and foot ([place])    Overgrown toenails 10/30/2020   Pre-operative clearance 09/16/2020   Abnormal EKG 09/16/2020   Chronic left shoulder pain 04/10/2020   Toe pain 02/21/2020   Anxiety 02/08/2020   Left knee pain 02/07/2020   Inguinodynia, right 11/13/2019   Elevated lipids 10/04/2019   Diabetic ulcer of toe of left foot associated with diabetes mellitus due to underlying condition ([place]) 08/23/2019   History of right inguinal hernia 08/14/2019   Encounter to establish care 08/14/2019   Anxiety 08/14/2019   Right shoulder pain 08/14/2019   Cellulitis 06/22/2019   Essential hypertension 05/27/2015   Type 2 diabetes mellitus without complication ([place]) 95/28/4132   Diverticulosis of large intestine without hemorrhage 10/16/2014   Recurrent right inguinal hernia 09/27/2014   Tobacco use 08/01/2014   Hematuria, gross 07/03/2014   Lower urinary tract symptoms (LUTS) 07/03/2014   Diabetes mellitus ([place]) 06/25/2014   Inguinal hernia, bilateral   04/10/2014   Past Medical History:  Diagnosis Date   Arthritis    right shoulder   Depression    Diabetes mellitus    Type II   Headache    migraines   High cholesterol    Humerus fracture    right   Hypertension    Inguinal hernia    Neuromuscular disorder ([place])    neuropathy    Family History  Problem Relation Age of Onset   Diabetes Mother    Hypertension Mother    Diabetes Father    Hypertension Father    Cancer Brother        brain    Past Surgical History:  Procedure Laterality Date   AMPUTATION Left 12/10/2020   Procedure: LEFT GREAT TOE AMPUTATION;  Surgeon: Newt Minion, MD;  Location: [place];  Service: Orthopedics;  Laterality: Left;   AMPUTATION Left 01/21/2021   Procedure: 1ST AND 2ND RAY AMPUTATION LEFT FOOT;  Surgeon: Newt Minion, MD;  Location: [place];  Service:  Orthopedics;  Laterality: Left;   AMPUTATION Left 03/18/2021   Procedure: LEFT BELOW KNEE AMPUTATION;  Surgeon: Newt Minion, MD;  Location: [place];  Service: Orthopedics;  Laterality: Left;   AMPUTATION TOE Left 06/23/2019   Procedure: AMPUTATION TOE LEFT AMPUTATION;  Surgeon: Sharlotte Alamo, DPM;  Location: [facility];  Service: Podiatry;  Laterality: Left;   AMPUTATION TOE Left 08/31/2019   Procedure: AMPUTATION TOE  IPJ [id number];  Surgeon: Sharlotte Alamo, DPM;  Location: [facility];  Service: Podiatry;  Laterality: Left;   CYSTOSCOPY  2015   Biopsy   HERNIA REPAIR Right 2008   [place]   REVERSE SHOULDER ARTHROPLASTY Right 09/18/2020   Procedure: RIGHT REVERSE SHOULDER ARTHROPLASTY;  Surgeon: Meredith Pel, MD;  Location: [facility];  Service: Orthopedics;  Laterality: Right;   Social History   Occupational History   Occupation: unemployed  Tobacco Use   Smoking status: Every Day    Packs/day: 0.25    Years: 43.00    Pack years: 10.75    Types: Cigarettes   Smokeless tobacco: Never   Tobacco comments:    cut down to quarter pack a day, aware of resources  Vaping Use   Vaping Use: Never used   Substance and Sexual Activity   Alcohol use: Not Currently    Alcohol/week: 2.0 standard drinks    Types: 2 Cans of beer per week    Comment: occasional beer    Drug use: Yes    Frequency: 7.0 times per week    Types: Marijuana    Comment: for neuropathy - last use 03/08/21   Sexual activity: Yes

## 2021-06-22 ENCOUNTER — Telehealth: Payer: Self-pay | Admitting: Orthopedic Surgery

## 2021-06-22 ENCOUNTER — Other Ambulatory Visit: Payer: Self-pay | Admitting: Surgical

## 2021-06-22 MED ORDER — HYDROCODONE-ACETAMINOPHEN 5-325 MG PO TABS: 1.0000 | ORAL_TABLET | Freq: Two times a day (BID) | ORAL | 0 refills | Status: DC | PRN

## 2021-06-22 NOTE — Telephone Encounter (Signed)
Sent in rx.

## 2021-06-22 NOTE — Telephone Encounter (Signed)
Pt called about updates for refills. Please call patient at [phone].

## 2021-06-22 NOTE — Telephone Encounter (Signed)
Patient called. Would like [id number] [place], [place] - [address]

## 2021-06-24 ENCOUNTER — Ambulatory Visit: Payer: Self-pay | Admitting: Gerontology

## 2021-06-24 ENCOUNTER — Telehealth: Payer: Self-pay

## 2021-06-24 ENCOUNTER — Ambulatory Visit: Payer: Medicaid Other | Admitting: Surgical

## 2021-06-24 NOTE — Telephone Encounter (Signed)
Pt and his [employer] agent called to verify that pts insurance for the medication that was sent in.   Pt stated that when he went to pick up the RX that [facility] tablets instead of 14.

## 2021-06-24 NOTE — Telephone Encounter (Signed)
I sent in 14 tablets but only 10 were approved prob.  Don't really want to send in any follow-up Rxs as managing chronic pain is not my intended role

## 2021-06-25 NOTE — Telephone Encounter (Signed)
I called and discussed with the pt and he stated understanding. He sees pain management on 8/3

## 2021-06-26 NOTE — Telephone Encounter (Signed)
Thanks

## 2021-07-01 ENCOUNTER — Other Ambulatory Visit: Payer: Self-pay

## 2021-07-02 ENCOUNTER — Other Ambulatory Visit: Payer: Self-pay | Admitting: Surgical

## 2021-07-02 ENCOUNTER — Other Ambulatory Visit: Payer: Self-pay

## 2021-07-02 ENCOUNTER — Telehealth: Payer: Self-pay | Admitting: Orthopedic Surgery

## 2021-07-02 NOTE — Telephone Encounter (Signed)
His prescription was sent on 7/11 to the correct pharmacy that he requested last time.  PDMP shows that he picked up the RX and he stated understanding to no more refills as he sees pain management in the near future.  No further action

## 2021-07-02 NOTE — Telephone Encounter (Signed)
Patient called advised his Rx for Hydrocodone was sent to the wrong pharmacy. [facility] [address]  [place]  Phone #  to contact patient is [phone]

## 2021-07-03 ENCOUNTER — Telehealth: Payer: Self-pay | Admitting: Surgical

## 2021-07-03 NOTE — Telephone Encounter (Signed)
Pt called back and would like to speak with betsy!   CB [phone]

## 2021-07-03 NOTE — Telephone Encounter (Signed)
I left voicemail for patient advising rx on 06/22/2021 was picked up from the pharmacy and he had advised he only received #10 tablets. Advised no new prescriptions have or will be sent as he is going to be starting pain management.

## 2021-07-03 NOTE — Telephone Encounter (Signed)
CT review scheduled for 07/17/2021 while I had patient on the phone.

## 2021-07-03 NOTE — Telephone Encounter (Signed)
I called patient and advised of previous messages from [place]. He states that [facility] called him and asked him if he was going to pick up a rx they have there and he thought that [facility] sent him in something new to the incorrect pharmacy. I advised that the last rx [facility] sent in was on 06/22/2021 and that this was going to be the last rx for pain medication that he was going to write due to patient beginning pain management. After speaking in depth, he understands.

## 2021-07-08 ENCOUNTER — Other Ambulatory Visit: Payer: Self-pay

## 2021-07-08 ENCOUNTER — Ambulatory Visit: Payer: Medicaid Other | Admitting: Gerontology

## 2021-07-08 ENCOUNTER — Encounter: Payer: Self-pay | Admitting: Gerontology

## 2021-07-08 VITALS — BP 151/90 | HR 75 | Temp 98.1°F

## 2021-07-08 DIAGNOSIS — G6289 Other specified polyneuropathies: Secondary | ICD-10-CM

## 2021-07-08 DIAGNOSIS — I10 Essential (primary) hypertension: Secondary | ICD-10-CM

## 2021-07-08 DIAGNOSIS — E1169 Type 2 diabetes mellitus with other specified complication: Secondary | ICD-10-CM

## 2021-07-08 LAB — POCT GLYCOSYLATED HEMOGLOBIN (HGB A1C): Hemoglobin A1C: 6.3 % — AB (ref 4.0–5.6)

## 2021-07-08 LAB — GLUCOSE, POCT (MANUAL RESULT ENTRY)
POC Glucose: 61 mg/dl — AB (ref 70–99)
POC Glucose: 87 mg/dl (ref 70–99)

## 2021-07-08 MED ORDER — AMLODIPINE BESYLATE 10 MG PO TABS: 10.0000 mg | ORAL_TABLET | Freq: Every day | ORAL | 2 refills | Status: DC

## 2021-07-08 MED ORDER — METFORMIN HCL 1000 MG PO TABS
ORAL_TABLET | Freq: Two times a day (BID) | ORAL | 2 refills | Status: AC
Start: 2021-07-08 — End: 2024-07-30
  Filled 2021-08-11: qty 60, 30d supply, fill #0

## 2021-07-08 MED ORDER — GABAPENTIN 400 MG PO CAPS
400.0000 mg | ORAL_CAPSULE | Freq: Three times a day (TID) | ORAL | 2 refills | Status: DC
Start: 2021-07-08 — End: 2021-09-15
  Filled 2021-08-11: qty 90, 30d supply, fill #0

## 2021-07-08 MED ORDER — LISINOPRIL 40 MG PO TABS
40.0000 mg | ORAL_TABLET | Freq: Every day | ORAL | 2 refills | Status: DC
  Filled 2021-08-11: qty 30, 30d supply, fill #0
  Filled 2021-09-23: qty 30, 30d supply, fill #1

## 2021-07-08 NOTE — Patient Instructions (Signed)
https://www.nhlbi.nih.gov/files/docs/public/heart/dash_brief.pdf">  DASH Eating Plan DASH stands for Dietary Approaches to Stop Hypertension. The DASH eating plan is a healthy eating plan that has been shown to: Reduce high blood pressure (hypertension). Reduce your risk for type 2 diabetes, heart disease, and stroke. Help with weight loss. What are tips for following this plan? Reading food labels Check food labels for the amount of salt (sodium) per serving. Choose foods with less than 5 percent of the Daily Value of sodium. Generally, foods with less than 300 milligrams (mg) of sodium per serving fit into this eating plan. To find whole grains, look for the word "whole" as the first word in the ingredient list. Shopping Buy products labeled as "low-sodium" or "no salt added." Buy fresh foods. Avoid canned foods and pre-made or frozen meals. Cooking Avoid adding salt when cooking. Use salt-free seasonings or herbs instead of table salt or sea salt. Check with your health care provider or pharmacist before using salt substitutes. Do not fry foods. Cook foods using healthy methods such as baking, boiling, grilling, roasting, and broiling instead. Cook with heart-healthy oils, such as olive, canola, avocado, soybean, or sunflower oil. Meal planning  Eat a balanced diet that includes: 4 or more servings of fruits and 4 or more servings of vegetables each day. Try to fill one-half of your plate with fruits and vegetables. 6-8 servings of whole grains each day. Less than 6 oz (170 g) of lean meat, poultry, or fish each day. A 3-oz (85-g) serving of meat is about the same size as a deck of cards. One egg equals 1 oz (28 g). 2-3 servings of low-fat dairy each day. One serving is 1 cup (237 mL). 1 serving of nuts, seeds, or beans 5 times each week. 2-3 servings of heart-healthy fats. Healthy fats called omega-3 fatty acids are found in foods such as walnuts, flaxseeds, fortified milks, and eggs.  These fats are also found in cold-water fish, such as sardines, salmon, and mackerel. Limit how much you eat of: Canned or prepackaged foods. Food that is high in trans fat, such as some fried foods. Food that is high in saturated fat, such as fatty meat. Desserts and other sweets, sugary drinks, and other foods with added sugar. Full-fat dairy products. Do not salt foods before eating. Do not eat more than 4 egg yolks a week. Try to eat at least 2 vegetarian meals a week. Eat more home-cooked food and less restaurant, buffet, and fast food.  Lifestyle When eating at a restaurant, ask that your food be prepared with less salt or no salt, if possible. If you drink alcohol: Limit how much you use to: 0-1 drink a day for women who are not pregnant. 0-2 drinks a day for men. Be aware of how much alcohol is in your drink. In the [place] one drink equals one 12 oz bottle of beer (355 mL), one 5 oz glass of wine (148 mL), or one 1 oz glass of hard liquor (44 mL). General information Avoid eating more than 2,300 mg of salt a day. If you have hypertension, you may need to reduce your sodium intake to 1,500 mg a day. Work with your health care provider to maintain a healthy body weight or to lose weight. Ask what an ideal weight is for you. Get at least 30 minutes of exercise that causes your heart to beat faster (aerobic exercise) most days of the week. Activities may include walking, swimming, or biking. Work with your health care provider   or dietitian to adjust your eating plan to your individual calorie needs. What foods should I eat? Fruits All fresh, dried, or frozen fruit. Canned fruit in natural juice (without addedsugar). Vegetables Fresh or frozen vegetables (raw, steamed, roasted, or grilled). Low-sodium or reduced-sodium tomato and vegetable juice. Low-sodium or reduced-sodium tomatosauce and tomato paste. Low-sodium or reduced-sodium canned vegetables. Grains Whole-grain or  whole-wheat bread. Whole-grain or whole-wheat pasta. Brown rice. Oatmeal. Quinoa. Bulgur. Whole-grain and low-sodium cereals. Pita bread.Low-fat, low-sodium crackers. Whole-wheat flour tortillas. Meats and other proteins Skinless chicken or turkey. Ground chicken or turkey. Pork with fat trimmed off. Fish and seafood. Egg whites. Dried beans, peas, or lentils. Unsalted nuts, nut butters, and seeds. Unsalted canned beans. Lean cuts of beef with fat trimmed off. Low-sodium, lean precooked or cured meat, such as sausages or meatloaves. Dairy Low-fat (1%) or fat-free (skim) milk. Reduced-fat, low-fat, or fat-free cheeses. Nonfat, low-sodium ricotta or cottage cheese. Low-fat or nonfatyogurt. Low-fat, low-sodium cheese. Fats and oils Soft margarine without trans fats. Vegetable oil. Reduced-fat, low-fat, or light mayonnaise and salad dressings (reduced-sodium). Canola, safflower, olive, avocado, soybean, andsunflower oils. Avocado. Seasonings and condiments Herbs. Spices. Seasoning mixes without salt. Other foods Unsalted popcorn and pretzels. Fat-free sweets. The items listed above may not be a complete list of foods and beverages you can eat. Contact a dietitian for more information. What foods should I avoid? Fruits Canned fruit in a light or heavy syrup. Fried fruit. Fruit in cream or buttersauce. Vegetables Creamed or fried vegetables. Vegetables in a cheese sauce. Regular canned vegetables (not low-sodium or reduced-sodium). Regular canned tomato sauce and paste (not low-sodium or reduced-sodium). Regular tomato and vegetable juice(not low-sodium or reduced-sodium). Pickles. Olives. Grains Baked goods made with fat, such as croissants, muffins, or some breads. Drypasta or rice meal packs. Meats and other proteins Fatty cuts of meat. Ribs. Fried meat. Bacon. Bologna, salami, and other precooked or cured meats, such as sausages or meat loaves. Fat from the back of a pig (fatback). Bratwurst.  Salted nuts and seeds. Canned beans with added salt. Canned orsmoked fish. Whole eggs or egg yolks. Chicken or turkey with skin. Dairy Whole or 2% milk, cream, and half-and-half. Whole or full-fat cream cheese. Whole-fat or sweetened yogurt. Full-fat cheese. Nondairy creamers. Whippedtoppings. Processed cheese and cheese spreads. Fats and oils Butter. Stick margarine. Lard. Shortening. Ghee. Bacon fat. Tropical oils, suchas coconut, palm kernel, or palm oil. Seasonings and condiments Onion salt, garlic salt, seasoned salt, table salt, and sea salt. Worcestershire sauce. Tartar sauce. Barbecue sauce. Teriyaki sauce. Soy sauce, including reduced-sodium. Steak sauce. Canned and packaged gravies. Fish sauce. Oyster sauce. Cocktail sauce. Store-bought horseradish. Ketchup. Mustard. Meat flavorings and tenderizers. Bouillon cubes. Hot sauces. Pre-made or packaged marinades. Pre-made or packaged taco seasonings. Relishes. Regular saladdressings. Other foods Salted popcorn and pretzels. The items listed above may not be a complete list of foods and beverages you should avoid. Contact a dietitian for more information. Where to find more information National Heart, Lung, and Blood Institute: www.nhlbi.nih.gov American Heart Association: www.heart.org Academy of Nutrition and Dietetics: www.eatright.org National Kidney Foundation: www.kidney.org Summary The DASH eating plan is a healthy eating plan that has been shown to reduce high blood pressure (hypertension). It may also reduce your risk for type 2 diabetes, heart disease, and stroke. When on the DASH eating plan, aim to eat more fresh fruits and vegetables, whole grains, lean proteins, low-fat dairy, and heart-healthy fats. With the DASH eating plan, you should limit salt (sodium) intake to 2,300   mg a day. If you have hypertension, you may need to reduce your sodium intake to 1,500 mg a day. Work with your health care provider or dietitian to adjust  your eating plan to your individual calorie needs. This information is not intended to replace advice given to you by your health care provider. Make sure you discuss any questions you have with your healthcare provider. Document Revised: 11/02/2019 Document Reviewed: 11/02/2019 Elsevier Patient Education  2022 Elsevier Inc.  

## 2021-07-08 NOTE — Progress Notes (Signed)
Established Patient Office Visit  Subjective:  Patient ID: Justin Landry, male    DOB: June 28, 1961  Age: 60 y.o. MRN: [id number]  CC:  Chief Complaint  Patient presents with   Follow-up    Pt was seen in May for diabetes and hypertension. Pt reports being out of all of his medications. Experiencing right shoulder pain- has appointment at [facility] to see dr he had surgery with. Pt is also experiencing low back pain from a series of falls in June due to getting in/out of wheelchair.     HPI Justin Landry is a 60 y/o male with PMH of T2DM, Arthritis right shoulder, Depression, Hypertension, Hyperlipidemia, presents for routine visit, lab review and medication refill. He states that he was out of his medication. He states that he has not been checking his blood glucose because he was out of test stripes and lancets. He denies hypoglycemic/hyperglycemic symptoms. His blood glucose was 61 mg/dl during visit and he was treated for hypoglycemia and it came up to 87 mg/dl. His HgbA1c done during visit was 6.3%.  He is s/p left BKA and his surgical site has completely healed up, and  he continues to wear knee immobilizer and stump shrinker.  He uses wheelchair to get around. His blood pressure was elevated during visit, and he was out of his medication. He continues to follow up with Orthopedic for his right shoulder pain. Overall, he states that he's doing well and offers no further complaint.  Past Medical History:  Diagnosis Date   Arthritis    right shoulder   Depression    Diabetes mellitus    Type II   Headache    migraines   High cholesterol    Humerus fracture    right   Hypertension    Inguinal hernia    Neuromuscular disorder ([place])    neuropathy    Past Surgical History:  Procedure Laterality Date   AMPUTATION Left 12/10/2020   Procedure: LEFT GREAT TOE AMPUTATION;  Surgeon: Newt Minion, MD;  Location: [place];  Service: Orthopedics;   Laterality: Left;   AMPUTATION Left 01/21/2021   Procedure: 1ST AND 2ND RAY AMPUTATION LEFT FOOT;  Surgeon: Newt Minion, MD;  Location: [place];  Service: Orthopedics;  Laterality: Left;   AMPUTATION Left 03/18/2021   Procedure: LEFT BELOW KNEE AMPUTATION;  Surgeon: Newt Minion, MD;  Location: [place];  Service: Orthopedics;  Laterality: Left;   AMPUTATION TOE Left 06/23/2019   Procedure: AMPUTATION TOE LEFT AMPUTATION;  Surgeon: Sharlotte Alamo, DPM;  Location: [facility];  Service: Podiatry;  Laterality: Left;   AMPUTATION TOE Left 08/31/2019   Procedure: AMPUTATION TOE  [id number] [id number];  Surgeon: Sharlotte Alamo, DPM;  Location: [facility];  Service: Podiatry;  Laterality: Left;   CYSTOSCOPY  2015   Biopsy   HERNIA REPAIR Right 2008   [place]   REVERSE SHOULDER ARTHROPLASTY Right 09/18/2020   Procedure: RIGHT REVERSE SHOULDER ARTHROPLASTY;  Surgeon: Meredith Pel, MD;  Location: [place];  Service: Orthopedics;  Laterality: Right;    Family History  Problem Relation Age of Onset   Diabetes Mother    Hypertension Mother    Diabetes Father    Hypertension Father    Cancer Brother        brain    Social History   Socioeconomic History   Marital status: Single    Spouse name: Not on file   Number of children: 0   Years  of education: Not on file   Highest education level: 8th grade  Occupational History   Occupation: unemployed  Tobacco Use   Smoking status: Every Day    Packs/day: 0.25    Years: 43.00    Pack years: 10.75    Types: Cigarettes   Smokeless tobacco: Never   Tobacco comments:    cut down to quarter pack a day, aware of resources  Vaping Use   Vaping Use: Never used  Substance and Sexual Activity   Alcohol use: Not Currently    Alcohol/week: 2.0 standard drinks    Types: 2 Cans of beer per week    Comment: last use May 2022   Drug use: Not Currently    Frequency: 7.0 times per week    Types: Marijuana    Comment: last use was 06/04/2021   Sexual activity: Yes   Other Topics Concern   Not on file  Social History Narrative   Social determinants completed 02/21/2020. [place] [facility] consent obtained. Referral for job assistance, housing, and gas card were made in [employer] [place] portal.   Social Determinants of Health   Financial Resource Strain: Not on file  Food Insecurity: No Food Insecurity   Worried About Charity fundraiser in the Last Year: Never true   [place] in the Last Year: Never true  Transportation Needs: No Transportation Needs   Lack of Transportation (Medical): No   Lack of Transportation (Non-Medical): No  Physical Activity: Not on file  Stress: Not on file  Social Connections: Not on file  Intimate Partner Violence: Not on file    Outpatient Medications Prior to Visit  Medication Sig Dispense Refill   budesonide-formoterol (SYMBICORT) 160-4.5 MCG/ACT inhaler Inhale 2 puffs into the lungs 2 (two) times daily as needed (respiratory issues.).     Fish Oil-Cholecalciferol (FISH OIL + D3) 1000-1000 MG-UNIT CAPS Take 1 capsule by mouth daily with breakfast.     ascorbic acid (VITAMIN C) 500 MG tablet Take 500 mg by mouth daily.      amLODipine (NORVASC) 10 MG tablet Take 1 tablet (10 mg total) by mouth daily. (Patient not taking: Reported on 07/08/2021) 30 tablet 2   blood glucose meter kit and supplies KIT Dispense based on patient and insurance preference. Use up to four times daily as directed. (FOR ICD-9 250.00, 250.01). 1 each 0   gabapentin (NEURONTIN) 400 MG capsule Take 1 capsule (400 mg total) by mouth 3 (three) times daily. (Patient not taking: No sig reported) 90 capsule 2   Glucosamine-Chondroitin (COSAMIN DS PO) Take 1 tablet by mouth daily. (Patient not taking: Reported on 07/08/2021)     lisinopril (ZESTRIL) 40 MG tablet Take 1 tablet (40 mg total) by mouth daily. (Patient not taking: Reported on 07/08/2021) 30 tablet 2   metFORMIN (GLUCOPHAGE) 1000 MG tablet TAKE ONE TABLET BY MOUTH 2 TIMES A DAY WITH A MEAL (Patient not  taking: Reported on 07/08/2021) 60 tablet 2   doxycycline (VIBRA-TABS) 100 MG tablet Take 1 tablet (100 mg total) by mouth 2 (two) times daily. (Patient not taking: Reported on 07/08/2021) 28 tablet 0   glipiZIDE (GLUCOTROL) 10 MG tablet TAKE 1 TABLET BY MOUTH 2 TIMES A DAY WITH A MEAL (Patient not taking: Reported on 07/08/2021) 60 tablet 3   HYDROcodone-acetaminophen (NORCO/VICODIN) 5-325 MG tablet Take 1 tablet by mouth every 12 (twelve) hours as needed for moderate pain. (Patient not taking: Reported on 07/08/2021) 14 tablet 0   methocarbamol (ROBAXIN) 500  MG tablet Take 1 tablet (500 mg total) by mouth every 6 (six) hours as needed for muscle spasms. (Patient not taking: Reported on 07/08/2021) 30 tablet 0   zinc sulfate 220 (50 Zn) MG capsule Take 1 capsule (220 mg total) by mouth daily. (Patient not taking: Reported on 07/08/2021) 220 capsule    No facility-administered medications prior to visit.    Allergies  Allergen Reactions   Anacin-3 [Acetaminophen] Nausea And Vomiting   Morphine And Related Nausea And Vomiting and Other (See Comments)    Extreme sweating    Oxycontin [Oxycodone Hcl] Other (See Comments)    Extreme NAusea and vomitting follows (Patient Tolerates Percocet short acting)   Penicillins Nausea And Vomiting    Has patient had a PCN reaction causing immediate rash, facial/tongue/throat swelling, SOB or lightheadedness with hypotension: No Has patient had a PCN reaction causing severe rash involving mucus membranes or skin necrosis: No Has patient had a PCN reaction that required hospitalization No Has patient had a PCN reaction occurring within the last 10 years: No If all of the above answers are "NO", then may proceed with Cephalosporin use.    Tramadol Nausea Only    ROS Review of Systems  Constitutional: Negative.   Eyes: Negative.   Respiratory: Negative.    Cardiovascular: Negative.   Endocrine: Negative.   Skin: Negative.   Neurological: Negative.    Psychiatric/Behavioral: Negative.       Objective:    Physical Exam HENT:     Head: Normocephalic and atraumatic.     Mouth/Throat:     Mouth: Mucous membranes are moist.  Eyes:     Extraocular Movements: Extraocular movements intact.     Conjunctiva/sclera: Conjunctivae normal.     Pupils: Pupils are equal, round, and reactive to light.  Cardiovascular:     Rate and Rhythm: Normal rate and regular rhythm.     Pulses: Normal pulses.     Heart sounds: Normal heart sounds.  Pulmonary:     Effort: Pulmonary effort is normal.     Breath sounds: Normal breath sounds.  Skin:    General: Skin is warm.  Neurological:     General: No focal deficit present.     Mental Status: He is alert and oriented to person, place, and time. Mental status is at baseline.  Psychiatric:        Mood and Affect: Mood normal.        Behavior: Behavior normal.        Thought Content: Thought content normal.        Judgment: Judgment normal.    BP (!) 151/90 (BP Location: Left Arm, Patient Position: Sitting, Cuff Size: Normal)   Pulse 75   Temp 98.1 F (36.7 C)   SpO2 96%  Wt Readings from Last 3 Encounters:  06/16/21 204 lb (92.5 kg)  03/18/21 210 lb (95.3 kg)  03/15/21 210 lb (95.3 kg)     Health Maintenance Due  Topic Date Due   PNEUMOCOCCAL POLYSACCHARIDE VACCINE AGE 77-64 HIGH RISK  Never done   COVID-19 Vaccine (1) Never done   Pneumococcal Vaccine 54-67 Years old (1 - PCV) Never done   OPHTHALMOLOGY EXAM  Never done   HIV Screening  Never done   Hepatitis C Screening  Never done   Zoster Vaccines- Shingrix (1 of 2) Never done   FOOT EXAM  06/21/2020    There are no preventive care reminders to display for this patient.  No results found for: TSH  Lab Results  Component Value Date   WBC 12.1 (H) 03/20/2021   HGB 13.3 03/20/2021   HCT 38.9 (L) 03/20/2021   MCV 85.3 03/20/2021   PLT 395 03/20/2021   Lab Results  Component Value Date   NA 133 (L) 03/20/2021   K 3.8  03/20/2021   CO2 24 03/20/2021   GLUCOSE 156 (H) 03/20/2021   BUN 8 03/20/2021   CREATININE 0.64 03/20/2021   BILITOT 0.5 09/10/2020   ALKPHOS 48 09/10/2020   AST 30 09/10/2020   ALT 39 09/10/2020   PROT 7.4 09/10/2020   ALBUMIN 4.2 09/10/2020   CALCIUM 9.4 03/20/2021   ANIONGAP 11 03/20/2021   GFR 87.21 05/27/2015   Lab Results  Component Value Date   CHOL 151 06/25/2020   Lab Results  Component Value Date   HDL 35 (L) 06/25/2020   Lab Results  Component Value Date   LDLCALC 65 06/25/2020   Lab Results  Component Value Date   TRIG 322 (H) 06/25/2020   Lab Results  Component Value Date   CHOLHDL 4.3 06/25/2020   Lab Results  Component Value Date   HGBA1C 6.3 (A) 07/08/2021      Assessment & Plan:    1. Type 2 diabetes mellitus with other specified complication, without long-term current use of insulin (HCC) -His HgbA1c has greatly improved from 7.5% to 6.3%. His Glipizide was discontinued due to hypoglycemia risk and he will continue on Metformin. He was encouraged to continue on low carb/non concentrated sweet diet. - POCT HgB A1C; Future - POCT Glucose (CBG); Future - POCT Glucose (CBG) - POCT HgB A1C - metFORMIN (GLUCOPHAGE) 1000 MG tablet; TAKE ONE TABLET BY MOUTH 2 TIMES A DAY WITH A MEAL  Dispense: 60 tablet; Refill: 2 - POCT Glucose (CBG); Future - POCT Glucose (CBG)  2. Essential hypertension - His blood pressure is not under control because he was out of his medication. He was advised to pick up medicines from [place] continue on DASH diet. - amLODipine (NORVASC) 10 MG tablet; Take 1 tablet (10 mg total) by mouth daily.  Dispense: 30 tablet; Refill: 2 - lisinopril (ZESTRIL) 40 MG tablet; Take 1 tablet (40 mg total) by mouth daily.  Dispense: 30 tablet; Refill: 2  3. Other polyneuropathy - He will continue on gabapentin for Peripheral neuropathy. - gabapentin (NEURONTIN) 400 MG capsule; Take 1 capsule (400 mg total) by mouth 3 (three)  times daily.  Dispense: 90 capsule; Refill: 2     Follow-up: Return in about 13 weeks (around 10/07/2021), or if symptoms worsen or fail to improve.    Lundon Rosier Jerold Coombe, NP

## 2021-07-09 ENCOUNTER — Ambulatory Visit
Admission: RE | Admit: 2021-07-09 | Discharge: 2021-07-09 | Disposition: A | Discharge status: Home / Self Care | Payer: Medicaid Other | Source: Ambulatory Visit | Attending: Orthopedic Surgery | Admitting: Orthopedic Surgery

## 2021-07-09 DIAGNOSIS — M25511 Pain in right shoulder: Secondary | ICD-10-CM

## 2021-07-17 ENCOUNTER — Other Ambulatory Visit: Payer: Self-pay

## 2021-07-17 ENCOUNTER — Ambulatory Visit (INDEPENDENT_AMBULATORY_CARE_PROVIDER_SITE_OTHER): Payer: Medicaid Other | Admitting: Orthopedic Surgery

## 2021-07-17 ENCOUNTER — Encounter: Payer: Self-pay | Admitting: Orthopedic Surgery

## 2021-07-17 DIAGNOSIS — M25511 Pain in right shoulder: Secondary | ICD-10-CM

## 2021-07-17 MED ORDER — HYDROCODONE-ACETAMINOPHEN 5-325 MG PO TABS: 1.0000 | ORAL_TABLET | Freq: Two times a day (BID) | ORAL | 0 refills | Status: DC | PRN

## 2021-07-17 NOTE — Progress Notes (Signed)
Office Visit Note   Patient: Justin Landry           Date of Birth: 03/02/1961           MRN: [id number] Visit Date: 07/17/2021 Requested by: Langston Reusing, NP [place],  [place] [address] PCP: Langston Reusing, NP  Subjective: Chief Complaint  Patient presents with   Right Shoulder - Follow-up    HPI: Justin Landry is a 60 y.o. male who presents to the office for MRI review. Patient denies any changes in symptoms.  Continues to complain mainly of right shoulder pain.  Has history of right shoulder reverse shoulder arthroplasty.  Describes pain as 8/10 with no complaint of fevers, chills, night sweats.  No drainage from his incision or change in appearance of the incision.  He is ambulating with a wheelchair.  No recent injuries or falls or any significant changes to his symptoms.  CT results revealed: No evidence of fracture or hardware complication.  Small thick-walled periprosthetic fluid collection measuring up to 2.4 cm.               ROS: All systems reviewed are negative as they relate to the chief complaint within the history of present illness.  Patient denies fevers or chills.  Assessment & Plan: Visit Diagnoses:  1. Right shoulder pain, unspecified chronicity     Plan: Justin Landry is a 60 y.o. male who presents to the office complaining of continued right shoulder pain.  He has CT scan of the right shoulder demonstrating fluid collection.  With his persistent pain throughout the postoperative period, need to rule out prosthetic joint infection.  He has a fair number of risk factors for prosthetic joint infection including diabetes, smoking history and history of prior infection. Plan to obtain ESR, CRP, CBC with differential today.  Also plan to refer patient for ultrasound-guided aspiration of the right shoulder joint with subsequent synovial fluid analysis, gram stain, and aerobic/anaerobic culture.  Due to the poor  sensitivity of inflammatory markers for shoulder arthroplasty PJI, may need to consider periprosthetic tissue biopsy if all of these are inconclusive.  Follow-Up Instructions: No follow-ups on file.   Orders:  Orders Placed This Encounter  Procedures   Sed Rate (ESR)   C-reactive protein   CBC with Differential   Meds ordered this encounter  Medications   HYDROcodone-acetaminophen (NORCO/VICODIN) 5-325 MG tablet    Sig: Take 1 tablet by mouth every 12 (twelve) hours as needed for moderate pain.    Dispense:  16 tablet    Refill:  0      Procedures: No procedures performed   Clinical Data: No additional findings.  Objective: Vital Signs: There were no vitals taken for this visit.  Physical Exam:  Constitutional: Patient appears well-developed HEENT:  Head: Normocephalic Eyes:EOM are normal Neck: Normal range of motion Cardiovascular: Normal rate Pulmonary/chest: Effort normal Neurologic: Patient is alert Skin: Skin is warm Psychiatric: Patient has normal mood and affect  Ortho Exam: Ortho exam demonstrates right shoulder with well-healed incision from prior shoulder replacement.  Pain with passive motion of the shoulder but patient is able to tolerate pain.  Well-preserved range of motion of the right shoulder.  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  No expressible drainage from the incision or any sinus tract noted.  No significantly increased warmth over the incision.  Specialty Comments:  No specialty comments available.  Imaging: No results found.  PMFS History: Patient Active Problem List   Diagnosis Date Noted   Type 2 diabetes mellitus with other specified complication ([place]) 57/84/6962   Acute osteomyelitis of left foot ([place]) 03/18/2021   Cutaneous abscess of left foot    Dehiscence of amputation stump (HCC)    Subacute osteomyelitis of left foot (HCC)    Rupture of operation wound    Subacute osteomyelitis, left ankle and foot ([place])     Overgrown toenails 10/30/2020   Pre-operative clearance 09/16/2020   Abnormal EKG 09/16/2020   Chronic left shoulder pain 04/10/2020   Toe pain 02/21/2020   Anxiety 02/08/2020   Left knee pain 02/07/2020   Inguinodynia, right 11/13/2019   Elevated lipids 10/04/2019   Diabetic ulcer of toe of left foot associated with diabetes mellitus due to underlying condition ([place]) 08/23/2019   History of right inguinal hernia 08/14/2019   Encounter to establish care 08/14/2019   Anxiety 08/14/2019   Right shoulder pain 08/14/2019   Cellulitis 06/22/2019   Essential hypertension 05/27/2015   Type 2 diabetes mellitus without complication ([place]) 95/28/4132   Diverticulosis of large intestine without hemorrhage 10/16/2014   Recurrent right inguinal hernia 09/27/2014   Tobacco use 08/01/2014   Hematuria, gross 07/03/2014   Lower urinary tract symptoms (LUTS) 07/03/2014   Diabetes mellitus ([place]) 06/25/2014   Inguinal hernia, bilateral  04/10/2014   Past Medical History:  Diagnosis Date   Arthritis    right shoulder   Depression    Diabetes mellitus    Type II   Headache    migraines   High cholesterol    Humerus fracture    right   Hypertension    Inguinal hernia    Neuromuscular disorder ([place])    neuropathy    Family History  Problem Relation Age of Onset   Diabetes Mother    Hypertension Mother    Diabetes Father    Hypertension Father    Cancer Brother        brain    Past Surgical History:  Procedure Laterality Date   AMPUTATION Left 12/10/2020   Procedure: LEFT GREAT TOE AMPUTATION;  Surgeon: Newt Minion, MD;  Location: [facility];  Service: Orthopedics;  Laterality: Left;   AMPUTATION Left 01/21/2021   Procedure: 1ST AND 2ND RAY AMPUTATION LEFT FOOT;  Surgeon: Newt Minion, MD;  Location: [facility];  Service: Orthopedics;  Laterality: Left;   AMPUTATION Left 03/18/2021   Procedure: LEFT BELOW KNEE AMPUTATION;  Surgeon: Newt Minion, MD;  Location: [facility];  Service: Orthopedics;   Laterality: Left;   AMPUTATION TOE Left 06/23/2019   Procedure: AMPUTATION TOE LEFT AMPUTATION;  Surgeon: Sharlotte Alamo, DPM;  Location: [facility];  Service: Podiatry;  Laterality: Left;   AMPUTATION TOE Left 08/31/2019   Procedure: AMPUTATION TOE  IPJ 44010;  Surgeon: Sharlotte Alamo, DPM;  Location: [facility];  Service: Podiatry;  Laterality: Left;   CYSTOSCOPY  2015   Biopsy   HERNIA REPAIR Right 2008   [place]   REVERSE SHOULDER ARTHROPLASTY Right 09/18/2020   Procedure: RIGHT REVERSE SHOULDER ARTHROPLASTY;  Surgeon: Meredith Pel, MD;  Location: [facility];  Service: Orthopedics;  Laterality: Right;   Social History   Occupational History   Occupation: unemployed  Tobacco Use   Smoking status: Every Day    Packs/day: 0.25    Years: 43.00    Pack years: 10.75    Types: Cigarettes   Smokeless tobacco: Never   Tobacco comments:    cut  down to quarter pack a day, aware of resources  Vaping Use   Vaping Use: Never used  Substance and Sexual Activity   Alcohol use: Not Currently    Alcohol/week: 2.0 standard drinks    Types: 2 Cans of beer per week    Comment: last use May 2022   Drug use: Not Currently    Frequency: 7.0 times per week    Types: Marijuana    Comment: last use was 06/04/2021   Sexual activity: Yes

## 2021-07-18 LAB — CBC WITH DIFFERENTIAL/PLATELET
Absolute Monocytes: 1029 cells/uL — ABNORMAL HIGH (ref 200–950)
Basophils Absolute: 88 cells/uL (ref 0–200)
Basophils Relative: 0.6 %
Eosinophils Absolute: 294 cells/uL (ref 15–500)
Eosinophils Relative: 2 %
HCT: 51.2 % — ABNORMAL HIGH (ref 38.5–50.0)
Hemoglobin: 17.3 g/dL — ABNORMAL HIGH (ref 13.2–17.1)
Lymphs Abs: 3175 cells/uL (ref 850–3900)
MCH: 29.9 pg (ref 27.0–33.0)
MCHC: 33.8 g/dL (ref 32.0–36.0)
MCV: 88.6 fL (ref 80.0–100.0)
MPV: 10.9 fL (ref 7.5–12.5)
Monocytes Relative: 7 %
Neutro Abs: 10114 cells/uL — ABNORMAL HIGH (ref 1500–7800)
Neutrophils Relative %: 68.8 %
Platelets: 356 10*3/uL (ref 140–400)
RBC: 5.78 10*6/uL (ref 4.20–5.80)
RDW: 14.5 % (ref 11.0–15.0)
Total Lymphocyte: 21.6 %
WBC: 14.7 10*3/uL — ABNORMAL HIGH (ref 3.8–10.8)

## 2021-07-18 LAB — C-REACTIVE PROTEIN: CRP: 8.3 mg/L — ABNORMAL HIGH (ref <8.0)

## 2021-07-18 LAB — SEDIMENTATION RATE: Sed Rate: 2 mm/h (ref 0–20)

## 2021-07-22 ENCOUNTER — Ambulatory Visit: Payer: Medicaid Other | Admitting: Gerontology

## 2021-07-28 ENCOUNTER — Telehealth: Payer: Self-pay | Admitting: Orthopedic Surgery

## 2021-07-28 NOTE — Telephone Encounter (Addendum)
Patient called advised he is in a lot of pain and need to know the results of his lab work and the CT scan. Patient asked if something can be called into the pharmacy for him to help with the pain? Patient said he is waiting by the phone for a call back. The number to contact patient is [phone]

## 2021-07-28 NOTE — Telephone Encounter (Signed)
Pt called again

## 2021-07-28 NOTE — Telephone Encounter (Signed)
Did he ever get set up for joint aspiration?

## 2021-07-29 ENCOUNTER — Other Ambulatory Visit: Payer: Self-pay | Admitting: Surgical

## 2021-07-29 MED ORDER — HYDROCODONE-ACETAMINOPHEN 5-325 MG PO TABS: 1.0000 | ORAL_TABLET | Freq: Two times a day (BID) | ORAL | 0 refills | Status: DC | PRN

## 2021-07-29 NOTE — Telephone Encounter (Signed)
This was not set up from what I can see on my end. Please advise who patient will need to see to have this preformed and I can get this scheduled.

## 2021-07-30 ENCOUNTER — Other Ambulatory Visit: Payer: Self-pay

## 2021-07-30 DIAGNOSIS — M25511 Pain in right shoulder: Secondary | ICD-10-CM

## 2021-07-31 ENCOUNTER — Encounter (HOSPITAL_COMMUNITY): Payer: Self-pay | Admitting: Radiology

## 2021-07-31 ENCOUNTER — Telehealth: Payer: Self-pay | Admitting: Orthopedic Surgery

## 2021-07-31 NOTE — Telephone Encounter (Signed)
Pt called requesting a call back from Dr. August Saucer, PA Franky Macho, or Kathryne Gin. With his blood culture results. Please call pt at [phone].

## 2021-07-31 NOTE — Progress Notes (Signed)
Patient Name  Justin Landry, Justin Landry Legal Sex  Male DOB  January 01, 1961 SSN  [id number] Address  [address]  [place]  [place] [address] Phone  [phone] [facility])  [phone] (Mobile) *Preferred*    RE: Question order Received: Today Simonne Come, MD  Henry Russel D OK for attempted US guided aspiration.   No sedation.   Spoke with Dr. Purcell Mouton - he's note even sure if this "collection" is real.  He said it's not in the joint so no point in doing a joint aspiration.  He said to look at that area with [place] - if there's fluid we can try and aspirate and if there's none then we are done.   Nedra Hai        Previous Messages   ----- Message -----  From: Henry Russel D  Sent: 07/31/2021  11:10 AM EDT  To: Ir Procedure Requests  Subject: Question order                                 Procedure:  order in epic states (DL FLUORO GUIDED NEEDLE PLC ASPIRATION / INJECTTION/LOC)   Order notes states (US Aspiration)   Reason:  Right shoulder pain, unspecified chronicity, ultrasound aspiration of right shoulder by musculoskeletal ultrasound,  ultrasound aspiration of right shoulder by musculoskeletal ultrasound- Needs synovial fluid analysis (cell count), gram stain, anaerobic/aerobic culture and additional culture on broth media. Cultures need to be for 2-3 weeks for investigation of C. acnes infection   History:  CT, xrays in computer   Provider:  Cammy Copa   Provider Contact:  [phone]

## 2021-08-05 ENCOUNTER — Telehealth: Payer: Self-pay | Admitting: Surgical

## 2021-08-05 ENCOUNTER — Other Ambulatory Visit: Payer: Self-pay | Admitting: Psychiatry

## 2021-08-05 ENCOUNTER — Other Ambulatory Visit: Payer: Self-pay | Admitting: Orthopedic Surgery

## 2021-08-05 DIAGNOSIS — M25511 Pain in right shoulder: Secondary | ICD-10-CM

## 2021-08-05 NOTE — Telephone Encounter (Signed)
Patient called asked if he can get a sooner appointment for the aspiration. Patient said he can not hold out until 08/11/2021. Patient also said he is out of his pain medication. Patient said he is in so much pain and very hard to tolerate it. Patient said the Hydrocodone did not help much with the pain. Patient said he is out of the pain medication.    [phone]

## 2021-08-05 NOTE — Telephone Encounter (Signed)
I called over to [facility] imaging and sw Marla Roe and stated can do the imaging as a Flouro but not as [place]. I went and spoke with Dr. August Saucer and he is ok with the Flouro.  Called Cathy back and left message on vm informing of this.

## 2021-08-05 NOTE — Telephone Encounter (Signed)
Thank you Sabrina

## 2021-08-06 ENCOUNTER — Telehealth: Payer: Self-pay | Admitting: Orthopedic Surgery

## 2021-08-06 NOTE — Telephone Encounter (Signed)
called pt back and he informed me [facility] imaging could not do the Flouro and did not understand why, states he can not take to much of this and the pain. I told pt I will contact Cathy at [facility] imaging and see what the reason was. Lynden Ang explained that after we got off phone she had the radiologist to review and it and he aid that flouro was unacceptable cause it woiuld be at the joint and would  prefer the pt to have an Ultrasound guided needle done instead.   I called pt and explained everything in detail and stated he will keep the appt that is already scheduled at [facility] for the [place].   Kathryne Gin- pt wants something for the pain until he goes for his ultrasound on Aug [facility] [place]

## 2021-08-06 NOTE — Telephone Encounter (Signed)
Patient called asked if he can get a call back concerning the pain medicine he requested. Patient said he can not tolerate the pain and that it's really bad.  The number to contact patient is [phone]

## 2021-08-06 NOTE — Telephone Encounter (Signed)
Pt called asking for a CB in regards to getting the aspiration set up. The pt states he spoke with Lynden Ang and was told he would have to go to [facility] and he is very confused. Pt would like a CB to discuss further.   [phone]

## 2021-08-06 NOTE — Telephone Encounter (Signed)
Ok to rf what he had thx

## 2021-08-07 ENCOUNTER — Other Ambulatory Visit: Payer: Self-pay | Admitting: Orthopedic Surgery

## 2021-08-07 MED ORDER — HYDROCODONE-ACETAMINOPHEN 5-325 MG PO TABS: 1.0000 | ORAL_TABLET | Freq: Two times a day (BID) | ORAL | 0 refills | Status: DC | PRN

## 2021-08-07 NOTE — Telephone Encounter (Signed)
You sent meds. Patient advised done.

## 2021-08-11 ENCOUNTER — Other Ambulatory Visit: Payer: Self-pay

## 2021-08-12 ENCOUNTER — Ambulatory Visit (HOSPITAL_COMMUNITY)
Admission: RE | Admit: 2021-08-12 | Discharge: 2021-08-12 | Disposition: A | Discharge status: Home / Self Care | Payer: Medicaid Other | Source: Ambulatory Visit | Attending: Orthopedic Surgery | Admitting: Orthopedic Surgery

## 2021-08-12 ENCOUNTER — Other Ambulatory Visit: Payer: Self-pay | Admitting: Orthopedic Surgery

## 2021-08-12 ENCOUNTER — Telehealth: Payer: Self-pay | Admitting: Orthopedic Surgery

## 2021-08-12 ENCOUNTER — Other Ambulatory Visit: Payer: Self-pay

## 2021-08-12 DIAGNOSIS — M25511 Pain in right shoulder: Secondary | ICD-10-CM

## 2021-08-12 MED ORDER — LIDOCAINE HCL (PF) 1 % IJ SOLN
INTRAMUSCULAR | Status: AC
  Filled 2021-08-12: qty 30

## 2021-08-12 NOTE — Telephone Encounter (Signed)
Pt called stating he is having a procedure to draw the fluid off his shoulder and he would like to know should he take his BP rx and metformin rx? Pt would like a CB   [phone]

## 2021-08-12 NOTE — Telephone Encounter (Signed)
Pt called requesting pain medication or be admitted in hospital for pain in shoulder. Pt states he went to hospital to have fluid removed from shoulder and there is no fluid in shoulder. Please call pt about this matter at [phone].

## 2021-08-12 NOTE — Telephone Encounter (Signed)
Contacted patient to discuss concerns or questions however patient states that he will need another surgery to fix the pain that is in his shoulder. Patient is requesting pain medication or a call back.

## 2021-08-12 NOTE — Telephone Encounter (Signed)
See other message. Franky Macho will reach out to patient.

## 2021-08-12 NOTE — Procedures (Signed)
Vascular and Interventional Radiology Procedure Note  Patient: Justin Landry DOB: 1961-04-27 Medical Record Number: [id number] Note Date/Time: 08/12/21 4:43 PM   Performing Physician: Roanna Banning, MD Assistant(s): None  Diagnosis: Suspect peri-prosthetic collection  Procedure:  LIMITED ULTRASOUND OF THE RIGHT SHOULDER ABORTED ASPIRATION OF SUSPECTED PERIPROSTHETIC COLLECTION  Anesthesia:  None Complications: None Estimated Blood Loss:  0 mL Specimens: Sent for None  Findings:  Imaging and report reviewed. R shoulder peri-prosthetic collection suspected, appears artifactual. Limited US evaluation at the area of concern. No sonographic evidence of periprosthetic fluid collection. The procedure was aborted.  Plan:  Pt is to follow up with his Orthopedist for further management.  See detailed procedure note with images in PACS. The patient tolerated the procedure well without incident or complication and was returned to Short Stay in stable condition.    Roanna Banning, MD Vascular and Interventional Radiology [facility] Rad[facility]   Pager. [phone] Clinic. [phone]

## 2021-08-13 NOTE — Telephone Encounter (Signed)
Called patient last night and this morning. Aspiration failed yesterday so plan to have him return to office tomorrow for injection and subsequent aspiration of sterile saline into the joint with subsequent culture and synovial fluid analysis.  He will call transportation this morning and then call the office back with what time he will be able to make it to the office tomorrow.  Recommended he come that 830 or 9 AM due to surgery scheduled for 10 AM.

## 2021-08-14 ENCOUNTER — Ambulatory Visit: Payer: Self-pay

## 2021-08-14 ENCOUNTER — Ambulatory Visit (INDEPENDENT_AMBULATORY_CARE_PROVIDER_SITE_OTHER): Payer: Medicaid Other | Admitting: Orthopedic Surgery

## 2021-08-14 ENCOUNTER — Other Ambulatory Visit: Payer: Self-pay

## 2021-08-14 DIAGNOSIS — M25511 Pain in right shoulder: Secondary | ICD-10-CM

## 2021-08-14 MED ORDER — HYDROCODONE-ACETAMINOPHEN 10-325 MG PO TABS: 1.0000 | ORAL_TABLET | Freq: Every evening | ORAL | 0 refills | Status: DC | PRN

## 2021-08-14 NOTE — Progress Notes (Signed)
Office Visit Note   Patient: Justin Landry           Date of Birth: 1961/03/02           MRN: [id number] Visit Date: 08/14/2021 Requested by: Rolm Gala, NP [address] [place],  [place] [address] PCP: Rolm Gala, NP  Subjective: Chief Complaint  Patient presents with   Right Shoulder - Pain    HPI: Cort is a 60 year old patient who underwent right shoulder reverse replacement approximately 4 to 5 months ago.  He has been having some pain.  In the interim since his shoulder replacement he did undergo amputation.  He is not having any fevers or chills but he does report increasing right shoulder pain.  Radiographs have been negative for any type of prosthetic loosening.  He also reports 3 falls since he was last seen.  He is requesting pain medicine today.              ROS: All systems reviewed are negative as they relate to the chief complaint within the history of present illness.  Patient denies  fevers or chills.   Assessment & Plan: Visit Diagnoses:  1. Right shoulder pain, unspecified chronicity     Plan: Impression is right shoulder pain with no definable fluid collection in the shoulder joint.  He is healing BKA stump will throw off CBC DIF sed rate C-reactive protein evaluation for possible infection.  CT scan and plain radiographs showed no prosthetic loosening or fluid collections.  We injected some saline sterilely under ultrasound guidance today.  That was done after sterile prepping and draping with alcohol Betadine and ChloraPrep solution.  No fluid collection was aspirated.  We did get a few drops of blood on the way out which we sent for culture.  At this time no indication for any type of prosthetic exploration based on available data.  Follow-up in 8 weeks for clinical recheck.  One-time prescription for pain medicine provided.  Follow-Up Instructions: Return in about 8 weeks (around 10/09/2021).   Orders:  Orders Placed This  Encounter  Procedures   Anaerobic and Aerobic Culture   US Guided Needle Placement - No Linked Charges   Meds ordered this encounter  Medications   HYDROcodone-acetaminophen (NORCO) 10-325 MG tablet    Sig: Take 1 tablet by mouth at bedtime as needed.    Dispense:  20 tablet    Refill:  0      Procedures: Large Joint Inj: R glenohumeral on 08/17/2021 8:06 PM Indications: diagnostic evaluation and pain Details: 18 G 1.5 in needle, ultrasound-guided superolateral approach  Arthrogram: No  Medications: 5 mL lidocaine 1 % Outcome: tolerated well, no immediate complications Procedure, treatment alternatives, risks and benefits explained, specific risks discussed. Consent was given by the patient. Immediately prior to procedure a time out was called to verify the correct patient, procedure, equipment, support staff and site/side marked as required. Patient was prepped and draped in the usual sterile fashion.      Clinical Data: No additional findings.  Objective: Vital Signs: There were no vitals taken for this visit.  Physical Exam:   Constitutional: Patient appears well-developed HEENT:  Head: Normocephalic Eyes:EOM are normal Neck: Normal range of motion Cardiovascular: Normal rate Pulmonary/chest: Effort normal Neurologic: Patient is alert Skin: Skin is warm Psychiatric: Patient has normal mood and affect   Ortho Exam: Ortho exam demonstrates very functional shoulder range of motion particularly when I observed him taking his shirt  off.  No lymphadenopathy warmth erythema or incisional problems present.  He has mild pain with passive range of motion but no coarseness grinding or mechanical symptoms.  Deltoid is functional.  Specialty Comments:  No specialty comments available.  Imaging: No results found.   PMFS History: Patient Active Problem List   Diagnosis Date Noted   Type 2 diabetes mellitus with other specified complication (HCC) 04/21/2021   Acute  osteomyelitis of left foot (HCC) 03/18/2021   Cutaneous abscess of left foot    Dehiscence of amputation stump (HCC)    Subacute osteomyelitis of left foot (HCC)    Rupture of operation wound    Subacute osteomyelitis, left ankle and foot (HCC)    Overgrown toenails 10/30/2020   Pre-operative clearance 09/16/2020   Abnormal EKG 09/16/2020   Chronic left shoulder pain 04/10/2020   Toe pain 02/21/2020   Anxiety 02/08/2020   Left knee pain 02/07/2020   Inguinodynia, right 11/13/2019   Elevated lipids 10/04/2019   Diabetic ulcer of toe of left foot associated with diabetes mellitus due to underlying condition (HCC) 08/23/2019   History of right inguinal hernia 08/14/2019   Encounter to establish care 08/14/2019   Anxiety 08/14/2019   Right shoulder pain 08/14/2019   Cellulitis 06/22/2019   Essential hypertension 05/27/2015   Type 2 diabetes mellitus without complication (HCC) 05/27/2015   Diverticulosis of large intestine without hemorrhage 10/16/2014   Recurrent right inguinal hernia 09/27/2014   Tobacco use 08/01/2014   Hematuria, gross 07/03/2014   Lower urinary tract symptoms (LUTS) 07/03/2014   Diabetes mellitus (HCC) 06/25/2014   Inguinal hernia, bilateral  04/10/2014   Past Medical History:  Diagnosis Date   Arthritis    right shoulder   Depression    Diabetes mellitus    Type II   Headache    migraines   High cholesterol    Humerus fracture    right   Hypertension    Inguinal hernia    Neuromuscular disorder (HCC)    neuropathy    Family History  Problem Relation Age of Onset   Diabetes Mother    Hypertension Mother    Diabetes Father    Hypertension Father    Cancer Brother        brain    Past Surgical History:  Procedure Laterality Date   AMPUTATION Left 12/10/2020   Procedure: LEFT GREAT TOE AMPUTATION;  Surgeon: [name], MD;  [facility];  Service: Orthopedics;  Laterality: Left;   AMPUTATION Left 01/21/2021   Procedure: 1ST AND 2ND RAY  AMPUTATION LEFT FOOT;  Surgeon: [name], MD;  Location[facility];  Service: Orthopedics;  Laterality: Left;   AMPUTATION Left 03/18/2021   Procedure: LEFT BELOW KNEE AMPUTATION;  Surgeon: [name], MD;  Location: [facility];  Service: Orthopedics;  Laterality: Left;   AMPUTATION TOE Left 06/23/2019   Procedure: AMPUTATION TOE LEFT AMPUTATION;  Surgeon: Linus Galas, DPM;  Location: [facility];  Service: Podiatry;  Laterality: Left;   AMPUTATION TOE Left 08/31/2019   Procedure: AMPUTATION TOE  IPJ 00370;  Surgeon: Linus Galas, DPM;  Location: [facility];  Service: Podiatry;  Laterality: Left;   CYSTOSCOPY  2015   Biopsy   HERNIA REPAIR Right 2008   [place]   REVERSE SHOULDER ARTHROPLASTY Right 09/18/2020   Procedure: RIGHT REVERSE SHOULDER ARTHROPLASTY;  Surgeon: Cammy Copa, MD;  Location: [facility];  Service: Orthopedics;  Laterality: Right;   Social History   Occupational History  Occupation: unemployed  Tobacco Use   Smoking status: Every Day    Packs/day: 0.25    Years: 43.00    Pack years: 10.75    Types: Cigarettes   Smokeless tobacco: Never   Tobacco comments:    cut down to quarter pack a day, aware of resources  Vaping Use   Vaping Use: Never used  Substance and Sexual Activity   Alcohol use: Not Currently    Alcohol/week: 2.0 standard drinks    Types: 2 Cans of beer per week    Comment: last use May 2022   Drug use: Not Currently    Frequency: 7.0 times per week    Types: Marijuana    Comment: last use was 06/04/2021   Sexual activity: Yes

## 2021-08-17 ENCOUNTER — Encounter: Payer: Self-pay | Admitting: Orthopedic Surgery

## 2021-08-17 DIAGNOSIS — M25511 Pain in right shoulder: Secondary | ICD-10-CM

## 2021-08-17 MED ORDER — LIDOCAINE HCL 1 % IJ SOLN
5.0000 mL | INTRAMUSCULAR | Status: AC | PRN
  Administered 2021-08-17: 5 mL

## 2021-08-19 ENCOUNTER — Telehealth: Payer: Self-pay | Admitting: Orthopedic Surgery

## 2021-08-19 ENCOUNTER — Other Ambulatory Visit: Payer: Self-pay

## 2021-08-19 NOTE — Telephone Encounter (Signed)
I called pt to advise that this has been done. He states that he is having continued pain of his tailbone s/p a fall back in May and is not able to tolerate the pain anymore and needed an appt for eval. He is available on Friday this was scheduled will repeat xray's at that visit.

## 2021-08-19 NOTE — Telephone Encounter (Signed)
Received incomplete release/request for records. Mailed to patient to fill in all highlighted areas and advised to return the completed form to office to be processed.

## 2021-08-19 NOTE — Telephone Encounter (Signed)
Pt called stating he is trying to set up transportation through social services and needs a referral. Pt states this ride would be to get him to [facility]; and would like [place] to fax the referral to social services. Pt would also like to be notified when that's been sent please.   Fax# [phone]

## 2021-08-20 LAB — ANAEROBIC AND AEROBIC CULTURE
AER RESULT:: NO GROWTH
MICRO NUMBER:: 12327660
MICRO NUMBER:: 12327661
SPECIMEN QUALITY:: ADEQUATE
SPECIMEN QUALITY:: ADEQUATE

## 2021-08-21 ENCOUNTER — Ambulatory Visit (INDEPENDENT_AMBULATORY_CARE_PROVIDER_SITE_OTHER): Payer: Medicaid Other

## 2021-08-21 ENCOUNTER — Ambulatory Visit (INDEPENDENT_AMBULATORY_CARE_PROVIDER_SITE_OTHER): Payer: Medicaid Other | Admitting: Family

## 2021-08-21 ENCOUNTER — Other Ambulatory Visit: Payer: Self-pay

## 2021-08-21 ENCOUNTER — Telehealth: Payer: Self-pay | Admitting: Family

## 2021-08-21 ENCOUNTER — Encounter: Payer: Self-pay | Admitting: Family

## 2021-08-21 DIAGNOSIS — G8929 Other chronic pain: Secondary | ICD-10-CM

## 2021-08-21 DIAGNOSIS — M533 Sacrococcygeal disorders, not elsewhere classified: Secondary | ICD-10-CM

## 2021-08-21 DIAGNOSIS — M545 Low back pain, unspecified: Secondary | ICD-10-CM | POA: Diagnosis not present

## 2021-08-21 MED ORDER — PREDNISONE 50 MG PO TABS: ORAL_TABLET | ORAL | 0 refills | Status: DC

## 2021-08-21 MED ORDER — PREDNISONE 50 MG PO TABS
ORAL_TABLET | ORAL | 0 refills | Status: DC
  Filled 2021-08-21: qty 5, 5d supply, fill #0

## 2021-08-21 NOTE — Telephone Encounter (Signed)
Pt called about medications. Pt states he was told he would need muscle relaxer and steroid meds. Please call pt about this matter at [phone].

## 2021-08-21 NOTE — Telephone Encounter (Signed)
I called pt and advised that prednisone 50 mg 1 po qd x 5 days has been sent to pharm per Erin. [facility] [place]

## 2021-08-21 NOTE — Progress Notes (Signed)
Office Visit Note   Patient: Justin Landry           Date of Birth: 05-26-1961           MRN: [id number] Visit Date: 08/21/2021              Requested by: Rolm Gala, NP [address] [place],  [place] [address] PCP: Rolm Gala, NP  No chief complaint on file.     HPI: The patient is a 60 year old gentleman who is seen today for ongoing issues with tailbone and low back pain.  He was initially evaluated for this May 2.  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  States he has pain primarily on the left side with prolonged sitting he does have pain when he gets to a standing position.  Pain in his buttocks and low but it does radiate down his buttocks some into his thigh  No weakness no numbness no loss of bowel or bladder  Assessment & Plan: Visit Diagnoses:  1. Tail bone pain   2. Chronic midline low back pain without sciatica     Plan: Recommended triangle a prednisone burst for radiculopathy.  May need to send for MRI if no relief from prednisone.  Patient did call several days later states he could not tolerate the prednisone due to hyperactivity.  Follow-Up Instructions: No follow-ups on file.   Back Exam   Tenderness  The patient is experiencing tenderness in the lumbar.  Range of Motion  The patient has normal back ROM.  Tests  Straight leg raise right: positive Straight leg raise left: positive     Patient is alert, oriented, no adenopathy, well-dressed, normal affect, normal respiratory effort.   Imaging: No results found. No images are attached to the encounter.  Labs: Lab Results  Component Value Date   HGBA1C 6.3 (A) 07/08/2021   HGBA1C 7.5 (H) 03/18/2021   HGBA1C 7.5 (H) 09/24/2020   ESRSEDRATE 2 07/17/2021   CRP 8.3 (H) 07/17/2021   REPTSTATUS 09/11/2020 FINAL 09/10/2020   GRAMSTAIN  06/23/2019    FEW WBC  PRESENT, PREDOMINANTLY PMN RARE GRAM POSITIVE COCCI    CULT (A) 09/10/2020    <10,000 COLONIES/mL INSIGNIFICANT GROWTH Performed at [facility], [address], [place], [place] [address]    LABORGA LECLERCIA ADECARBOXYLATA 06/23/2019   LABORGA STAPHYLOCOCCUS CAPRAE 06/23/2019   LABORGA STAPHYLOCOCCUS SIMULANS 06/23/2019     Lab Results  Component Value Date   ALBUMIN 4.2 09/10/2020   ALBUMIN 4.5 07/14/2020   ALBUMIN 4.5 06/25/2020    No results found for: MG No results found for: VD25OH  No results found for: PREALBUMIN CBC EXTENDED Latest Ref Rng & Units 07/17/2021 03/20/2021 03/19/2021  WBC 3.8 - 10.8 Thousand/uL 14.7(H) 12.1(H) 14.3(H)  RBC 4.20 - 5.80 Million/uL 5.78 4.56 4.27  HGB 13.2 - 17.1 g/dL 17.3(H) 13.3 12.7(L)  HCT 38.5 - 50.0 % 51.2(H) 38.9(L) 37.5(L)  PLT 140 - 400 Thousand/uL 356 395 392  NEUTROABS 1,500 - 7,800 cells/uL 10,114(H) - -  LYMPHSABS 850 - 3,900 cells/uL 3,175 - -     There is no height or weight on file to calculate BMI.  Orders:  Orders Placed This Encounter  Procedures   XR Sacrum/Coccyx   XR Lumbar Spine 2-3 Views   No orders of the defined types were placed in this encounter.  Procedures: No procedures performed  Clinical Data: No additional findings.  ROS:  All other systems negative, except as noted in the HPI. Review of Systems  Objective: Vital Signs: There were no vitals taken for this visit.  Specialty Comments:  No specialty comments available.  PMFS History: Patient Active Problem List   Diagnosis Date Noted   Type 2 diabetes mellitus with other specified complication (HCC) 04/21/2021   Acute osteomyelitis of left foot (HCC) 03/18/2021   Cutaneous abscess of left foot    Dehiscence of amputation stump (HCC)    Subacute osteomyelitis of left foot (HCC)    Rupture of operation wound    Subacute osteomyelitis, left ankle and foot (HCC)    Overgrown toenails 10/30/2020   Pre-operative clearance  09/16/2020   Abnormal EKG 09/16/2020   Chronic left shoulder pain 04/10/2020   Toe pain 02/21/2020   Anxiety 02/08/2020   Left knee pain 02/07/2020   Inguinodynia, right 11/13/2019   Elevated lipids 10/04/2019   Diabetic ulcer of toe of left foot associated with diabetes mellitus due to underlying condition (HCC) 08/23/2019   History of right inguinal hernia 08/14/2019   Encounter to establish care 08/14/2019   Anxiety 08/14/2019   Right shoulder pain 08/14/2019   Cellulitis 06/22/2019   Essential hypertension 05/27/2015   Type 2 diabetes mellitus without complication (HCC) 05/27/2015   Diverticulosis of large intestine without hemorrhage 10/16/2014   Recurrent right inguinal hernia 09/27/2014   Tobacco use 08/01/2014   Hematuria, gross 07/03/2014   Lower urinary tract symptoms (LUTS) 07/03/2014   Diabetes mellitus (HCC) 06/25/2014   Inguinal hernia, bilateral  04/10/2014   Past Medical History:  Diagnosis Date   Arthritis    right shoulder   Depression    Diabetes mellitus    Type II   Headache    migraines   High cholesterol    Humerus fracture    right   Hypertension    Inguinal hernia    Neuromuscular disorder (HCC)    neuropathy    Family History  Problem Relation Age of Onset   Diabetes Mother    Hypertension Mother    Diabetes Father    Hypertension Father    Cancer Brother        brain    Past Surgical History:  Procedure Laterality Date   AMPUTATION Left 12/10/2020   Procedure: LEFT GREAT TOE AMPUTATION;  Surgeon: [name], MD;  Location: [facility] OR;  Service: Orthopedics;  Laterality: Left;   AMPUTATION Left 01/21/2021   Procedure: 1ST AND 2ND RAY AMPUTATION LEFT FOOT;  Surgeon: [name], MD;  Location: [facility] OR;  Service: Orthopedics;  Laterality: Left;   AMPUTATION Left 03/18/2021   Procedure: LEFT BELOW KNEE AMPUTATION;  Surgeon: [name], MD;  Location: [facility] OR;  Service: Orthopedics;  Laterality: Left;   AMPUTATION TOE Left 06/23/2019    Procedure: AMPUTATION TOE LEFT AMPUTATION;  Surgeon: Linus Galas, DPM;  Location: [facility] ORS;  Service: Podiatry;  Laterality: Left;   AMPUTATION TOE Left 08/31/2019   Procedure: AMPUTATION TOE  IPJ [id number];  Surgeon: Linus Galas, DPM;  Location: [facility] ORS;  Service: Podiatry;  Laterality: Left;   CYSTOSCOPY  2015   Biopsy   HERNIA REPAIR Right 2008   [place]   REVERSE SHOULDER ARTHROPLASTY Right 09/18/2020   Procedure: RIGHT REVERSE SHOULDER ARTHROPLASTY;  Surgeon: Cammy Copa, MD;  Location[facility] OR;  Service: Orthopedics;  Laterality: Right;   Social History   Occupational History  Occupation: unemployed  Tobacco Use   Smoking status: Every Day    Packs/day: 0.25    Years: 43.00    Pack years: 10.75    Types: Cigarettes   Smokeless tobacco: Never   Tobacco comments:    cut down to quarter pack a day, aware of resources  Vaping Use   Vaping Use: Never used  Substance and Sexual Activity   Alcohol use: Not Currently    Alcohol/week: 2.0 standard drinks    Types: 2 Cans of beer per week    Comment: last use May 2022   Drug use: Not Currently    Frequency: 7.0 times per week    Types: Marijuana    Comment: last use was 06/04/2021   Sexual activity: Yes

## 2021-08-24 ENCOUNTER — Other Ambulatory Visit: Payer: Self-pay

## 2021-08-24 ENCOUNTER — Telehealth: Payer: Self-pay | Admitting: Family

## 2021-08-24 NOTE — Telephone Encounter (Signed)
Pt states that he can not take prednisone. He can't sleep on that medication and he is a nervous wreck. He would like to know if he could get something else called in.   [phone]

## 2021-08-24 NOTE — Telephone Encounter (Signed)
Please see below and advise.

## 2021-08-25 ENCOUNTER — Telehealth: Payer: Self-pay | Admitting: Orthopedic Surgery

## 2021-08-25 ENCOUNTER — Other Ambulatory Visit: Payer: Self-pay | Admitting: Family

## 2021-08-25 MED ORDER — HYDROCODONE-ACETAMINOPHEN 10-325 MG PO TABS: 1.0000 | ORAL_TABLET | Freq: Three times a day (TID) | ORAL | 0 refills | Status: DC | PRN

## 2021-08-25 NOTE — Telephone Encounter (Signed)
Pt called and said he can not take prednisone. Pt states it keeps him up at night. He also stated it did nothing for his pain. Pt is asking for a different pain medication. Please call pt about this matter at [phone].

## 2021-08-25 NOTE — Addendum Note (Signed)
Addended by: Barnie Del R on: 08/25/2021 09:14 AM   Modules accepted: Orders

## 2021-08-26 ENCOUNTER — Other Ambulatory Visit: Payer: Self-pay | Admitting: Physician Assistant

## 2021-08-26 NOTE — Telephone Encounter (Signed)
Erin called and sw pt this has been addressed.

## 2021-09-01 ENCOUNTER — Other Ambulatory Visit: Payer: Self-pay

## 2021-09-04 ENCOUNTER — Other Ambulatory Visit: Payer: Self-pay

## 2021-09-08 ENCOUNTER — Telehealth: Payer: Self-pay | Admitting: Family

## 2021-09-08 NOTE — Telephone Encounter (Signed)
Pt called wanting to know if he could have something called in for his back pain? Pt has an appt for 09/11/21.  [phone]

## 2021-09-09 NOTE — Telephone Encounter (Signed)
FYI Called pt and advised that looking at the last 4 weeks he has had a total of 56 Hydrocodone and this is now a chronic pain issue and will have to address at his appt on Friday what the next steps are

## 2021-09-09 NOTE — Telephone Encounter (Signed)
Pt called to check on wether he was going to be able to have something called in. He would like a CB to be updated please.   [phone]

## 2021-09-09 NOTE — Telephone Encounter (Signed)
Pt called in stating that he spoke with a friend of his that has rods in her back and she told him to try flexeril. He was wondering if he could have that called in to take the edge off until Friday. I informed the patient per Autumn that we can not address any medication until his apt on Friday.

## 2021-09-11 ENCOUNTER — Ambulatory Visit: Payer: Medicaid Other | Admitting: Family

## 2021-09-15 ENCOUNTER — Encounter: Payer: Self-pay | Admitting: Orthopedic Surgery

## 2021-09-15 ENCOUNTER — Ambulatory Visit (INDEPENDENT_AMBULATORY_CARE_PROVIDER_SITE_OTHER): Payer: Medicaid Other | Admitting: Orthopedic Surgery

## 2021-09-15 ENCOUNTER — Other Ambulatory Visit: Payer: Self-pay

## 2021-09-15 DIAGNOSIS — M545 Low back pain, unspecified: Secondary | ICD-10-CM | POA: Diagnosis not present

## 2021-09-15 DIAGNOSIS — G8929 Other chronic pain: Secondary | ICD-10-CM

## 2021-09-15 DIAGNOSIS — G6289 Other specified polyneuropathies: Secondary | ICD-10-CM | POA: Diagnosis not present

## 2021-09-15 DIAGNOSIS — Z89512 Acquired absence of left leg below knee: Secondary | ICD-10-CM

## 2021-09-15 MED ORDER — GABAPENTIN 400 MG PO CAPS: 400.0000 mg | ORAL_CAPSULE | Freq: Three times a day (TID) | ORAL | 2 refills | Status: DC

## 2021-09-15 NOTE — Progress Notes (Signed)
Office Visit Note   Patient: Justin Landry           Date of Birth: [birth year]           MRN: [id number] Visit Date: 09/15/2021              Requested by: Rolm Gala, NP [address] [place],  [place] [address] PCP: Rolm Gala, NP  Chief Complaint  Patient presents with   Lower Back - Pain    Coccyx pain      HPI: Patient is a 60 year old gentleman who is status post left transtibial amputation.  Patient complains of chronic lower back pain with spasms radiating to the buttocks bilaterally.  Patient states he [facility] to a [facility] in [place] and is supposed to be on Neurontin but his prescription has not been refilled.  Patient states he has had a hyperactive reaction to prednisone and is not interested in prednisone.  Patient states he has been trying to get transportation to get him to [facility] for his prosthetic fitting but he has been unable to get transportation.  Patient is 6 months status post left transtibial amputation.  Assessment & Plan: Visit Diagnoses:  1. Chronic midline low back pain without sciatica   2. Other polyneuropathy     Plan: Patient will continue working on transportation to get to [facility].  I will call in a prescription for his Neurontin he will follow-up with his medical clinic next week.  Follow-Up Instructions: No follow-ups on file.   Ortho Exam  Patient is alert, oriented, no adenopathy, well-dressed, normal affect, normal respiratory effort. Examination of the left residual limb is nicely consolidated he has full range of motion of his knee the incision is well-healed there is no signs of infection.  Patient has a negative straight leg raise bilaterally no focal motor weakness.  Radiographs from September shows degenerative disc disease throughout the lumbar spine with no spondylolisthesis with calcification of the aorta 3 cm in diameter.  Imaging: No results found. No images are attached to the  encounter.  Labs: Lab Results  Component Value Date   HGBA1C 6.3 (A) 07/08/2021   HGBA1C 7.5 (H) 03/18/2021   HGBA1C 7.5 (H) 09/24/2020   ESRSEDRATE 2 07/17/2021   CRP 8.3 (H) 07/17/2021   REPTSTATUS 09/11/2020 FINAL 09/10/2020   GRAMSTAIN  06/23/2019    FEW WBC PRESENT, PREDOMINANTLY PMN RARE GRAM POSITIVE COCCI    CULT (A) 09/10/2020    <10,000 COLONIES/mL INSIGNIFICANT GROWTH Performed at [facility], [address], [place], [place] [address]    LABORGA LECLERCIA ADECARBOXYLATA 06/23/2019   LABORGA STAPHYLOCOCCUS CAPRAE 06/23/2019   LABORGA STAPHYLOCOCCUS SIMULANS 06/23/2019     Lab Results  Component Value Date   ALBUMIN 4.2 09/10/2020   ALBUMIN 4.5 07/14/2020   ALBUMIN 4.5 06/25/2020    No results found for: MG No results found for: VD25OH  No results found for: PREALBUMIN CBC EXTENDED Latest Ref Rng & Units 07/17/2021 03/20/2021 03/19/2021  WBC 3.8 - 10.8 Thousand/uL 14.7(H) 12.1(H) 14.3(H)  RBC 4.20 - 5.80 Million/uL 5.78 4.56 4.27  HGB 13.2 - 17.1 g/dL 17.3(H) 13.3 12.7(L)  HCT 38.5 - 50.0 % 51.2(H) 38.9(L) 37.5(L)  PLT 140 - 400 Thousand/uL 356 395 392  NEUTROABS 1,500 - 7,800 cells/uL 10,114(H) - -  LYMPHSABS 850 - 3,900 cells/uL 3,175 - -     There is no height or weight on file to calculate BMI.  Orders:  No orders  of the defined types were placed in this encounter.  Meds ordered this encounter  Medications   gabapentin (NEURONTIN) 400 MG capsule    Sig: Take 1 capsule (400 mg total) by mouth 3 (three) times daily.    Dispense:  90 capsule    Refill:  2     Procedures: No procedures performed  Clinical Data: No additional findings.  ROS:  All other systems negative, except as noted in the HPI. Review of Systems  Objective: Vital Signs: There were no vitals taken for this visit.  Specialty Comments:  No specialty comments available.  PMFS History: Patient Active Problem List   Diagnosis Date Noted   Type 2 diabetes mellitus  with other specified complication (HCC) 04/21/2021   Acute osteomyelitis of left foot (HCC) 03/18/2021   Cutaneous abscess of left foot    Dehiscence of amputation stump (HCC)    Subacute osteomyelitis of left foot (HCC)    Rupture of operation wound    Subacute osteomyelitis, left ankle and foot (HCC)    Overgrown toenails 10/30/2020   Pre-operative clearance 09/16/2020   Abnormal EKG 09/16/2020   Chronic left shoulder pain 04/10/2020   Toe pain 02/21/2020   Anxiety 02/08/2020   Left knee pain 02/07/2020   Inguinodynia, right 11/13/2019   Elevated lipids 10/04/2019   Diabetic ulcer of toe of left foot associated with diabetes mellitus due to underlying condition (HCC) 08/23/2019   History of right inguinal hernia 08/14/2019   Encounter to establish care 08/14/2019   Anxiety 08/14/2019   Right shoulder pain 08/14/2019   Cellulitis 06/22/2019   Essential hypertension 05/27/2015   Type 2 diabetes mellitus without complication (HCC) 05/27/2015   Diverticulosis of large intestine without hemorrhage 10/16/2014   Recurrent right inguinal hernia 09/27/2014   Tobacco use 08/01/2014   Hematuria, gross 07/03/2014   Lower urinary tract symptoms (LUTS) 07/03/2014   Diabetes mellitus (HCC) 06/25/2014   Inguinal hernia, bilateral  04/10/2014   Past Medical History:  Diagnosis Date   Arthritis    right shoulder   Depression    Diabetes mellitus    Type II   Headache    migraines   High cholesterol    Humerus fracture    right   Hypertension    Inguinal hernia    Neuromuscular disorder (HCC)    neuropathy    Family History  Problem Relation Age of Onset   Diabetes Mother    Hypertension Mother    Diabetes Father    Hypertension Father    Cancer Brother        brain    Past Surgical History:  Procedure Laterality Date   AMPUTATION Left 12/10/2020   Procedure: LEFT GREAT TOE AMPUTATION;  Surgeon: [name], MD;  Location: [facility] OR;  Service: Orthopedics;  Laterality:  Left;   AMPUTATION Left 01/21/2021   Procedure: 1ST AND 2ND RAY AMPUTATION LEFT FOOT;  Surgeon: [name], MD;  Location: [facility] OR;  Service: Orthopedics;  Laterality: Left;   AMPUTATION Left 03/18/2021   Procedure: LEFT BELOW KNEE AMPUTATION;  Surgeon: [name], MD;  Location: [facility] OR;  Service: Orthopedics;  Laterality: Left;   AMPUTATION TOE Left 06/23/2019   Procedure: AMPUTATION TOE LEFT AMPUTATION;  Surgeon: Linus Galas, DPM;  Location: [facility] ORS;  Service: Podiatry;  Laterality: Left;   AMPUTATION TOE Left 08/31/2019   Procedure: AMPUTATION TOE  IPJ [id number];  Surgeon: Linus Galas, DPM;  Location: [facility] ORS;  Service: Podiatry;  Laterality: Left;  CYSTOSCOPY  2015   Biopsy   HERNIA REPAIR Right 2008   [place]   REVERSE SHOULDER ARTHROPLASTY Right 09/18/2020   Procedure: RIGHT REVERSE SHOULDER ARTHROPLASTY;  Surgeon: Cammy Copa, MD;  Location: [facility] OR;  Service: Orthopedics;  Laterality: Right;   Social History   Occupational History   Occupation: unemployed  Tobacco Use   Smoking status: Every Day    Packs/day: 0.25    Years: 43.00    Pack years: 10.75    Types: Cigarettes   Smokeless tobacco: Never   Tobacco comments:    cut down to quarter pack a day, aware of resources  Vaping Use   Vaping Use: Never used  Substance and Sexual Activity   Alcohol use: Not Currently    Alcohol/week: 2.0 standard drinks    Types: 2 Cans of beer per week    Comment: last use May 2022   Drug use: Not Currently    Frequency: 7.0 times per week    Types: Marijuana    Comment: last use was 06/04/2021   Sexual activity: Yes

## 2021-09-17 ENCOUNTER — Telehealth: Payer: Self-pay | Admitting: Orthopedic Surgery

## 2021-09-17 NOTE — Telephone Encounter (Signed)
Message has been sent to [facility] awaiting instruction. Will sign off on this message as it is a duplicate.

## 2021-09-17 NOTE — Telephone Encounter (Signed)
Pt called requesting a different pain medication. Pt states gabapentin is not touching his pain.Please call pt about this matter. Phone number is [phone].

## 2021-09-17 NOTE — Telephone Encounter (Signed)
Pt states gabapentin is not working for his pain. Can you call something else in?   CB [phone]

## 2021-09-17 NOTE — Telephone Encounter (Signed)
Please see below. This pt was in to see Dr. Lajoyce Corners this week.

## 2021-09-18 NOTE — Telephone Encounter (Signed)
Pt called

## 2021-09-18 NOTE — Telephone Encounter (Signed)
Needs to give the gabapentin a couple weeks to see if this will provide any relief.   If no relief in 3 weeks will change to lyrica

## 2021-09-18 NOTE — Telephone Encounter (Signed)
Pt called back again. I told him you guys will call when or if medication is called in.  CB [phone]

## 2021-09-18 NOTE — Telephone Encounter (Signed)
Pt called back and I advised pt of what Denny Peon put in message. He said okay hung up and called back again. He is asking if he can get tramadol.   CB [phone]

## 2021-09-18 NOTE — Telephone Encounter (Signed)
Called and lm on vm to advise that must give Neurontin 3 week  (obtained the rx 09/15/21) and if not relief per Denny Peon can call and will change to Lyrica. To call with any questions.

## 2021-09-18 NOTE — Telephone Encounter (Signed)
Called and lm on vm to advise no to tramadol and to take the Neurontin 1 po up to tid and give this tine to see if this will work for him.

## 2021-09-18 NOTE — Telephone Encounter (Signed)
I called and lm on vm to advise of message below. To call with questions.  

## 2021-09-18 NOTE — Telephone Encounter (Signed)
No to flexeril

## 2021-09-21 ENCOUNTER — Encounter: Payer: Self-pay | Admitting: General Surgery

## 2021-09-23 ENCOUNTER — Telehealth: Payer: Self-pay | Admitting: Orthopedic Surgery

## 2021-09-23 ENCOUNTER — Other Ambulatory Visit: Payer: Self-pay | Admitting: Gerontology

## 2021-09-23 ENCOUNTER — Emergency Department: Payer: Medicaid Other

## 2021-09-23 ENCOUNTER — Other Ambulatory Visit: Payer: Self-pay

## 2021-09-23 ENCOUNTER — Encounter: Payer: Self-pay | Admitting: Emergency Medicine

## 2021-09-23 DIAGNOSIS — M533 Sacrococcygeal disorders, not elsewhere classified: Secondary | ICD-10-CM

## 2021-09-23 DIAGNOSIS — I10 Essential (primary) hypertension: Secondary | ICD-10-CM | POA: Insufficient documentation

## 2021-09-23 DIAGNOSIS — Z5321 Procedure and treatment not carried out due to patient leaving prior to being seen by health care provider: Secondary | ICD-10-CM | POA: Insufficient documentation

## 2021-09-23 DIAGNOSIS — G8929 Other chronic pain: Secondary | ICD-10-CM

## 2021-09-23 DIAGNOSIS — M545 Low back pain, unspecified: Secondary | ICD-10-CM

## 2021-09-23 DIAGNOSIS — M25511 Pain in right shoulder: Secondary | ICD-10-CM | POA: Diagnosis not present

## 2021-09-23 DIAGNOSIS — Z89512 Acquired absence of left leg below knee: Secondary | ICD-10-CM

## 2021-09-23 DIAGNOSIS — G6289 Other specified polyneuropathies: Secondary | ICD-10-CM

## 2021-09-23 LAB — BASIC METABOLIC PANEL
Anion gap: 12 (ref 5–15)
BUN: 12 mg/dL (ref 6–20)
CO2: 27 mmol/L (ref 22–32)
Calcium: 10.2 mg/dL (ref 8.9–10.3)
Chloride: 97 mmol/L — ABNORMAL LOW (ref 98–111)
Creatinine, Ser: 0.6 mg/dL — ABNORMAL LOW (ref 0.61–1.24)
GFR, Estimated: >60 mL/min (ref >60)
Glucose, Bld: 249 mg/dL — ABNORMAL HIGH (ref 70–99)
Potassium: 3.6 mmol/L (ref 3.5–5.1)
Sodium: 136 mmol/L (ref 135–145)

## 2021-09-23 LAB — CBC
HCT: 46.5 % (ref 39.0–52.0)
Hemoglobin: 16.8 g/dL (ref 13.0–17.0)
MCH: 32.1 pg (ref 26.0–34.0)
MCHC: 36.1 g/dL — ABNORMAL HIGH (ref 30.0–36.0)
MCV: 88.7 fL (ref 80.0–100.0)
Platelets: 299 10*3/uL (ref 150–400)
RBC: 5.24 MIL/uL (ref 4.22–5.81)
RDW: 13.8 % (ref 11.5–15.5)
WBC: 12.5 10*3/uL — ABNORMAL HIGH (ref 4.0–10.5)
nRBC: 0 % (ref 0.0–0.2)

## 2021-09-23 LAB — TROPONIN I (HIGH SENSITIVITY): Troponin I (High Sensitivity): 21 ng/L — ABNORMAL HIGH (ref <18)

## 2021-09-23 NOTE — ED Triage Notes (Signed)
Pt arrived via POV with reports of high blood pressure today, states his blood pressure is better, but states he is having R shoulder pain related to overuse from transferring to his wheelchair.  Pt states he goes to pain clinic in [place] and has appt on 10/31.

## 2021-09-23 NOTE — Telephone Encounter (Signed)
Pt would like someone to call him. He keeps asking for more pain medication. Something stronger

## 2021-09-23 NOTE — Telephone Encounter (Signed)
I called and lm on vm to advise pt that per our last phone call he has to give the Neurontin about three weeks to see if it helps with the pain. He can take one tab up to three times a day ( he began med 8 days ago) and if there is failure to receive pain relief with this med then Denny Peon advised that we can change to Lyrica but need to give it two more weeks. To call with questions.

## 2021-09-24 ENCOUNTER — Other Ambulatory Visit: Payer: Self-pay

## 2021-09-24 ENCOUNTER — Emergency Department
Admission: EM | Admit: 2021-09-24 | Discharge: 2021-09-24 | Disposition: A | Discharge status: Home / Self Care | Payer: Medicaid Other | Attending: Emergency Medicine | Admitting: Emergency Medicine

## 2021-09-24 ENCOUNTER — Telehealth: Payer: Self-pay | Admitting: Orthopedic Surgery

## 2021-09-24 NOTE — Telephone Encounter (Signed)
Pt. Called requesting a call back from Autumn F about his gabapentin being pick up in [place] . Please call pt at [phone].

## 2021-09-24 NOTE — Telephone Encounter (Signed)
I called and pt states that he was not aware that the rx we sent in on 09/15/21 for Neurontin was available at the pharmacy until today and when he went there to pick it up the pharmacy had advised he had already picked up the medication on the day that it was faxed. The pt is adamant that this is not the case and he had a heated exchange per his report with the pharm and then called the police dept. He states that he will contact his insurance carrier next and that if it is proven that it was not him that picked up the rx can he then have a refill. I advised the pt that there are prescribing rules that providers are to follow and that if it was confirmed by the pharmacy that they mistakenly gave the pt's rx to another customer as the pt is saying what happened that we could discuss but not very positive that this will be the case.

## 2021-09-25 ENCOUNTER — Telehealth: Payer: Self-pay | Admitting: Emergency Medicine

## 2021-09-25 ENCOUNTER — Emergency Department
Admission: EM | Admit: 2021-09-25 | Discharge: 2021-09-25 | Disposition: A | Discharge status: Home / Self Care | Payer: Medicaid Other | Attending: Emergency Medicine | Admitting: Emergency Medicine

## 2021-09-25 ENCOUNTER — Other Ambulatory Visit: Payer: Self-pay

## 2021-09-25 ENCOUNTER — Emergency Department: Payer: Medicaid Other

## 2021-09-25 ENCOUNTER — Encounter: Payer: Self-pay | Admitting: Emergency Medicine

## 2021-09-25 DIAGNOSIS — I10 Essential (primary) hypertension: Secondary | ICD-10-CM | POA: Insufficient documentation

## 2021-09-25 DIAGNOSIS — Z7984 Long term (current) use of oral hypoglycemic drugs: Secondary | ICD-10-CM | POA: Diagnosis not present

## 2021-09-25 DIAGNOSIS — Z96611 Presence of right artificial shoulder joint: Secondary | ICD-10-CM | POA: Insufficient documentation

## 2021-09-25 DIAGNOSIS — Z79899 Other long term (current) drug therapy: Secondary | ICD-10-CM | POA: Insufficient documentation

## 2021-09-25 DIAGNOSIS — F17210 Nicotine dependence, cigarettes, uncomplicated: Secondary | ICD-10-CM | POA: Insufficient documentation

## 2021-09-25 DIAGNOSIS — G6289 Other specified polyneuropathies: Secondary | ICD-10-CM | POA: Insufficient documentation

## 2021-09-25 DIAGNOSIS — R52 Pain, unspecified: Secondary | ICD-10-CM

## 2021-09-25 DIAGNOSIS — M25511 Pain in right shoulder: Secondary | ICD-10-CM | POA: Diagnosis present

## 2021-09-25 DIAGNOSIS — G8929 Other chronic pain: Secondary | ICD-10-CM

## 2021-09-25 DIAGNOSIS — E119 Type 2 diabetes mellitus without complications: Secondary | ICD-10-CM | POA: Diagnosis not present

## 2021-09-25 MED ORDER — GABAPENTIN 400 MG PO CAPS
400.0000 mg | ORAL_CAPSULE | Freq: Three times a day (TID) | ORAL | 0 refills | Status: DC
  Filled 2021-09-25: qty 21, 7d supply, fill #0

## 2021-09-25 MED ORDER — LIDOCAINE 5 % EX PTCH
1.0000 | MEDICATED_PATCH | Freq: Once | CUTANEOUS | Status: DC
  Administered 2021-09-25: 1 via TRANSDERMAL
  Filled 2021-09-25: qty 1

## 2021-09-25 MED ORDER — KETOROLAC TROMETHAMINE 30 MG/ML IJ SOLN
30.0000 mg | Freq: Once | INTRAMUSCULAR | Status: AC
  Administered 2021-09-25: 30 mg via INTRAMUSCULAR
  Filled 2021-09-25: qty 1

## 2021-09-25 MED ORDER — GABAPENTIN 300 MG PO CAPS
400.0000 mg | ORAL_CAPSULE | Freq: Once | ORAL | Status: AC
  Administered 2021-09-25: 400 mg via ORAL
  Filled 2021-09-25: qty 1

## 2021-09-25 NOTE — ED Triage Notes (Signed)
Patient to ED via POV for right shoulder pain x1 week. Patient has hx of surgery on this shoulder. No new injury noted. Patient Aox4.

## 2021-09-25 NOTE — Telephone Encounter (Signed)
I called pt and he wanted to offer apology for yesterday. Said that he was very mad and did not mean to come across as rude. Advised that he was absolutely fine and nothing to be sorry for. He states that he is having terrible shoulder pain and that he is going to [facility] to see if they can help. Pt has follow up in the office Wednesday.

## 2021-09-25 NOTE — ED Triage Notes (Signed)
Pt arrived POV from home with c/o of right shoulder pain. Pt has ROM WNL. No swelling or deformity, no new injury. Pt reports that he is having horrible pain.

## 2021-09-25 NOTE — Telephone Encounter (Signed)
Pt calling and wanted to speak with autumn to give her an update on the previous prescription issue that came up. The best call back number is [phone].

## 2021-09-25 NOTE — ED Provider Notes (Signed)
[facility] Emergency Department Provider Note  ____________________________________________   Event Date/Time   First MD Initiated Contact with Patient 09/25/21 1559     (approximate)  I have reviewed the triage vital signs and the nursing notes.   HISTORY  Chief Complaint Shoulder Pain    HPI Justin Landry is a 60 y.o. 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  Although patient denies having a fall he uses his arms a lot because he has an amputation.  He has to use his arms to help transfer from his wheelchair into go up the stairs he picks up his body.  He states that he has been using his shoulder more than normal he is developed some increasing pain in the right shoulder, constant, worse with movement, better at rest.  He states this is been going on for about 2 weeks now and been gradually getting worse.  Denies any chest pain, shortness of breath.  He states that he is got follow-up with orthopedic for her shoulder onset 10/18.  He reports taking Tylenol for the pain but states that he has had issues getting his gabapentin secondary to a complication with the pharmacy.  Patient has had prior surgery on his shoulder about a year ago.       Past Medical History:  Diagnosis Date   Arthritis    right shoulder   Depression    Diabetes mellitus    Type II   Headache    migraines   High cholesterol    Humerus fracture    right   Hypertension    Inguinal hernia    Neuromuscular disorder ([place])    neuropathy    Patient Active Problem List   Diagnosis Date Noted   Type 2 diabetes mellitus with other specified complication ([place]) 02/58/5277   Acute osteomyelitis of left foot ([place]) 03/18/2021   Cutaneous abscess of left foot    Dehiscence of amputation stump ([place])    Subacute  osteomyelitis of left foot ([place])    Rupture of operation wound    Subacute osteomyelitis, left ankle and foot ([place])    Overgrown toenails 10/30/2020   Pre-operative clearance 09/16/2020   Abnormal EKG 09/16/2020   Chronic left shoulder pain 04/10/2020   Toe pain 02/21/2020   Anxiety 02/08/2020   Left knee pain 02/07/2020   Inguinodynia, right 11/13/2019   Elevated lipids 10/04/2019   Diabetic ulcer of toe of left foot associated with diabetes mellitus due to underlying condition ([place]) 08/23/2019   History of right inguinal hernia 08/14/2019   Encounter to establish care 08/14/2019   Anxiety 08/14/2019   Right shoulder pain 08/14/2019   Cellulitis 06/22/2019   Essential hypertension 05/27/2015   Type 2 diabetes mellitus without complication ([place]) 82/42/3536   Diverticulosis of large intestine without hemorrhage 10/16/2014   Recurrent right inguinal hernia 09/27/2014   Tobacco use 08/01/2014   Hematuria, gross 07/03/2014   Lower urinary tract symptoms (LUTS) 07/03/2014   Diabetes mellitus ([place]) 06/25/2014   Inguinal hernia, bilateral  04/10/2014    Past Surgical History:  Procedure Laterality Date   AMPUTATION Left 12/10/2020   Procedure: LEFT GREAT TOE AMPUTATION;  Surgeon: Newt Minion, MD;  Location: [facility];  Service: Orthopedics;  Laterality: Left;   AMPUTATION Left 01/21/2021   Procedure: 1ST AND 2ND RAY AMPUTATION  LEFT FOOT;  Surgeon: Newt Minion, MD;  Location: [facility];  Service: Orthopedics;  Laterality: Left;   AMPUTATION Left 03/18/2021   Procedure: LEFT BELOW KNEE AMPUTATION;  Surgeon: Newt Minion, MD;  Location: [facility];  Service: Orthopedics;  Laterality: Left;   AMPUTATION TOE Left 06/23/2019   Procedure: AMPUTATION TOE LEFT AMPUTATION;  Surgeon: Sharlotte Alamo, DPM;  Location: [facility];  Service: Podiatry;  Laterality: Left;   AMPUTATION TOE Left 08/31/2019   Procedure: AMPUTATION TOE  IPJ [id number];  Surgeon: Sharlotte Alamo, DPM;  Location: [facility];  Service: Podiatry;   Laterality: Left;   CYSTOSCOPY  2015   Biopsy   HERNIA REPAIR Right 2008   [place]   REVERSE SHOULDER ARTHROPLASTY Right 09/18/2020   Procedure: RIGHT REVERSE SHOULDER ARTHROPLASTY;  Surgeon: Meredith Pel, MD;  Location: [facility];  Service: Orthopedics;  Laterality: Right;    Prior to Admission medications   Medication Sig Start Date End Date Taking? Authorizing Provider  amLODipine (NORVASC) 10 MG tablet Take 1 tablet (10 mg total) by mouth daily. 07/08/21 07/08/22  Iloabachie, Chioma E, NP  blood glucose meter kit and supplies KIT Dispense based on patient and insurance preference. Use up to four times daily as directed. (FOR ICD-9 250.00, 250.01). 04/10/20   Iloabachie, Chioma E, NP  budesonide-formoterol (SYMBICORT) 160-4.5 MCG/ACT inhaler Inhale 2 puffs into the lungs 2 (two) times daily as needed (respiratory issues.).    [provider]  Fish Oil-Cholecalciferol (FISH OIL + D3) 1000-1000 MG-UNIT CAPS Take 1 capsule by mouth daily with breakfast.    [provider]  gabapentin (NEURONTIN) 400 MG capsule Take 1 capsule (400 mg total) by mouth 3 (three) times daily. 09/15/21   Newt Minion, MD  Glucosamine-Chondroitin (COSAMIN DS PO) Take 1 tablet by mouth daily. Patient not taking: No sig reported    [provider]  HYDROcodone-acetaminophen (NORCO) 10-325 MG tablet Take 1 tablet by mouth every 8 (eight) hours as needed. 08/25/21   Suzan Slick, NP  lisinopril (ZESTRIL) 40 MG tablet Take 1 tablet (40 mg total) by mouth once daily. 07/08/21 07/08/22  Iloabachie, Chioma E, NP  metFORMIN (GLUCOPHAGE) 1000 MG tablet TAKE ONE TABLET BY MOUTH 2 TIMES A DAY WITH A MEAL 07/08/21 07/08/22  Iloabachie, Chioma E, NP  predniSONE (DELTASONE) 50 MG tablet TAKE ONE TABLET BY MOUTH ONCE DAILY FOR 5 DAYS. 08/21/21   Suzan Slick, NP    Allergies Anacin-3 [acetaminophen], Morphine and related, Oxycontin [oxycodone hcl], Penicillins, and Tramadol  Family History  Problem  Relation Age of Onset   Diabetes Mother    Hypertension Mother    Diabetes Father    Hypertension Father    Cancer Brother        brain    Social History Social History   Tobacco Use   Smoking status: Every Day    Packs/day: 0.25    Years: 43.00    Pack years: 10.75    Types: Cigarettes   Smokeless tobacco: Never   Tobacco comments:    cut down to quarter pack a day, aware of resources  Vaping Use   Vaping Use: Never used  Substance Use Topics   Alcohol use: Not Currently    Alcohol/week: 2.0 standard drinks    Types: 2 Cans of beer per week    Comment: last use May 2022   Drug use: Not Currently    Frequency: 7.0 times per week    Types: Marijuana  Comment: last use was 06/04/2021      Review of Systems Constitutional: No fever/chills Eyes: No visual changes. ENT: No sore throat. Cardiovascular: Denies chest pain. Respiratory: Denies shortness of breath. Gastrointestinal: No abdominal pain.  No nausea, no vomiting.  No diarrhea.  No constipation. Genitourinary: Negative for dysuria. Musculoskeletal: Negative for back pain.  Shoulder pain Skin: Negative for rash. Neurological: Negative for headaches, focal weakness or numbness. All other ROS negative ____________________________________________   PHYSICAL EXAM:  VITAL SIGNS: ED Triage Vitals  Enc Vitals Group     BP 09/25/21 1512 (!) 169/104     Pulse Rate 09/25/21 1512 99     Resp 09/25/21 1512 18     Temp 09/25/21 1512 98.2 F (36.8 C)     Temp Source 09/25/21 1512 Oral     SpO2 09/25/21 1512 96 %     Weight 09/25/21 1513 210 lb (95.3 kg)     Height 09/25/21 1513 _0  (1.702 m)     Head Circumference --      Peak Flow --      Pain Score 09/25/21 1513 7     Pain Loc --      Pain Edu? --      Excl. in [place]? --     Constitutional: Alert and oriented. Well appearing and in no acute distress. Eyes: Conjunctivae are normal. EOMI. Head: Atraumatic. Nose: No congestion/rhinnorhea. Mouth/Throat:  Mucous membranes are moist.   Neck: No stridor. Trachea Midline. FROM Cardiovascular: Normal rate, regular rhythm. Grossly normal heart sounds.  Good peripheral circulation. Respiratory: Normal respiratory effort.  No retractions. Lungs CTAB. Gastrointestinal: Soft and nontender. No distention. No abdominal bruits.  Musculoskeletal: Tenderness reported on the right shoulder with some decreased range of motion with trying to lift his arm up.  No swelling in the arm, no redness, no warmth.  2+ distal pulse.  Sensation intact.  Median, radial, ulnar nerve are intact. Neurologic:  Normal speech and language. No gross focal neurologic deficits are appreciated.  Skin:  Skin is warm, dry and intact. No rash noted. Psychiatric: Mood and affect are normal. Speech and behavior are normal. GU: Deferred   ____________________________________________   LABS (all labs ordered are listed, but only abnormal results are displayed)  Labs Reviewed - No data to display ____________________________________________   ED ECG REPORT I, Vanessa [place], the attending physician, personally viewed and interpreted this ECG.   ____________________________________________  RADIOLOGY I, Vanessa [place], personally viewed and evaluated these images (plain radiographs) as part of my medical decision making, as well as reviewing the written report by the radiologist.  ED MD interpretation:   shoulder replacement   Official radiology report(s): DG Shoulder Right  Result Date: 09/25/2021 CLINICAL DATA:  Right shoulder pain x1 week. EXAM: RIGHT SHOULDER - 2+ VIEW COMPARISON:  None. FINDINGS: There is no evidence of an acute fracture or dislocation. A radiopaque right shoulder replacement is seen. There is no evidence of surrounding lucency to suggest the presence of hardware loosening or infection. Degenerative changes are seen involving the right acromioclavicular joint. Soft tissues are unremarkable. IMPRESSION: Right  shoulder replacement without evidence of hardware loosening or infection. Electronically Signed   By: Virgina Norfolk M.D.   On: 09/25/2021 15:57    ____________________________________________   PROCEDURES  Procedure(s) performed (including Critical Care):  Procedures   ____________________________________________   INITIAL IMPRESSION / ASSESSMENT AND PLAN / ED COURSE  Justin Landry was evaluated in Emergency Department on 09/25/2021 for the  symptoms described in the history of present illness. He was evaluated in the context of the global COVID-19 pandemic, which necessitated consideration that the patient might be at risk for infection with the SARS-CoV-2 virus that causes COVID-19. Institutional protocols and algorithms that pertain to the evaluation of patients at risk for COVID-19 are in a state of rapid change based on information released by regulatory bodies including the CDC and federal and state organizations. These policies and algorithms were followed during the patient's care in the ED.    Patient comes in with chronic pain of his right shoulder.  On review of patient's database he has had multiple fills of oxycodone from doctors.  Explained to patient that I cannot do a prescription for oxycodone today and that he needs to follow-up with one of his doctors that typically is prescribing him these oxycodone.  Chest x-ray was ordered in triage without any evidence of new fracture or hardware misplacement.  There is no evidence of septic joint.  No evidence of DVT.  I suspect this is just from overuse.  We will give a shot of Toradol and a lidocaine patch and patient can follow-up with orthopedic surgery.  We will give him a 7-day prescription of gabapentin until he can get his 58-monthsupply figured out with the pharmacy         ____________________________________________   FINAL CLINICAL IMPRESSION(S) / ED DIAGNOSES   Final diagnoses:  Pain  Chronic right  shoulder pain  Other polyneuropathy      MEDICATIONS GIVEN DURING THIS VISIT:  Medications  lidocaine (LIDODERM) 5 % 1 patch (has no administration in time range)  ketorolac (TORADOL) 30 MG/ML injection 30 mg (has no administration in time range)  gabapentin (NEURONTIN) capsule 400 mg (has no administration in time range)     ED Discharge Orders     None        Note:  This document was prepared using Dragon voice recognition software and may include unintentional dictation errors.    FVanessa [place] MD 09/25/21 [phone]

## 2021-09-25 NOTE — Telephone Encounter (Signed)
Called patient due to left emergency department before provider exam to inquire about condition and follow up plans. Patient says he feels much better today and bp is down now.  I explained the troponin which is elevated and recoommended that he either be seen by his doctor or here due to possible heart damage.

## 2021-09-28 ENCOUNTER — Other Ambulatory Visit: Payer: Self-pay

## 2021-09-30 ENCOUNTER — Other Ambulatory Visit: Payer: Self-pay

## 2021-09-30 ENCOUNTER — Ambulatory Visit (INDEPENDENT_AMBULATORY_CARE_PROVIDER_SITE_OTHER): Payer: Medicaid Other | Admitting: Orthopedic Surgery

## 2021-09-30 ENCOUNTER — Ambulatory Visit: Payer: Self-pay

## 2021-09-30 ENCOUNTER — Encounter: Payer: Self-pay | Admitting: Physical Medicine and Rehabilitation

## 2021-09-30 DIAGNOSIS — M25511 Pain in right shoulder: Secondary | ICD-10-CM

## 2021-09-30 DIAGNOSIS — G8929 Other chronic pain: Secondary | ICD-10-CM

## 2021-09-30 DIAGNOSIS — M79601 Pain in right arm: Secondary | ICD-10-CM

## 2021-09-30 DIAGNOSIS — M542 Cervicalgia: Secondary | ICD-10-CM | POA: Diagnosis not present

## 2021-09-30 MED ORDER — TRAMADOL HCL 50 MG PO TABS: ORAL_TABLET | ORAL | 0 refills | Status: DC

## 2021-10-01 ENCOUNTER — Telehealth: Payer: Self-pay | Admitting: Orthopedic Surgery

## 2021-10-01 NOTE — Telephone Encounter (Signed)
Pt called and asking for his tramadol. [facility] would [facility] [address] [facility] [place].   CB [phone]

## 2021-10-01 NOTE — Telephone Encounter (Signed)
IC advised patient Dr August Saucer had sent this medication in yesterday to [facility]. He stated he was near this pharmacy and will stop by there today to pick this up

## 2021-10-04 ENCOUNTER — Encounter: Payer: Self-pay | Admitting: Orthopedic Surgery

## 2021-10-04 NOTE — Progress Notes (Signed)
Office Visit Note   Patient: Justin Landry           Date of Birth: 12/20/1960           MRN: [id number] Visit Date: 09/30/2021 Requested by: Rolm Gala, NP [address] [place],  [place] [address] PCP: Rolm Gala, NP  Subjective: Chief Complaint  Patient presents with   Right Shoulder - Pain    HPI: [name] is a 60 year old patient with right shoulder pain.  Last injected 1923.  Parameters are negative for infection.  Culture is negative for infection.  Tried Tylenol and lidocaine patch.  Has pain management appointment next month.  Denies any fevers or chills.              ROS: All systems reviewed are negative as they relate to the chief complaint within the history of present illness.  Patient denies  fevers or chills.   Assessment & Plan: Visit Diagnoses:  1. Chronic right shoulder pain   2. Right arm pain     Plan: Impression is right shoulder pain with some possible radicular component but no symptoms below the elbow.  Shoulder moves well.  Incision intact.  No lymphadenopathy or warmth to the right shoulder region.  Plain x-rays cervical spine do show degenerative changes which could be contributing to radicular component of symptoms.  Plan is tramadol one-time prescription only until his pain management appointment and MRI cervical spine to evaluate right-sided radiculopathy.  Follow-Up Instructions: Return for after MRI.   Orders:  Orders Placed This Encounter  Procedures   XR Cervical Spine 2 or 3 views   MR Cervical Spine w/o contrast   Meds ordered this encounter  Medications   traMADol (ULTRAM) 50 MG tablet    Sig: 1 po q d prn pain    Dispense:  30 tablet    Refill:  0      Procedures: No procedures performed   Clinical Data: No additional findings.  Objective: Vital Signs: There were no vitals taken for this visit.  Physical Exam:   Constitutional: Patient appears well-developed HEENT:  Head:  Normocephalic Eyes:EOM are normal Neck: Normal range of motion Cardiovascular: Normal rate Pulmonary/chest: Effort normal Neurologic: Patient is alert Skin: Skin is warm Psychiatric: Patient has normal mood and affect   Ortho Exam: Ortho exam demonstrates full active and passive range of motion of the elbow and wrist on the right.  Cervical spine range of motion demonstrates flexion chin to chest extension about 30 degrees rotation is about 40 degrees bilaterally.  Patient has very good passive range of motion of the right arm.  No warmth around the shoulder region no lymphadenopathy.  Incision intact.  Radial pulse intact.  Specialty Comments:  No specialty comments available.  Imaging: No results found.   PMFS History: Patient Active Problem List   Diagnosis Date Noted   Type 2 diabetes mellitus with other specified complication ([facility]) 04/21/2021   Acute osteomyelitis of left foot ([facility]) 03/18/2021   Cutaneous abscess of left foot    Dehiscence of amputation stump ([facility])    Subacute osteomyelitis of left foot ([facility])    Rupture of operation wound    Subacute osteomyelitis, left ankle and foot ([facility])    Overgrown toenails 10/30/2020   Pre-operative clearance 09/16/2020   Abnormal EKG 09/16/2020   Chronic left shoulder pain 04/10/2020   Toe pain 02/21/2020   Anxiety 02/08/2020   Left knee pain 02/07/2020   Inguinodynia, right 11/13/2019  Elevated lipids 10/04/2019   Diabetic ulcer of toe of left foot associated with diabetes mellitus due to underlying condition ([facility]) 08/23/2019   History of right inguinal hernia 08/14/2019   Encounter to establish care 08/14/2019   Anxiety 08/14/2019   Right shoulder pain 08/14/2019   Cellulitis 06/22/2019   Essential hypertension 05/27/2015   Type 2 diabetes mellitus without complication ([facility]) 05/27/2015   Diverticulosis of large intestine without hemorrhage 10/16/2014   Recurrent right inguinal hernia 09/27/2014   Tobacco use 08/01/2014    Hematuria, gross 07/03/2014   Lower urinary tract symptoms (LUTS) 07/03/2014   Diabetes mellitus ([facility]) 06/25/2014   Inguinal hernia, bilateral  04/10/2014   Past Medical History:  Diagnosis Date   Arthritis    right shoulder   Depression    Diabetes mellitus    Type II   Headache    migraines   High cholesterol    Humerus fracture    right   Hypertension    Inguinal hernia    Neuromuscular disorder ([facility])    neuropathy    Family History  Problem Relation Age of Onset   Diabetes Mother    Hypertension Mother    Diabetes Father    Hypertension Father    Cancer Brother        brain    Past Surgical History:  Procedure Laterality Date   AMPUTATION Left 12/10/2020   Procedure: LEFT GREAT TOE AMPUTATION;  Surgeon: [name], MD;  [facility];  Service: Orthopedics;  Laterality: Left;   AMPUTATION Left 01/21/2021   Procedure: 1ST AND 2ND RAY AMPUTATION LEFT FOOT;  Surgeon: [name], MD;  Location: [facility];  Service: Orthopedics;  Laterality: Left;   AMPUTATION Left 03/18/2021   Procedure: LEFT BELOW KNEE AMPUTATION;  Surgeon: [name], MD;  Location: [facility];  Service: Orthopedics;  Laterality: Left;   AMPUTATION TOE Left 06/23/2019   Procedure: AMPUTATION TOE LEFT AMPUTATION;  Surgeon: Linus Galas, DPM;  Location: [facility];  Service: Podiatry;  Laterality: Left;   AMPUTATION TOE Left 08/31/2019   Procedure: AMPUTATION TOE  IPJ [id number];  Surgeon: Linus Galas, DPM;  Location: [facility];  Service: Podiatry;  Laterality: Left;   CYSTOSCOPY  2015   Biopsy   HERNIA REPAIR Right 2008   [place]   REVERSE SHOULDER ARTHROPLASTY Right 09/18/2020   Procedure: RIGHT REVERSE SHOULDER ARTHROPLASTY;  Surgeon: Cammy Copa, MD;  Location: [facility];  Service: Orthopedics;  Laterality: Right;   Social History   Occupational History   Occupation: unemployed  Tobacco Use   Smoking status: Every Day    Packs/day: 0.25    Years: 43.00    Pack years: 10.75    Types:  Cigarettes   Smokeless tobacco: Never   Tobacco comments:    cut down to quarter pack a day, aware of resources  Vaping Use   Vaping Use: Never used  Substance and Sexual Activity   Alcohol use: Not Currently    Alcohol/week: 2.0 standard drinks    Types: 2 Cans of beer per week    Comment: last use May 2022   Drug use: Not Currently    Frequency: 7.0 times per week    Types: Marijuana    Comment: last use was 06/04/2021   Sexual activity: Yes

## 2021-10-05 ENCOUNTER — Telehealth: Payer: Self-pay | Admitting: Orthopedic Surgery

## 2021-10-07 ENCOUNTER — Ambulatory Visit: Payer: Medicaid Other | Admitting: Gerontology

## 2021-10-09 ENCOUNTER — Telehealth: Payer: Self-pay | Admitting: Orthopedic Surgery

## 2021-10-09 NOTE — Telephone Encounter (Signed)
Pt called requesting to call pharmacy on refill of tramadol. Pt is asking for a call back when meds request for refill sent in. Please call pt [phone].

## 2021-10-12 ENCOUNTER — Telehealth: Payer: Self-pay | Admitting: Surgical

## 2021-10-12 NOTE — Telephone Encounter (Signed)
Patient called advised the Rx for Tramadol is not showing [facility] [place] for him to pick up.  Patient asked for a call back when Rx is sent to the pharmacy.  The number to contact patient is [phone]

## 2021-10-12 NOTE — Telephone Encounter (Signed)
FYI.   I called patient. He states that he only received #5 tablets from Tramadol 50mg  rx that was sent in to [facility] on 09/30/21. He was requesting more medication be sent to CVS in [place]. I explained that I would have to call [facility] to find out why he only received #5. I did tell him that this was sometimes due to insurance and what they would cover. He went on to explain how something is going on at the [facility] in [place] and how they had said that he picked up his Gabapentin when he didn't. His buddy has also had this same problem with that same pharmacy with narcotic medication. They have both called the police regarding the matter. I asked multiple times if he is sure this is where he would like medications sent to if he is having such problems. He assured me, yes.  I called [facility]. Patient's insurance will only allow #5 tablets at a time. She tried to override it, but was unable to . They were holding the other #25 tablets from previous rx. Patient can pick up #5 again, then call back in 4 days for them to fill the next 5. Refills will need to be picked up at [facility] as they have initially been filling the rx.   I called patient back to explain but voicemail picked up. I left extremely detailed voicemail that he would need to pick up at [facility] [place] [facility].

## 2021-10-13 NOTE — Telephone Encounter (Signed)
I guess he will have to just do what his insurance allows, not sure there's anything else we can do until the C-spine MRI gets done

## 2021-10-13 NOTE — Telephone Encounter (Signed)
Tried calling to discuss.  

## 2021-10-14 ENCOUNTER — Encounter: Payer: Self-pay | Admitting: Family

## 2021-10-14 ENCOUNTER — Ambulatory Visit (INDEPENDENT_AMBULATORY_CARE_PROVIDER_SITE_OTHER): Payer: Medicaid Other | Admitting: Family

## 2021-10-14 DIAGNOSIS — B351 Tinea unguium: Secondary | ICD-10-CM | POA: Diagnosis not present

## 2021-10-14 DIAGNOSIS — M79672 Pain in left foot: Secondary | ICD-10-CM | POA: Diagnosis not present

## 2021-10-14 DIAGNOSIS — M79671 Pain in right foot: Secondary | ICD-10-CM

## 2021-10-14 NOTE — Progress Notes (Signed)
Office Visit Note   Patient: Justin Landry           Date of Birth: [birth year]           MRN: [id number] Visit Date: 10/14/2021              Requested by: Langston Reusing, NP [place],  [place] [address] PCP: Langston Reusing, NP  Chief Complaint  Patient presents with   Right Foot - Follow-up    Toe nail trimming      HPI: The patient is a 60 year old gentleman who presents today for right foot evaluation.  He has onychomycotic nails and cannot safely trim these on his own.  No other concerns.  Assessment & Plan: Visit Diagnoses: No diagnosis found.  Plan: Nails trimmed today x5.  Patient tolerated well.  He will follow-up in the office in 3 months.  Follow-Up Instructions: Return in about 3 months (around 01/14/2022).   Ortho Exam  Patient is alert, oriented, no adenopathy, well-dressed, normal affect, normal respiratory effort. On examination of the right foot there is no edema no erythema he does have thickened and discolored onychomycotic nails x5 these were trimmed today in the office x5 patient is unable to safely trim his own nails.  There are no ulcers no callus.  Imaging: No results found. No images are attached to the encounter.  Labs: Lab Results  Component Value Date   HGBA1C 6.3 (A) 07/08/2021   HGBA1C 7.5 (H) 03/18/2021   HGBA1C 7.5 (H) 09/24/2020   ESRSEDRATE 2 07/17/2021   CRP 8.3 (H) 07/17/2021   REPTSTATUS 09/11/2020 FINAL 09/10/2020   GRAMSTAIN  06/23/2019    FEW WBC PRESENT, PREDOMINANTLY PMN RARE GRAM POSITIVE COCCI    CULT (A) 09/10/2020    <10,000 COLONIES/mL INSIGNIFICANT GROWTH Performed at [facility] [address], [place], [place] [address]    LABORGA LECLERCIA ADECARBOXYLATA 06/23/2019   LABORGA STAPHYLOCOCCUS CAPRAE 06/23/2019   LABORGA STAPHYLOCOCCUS SIMULANS 06/23/2019     Lab Results  Component Value Date   ALBUMIN 4.2 09/10/2020   ALBUMIN 4.5 07/14/2020   ALBUMIN 4.5  06/25/2020    No results found for: MG No results found for: VD25OH  No results found for: PREALBUMIN CBC EXTENDED Latest Ref Rng & Units 09/23/2021 07/17/2021 03/20/2021  WBC 4.0 - 10.5 K/uL 12.5(H) 14.7(H) 12.1(H)  RBC 4.22 - 5.81 MIL/uL 5.24 5.78 4.56  HGB 13.0 - 17.0 g/dL 16.8 17.3(H) 13.3  HCT 39.0 - 52.0 % 46.5 51.2(H) 38.9(L)  PLT 150 - 400 K/uL 299 356 395  NEUTROABS 1,500 - 7,800 cells/uL - 10,114(H) -  LYMPHSABS 850 - 3,900 cells/uL - 3,175 -     There is no height or weight on file to calculate BMI.  Orders:  No orders of the defined types were placed in this encounter.  No orders of the defined types were placed in this encounter.    Procedures: No procedures performed  Clinical Data: No additional findings.  ROS:  All other systems negative, except as noted in the HPI. Review of Systems  Objective: Vital Signs: There were no vitals taken for this visit.  Specialty Comments:  No specialty comments available.  PMFS History: Patient Active Problem List   Diagnosis Date Noted   Type 2 diabetes mellitus with other specified complication ([place]) [id number]   Acute osteomyelitis of left foot ([place]) 03/18/2021   Cutaneous abscess of left foot    Dehiscence of amputation stump (  HCC)    Subacute osteomyelitis of left foot (HCC)    Rupture of operation wound    Subacute osteomyelitis, left ankle and foot (HCC)    Overgrown toenails 10/30/2020   Pre-operative clearance 09/16/2020   Abnormal EKG 09/16/2020   Chronic left shoulder pain 04/10/2020   Toe pain 02/21/2020   Anxiety 02/08/2020   Left knee pain 02/07/2020   Inguinodynia, right 11/13/2019   Elevated lipids 10/04/2019   Diabetic ulcer of toe of left foot associated with diabetes mellitus due to underlying condition (HCC) 08/23/2019   History of right inguinal hernia 08/14/2019   Encounter to establish care 08/14/2019   Anxiety 08/14/2019   Right shoulder pain 08/14/2019   Cellulitis 06/22/2019    Essential hypertension 05/27/2015   Type 2 diabetes mellitus without complication (HCC) 05/27/2015   Diverticulosis of large intestine without hemorrhage 10/16/2014   Recurrent right inguinal hernia 09/27/2014   Tobacco use 08/01/2014   Hematuria, gross 07/03/2014   Lower urinary tract symptoms (LUTS) 07/03/2014   Diabetes mellitus (HCC) 06/25/2014   Inguinal hernia, bilateral  04/10/2014   Past Medical History:  Diagnosis Date   Arthritis    right shoulder   Depression    Diabetes mellitus    Type II   Headache    migraines   High cholesterol    Humerus fracture    right   Hypertension    Inguinal hernia    Neuromuscular disorder (HCC)    neuropathy    Family History  Problem Relation Age of Onset   Diabetes Mother    Hypertension Mother    Diabetes Father    Hypertension Father    Cancer Brother        brain    Past Surgical History:  Procedure Laterality Date   AMPUTATION Left 12/10/2020   Procedure: LEFT GREAT TOE AMPUTATION;  Surgeon: [name], MD;  Location: [facility] OR;  Service: Orthopedics;  Laterality: Left;   AMPUTATION Left 01/21/2021   Procedure: 1ST AND 2ND RAY AMPUTATION LEFT FOOT;  Surgeon: [name], MD;  Location: [facility] OR;  Service: Orthopedics;  Laterality: Left;   AMPUTATION Left 03/18/2021   Procedure: LEFT BELOW KNEE AMPUTATION;  Surgeon: [name], MD;  Location: [facility] OR;  Service: Orthopedics;  Laterality: Left;   AMPUTATION TOE Left 06/23/2019   Procedure: AMPUTATION TOE LEFT AMPUTATION;  Surgeon: Linus Galas, DPM;  Location: [facility] ORS;  Service: Podiatry;  Laterality: Left;   AMPUTATION TOE Left 08/31/2019   Procedure: AMPUTATION TOE  IPJ [id number];  Surgeon: Linus Galas, DPM;  Location: [facility] ORS;  Service: Podiatry;  Laterality: Left;   CYSTOSCOPY  2015   Biopsy   HERNIA REPAIR Right 2008   [place]   REVERSE SHOULDER ARTHROPLASTY Right 09/18/2020   Procedure: RIGHT REVERSE SHOULDER ARTHROPLASTY;  Surgeon: Cammy Copa, MD;  Location:  [facility] OR;  Service: Orthopedics;  Laterality: Right;   Social History   Occupational History   Occupation: unemployed  Tobacco Use   Smoking status: Every Day    Packs/day: 0.25    Years: 43.00    Pack years: 10.75    Types: Cigarettes   Smokeless tobacco: Never   Tobacco comments:    cut down to quarter pack a day, aware of resources  Vaping Use   Vaping Use: Never used  Substance and Sexual Activity   Alcohol use: Not Currently    Alcohol/week: 2.0 standard drinks    Types: 2 Cans of beer per week  Comment: last use May 2022   Drug use: Not Currently    Frequency: 7.0 times per week    Types: Marijuana    Comment: last use was 06/04/2021   Sexual activity: Yes

## 2021-10-14 NOTE — Telephone Encounter (Signed)
Tried again, no answer

## 2021-10-21 ENCOUNTER — Ambulatory Visit
Admission: RE | Admit: 2021-10-21 | Discharge: 2021-10-21 | Disposition: A | Discharge status: Home / Self Care | Payer: Medicaid Other | Source: Ambulatory Visit | Attending: Orthopedic Surgery | Admitting: Orthopedic Surgery

## 2021-10-21 ENCOUNTER — Other Ambulatory Visit: Payer: Self-pay

## 2021-10-21 DIAGNOSIS — M79601 Pain in right arm: Secondary | ICD-10-CM

## 2021-10-22 ENCOUNTER — Telehealth: Payer: Self-pay | Admitting: Pharmacy Technician

## 2021-10-22 NOTE — Telephone Encounter (Signed)
Patient has Medicaid with prescription drug coverage.  No longer meets the eligibility criteria for [facility].  Patient notified.  Sherilyn Dacosta Care Manager [facility]   [place] [address], [place]  [address]  October 22, 2021    Dayv[place] Sans [address] [place], [place]  [address]  Dear Marilu Favre:  This is to inform you that you are no longer eligible to receive medication assistance at [facility].  The reason(s) are:    _____Your total gross monthly household income exceeds 250% of the Federal Poverty Level.   _____Tangible assets (savings, checking, stocks/bonds, pension, retirement, etc.) exceeds our limit  __X__You are eligible to receive benefits from [facility], [facility] or HIV Medication            Assistance program _____You are eligible to receive benefits from a Medicare Part "D" plan __X__You have prescription insurance with Medicaid _____You are not an [facility] resident _____Failure to provide all requested documentation (proof of income information for 2022., and/or Patient Intake Application, DOH Attestation, Contract, etc).    We regret that we are unable to help you at this time.  If your prescription coverage is terminated, please contact [facility], so that we may reassess your eligibility for our program.  If you have questions, we may be contacted at [phone].  Thank you,  [facility]

## 2021-10-26 ENCOUNTER — Other Ambulatory Visit: Payer: Medicaid Other

## 2021-10-28 ENCOUNTER — Other Ambulatory Visit: Payer: Self-pay

## 2021-11-09 ENCOUNTER — Other Ambulatory Visit: Payer: Self-pay

## 2021-11-19 ENCOUNTER — Ambulatory Visit: Payer: Medicaid Other | Admitting: Orthopedic Surgery

## 2021-11-26 ENCOUNTER — Other Ambulatory Visit: Payer: Self-pay

## 2021-11-26 DIAGNOSIS — Z89512 Acquired absence of left leg below knee: Secondary | ICD-10-CM

## 2021-11-30 ENCOUNTER — Ambulatory Visit: Payer: Medicaid Other | Admitting: Orthopedic Surgery

## 2021-12-09 ENCOUNTER — Ambulatory Visit: Payer: Medicaid Other

## 2021-12-23 ENCOUNTER — Ambulatory Visit: Payer: Medicaid Other

## 2022-01-14 ENCOUNTER — Ambulatory Visit: Payer: Medicaid Other | Admitting: Orthopedic Surgery

## 2022-01-21 ENCOUNTER — Encounter: Payer: Medicaid Other | Admitting: Physical Medicine and Rehabilitation

## 2022-01-25 ENCOUNTER — Ambulatory Visit: Payer: Medicaid Other | Admitting: Orthopedic Surgery

## 2022-01-26 ENCOUNTER — Encounter: Payer: Self-pay | Admitting: Orthopedic Surgery

## 2022-01-26 ENCOUNTER — Ambulatory Visit (INDEPENDENT_AMBULATORY_CARE_PROVIDER_SITE_OTHER): Payer: Medicaid Other | Admitting: Orthopedic Surgery

## 2022-01-26 ENCOUNTER — Other Ambulatory Visit: Payer: Self-pay

## 2022-01-26 DIAGNOSIS — Z89512 Acquired absence of left leg below knee: Secondary | ICD-10-CM

## 2022-01-26 DIAGNOSIS — B351 Tinea unguium: Secondary | ICD-10-CM | POA: Diagnosis not present

## 2022-01-26 NOTE — Progress Notes (Signed)
Office Visit Note   Patient: Justin Landry           Date of Birth: Mar 15, 1961           MRN: [id number] Visit Date: 01/26/2022              Requested by: Langston Reusing, NP [place],  [place] [address] PCP: Pcp, No  Chief Complaint  Patient presents with   Right Foot - Follow-up    Nail trimming      HPI: Patient is a 61 year old gentleman status post left transtibial amputation.  He also has thickened discolored onychomycotic nails on the right foot he is unable to safely trim on his own.  Assessment & Plan: Visit Diagnoses:  1. Hx of BKA, left ([place])   2. Onychomycosis     Plan: Nails were trimmed x5 without complications.  Residual limb shows no complications.  Follow-Up Instructions: Return in about 3 months (around 04/25/2022).   Ortho Exam  Patient is alert, oriented, no adenopathy, well-dressed, normal affect, normal respiratory effort. Examination patient has full range of motion of the left knee the residual limb has no ulcers no cellulitis no swelling.  Examination the right foot there is no ulcers or cellulitis he does have thickened discolored onychomycotic nails x5 he is unable to safely trim the nails on his own and the nails were trimmed x5 without complications.  Imaging: No results found.   Labs: Lab Results  Component Value Date   HGBA1C 6.3 (A) 07/08/2021   HGBA1C 7.5 (H) 03/18/2021   HGBA1C 7.5 (H) 09/24/2020   ESRSEDRATE 2 07/17/2021   CRP 8.3 (H) 07/17/2021   REPTSTATUS 09/11/2020 FINAL 09/10/2020   GRAMSTAIN  06/23/2019    FEW WBC PRESENT, PREDOMINANTLY PMN RARE GRAM POSITIVE COCCI    CULT (A) 09/10/2020    <10,000 COLONIES/mL INSIGNIFICANT GROWTH Performed at [facility] [address], [place], [place] [address]    LABORGA LECLERCIA ADECARBOXYLATA 06/23/2019   LABORGA STAPHYLOCOCCUS CAPRAE 06/23/2019   LABORGA STAPHYLOCOCCUS SIMULANS 06/23/2019     Lab Results  Component Value Date    ALBUMIN 4.2 09/10/2020   ALBUMIN 4.5 07/14/2020   ALBUMIN 4.5 06/25/2020    No results found for: MG No results found for: VD25OH  No results found for: PREALBUMIN CBC EXTENDED Latest Ref Rng & Units 09/23/2021 07/17/2021 03/20/2021  WBC 4.0 - 10.5 K/uL 12.5(H) 14.7(H) 12.1(H)  RBC 4.22 - 5.81 MIL/uL 5.24 5.78 4.56  HGB 13.0 - 17.0 g/dL 16.8 17.3(H) 13.3  HCT 39.0 - 52.0 % 46.5 51.2(H) 38.9(L)  PLT 150 - 400 K/uL 299 356 395  NEUTROABS 1,500 - 7,800 cells/uL - 10,114(H) -  LYMPHSABS 850 - 3,900 cells/uL - 3,175 -     There is no height or weight on file to calculate BMI.  Orders:  No orders of the defined types were placed in this encounter.  No orders of the defined types were placed in this encounter.    Procedures: No procedures performed  Clinical Data: No additional findings.  ROS:  All other systems negative, except as noted in the HPI. Review of Systems  Objective: Vital Signs: There were no vitals taken for this visit.  Specialty Comments:  No specialty comments available.  PMFS History: Patient Active Problem List   Diagnosis Date Noted   Type 2 diabetes mellitus with other specified complication ([place]) A999333   Acute osteomyelitis of left foot ([place]) 03/18/2021   Cutaneous  abscess of left foot    Dehiscence of amputation stump (HCC)    Subacute osteomyelitis of left foot (HCC)    Rupture of operation wound    Subacute osteomyelitis, left ankle and foot ([place])    Overgrown toenails 10/30/2020   Pre-operative clearance 09/16/2020   Abnormal EKG 09/16/2020   Chronic left shoulder pain 04/10/2020   Toe pain 02/21/2020   Anxiety 02/08/2020   Left knee pain 02/07/2020   Inguinodynia, right 11/13/2019   Elevated lipids 10/04/2019   Diabetic ulcer of toe of left foot associated with diabetes mellitus due to underlying condition ([place]) 08/23/2019   History of right inguinal hernia 08/14/2019   Encounter to establish care 08/14/2019   Anxiety 08/14/2019    Right shoulder pain 08/14/2019   Cellulitis 06/22/2019   Essential hypertension 05/27/2015   Type 2 diabetes mellitus without complication ([place]) [id number]   Diverticulosis of large intestine without hemorrhage 10/16/2014   Recurrent right inguinal hernia 09/27/2014   Tobacco use 08/01/2014   Hematuria, gross 07/03/2014   Lower urinary tract symptoms (LUTS) 07/03/2014   Diabetes mellitus ([place]) 06/25/2014   Inguinal hernia, bilateral  04/10/2014   Past Medical History:  Diagnosis Date   Arthritis    right shoulder   Depression    Diabetes mellitus    Type II   Headache    migraines   High cholesterol    Humerus fracture    right   Hypertension    Inguinal hernia    Neuromuscular disorder ([place])    neuropathy    Family History  Problem Relation Age of Onset   Diabetes Mother    Hypertension Mother    Diabetes Father    Hypertension Father    Cancer Brother        brain    Past Surgical History:  Procedure Laterality Date   AMPUTATION Left 12/10/2020   Procedure: LEFT GREAT TOE AMPUTATION;  Surgeon: Newt Minion, MD;  Location: [place];  Service: Orthopedics;  Laterality: Left;   AMPUTATION Left 01/21/2021   Procedure: 1ST AND 2ND RAY AMPUTATION LEFT FOOT;  Surgeon: Newt Minion, MD;  Location: [facility];  Service: Orthopedics;  Laterality: Left;   AMPUTATION Left 03/18/2021   Procedure: LEFT BELOW KNEE AMPUTATION;  Surgeon: Newt Minion, MD;  Location: [place] [facility];  Service: Orthopedics;  Laterality: Left;   AMPUTATION TOE Left 06/23/2019   Procedure: AMPUTATION TOE LEFT AMPUTATION;  Surgeon: Sharlotte Alamo, DPM;  Location: [facility] ORS;  Service: Podiatry;  Laterality: Left;   AMPUTATION TOE Left 08/31/2019   Procedure: AMPUTATION TOE  IPJ [id number];  Surgeon: Sharlotte Alamo, DPM;  Location: [facility] ORS;  Service: Podiatry;  Laterality: Left;   CYSTOSCOPY  2015   Biopsy   HERNIA REPAIR Right 2008   [place]   REVERSE SHOULDER ARTHROPLASTY Right 09/18/2020   Procedure: RIGHT REVERSE  SHOULDER ARTHROPLASTY;  Surgeon: Meredith Pel, MD;  Location: [facility];  Service: Orthopedics;  Laterality: Right;   Social History   Occupational History   Occupation: unemployed  Tobacco Use   Smoking status: Every Day    Packs/day: 0.25    Years: 43.00    Pack years: 10.75    Types: Cigarettes   Smokeless tobacco: Never   Tobacco comments:    cut down to quarter pack a day, aware of resources  Vaping Use   Vaping Use: Never used  Substance and Sexual Activity   Alcohol use: Not Currently    Alcohol/week: 2.0 standard  drinks    Types: 2 Cans of beer per week    Comment: last use May 2022   Drug use: Not Currently    Frequency: 7.0 times per week    Types: Marijuana    Comment: last use was 06/04/2021   Sexual activity: Yes

## 2022-04-26 ENCOUNTER — Ambulatory Visit: Payer: Medicaid Other | Admitting: Orthopedic Surgery

## 2022-06-23 ENCOUNTER — Other Ambulatory Visit: Payer: Self-pay

## 2022-06-23 ENCOUNTER — Emergency Department: Payer: Medicaid Other

## 2022-06-23 ENCOUNTER — Emergency Department
Admission: EM | Admit: 2022-06-23 | Discharge: 2022-06-23 | Disposition: A | Discharge status: Home / Self Care | Payer: Medicaid Other | Attending: Emergency Medicine | Admitting: Emergency Medicine

## 2022-06-23 DIAGNOSIS — W06XXXA Fall from bed, initial encounter: Secondary | ICD-10-CM | POA: Diagnosis not present

## 2022-06-23 DIAGNOSIS — E119 Type 2 diabetes mellitus without complications: Secondary | ICD-10-CM | POA: Insufficient documentation

## 2022-06-23 DIAGNOSIS — M25561 Pain in right knee: Secondary | ICD-10-CM

## 2022-06-23 DIAGNOSIS — I10 Essential (primary) hypertension: Secondary | ICD-10-CM | POA: Diagnosis not present

## 2022-06-23 DIAGNOSIS — L0390 Cellulitis, unspecified: Secondary | ICD-10-CM

## 2022-06-23 DIAGNOSIS — M25562 Pain in left knee: Secondary | ICD-10-CM | POA: Diagnosis present

## 2022-06-23 DIAGNOSIS — Y92009 Unspecified place in unspecified non-institutional (private) residence as the place of occurrence of the external cause: Secondary | ICD-10-CM | POA: Diagnosis not present

## 2022-06-23 DIAGNOSIS — Z5321 Procedure and treatment not carried out due to patient leaving prior to being seen by health care provider: Secondary | ICD-10-CM | POA: Diagnosis not present

## 2022-06-23 LAB — CBC WITH DIFFERENTIAL/PLATELET
Abs Immature Granulocytes: 0.1 10*3/uL — ABNORMAL HIGH (ref 0.00–0.07)
Basophils Absolute: 0.1 10*3/uL (ref 0.0–0.1)
Basophils Relative: 0 %
Eosinophils Absolute: 0.1 10*3/uL (ref 0.0–0.5)
Eosinophils Relative: 0 %
HCT: 48.2 % (ref 39.0–52.0)
Hemoglobin: 16.5 g/dL (ref 13.0–17.0)
Immature Granulocytes: 1 %
Lymphocytes Relative: 11 %
Lymphs Abs: 2.1 10*3/uL (ref 0.7–4.0)
MCH: 31 pg (ref 26.0–34.0)
MCHC: 34.2 g/dL (ref 30.0–36.0)
MCV: 90.4 fL (ref 80.0–100.0)
Monocytes Absolute: 1.9 10*3/uL — ABNORMAL HIGH (ref 0.1–1.0)
Monocytes Relative: 10 %
Neutro Abs: 15.1 10*3/uL — ABNORMAL HIGH (ref 1.7–7.7)
Neutrophils Relative %: 78 %
Platelets: 311 10*3/uL (ref 150–400)
RBC: 5.33 MIL/uL (ref 4.22–5.81)
RDW: 13.2 % (ref 11.5–15.5)
WBC: 19.4 10*3/uL — ABNORMAL HIGH (ref 4.0–10.5)
nRBC: 0 % (ref 0.0–0.2)

## 2022-06-23 LAB — BASIC METABOLIC PANEL
Anion gap: 9 (ref 5–15)
BUN: 15 mg/dL (ref 6–20)
CO2: 28 mmol/L (ref 22–32)
Calcium: 10.1 mg/dL (ref 8.9–10.3)
Chloride: 102 mmol/L (ref 98–111)
Creatinine, Ser: 0.89 mg/dL (ref 0.61–1.24)
GFR, Estimated: >60 mL/min (ref >60)
Glucose, Bld: 142 mg/dL — ABNORMAL HIGH (ref 70–99)
Potassium: 4.1 mmol/L (ref 3.5–5.1)
Sodium: 139 mmol/L (ref 135–145)

## 2022-06-23 LAB — LACTIC ACID, PLASMA: Lactic Acid, Venous: 1.2 mmol/L (ref 0.5–1.9)

## 2022-06-23 LAB — URIC ACID: Uric Acid, Serum: 5.2 mg/dL (ref 3.7–8.6)

## 2022-06-23 MED ORDER — LIDOCAINE-EPINEPHRINE 2 %-1:100000 IJ SOLN
20.0000 mL | Freq: Once | INTRAMUSCULAR | Status: AC
  Administered 2022-06-23: 20 mL
  Filled 2022-06-23: qty 1

## 2022-06-23 MED ORDER — MORPHINE SULFATE (PF) 2 MG/ML IV SOLN
2.0000 mg | Freq: Once | INTRAVENOUS | Status: DC
  Filled 2022-06-23: qty 1

## 2022-06-23 MED ORDER — CEPHALEXIN 500 MG PO CAPS: 500.0000 mg | ORAL_CAPSULE | Freq: Four times a day (QID) | ORAL | 0 refills | Status: DC

## 2022-06-23 MED ORDER — CEPHALEXIN 500 MG PO CAPS: 500.0000 mg | ORAL_CAPSULE | Freq: Four times a day (QID) | ORAL | 0 refills | Status: AC

## 2022-06-23 MED ORDER — HYDROMORPHONE HCL 1 MG/ML IJ SOLN
0.5000 mg | Freq: Once | INTRAMUSCULAR | Status: AC
  Administered 2022-06-23: 0.5 mg via INTRAVENOUS
  Filled 2022-06-23: qty 0.5

## 2022-06-23 NOTE — Discharge Instructions (Signed)
You have cellulitis which is an infection of the skin of your knee.  I recommended admission for IV antibiotics however you are choosing to be discharged.  I have written you a prescription for an antibiotic which she should take 4 times a day for the next 7 days.  Please return to the emergency department for any spread of the redness fevers or worsening pain.

## 2022-06-23 NOTE — ED Triage Notes (Addendum)
Pt here via [facility] with left knee pain x1 day. Pt fell out the bed. Pt has hx of DM and HTN.   336-cbg 196/132

## 2022-06-23 NOTE — ED Notes (Signed)
Chart labels used, s/q not working.

## 2022-06-23 NOTE — ED Notes (Signed)
Pt given urinal and call bell at this time. No needs verbalized.

## 2022-06-23 NOTE — ED Provider Notes (Signed)
[facility] Provider Note    Event Date/Time   First MD Initiated Contact with Patient 06/23/22 1344     (approximate)   History   Knee Pain   HPI  Justin Landry is a 61 y.o. male   presents to the ED via EMS with complaint of left knee pain for 1 day.  Patient states that he was at a friend's house and fell out of the bed.  Patient is a BKA amputee on the left.  Patient has history of diabetes and hypertension.  He states he has not taken any of his medication today.  He continues to use his prosthesis to ambulate.  Patient has a history of diabetes, hypertension, left BKA with history of osteomyelitis,  arthritis, depression, neuromuscular disorder.        Physical Exam   Triage Vital Signs: ED Triage Vitals  Enc Vitals Group     BP 06/23/22 1246 (!) 165/96     Pulse Rate 06/23/22 1246 90     Resp 06/23/22 1246 18     Temp 06/23/22 1246 98.7 F (37.1 C)     Temp Source 06/23/22 1246 Oral     SpO2 06/23/22 1246 97 %     Weight 06/23/22 1249 210 lb 1.6 oz (95.3 kg)     Height 06/23/22 1249 5\' 7"  (1.702 m)     Head Circumference --      Peak Flow --      Pain Score 06/23/22 1248 7     Pain Loc --      Pain Edu? --      Excl. in GC? --     Most recent vital signs: Vitals:   06/23/22 1246 06/23/22 1635  BP: (!) 165/96 (!) 175/96  Pulse: 90 81  Resp: 18 19  Temp: 98.7 F (37.1 C) 98.1 F (36.7 C)  SpO2: 97% 94%     General: Awake, no distress.  CV:  Good peripheral perfusion.  Resp:  Normal effort.  Abd:  No distention.  Other:  Left lower extremity BKA.  Anterior knee is erythematous and tender to palpation.  Warm in comparison to the right lower extremity.  Skin is intact.  There is a 3 cm erythematous linear area that is questionable however patient states that he has been scratching it.  The stump itself has no erythema or warmth to it.  No drainage is noted from the site.   ED Results / Procedures / Treatments    Labs (all labs ordered are listed, but only abnormal results are displayed) Labs Reviewed  CBC WITH DIFFERENTIAL/PLATELET - Abnormal; Notable for the following components:      Result Value   WBC 19.4 (*)    Neutro Abs 15.1 (*)    Monocytes Absolute 1.9 (*)    Abs Immature Granulocytes 0.10 (*)    All other components within normal limits  BASIC METABOLIC PANEL - Abnormal; Notable for the following components:   Glucose, Bld 142 (*)    All other components within normal limits  URIC ACID  LACTIC ACID, PLASMA  LACTIC ACID, PLASMA      RADIOLOGY  Left knee x-ray images were reviewed by myself and no evidence of fracture.  Radiology report also mentions soft tissue swelling.   PROCEDURES:  Critical Care performed:   Procedures   MEDICATIONS ORDERED IN ED: Medications - No data to display   IMPRESSION / MDM / ASSESSMENT AND PLAN / ED COURSE  I reviewed the triage vital signs and the nursing notes.   Differential diagnosis includes, but is not limited to, contusion left knee, cellulitis left knee, acute gout, septic joint.  ----------------------------------------- 4:38 PM on 06/23/2022 ----------------------------------------- At this time the care of this patient has been discussed with Dr. Sidney Ace and care has been transferred to her.  Concerning for cellulitis versus septic joint secondary to a elevated WBC of 19.4 and being diabetic.     Patient's presentation is most consistent with acute presentation with potential threat to life or bodily function.  FINAL CLINICAL IMPRESSION(S) / ED DIAGNOSES   Final diagnoses:  None     Rx / DC Orders   ED Discharge Orders     None        Note:  This document was prepared using Dragon voice recognition software and may include unintentional dictation errors.   Tommi Rumps, PA-C 06/23/22 1640    Georga Hacking, MD 06/23/22 1754    Georga Hacking, MD 06/23/22 1755

## 2022-06-29 ENCOUNTER — Encounter (HOSPITAL_COMMUNITY): Payer: Self-pay | Admitting: Emergency Medicine

## 2022-06-29 ENCOUNTER — Emergency Department (HOSPITAL_COMMUNITY)
Admission: EM | Admit: 2022-06-29 | Discharge: 2022-06-29 | Disposition: A | Discharge status: Home / Self Care | Payer: Medicaid Other | Attending: Emergency Medicine | Admitting: Emergency Medicine

## 2022-06-29 ENCOUNTER — Emergency Department (HOSPITAL_COMMUNITY): Payer: Medicaid Other

## 2022-06-29 ENCOUNTER — Ambulatory Visit: Payer: Medicaid Other | Admitting: Orthopedic Surgery

## 2022-06-29 ENCOUNTER — Other Ambulatory Visit: Payer: Self-pay

## 2022-06-29 DIAGNOSIS — S81002A Unspecified open wound, left knee, initial encounter: Secondary | ICD-10-CM | POA: Diagnosis not present

## 2022-06-29 DIAGNOSIS — Z5321 Procedure and treatment not carried out due to patient leaving prior to being seen by health care provider: Secondary | ICD-10-CM | POA: Diagnosis not present

## 2022-06-29 DIAGNOSIS — E119 Type 2 diabetes mellitus without complications: Secondary | ICD-10-CM | POA: Diagnosis not present

## 2022-06-29 DIAGNOSIS — S8992XA Unspecified injury of left lower leg, initial encounter: Secondary | ICD-10-CM | POA: Diagnosis present

## 2022-06-29 DIAGNOSIS — W06XXXA Fall from bed, initial encounter: Secondary | ICD-10-CM | POA: Insufficient documentation

## 2022-06-29 LAB — CBC WITH DIFFERENTIAL/PLATELET
Abs Immature Granulocytes: 0.07 10*3/uL (ref 0.00–0.07)
Basophils Absolute: 0.1 10*3/uL (ref 0.0–0.1)
Basophils Relative: 1 %
Eosinophils Absolute: 0.2 10*3/uL (ref 0.0–0.5)
Eosinophils Relative: 1 %
HCT: 49.5 % (ref 39.0–52.0)
Hemoglobin: 16.6 g/dL (ref 13.0–17.0)
Immature Granulocytes: 0 %
Lymphocytes Relative: 14 %
Lymphs Abs: 2.3 10*3/uL (ref 0.7–4.0)
MCH: 30.6 pg (ref 26.0–34.0)
MCHC: 33.5 g/dL (ref 30.0–36.0)
MCV: 91.2 fL (ref 80.0–100.0)
Monocytes Absolute: 1.2 10*3/uL — ABNORMAL HIGH (ref 0.1–1.0)
Monocytes Relative: 7 %
Neutro Abs: 12.7 10*3/uL — ABNORMAL HIGH (ref 1.7–7.7)
Neutrophils Relative %: 77 %
Platelets: 478 10*3/uL — ABNORMAL HIGH (ref 150–400)
RBC: 5.43 MIL/uL (ref 4.22–5.81)
RDW: 12.4 % (ref 11.5–15.5)
WBC: 16.5 10*3/uL — ABNORMAL HIGH (ref 4.0–10.5)
nRBC: 0 % (ref 0.0–0.2)

## 2022-06-29 LAB — COMPREHENSIVE METABOLIC PANEL
ALT: 13 U/L (ref 0–44)
AST: 13 U/L — ABNORMAL LOW (ref 15–41)
Albumin: 3.7 g/dL (ref 3.5–5.0)
Alkaline Phosphatase: 64 U/L (ref 38–126)
Anion gap: 12 (ref 5–15)
BUN: 11 mg/dL (ref 6–20)
CO2: 25 mmol/L (ref 22–32)
Calcium: 10.2 mg/dL (ref 8.9–10.3)
Chloride: 102 mmol/L (ref 98–111)
Creatinine, Ser: 0.85 mg/dL (ref 0.61–1.24)
GFR, Estimated: >60 mL/min (ref >60)
Glucose, Bld: 134 mg/dL — ABNORMAL HIGH (ref 70–99)
Potassium: 4.9 mmol/L (ref 3.5–5.1)
Sodium: 139 mmol/L (ref 135–145)
Total Bilirubin: 0.4 mg/dL (ref 0.3–1.2)
Total Protein: 7.6 g/dL (ref 6.5–8.1)

## 2022-06-29 LAB — LACTIC ACID, PLASMA: Lactic Acid, Venous: 2.8 mmol/L (ref 0.5–1.9)

## 2022-06-29 NOTE — ED Triage Notes (Signed)
Pt to triage via [facility] from friend's house.   Seen at [facility] ED 7/12 and is taking Keflex for leg wound after falling out of bed.  Pt has left below knee amputation.  Drainage and redness noted to L knee.

## 2022-06-29 NOTE — ED Notes (Signed)
Lactic 2.8 

## 2022-06-29 NOTE — ED Provider Triage Note (Cosign Needed Addendum)
Emergency Medicine Provider Triage Evaluation Note  Justin Landry , a 61 y.o. male  was evaluated in triage.  Pt complains of left knee wound. States that on 7/11 he fell out of bed onto the knee and was seen at [facility] on 7/12 and given Keflex for his wound. States that it got some better after that but then immediately worsened again soon after that. States it has been draining fluid. Denies fevers. Patient is diabetic  Review of Systems  Positive:  Negative:   Physical Exam  BP (!) 165/112 (BP Location: Right Arm)   Pulse 86   Temp 98.3 F (36.8 C) (Oral)   Resp 16   SpO2 98%  Gen:   Awake, no distress   Resp:  Normal effort  MSK:   Moves extremities without difficulty  Other:  Erythematous wound noted to the anterior left knee that is swollen and warm. Some fluctuance noted as well. Palpation drains what appears to be serosanguinous fluid.   Medical Decision Making  Medically screening exam initiated at 12:44 PM.  Appropriate orders placed.  Mittie Bodo was informed that the remainder of the evaluation will be completed by another provider, this initial triage assessment does not replace that evaluation, and the importance of remaining in the ED until their evaluation is complete.  Of note, after patient has been waiting for 5 hours to be see he states that he is going to leave AMA. I reviewed his chart and saw that his lactic is 2.8 and his WBC count is 16.5. Knee x-ray does not show osteomyelitis at this time. I have discussed these findings with the patient and discussed that given he has already been on antibiotics and wound is getting worse, he will likely need admission with IV antibiotics once he is able to be seen and have a formal evaluation. I told him that leaving AMA could result him development of sepsis, further leg amputation, and even death. We discussed the nature and purpose, risks and benefits, as well as, the alternatives of treatment. Time was given to  allow the opportunity to ask questions and consider their options, and after the discussion, the patient decided to refuse the offerred treatment. The patient was informed that refusal could lead to, but was not limited to, death, permanent disability, or severe pain. Prior to refusing, I determined that the patient had the capacity to make their decision and understood the consequences of that decision. After refusal, I made every reasonable opportunity to treat them to the best of my ability.  The patient was notified that they may return to the emergency department at any time for further treatment. Patient states he will return in the morning. He has been made aware that he will likely still have an extended wait time.      Silva Bandy, PA-C 06/29/22 1247    Jeannetta Cerutti, Shawn Route, PA-C 06/29/22 1724

## 2022-06-30 ENCOUNTER — Ambulatory Visit (INDEPENDENT_AMBULATORY_CARE_PROVIDER_SITE_OTHER): Payer: Medicaid Other | Admitting: Family

## 2022-06-30 ENCOUNTER — Encounter: Payer: Self-pay | Admitting: Family

## 2022-06-30 DIAGNOSIS — L03116 Cellulitis of left lower limb: Secondary | ICD-10-CM

## 2022-06-30 DIAGNOSIS — M7052 Other bursitis of knee, left knee: Secondary | ICD-10-CM

## 2022-06-30 MED ORDER — DOXYCYCLINE HYCLATE 100 MG PO TABS: 100.0000 mg | ORAL_TABLET | Freq: Two times a day (BID) | ORAL | 0 refills | Status: DC

## 2022-06-30 NOTE — Progress Notes (Signed)
/  Office Visit Note   Patient: Justin Landry           Date of Birth: 1961-01-14           MRN: [id number] Visit Date: 06/30/2022              Requested by: Care, [facility] [address] [place],  [place] [address] PCP: Care, [facility]  Chief Complaint  Patient presents with   Left Leg - Wound Check    Hx BKA      HPI: The patient is a 61 year old gentleman who presents today for evaluation of a wound to his left knee after a fall out of bed on July 11.  He fell directly onto his knee and has had pain since he developed redness with streaking to the anterior lateral aspect of his knee went to the emergency department on July 12 at this time it was recommended that he be admitted for IV antibiotics the patient declined he completed a course of Keflex.  Today he reports he has only gotten worse since the 12th he has been taking the Keflex  He is now having anterior pain swelling and redness with some warmth.  There is drainage he denies fever chills  Assessment & Plan: Visit Diagnoses:  1. Cellulitis of knee, left     Plan: Prepatellar abscess.  Discussed case with Dr. Lajoyce Corners.  We will plan for irrigation and debridement on Friday the patient will be placed on doxycycline today continue with daily Dial soap cleansing discussed strict return precautions  Follow-Up Instructions: No follow-ups on file.   Left Knee Exam   Other  Erythema: present Sensation: normal Swelling: mild Effusion: no effusion present      Patient is alert, oriented, no adenopathy, well-dressed, normal affect, normal respiratory effort. On examination of the left knee there are 5 open areas these are 2 mm in diameter and draining purulent fluid this is minimal to moderate.  There is some surrounding erythema this is not ascending there is warmth and mild edema there is no effusion  Imaging: No results found. No images are attached to the encounter.  Labs: Lab Results   Component Value Date   HGBA1C 6.3 (A) 07/08/2021   HGBA1C 7.5 (H) 03/18/2021   HGBA1C 7.5 (H) 09/24/2020   ESRSEDRATE 2 07/17/2021   CRP 8.3 (H) 07/17/2021   LABURIC 5.2 06/23/2022   REPTSTATUS PENDING 06/29/2022   GRAMSTAIN  06/23/2019    FEW WBC PRESENT, PREDOMINANTLY PMN RARE GRAM POSITIVE COCCI    CULT  06/29/2022    NO GROWTH < 24 HOURS Performed at [facility], [address], [place], [place] [address]    LABORGA LECLERCIA ADECARBOXYLATA 06/23/2019   LABORGA STAPHYLOCOCCUS CAPRAE 06/23/2019   LABORGA STAPHYLOCOCCUS SIMULANS 06/23/2019     Lab Results  Component Value Date   ALBUMIN 3.7 06/29/2022   ALBUMIN 4.2 09/10/2020   ALBUMIN 4.5 07/14/2020    No results found for: "MG" No results found for: "VD25OH"  No results found for: "PREALBUMIN"    Latest Ref Rng & Units 06/29/2022    1:05 PM 06/23/2022    3:20 PM 09/23/2021   10:35 PM  CBC EXTENDED  WBC 4.0 - 10.5 K/uL 16.5  19.4  12.5   RBC 4.22 - 5.81 MIL/uL 5.43  5.33  5.24   Hemoglobin 13.0 - 17.0 g/dL 57.2  62.0  35.5   HCT 39.0 - 52.0 % 49.5  48.2  46.5   Platelets 150 - 400 K/uL 478  311  299   NEUT# 1.7 - 7.7 K/uL 12.7  15.1    Lymph# 0.7 - 4.0 K/uL 2.3  2.1       There is no height or weight on file to calculate BMI.  Orders:  Orders Placed This Encounter  Procedures   Wound culture   No orders of the defined types were placed in this encounter.    Procedures: No procedures performed  Clinical Data: No additional findings.  ROS:  All other systems negative, except as noted in the HPI. Review of Systems  Constitutional: Negative.   Musculoskeletal:  Positive for myalgias.  Skin:  Positive for color change and wound.    Objective: Vital Signs: There were no vitals taken for this visit.  Specialty Comments:  No specialty comments available.  PMFS History: Patient Active Problem List   Diagnosis Date Noted   Type 2 diabetes mellitus with other specified complication  (HCC) 04/21/2021   Acute osteomyelitis of left foot (HCC) 03/18/2021   Cutaneous abscess of left foot    Dehiscence of amputation stump (HCC)    Subacute osteomyelitis of left foot (HCC)    Rupture of operation wound    Subacute osteomyelitis, left ankle and foot (HCC)    Overgrown toenails 10/30/2020   Pre-operative clearance 09/16/2020   Abnormal EKG 09/16/2020   Chronic left shoulder pain 04/10/2020   Toe pain 02/21/2020   Anxiety 02/08/2020   Left knee pain 02/07/2020   Inguinodynia, right 11/13/2019   Elevated lipids 10/04/2019   Diabetic ulcer of toe of left foot associated with diabetes mellitus due to underlying condition (HCC) 08/23/2019   History of right inguinal hernia 08/14/2019   Encounter to establish care 08/14/2019   Anxiety 08/14/2019   Right shoulder pain 08/14/2019   Cellulitis 06/22/2019   Essential hypertension 05/27/2015   Type 2 diabetes mellitus without complication (HCC) 05/27/2015   Diverticulosis of large intestine without hemorrhage 10/16/2014   Recurrent right inguinal hernia 09/27/2014   Tobacco use 08/01/2014   Hematuria, gross 07/03/2014   Lower urinary tract symptoms (LUTS) 07/03/2014   Diabetes mellitus (HCC) 06/25/2014   Inguinal hernia, bilateral  04/10/2014   Past Medical History:  Diagnosis Date   Arthritis    right shoulder   Depression    Diabetes mellitus    Type II   Headache    migraines   High cholesterol    Humerus fracture    right   Hypertension    Inguinal hernia    Neuromuscular disorder (HCC)    neuropathy    Family History  Problem Relation Age of Onset   Diabetes Mother    Hypertension Mother    Diabetes Father    Hypertension Father    Cancer Brother        brain    Past Surgical History:  Procedure Laterality Date   AMPUTATION Left 12/10/2020   Procedure: LEFT GREAT TOE AMPUTATION;  Surgeon: [name], MD;  Location: [facility] OR;  Service: Orthopedics;  Laterality: Left;   AMPUTATION Left 01/21/2021    Procedure: 1ST AND 2ND RAY AMPUTATION LEFT FOOT;  Surgeon: [name], MD;  Location: [facility] OR;  Service: Orthopedics;  Laterality: Left;   AMPUTATION Left 03/18/2021   Procedure: LEFT BELOW KNEE AMPUTATION;  Surgeon: [name], MD;  Location: [facility] OR;  Service: Orthopedics;  Laterality: Left;   AMPUTATION TOE Left 06/23/2019   Procedure: AMPUTATION TOE LEFT  AMPUTATION;  Surgeon: Linus Galas, DPM;  Location: [facility] ORS;  Service: Podiatry;  Laterality: Left;   AMPUTATION TOE Left 08/31/2019   Procedure: AMPUTATION TOE  IPJ 19147;  Surgeon: Linus Galas, DPM;  Location: [facility] ORS;  Service: Podiatry;  Laterality: Left;   CYSTOSCOPY  2015   Biopsy   HERNIA REPAIR Right 2008   [place]   REVERSE SHOULDER ARTHROPLASTY Right 09/18/2020   Procedure: RIGHT REVERSE SHOULDER ARTHROPLASTY;  Surgeon: Cammy Copa, MD;  Location: [facility] OR;  Service: Orthopedics;  Laterality: Right;   Social History   Occupational History   Occupation: unemployed  Tobacco Use   Smoking status: Every Day    Packs/day: 0.25    Years: 43.00    Total pack years: 10.75    Types: Cigarettes   Smokeless tobacco: Never   Tobacco comments:    cut down to quarter pack a day, aware of resources  Vaping Use   Vaping Use: Never used  Substance and Sexual Activity   Alcohol use: Not Currently    Alcohol/week: 2.0 standard drinks of alcohol    Types: 2 Cans of beer per week    Comment: last use May 2022   Drug use: Not Currently    Frequency: 7.0 times per week    Types: Marijuana    Comment: last use was 06/04/2021   Sexual activity: Yes

## 2022-07-01 ENCOUNTER — Other Ambulatory Visit: Payer: Self-pay

## 2022-07-01 ENCOUNTER — Encounter (HOSPITAL_COMMUNITY): Payer: Self-pay | Admitting: Orthopedic Surgery

## 2022-07-01 NOTE — Progress Notes (Signed)
Justin Landry denies chest pain or shortness of breath. Patient is homeless, he reports that he has not been exposed to anyone that has Covid.  Patient is staying with a friend tonight so that he may take a shower in am and put clean clothes on. Patient will spend the night, the friend he is staying with tonight cannot drive. [name] states that he may go to his aunt's in [facility] to recover.  [name] PCP is with [facility], this is a recent change, patient now has medicare.  [name] has type II diabetes, patient states that he does not know where his CBG monitor is.  I instructed patient to not take medication for diabetes in am.

## 2022-07-02 ENCOUNTER — Ambulatory Visit (HOSPITAL_BASED_OUTPATIENT_CLINIC_OR_DEPARTMENT_OTHER): Payer: Medicaid Other | Admitting: Physician Assistant

## 2022-07-02 ENCOUNTER — Inpatient Hospital Stay (HOSPITAL_COMMUNITY)
Admission: RE | Admit: 2022-07-02 | Discharge: 2022-07-05 | DRG: 501 | Disposition: A | Discharge status: Home / Self Care | Payer: Medicaid Other | Attending: Orthopedic Surgery | Admitting: Orthopedic Surgery

## 2022-07-02 ENCOUNTER — Encounter (HOSPITAL_COMMUNITY): Payer: Self-pay | Admitting: Orthopedic Surgery

## 2022-07-02 ENCOUNTER — Other Ambulatory Visit: Payer: Self-pay

## 2022-07-02 ENCOUNTER — Encounter (HOSPITAL_COMMUNITY)
Admission: RE | Disposition: A | Discharge status: Home / Self Care | Payer: Self-pay | Source: Home / Self Care | Attending: Orthopedic Surgery

## 2022-07-02 ENCOUNTER — Ambulatory Visit (HOSPITAL_COMMUNITY): Payer: Medicaid Other | Admitting: Physician Assistant

## 2022-07-02 DIAGNOSIS — Z96611 Presence of right artificial shoulder joint: Secondary | ICD-10-CM | POA: Diagnosis present

## 2022-07-02 DIAGNOSIS — Z886 Allergy status to analgesic agent status: Secondary | ICD-10-CM

## 2022-07-02 DIAGNOSIS — L02416 Cutaneous abscess of left lower limb: Secondary | ICD-10-CM | POA: Diagnosis present

## 2022-07-02 DIAGNOSIS — F17200 Nicotine dependence, unspecified, uncomplicated: Secondary | ICD-10-CM

## 2022-07-02 DIAGNOSIS — Z8249 Family history of ischemic heart disease and other diseases of the circulatory system: Secondary | ICD-10-CM

## 2022-07-02 DIAGNOSIS — F32A Depression, unspecified: Secondary | ICD-10-CM | POA: Diagnosis present

## 2022-07-02 DIAGNOSIS — B9689 Other specified bacterial agents as the cause of diseases classified elsewhere: Secondary | ICD-10-CM | POA: Diagnosis not present

## 2022-07-02 DIAGNOSIS — Z89512 Acquired absence of left leg below knee: Secondary | ICD-10-CM

## 2022-07-02 DIAGNOSIS — Z880 Allergy status to penicillin: Secondary | ICD-10-CM

## 2022-07-02 DIAGNOSIS — F17210 Nicotine dependence, cigarettes, uncomplicated: Secondary | ICD-10-CM | POA: Diagnosis present

## 2022-07-02 DIAGNOSIS — E119 Type 2 diabetes mellitus without complications: Secondary | ICD-10-CM

## 2022-07-02 DIAGNOSIS — I10 Essential (primary) hypertension: Secondary | ICD-10-CM | POA: Diagnosis present

## 2022-07-02 DIAGNOSIS — Z885 Allergy status to narcotic agent status: Secondary | ICD-10-CM

## 2022-07-02 DIAGNOSIS — Z7951 Long term (current) use of inhaled steroids: Secondary | ICD-10-CM

## 2022-07-02 DIAGNOSIS — E7800 Pure hypercholesterolemia, unspecified: Secondary | ICD-10-CM | POA: Diagnosis present

## 2022-07-02 DIAGNOSIS — Z79899 Other long term (current) drug therapy: Secondary | ICD-10-CM

## 2022-07-02 DIAGNOSIS — F909 Attention-deficit hyperactivity disorder, unspecified type: Secondary | ICD-10-CM | POA: Diagnosis present

## 2022-07-02 DIAGNOSIS — B9561 Methicillin susceptible Staphylococcus aureus infection as the cause of diseases classified elsewhere: Secondary | ICD-10-CM | POA: Diagnosis present

## 2022-07-02 DIAGNOSIS — W06XXXA Fall from bed, initial encounter: Secondary | ICD-10-CM | POA: Diagnosis present

## 2022-07-02 DIAGNOSIS — Z6832 Body mass index (BMI) 32.0-32.9, adult: Secondary | ICD-10-CM

## 2022-07-02 DIAGNOSIS — Z888 Allergy status to other drugs, medicaments and biological substances status: Secondary | ICD-10-CM

## 2022-07-02 DIAGNOSIS — M71162 Other infective bursitis, left knee: Secondary | ICD-10-CM | POA: Diagnosis not present

## 2022-07-02 DIAGNOSIS — E669 Obesity, unspecified: Secondary | ICD-10-CM | POA: Diagnosis present

## 2022-07-02 DIAGNOSIS — M7042 Prepatellar bursitis, left knee: Secondary | ICD-10-CM | POA: Diagnosis present

## 2022-07-02 DIAGNOSIS — E1140 Type 2 diabetes mellitus with diabetic neuropathy, unspecified: Secondary | ICD-10-CM | POA: Diagnosis present

## 2022-07-02 DIAGNOSIS — L03116 Cellulitis of left lower limb: Secondary | ICD-10-CM | POA: Diagnosis present

## 2022-07-02 DIAGNOSIS — Z833 Family history of diabetes mellitus: Secondary | ICD-10-CM

## 2022-07-02 DIAGNOSIS — Z7984 Long term (current) use of oral hypoglycemic drugs: Secondary | ICD-10-CM

## 2022-07-02 HISTORY — DX: Attention-deficit hyperactivity disorder, unspecified type: F90.9

## 2022-07-02 HISTORY — PX: I & D EXTREMITY: SHX5045

## 2022-07-02 LAB — GLUCOSE, CAPILLARY
Glucose-Capillary: 122 mg/dL — ABNORMAL HIGH (ref 70–99)
Glucose-Capillary: 104 mg/dL — ABNORMAL HIGH (ref 70–99)
Glucose-Capillary: 118 mg/dL — ABNORMAL HIGH (ref 70–99)

## 2022-07-02 SURGERY — IRRIGATION AND DEBRIDEMENT EXTREMITY
Anesthesia: General | Laterality: Left

## 2022-07-02 MED ORDER — OXYCODONE HCL 5 MG PO TABS: 5.0000 mg | ORAL_TABLET | Freq: Once | ORAL | Status: DC | PRN

## 2022-07-02 MED ORDER — HYDROXYZINE PAMOATE 50 MG PO CAPS
50.0000 mg | ORAL_CAPSULE | Freq: Three times a day (TID) | ORAL | Status: DC
  Filled 2022-07-02: qty 1

## 2022-07-02 MED ORDER — HYDRALAZINE HCL 20 MG/ML IJ SOLN
10.0000 mg | Freq: Once | INTRAMUSCULAR | Status: AC
  Administered 2022-07-02: 10 mg via INTRAVENOUS

## 2022-07-02 MED ORDER — ACETAMINOPHEN 500 MG PO TABS
ORAL_TABLET | ORAL | Status: AC
  Administered 2022-07-02: 1000 mg via ORAL
  Filled 2022-07-02: qty 2

## 2022-07-02 MED ORDER — ONDANSETRON HCL 4 MG PO TABS: 4.0000 mg | ORAL_TABLET | Freq: Four times a day (QID) | ORAL | Status: DC | PRN

## 2022-07-02 MED ORDER — METOCLOPRAMIDE HCL 5 MG PO TABS: 5.0000 mg | ORAL_TABLET | Freq: Three times a day (TID) | ORAL | Status: DC | PRN

## 2022-07-02 MED ORDER — OXYCODONE HCL 5 MG PO TABS
ORAL_TABLET | ORAL | Status: AC
  Filled 2022-07-02: qty 3

## 2022-07-02 MED ORDER — NICOTINE 21 MG/24HR TD PT24
21.0000 mg | MEDICATED_PATCH | Freq: Every day | TRANSDERMAL | Status: DC
  Administered 2022-07-02: 21 mg via TRANSDERMAL
  Filled 2022-07-02 (×3): qty 1

## 2022-07-02 MED ORDER — METOCLOPRAMIDE HCL 5 MG/ML IJ SOLN: 5.0000 mg | Freq: Three times a day (TID) | INTRAMUSCULAR | Status: DC | PRN

## 2022-07-02 MED ORDER — METFORMIN HCL 500 MG PO TABS
1000.0000 mg | ORAL_TABLET | Freq: Two times a day (BID) | ORAL | Status: DC
Start: 2022-07-03 — End: 2022-07-05
  Administered 2022-07-03 – 2022-07-05 (×5): 1000 mg via ORAL
  Filled 2022-07-02 (×5): qty 2

## 2022-07-02 MED ORDER — LABETALOL HCL 5 MG/ML IV SOLN
INTRAVENOUS | Status: DC | PRN
  Administered 2022-07-02: 5 mg via INTRAVENOUS

## 2022-07-02 MED ORDER — LABETALOL HCL 5 MG/ML IV SOLN
INTRAVENOUS | Status: AC
  Filled 2022-07-02: qty 4

## 2022-07-02 MED ORDER — HYDRALAZINE HCL 20 MG/ML IJ SOLN
INTRAMUSCULAR | Status: AC
  Filled 2022-07-02: qty 1

## 2022-07-02 MED ORDER — CHLORHEXIDINE GLUCONATE 0.12 % MT SOLN
OROMUCOSAL | Status: AC
  Administered 2022-07-02: 15 mL via OROMUCOSAL
  Filled 2022-07-02: qty 15

## 2022-07-02 MED ORDER — LOSARTAN POTASSIUM 50 MG PO TABS
100.0000 mg | ORAL_TABLET | Freq: Every day | ORAL | Status: DC
  Administered 2022-07-03 – 2022-07-05 (×3): 100 mg via ORAL
  Filled 2022-07-02 (×3): qty 2

## 2022-07-02 MED ORDER — OXYCODONE HCL 5 MG PO TABS
10.0000 mg | ORAL_TABLET | ORAL | Status: DC | PRN
  Administered 2022-07-02 – 2022-07-05 (×13): 15 mg via ORAL
  Filled 2022-07-02 (×11): qty 3
  Filled 2022-07-02: qty 2
  Filled 2022-07-02: qty 3

## 2022-07-02 MED ORDER — INSULIN ASPART 100 UNIT/ML IJ SOLN: 0.0000 [IU] | INTRAMUSCULAR | Status: DC | PRN

## 2022-07-02 MED ORDER — 0.9 % SODIUM CHLORIDE (POUR BTL) OPTIME
TOPICAL | Status: DC | PRN
  Administered 2022-07-02: 1000 mL

## 2022-07-02 MED ORDER — FENTANYL CITRATE (PF) 250 MCG/5ML IJ SOLN
INTRAMUSCULAR | Status: AC
  Filled 2022-07-02: qty 5

## 2022-07-02 MED ORDER — INSULIN ASPART 100 UNIT/ML IJ SOLN
0.0000 [IU] | Freq: Three times a day (TID) | INTRAMUSCULAR | Status: DC
  Administered 2022-07-03 (×2): 3 [IU] via SUBCUTANEOUS
  Administered 2022-07-03 – 2022-07-05 (×4): 2 [IU] via SUBCUTANEOUS

## 2022-07-02 MED ORDER — GABAPENTIN 400 MG PO CAPS
400.0000 mg | ORAL_CAPSULE | Freq: Three times a day (TID) | ORAL | Status: DC
  Administered 2022-07-02 – 2022-07-05 (×8): 400 mg via ORAL
  Filled 2022-07-02 (×8): qty 1

## 2022-07-02 MED ORDER — LABETALOL HCL 5 MG/ML IV SOLN
10.0000 mg | Freq: Once | INTRAVENOUS | Status: AC
  Administered 2022-07-02: 10 mg via INTRAVENOUS

## 2022-07-02 MED ORDER — METHOCARBAMOL 1000 MG/10ML IJ SOLN: 500.0000 mg | Freq: Four times a day (QID) | INTRAVENOUS | Status: DC | PRN

## 2022-07-02 MED ORDER — ONDANSETRON HCL 4 MG/2ML IJ SOLN: 4.0000 mg | Freq: Four times a day (QID) | INTRAMUSCULAR | Status: DC | PRN

## 2022-07-02 MED ORDER — ORAL CARE MOUTH RINSE: 15.0000 mL | Freq: Once | OROMUCOSAL | Status: AC

## 2022-07-02 MED ORDER — ACETAMINOPHEN 500 MG PO TABS: 1000.0000 mg | ORAL_TABLET | Freq: Once | ORAL | Status: AC

## 2022-07-02 MED ORDER — FENTANYL CITRATE (PF) 100 MCG/2ML IJ SOLN
25.0000 ug | INTRAMUSCULAR | Status: DC | PRN
  Administered 2022-07-02 (×3): 50 ug via INTRAVENOUS

## 2022-07-02 MED ORDER — ONDANSETRON HCL 4 MG/2ML IJ SOLN
INTRAMUSCULAR | Status: DC | PRN
  Administered 2022-07-02: 4 mg via INTRAVENOUS

## 2022-07-02 MED ORDER — LIDOCAINE 2% (20 MG/ML) 5 ML SYRINGE
INTRAMUSCULAR | Status: DC | PRN
  Administered 2022-07-02: 100 mg via INTRAVENOUS

## 2022-07-02 MED ORDER — HYDROMORPHONE HCL 1 MG/ML IJ SOLN
0.2500 mg | INTRAMUSCULAR | Status: DC | PRN
  Administered 2022-07-02 (×4): 0.5 mg via INTRAVENOUS

## 2022-07-02 MED ORDER — SODIUM CHLORIDE 0.9 % IV SOLN: INTRAVENOUS | Status: DC

## 2022-07-02 MED ORDER — ACETAMINOPHEN 325 MG PO TABS
325.0000 mg | ORAL_TABLET | Freq: Four times a day (QID) | ORAL | Status: DC | PRN
  Administered 2022-07-05: 650 mg via ORAL
  Filled 2022-07-02: qty 2

## 2022-07-02 MED ORDER — OXYCODONE HCL 5 MG/5ML PO SOLN: 5.0000 mg | Freq: Once | ORAL | Status: DC | PRN

## 2022-07-02 MED ORDER — CEFAZOLIN SODIUM-DEXTROSE 2-4 GM/100ML-% IV SOLN
2.0000 g | Freq: Three times a day (TID) | INTRAVENOUS | Status: DC
  Administered 2022-07-02 – 2022-07-04 (×5): 2 g via INTRAVENOUS
  Filled 2022-07-02 (×5): qty 100

## 2022-07-02 MED ORDER — CHLORHEXIDINE GLUCONATE 0.12 % MT SOLN
15.0000 mL | Freq: Once | OROMUCOSAL | Status: AC
Start: 2022-07-02 — End: 2022-07-02

## 2022-07-02 MED ORDER — HYDROMORPHONE HCL 1 MG/ML IJ SOLN
INTRAMUSCULAR | Status: AC
  Filled 2022-07-02: qty 1

## 2022-07-02 MED ORDER — FENTANYL CITRATE (PF) 100 MCG/2ML IJ SOLN
INTRAMUSCULAR | Status: DC | PRN
  Administered 2022-07-02 (×5): 50 ug via INTRAVENOUS

## 2022-07-02 MED ORDER — FENTANYL CITRATE (PF) 100 MCG/2ML IJ SOLN
INTRAMUSCULAR | Status: AC
  Filled 2022-07-02: qty 2

## 2022-07-02 MED ORDER — DEXAMETHASONE SODIUM PHOSPHATE 4 MG/ML IJ SOLN
INTRAMUSCULAR | Status: DC | PRN
  Administered 2022-07-02: 4 mg via INTRAVENOUS

## 2022-07-02 MED ORDER — MIDAZOLAM HCL 5 MG/5ML IJ SOLN
INTRAMUSCULAR | Status: DC | PRN
  Administered 2022-07-02: 2 mg via INTRAVENOUS

## 2022-07-02 MED ORDER — PROPOFOL 10 MG/ML IV BOLUS
INTRAVENOUS | Status: DC | PRN
  Administered 2022-07-02: 180 mg via INTRAVENOUS

## 2022-07-02 MED ORDER — OXYCODONE HCL 5 MG PO TABS
5.0000 mg | ORAL_TABLET | ORAL | Status: DC | PRN
  Administered 2022-07-02: 10 mg via ORAL

## 2022-07-02 MED ORDER — CEFAZOLIN SODIUM-DEXTROSE 2-4 GM/100ML-% IV SOLN
2.0000 g | INTRAVENOUS | Status: AC
  Administered 2022-07-02: 2 g via INTRAVENOUS

## 2022-07-02 MED ORDER — MIDAZOLAM HCL 2 MG/2ML IJ SOLN
INTRAMUSCULAR | Status: AC
  Filled 2022-07-02: qty 2

## 2022-07-02 MED ORDER — HYDROXYZINE HCL 25 MG PO TABS
50.0000 mg | ORAL_TABLET | Freq: Three times a day (TID) | ORAL | Status: DC
  Administered 2022-07-02 – 2022-07-05 (×8): 50 mg via ORAL
  Filled 2022-07-02 (×8): qty 2

## 2022-07-02 MED ORDER — POLYETHYLENE GLYCOL 3350 17 G PO PACK: 17.0000 g | PACK | Freq: Every day | ORAL | Status: DC | PRN

## 2022-07-02 MED ORDER — METHOCARBAMOL 500 MG PO TABS
500.0000 mg | ORAL_TABLET | Freq: Four times a day (QID) | ORAL | Status: DC | PRN
  Administered 2022-07-03 – 2022-07-05 (×6): 500 mg via ORAL
  Filled 2022-07-02 (×6): qty 1

## 2022-07-02 MED ORDER — AMISULPRIDE (ANTIEMETIC) 5 MG/2ML IV SOLN: 10.0000 mg | Freq: Once | INTRAVENOUS | Status: DC | PRN

## 2022-07-02 MED ORDER — LACTATED RINGERS IV SOLN
INTRAVENOUS | Status: DC
Start: 2022-07-02 — End: 2022-07-02

## 2022-07-02 MED ORDER — ONDANSETRON HCL 4 MG/2ML IJ SOLN: 4.0000 mg | Freq: Once | INTRAMUSCULAR | Status: DC | PRN

## 2022-07-02 MED ORDER — HYDROMORPHONE HCL 1 MG/ML IJ SOLN
0.5000 mg | INTRAMUSCULAR | Status: DC | PRN
  Administered 2022-07-03: 1 mg via INTRAVENOUS
  Filled 2022-07-02: qty 1

## 2022-07-02 MED ORDER — BISACODYL 10 MG RE SUPP: 10.0000 mg | Freq: Every day | RECTAL | Status: DC | PRN

## 2022-07-02 MED ORDER — PHENYLEPHRINE 80 MCG/ML (10ML) SYRINGE FOR IV PUSH (FOR BLOOD PRESSURE SUPPORT)
PREFILLED_SYRINGE | INTRAVENOUS | Status: DC | PRN
  Administered 2022-07-02: 80 ug via INTRAVENOUS

## 2022-07-02 MED ORDER — DOCUSATE SODIUM 100 MG PO CAPS
100.0000 mg | ORAL_CAPSULE | Freq: Two times a day (BID) | ORAL | Status: DC
  Administered 2022-07-02 – 2022-07-03 (×3): 100 mg via ORAL
  Filled 2022-07-02 (×5): qty 1

## 2022-07-02 MED ORDER — CEFAZOLIN SODIUM-DEXTROSE 2-4 GM/100ML-% IV SOLN
INTRAVENOUS | Status: AC
  Filled 2022-07-02: qty 100

## 2022-07-02 SURGICAL SUPPLY — 34 items
BAG COUNTER SPONGE SURGICOUNT (BAG) IMPLANT
BLADE SURG 21 STRL SS (BLADE) ×2 IMPLANT
BNDG COHESIVE 1X5 TAN STRL LF (GAUZE/BANDAGES/DRESSINGS) ×1 IMPLANT
BNDG COHESIVE 6X5 TAN STRL LF (GAUZE/BANDAGES/DRESSINGS) IMPLANT
BNDG GAUZE ELAST 4 BULKY (GAUZE/BANDAGES/DRESSINGS) ×4 IMPLANT
COVER SURGICAL LIGHT HANDLE (MISCELLANEOUS) ×4 IMPLANT
DRAPE U-SHAPE 47X51 STRL (DRAPES) ×2 IMPLANT
DRSG ADAPTIC 3X8 NADH LF (GAUZE/BANDAGES/DRESSINGS) ×2 IMPLANT
DURAPREP 26ML APPLICATOR (WOUND CARE) ×2 IMPLANT
ELECT REM PT RETURN 9FT ADLT (ELECTROSURGICAL)
ELECTRODE REM PT RTRN 9FT ADLT (ELECTROSURGICAL) IMPLANT
GAUZE PACKING 2X5 YD STRL (GAUZE/BANDAGES/DRESSINGS) ×1 IMPLANT
GAUZE SPONGE 4X4 12PLY STRL (GAUZE/BANDAGES/DRESSINGS) ×2 IMPLANT
GLOVE BIOGEL PI IND STRL 9 (GLOVE) ×1 IMPLANT
GLOVE BIOGEL PI INDICATOR 9 (GLOVE) ×1
GLOVE SURG ORTHO 9.0 STRL STRW (GLOVE) ×2 IMPLANT
GOWN STRL REUS W/ TWL XL LVL3 (GOWN DISPOSABLE) ×2 IMPLANT
GOWN STRL REUS W/TWL XL LVL3 (GOWN DISPOSABLE) ×4
HANDPIECE INTERPULSE COAX TIP (DISPOSABLE)
KIT BASIN OR (CUSTOM PROCEDURE TRAY) ×2 IMPLANT
KIT TURNOVER KIT B (KITS) ×2 IMPLANT
MANIFOLD NEPTUNE II (INSTRUMENTS) ×2 IMPLANT
NS IRRIG 1000ML POUR BTL (IV SOLUTION) ×2 IMPLANT
PACK ORTHO EXTREMITY (CUSTOM PROCEDURE TRAY) ×2 IMPLANT
PAD ABD 8X10 STRL (GAUZE/BANDAGES/DRESSINGS) ×1 IMPLANT
PAD ARMBOARD 7.5X6 YLW CONV (MISCELLANEOUS) ×4 IMPLANT
SET HNDPC FAN SPRY TIP SCT (DISPOSABLE) IMPLANT
STOCKINETTE IMPERVIOUS 9X36 MD (GAUZE/BANDAGES/DRESSINGS) IMPLANT
SUT ETHILON 2 0 PSLX (SUTURE) ×2 IMPLANT
SWAB COLLECTION DEVICE MRSA (MISCELLANEOUS) ×2 IMPLANT
SWAB CULTURE ESWAB REG 1ML (MISCELLANEOUS) IMPLANT
TOWEL GREEN STERILE (TOWEL DISPOSABLE) ×2 IMPLANT
TUBE CONNECTING 12X1/4 (SUCTIONS) ×2 IMPLANT
YANKAUER SUCT BULB TIP NO VENT (SUCTIONS) ×2 IMPLANT

## 2022-07-02 NOTE — Transfer of Care (Signed)
Immediate Anesthesia Transfer of Care Note  Patient: Justin Landry  Procedure(s) Performed: IRRIGATION AND DEBRIDEMENT PREPATELLAR BURSA LEFT KNEE (Left)  Patient Location: PACU  Anesthesia Type:General  Level of Consciousness: awake, alert  and oriented  Airway & Oxygen Therapy: Patient Spontanous Breathing  Post-op Assessment: Report given to RN, Post -op Vital signs reviewed and stable and Patient moving all extremities  Post vital signs: Reviewed and stable  Last Vitals:  Vitals Value Taken Time  BP    Temp    Pulse 74 07/02/22 1502  Resp 10 07/02/22 1502  SpO2 96 % 07/02/22 1502  Vitals shown include unvalidated device data.  Last Pain:  Vitals:   07/02/22 1157  PainSc: 7       Patients Stated Pain Goal: 3 (07/02/22 1157)  Complications: No notable events documented.

## 2022-07-02 NOTE — H&P (Signed)
Justin Landry is an 61 y.o. male.   Chief Complaint: Ulceration cellulitis and abscess left knee prepatellar bursa. HPI: The patient is a 61 year old gentleman who presents today for evaluation of a wound to his left knee after a fall out of bed on July 11.  He fell directly onto his knee and has had pain since he developed redness with streaking to the anterior lateral aspect of his knee went to the emergency department on July 12 at this time it was recommended that he be admitted for IV antibiotics the patient declined he completed a course of Keflex.  Today he reports he has only gotten worse since the 12th he has been taking the Keflex   He is now having anterior pain swelling and redness with some warmth.  There is drainage he denies fever chills  Past Medical History:  Diagnosis Date   ADHD (attention deficit hyperactivity disorder)    Arthritis    right shoulder   Depression    Diabetes mellitus    Type II   Headache    migraines   High cholesterol    Humerus fracture    right   Hypertension    Inguinal hernia    Neuromuscular disorder (HCC)    neuropathy    Past Surgical History:  Procedure Laterality Date   AMPUTATION Left 12/10/2020   Procedure: LEFT GREAT TOE AMPUTATION;  Surgeon: [name], MD;  Location: [facility] OR;  Service: Orthopedics;  Laterality: Left;   AMPUTATION Left 01/21/2021   Procedure: 1ST AND 2ND RAY AMPUTATION LEFT FOOT;  Surgeon: [name], MD;  Location: [facility] OR;  Service: Orthopedics;  Laterality: Left;   AMPUTATION Left 03/18/2021   Procedure: LEFT BELOW KNEE AMPUTATION;  Surgeon: [name], MD;  Location: [facility] OR;  Service: Orthopedics;  Laterality: Left;   AMPUTATION TOE Left 06/23/2019   Procedure: AMPUTATION TOE LEFT AMPUTATION;  Surgeon: Linus Galas, DPM;  Location: [facility] ORS;  Service: Podiatry;  Laterality: Left;   AMPUTATION TOE Left 08/31/2019   Procedure: AMPUTATION TOE  IPJ [id number];  Surgeon: Linus Galas, DPM;  Location: [facility] ORS;   Service: Podiatry;  Laterality: Left;   CYSTOSCOPY  2015   Biopsy   HERNIA REPAIR Right 2008   [place]   REVERSE SHOULDER ARTHROPLASTY Right 09/18/2020   Procedure: RIGHT REVERSE SHOULDER ARTHROPLASTY;  Surgeon: Cammy Copa, MD;  Location: [facility] OR;  Service: Orthopedics;  Laterality: Right;    Family History  Problem Relation Age of Onset   Diabetes Mother    Hypertension Mother    Diabetes Father    Hypertension Father    Cancer Brother        brain   Social History:  reports that he has been smoking cigarettes. He has a 17.20 pack-year smoking history. He has never used smokeless tobacco. He reports that he does not currently use alcohol. He reports that he does not currently use drugs after having used the following drugs: Marijuana. Frequency: 7.00 times per week.  Allergies:  Allergies  Allergen Reactions   Anacin-3 [Acetaminophen] Nausea And Vomiting   Morphine And Related Nausea And Vomiting and Other (See Comments)    Extreme sweating    Oxycontin [Oxycodone Hcl] Other (See Comments)    Extreme NAusea and vomitting follows (Patient Tolerates Percocet short acting)   Penicillins Nausea And Vomiting    Has patient had a PCN reaction causing immediate rash, facial/tongue/throat swelling, SOB or lightheadedness with hypotension: No Has patient had  a PCN reaction causing severe rash involving mucus membranes or skin necrosis: No Has patient had a PCN reaction that required hospitalization No Has patient had a PCN reaction occurring within the last 10 years: No If all of the above answers are "NO", then may proceed with Cephalosporin use.    Tramadol Nausea Only    No medications prior to admission.    No results found for this or any previous visit (from the past 48 hour(s)). No results found.  Review of Systems  All other systems reviewed and are negative.   There were no vitals taken for this visit. Physical Exam  Patient is alert, oriented, no  adenopathy, well-dressed, normal affect, normal respiratory effort. On examination of the left knee there are 5 open areas these are 2 mm in diameter and draining purulent fluid this is minimal to moderate.  There is some surrounding erythema this is not ascending there is warmth and mild edema there is no effusion Assessment/Plan Assessment: Abscess left knee prepatellar bursa.  Plan.  We will plan for excisional debridement of the prepatellar bursa.  Risks and benefits were discussed including persistent infection need for additional surgery.  Patient states he understands wished to proceed at this time.  [name], MD 07/02/2022, 6:52 AM

## 2022-07-02 NOTE — Anesthesia Procedure Notes (Signed)
Procedure Name: LMA Insertion Date/Time: 07/02/2022 2:27 PM  Performed by: Colon Flattery, CRNAPre-anesthesia Checklist: Patient identified, Emergency Drugs available, Suction available and Patient being monitored Patient Re-evaluated:Patient Re-evaluated prior to induction Oxygen Delivery Method: Circle system utilized Preoxygenation: Pre-oxygenation with 100% oxygen Induction Type: IV induction LMA: LMA flexible inserted LMA Size: 4.0 Placement Confirmation: positive ETCO2 and breath sounds checked- equal and bilateral Dental Injury: Teeth and Oropharynx as per pre-operative assessment

## 2022-07-02 NOTE — Anesthesia Preprocedure Evaluation (Signed)
Anesthesia Evaluation  Patient identified by MRN, date of birth, ID band Patient awake    Reviewed: Allergy & Precautions, NPO status , Patient's Chart, lab work & pertinent test results  History of Anesthesia Complications Negative for: history of anesthetic complications  Airway Mallampati: II  TM Distance: >3 FB Neck ROM: Full    Dental  (+) Poor Dentition, Missing, Dental Advisory Given, Loose,    Pulmonary neg pulmonary ROS, Current Smoker,    Pulmonary exam normal        Cardiovascular hypertension, Pt. on medications Normal cardiovascular exam     Neuro/Psych  Headaches, Anxiety Depression    GI/Hepatic negative GI ROS, Neg liver ROS,   Endo/Other  diabetes, Type 2, Oral Hypoglycemic Agents  Renal/GU negative Renal ROS  negative genitourinary   Musculoskeletal negative musculoskeletal ROS (+)   Abdominal   Peds  Hematology negative hematology ROS (+)   Anesthesia Other Findings Infected left knee bursa  Reproductive/Obstetrics                             Anesthesia Physical Anesthesia Plan  ASA: 3  Anesthesia Plan: General   Post-op Pain Management: Tylenol PO (pre-op)* and Toradol IV (intra-op)*   Induction: Intravenous  PONV Risk Score and Plan: 1 and Ondansetron, Dexamethasone, Midazolam and Treatment may vary due to age or medical condition  Airway Management Planned: LMA  Additional Equipment: None  Intra-op Plan:   Post-operative Plan: Extubation in OR  Informed Consent: I have reviewed the patients History and Physical, chart, labs and discussed the procedure including the risks, benefits and alternatives for the proposed anesthesia with the patient or authorized representative who has indicated his/her understanding and acceptance.     Dental advisory given  Plan Discussed with:   Anesthesia Plan Comments:         Anesthesia Quick Evaluation

## 2022-07-02 NOTE — Anesthesia Postprocedure Evaluation (Signed)
Anesthesia Post Note  Patient: Justin Landry  Procedure(s) Performed: IRRIGATION AND DEBRIDEMENT PREPATELLAR BURSA LEFT KNEE (Left)     Patient location during evaluation: PACU Anesthesia Type: General Level of consciousness: awake and alert Pain management: pain level controlled Vital Signs Assessment: post-procedure vital signs reviewed and stable Respiratory status: spontaneous breathing, nonlabored ventilation and respiratory function stable Cardiovascular status: blood pressure returned to baseline and stable Postop Assessment: no apparent nausea or vomiting Anesthetic complications: no   No notable events documented.  Last Vitals:  Vitals:   07/02/22 1530 07/02/22 1545  BP: (!) 227/113 (!) 202/102  Pulse: 74 77  Resp: 15 14  Temp:    SpO2: 97% 97%    Last Pain:  Vitals:   07/02/22 1545  PainSc: 8                  Mehreen Azizi E Lofton Leon

## 2022-07-02 NOTE — Op Note (Signed)
07/02/2022  3:07 PM  PATIENT:  Justin Landry    PRE-OPERATIVE DIAGNOSIS:  Infected Bursa Left Knee  POST-OPERATIVE DIAGNOSIS:  Same  PROCEDURE:  IRRIGATION AND EXCISIONAL DEBRIDEMENT PREPATELLAR BURSA LEFT KNEE  SURGEON:  [name], MD  PHYSICIAN ASSISTANT:None ANESTHESIA:   General  PREOPERATIVE INDICATIONS:  Justin Landry is a  61 y.o. male with a diagnosis of Infected Bursa Left Knee who failed conservative measures and elected for surgical management.    The risks benefits and alternatives were discussed with the patient preoperatively including but not limited to the risks of infection, bleeding, nerve injury, cardiopulmonary complications, the need for revision surgery, among others, and the patient was willing to proceed.  OPERATIVE IMPLANTS: Penrose drains x2  @ENCIMAGES @  OPERATIVE FINDINGS: Patient had abscess and necrotic bursal tissue that was sent for cultures.  OPERATIVE PROCEDURE: Patient was brought the operating room underwent a general anesthetic.  After adequate levels anesthesia were obtained patient's left lower extremity was prepped using DuraPrep draped into a sterile field a timeout was called.  A midline incision was made over the patella.  There is purulence and necrotic prepatellar bursa.  The bursa was excised and the bursa and purulence were sent for cultures.  The wound was irrigated with normal saline a Penrose drain was placed in the medial and lateral gutters.  The incision was closed using 2-0 nylon a sterile dressing was applied patient was extubated taken the PACU in stable condition.   DISCHARGE PLANNING:  Antibiotic duration: Continue antibiotics adjust according to culture sensitivities  Weightbearing: Weightbearing as tolerated  Pain medication: Opioid pathway  Dressing care/ Wound VAC: Compression dressing  Ambulatory devices: Walker  Discharge to: Anticipate discharge to home once cultures are  finalized  Follow-up: In the office 1 week post operative.

## 2022-07-03 ENCOUNTER — Encounter (HOSPITAL_COMMUNITY): Payer: Self-pay | Admitting: Orthopedic Surgery

## 2022-07-03 DIAGNOSIS — F17210 Nicotine dependence, cigarettes, uncomplicated: Secondary | ICD-10-CM | POA: Diagnosis present

## 2022-07-03 DIAGNOSIS — F909 Attention-deficit hyperactivity disorder, unspecified type: Secondary | ICD-10-CM | POA: Diagnosis present

## 2022-07-03 DIAGNOSIS — E1140 Type 2 diabetes mellitus with diabetic neuropathy, unspecified: Secondary | ICD-10-CM | POA: Diagnosis present

## 2022-07-03 DIAGNOSIS — Z833 Family history of diabetes mellitus: Secondary | ICD-10-CM | POA: Diagnosis not present

## 2022-07-03 DIAGNOSIS — Z8249 Family history of ischemic heart disease and other diseases of the circulatory system: Secondary | ICD-10-CM | POA: Diagnosis not present

## 2022-07-03 DIAGNOSIS — Z79899 Other long term (current) drug therapy: Secondary | ICD-10-CM | POA: Diagnosis not present

## 2022-07-03 DIAGNOSIS — M7042 Prepatellar bursitis, left knee: Secondary | ICD-10-CM | POA: Diagnosis present

## 2022-07-03 DIAGNOSIS — L03116 Cellulitis of left lower limb: Secondary | ICD-10-CM | POA: Diagnosis present

## 2022-07-03 DIAGNOSIS — Z885 Allergy status to narcotic agent status: Secondary | ICD-10-CM | POA: Diagnosis not present

## 2022-07-03 DIAGNOSIS — Z6832 Body mass index (BMI) 32.0-32.9, adult: Secondary | ICD-10-CM | POA: Diagnosis not present

## 2022-07-03 DIAGNOSIS — Z886 Allergy status to analgesic agent status: Secondary | ICD-10-CM | POA: Diagnosis not present

## 2022-07-03 DIAGNOSIS — E669 Obesity, unspecified: Secondary | ICD-10-CM | POA: Diagnosis present

## 2022-07-03 DIAGNOSIS — Z880 Allergy status to penicillin: Secondary | ICD-10-CM | POA: Diagnosis not present

## 2022-07-03 DIAGNOSIS — L02416 Cutaneous abscess of left lower limb: Secondary | ICD-10-CM | POA: Diagnosis present

## 2022-07-03 DIAGNOSIS — Z7984 Long term (current) use of oral hypoglycemic drugs: Secondary | ICD-10-CM | POA: Diagnosis not present

## 2022-07-03 DIAGNOSIS — Z888 Allergy status to other drugs, medicaments and biological substances status: Secondary | ICD-10-CM | POA: Diagnosis not present

## 2022-07-03 DIAGNOSIS — I10 Essential (primary) hypertension: Secondary | ICD-10-CM | POA: Diagnosis present

## 2022-07-03 DIAGNOSIS — W06XXXA Fall from bed, initial encounter: Secondary | ICD-10-CM | POA: Diagnosis present

## 2022-07-03 DIAGNOSIS — E7800 Pure hypercholesterolemia, unspecified: Secondary | ICD-10-CM | POA: Diagnosis present

## 2022-07-03 DIAGNOSIS — M71162 Other infective bursitis, left knee: Secondary | ICD-10-CM | POA: Diagnosis present

## 2022-07-03 DIAGNOSIS — Z7951 Long term (current) use of inhaled steroids: Secondary | ICD-10-CM | POA: Diagnosis not present

## 2022-07-03 DIAGNOSIS — Z96611 Presence of right artificial shoulder joint: Secondary | ICD-10-CM | POA: Diagnosis present

## 2022-07-03 DIAGNOSIS — Z89512 Acquired absence of left leg below knee: Secondary | ICD-10-CM | POA: Diagnosis not present

## 2022-07-03 DIAGNOSIS — B9561 Methicillin susceptible Staphylococcus aureus infection as the cause of diseases classified elsewhere: Secondary | ICD-10-CM | POA: Diagnosis present

## 2022-07-03 DIAGNOSIS — F32A Depression, unspecified: Secondary | ICD-10-CM | POA: Diagnosis present

## 2022-07-03 LAB — HEMOGLOBIN A1C
Hgb A1c MFr Bld: 6.7 % — ABNORMAL HIGH (ref 4.8–5.6)
Mean Plasma Glucose: 145.59 mg/dL

## 2022-07-03 LAB — GLUCOSE, CAPILLARY
Glucose-Capillary: 180 mg/dL — ABNORMAL HIGH (ref 70–99)
Glucose-Capillary: 150 mg/dL — ABNORMAL HIGH (ref 70–99)
Glucose-Capillary: 193 mg/dL — ABNORMAL HIGH (ref 70–99)
Glucose-Capillary: 136 mg/dL — ABNORMAL HIGH (ref 70–99)

## 2022-07-03 LAB — WOUND CULTURE
MICRO NUMBER:: 13667421
SPECIMEN QUALITY:: ADEQUATE

## 2022-07-03 MED ORDER — NICOTINE 21 MG/24HR TD PT24
21.0000 mg | MEDICATED_PATCH | Freq: Every day | TRANSDERMAL | Status: DC
Start: 2022-07-03 — End: 2022-07-05
  Administered 2022-07-03 – 2022-07-04 (×2): 21 mg via TRANSDERMAL
  Filled 2022-07-03 (×3): qty 1

## 2022-07-03 NOTE — Plan of Care (Signed)
  Problem: Clinical Measurements: Goal: Ability to maintain clinical measurements within normal limits will improve Outcome: Progressing   Problem: Activity: Goal: Risk for activity intolerance will decrease Outcome: Progressing   Problem: Coping: Goal: Level of anxiety will decrease Outcome: Progressing   Problem: Pain Managment: Goal: General experience of comfort will improve Outcome: Progressing   Problem: Safety: Goal: Ability to remain free from injury will improve Outcome: Progressing   Problem: Skin Integrity: Goal: Risk for impaired skin integrity will decrease Outcome: Progressing   

## 2022-07-03 NOTE — Progress Notes (Signed)
Patient ID: Justin Landry, male   DOB: December 01, 1961, 61 y.o.   MRN: [id number] Patient is postoperative day 1 excisional debridement of a abscess prepatellar bursa of the left knee.  Cultures are pending.  Patient is IV.  Will adjust antibiotics according to final tissue cultures.  We will continue inpatient admission and IV antibiotics until cultures are finalized.

## 2022-07-03 NOTE — Plan of Care (Signed)
  Problem: Coping: Goal: Level of anxiety will decrease Outcome: Progressing   Problem: Pain Managment: Goal: General experience of comfort will improve Outcome: Progressing   Problem: Safety: Goal: Ability to remain free from injury will improve Outcome: Progressing   Problem: Skin Integrity: Goal: Risk for impaired skin integrity will decrease Outcome: Progressing   

## 2022-07-03 NOTE — Plan of Care (Signed)
  Problem: Education: Goal: Ability to describe self-care measures that may prevent or decrease complications (Diabetes Survival Skills Education) will improve Outcome: Progressing Goal: Individualized Educational Video(s) Outcome: Progressing   Problem: Coping: Goal: Ability to adjust to condition or change in health will improve Outcome: Progressing   Problem: Fluid Volume: Goal: Ability to maintain a balanced intake and output will improve Outcome: Progressing   Problem: Health Behavior/Discharge Planning: Goal: Ability to identify and utilize available resources and services will improve Outcome: Progressing Goal: Ability to manage health-related needs will improve Outcome: Progressing   Problem: Metabolic: Goal: Ability to maintain appropriate glucose levels will improve Outcome: Progressing   Problem: Nutritional: Goal: Maintenance of adequate nutrition will improve Outcome: Progressing Goal: Progress toward achieving an optimal weight will improve Outcome: Progressing   Problem: Skin Integrity: Goal: Risk for impaired skin integrity will decrease Outcome: Progressing   Problem: Tissue Perfusion: Goal: Adequacy of tissue perfusion will improve Outcome: Progressing   Problem: Education: Goal: Knowledge of General Education information will improve Description: Including pain rating scale, medication(s)/side effects and non-pharmacologic comfort measures Outcome: Progressing   Problem: Clinical Measurements: Goal: Ability to maintain clinical measurements within normal limits will improve Outcome: Progressing Goal: Will remain free from infection Outcome: Progressing Goal: Diagnostic test results will improve Outcome: Progressing Goal: Respiratory complications will improve Outcome: Progressing Goal: Cardiovascular complication will be avoided Outcome: Progressing

## 2022-07-03 NOTE — Evaluation (Addendum)
Physical Therapy Evaluation Patient Details Name: Justin Landry MRN: [id number] DOB: 09-Dec-1961 Today's Date: 07/03/2022  History of Present Illness  Patient is 61 y.o. 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   Clinical Impression  Justin Landry is 61 y.o. male admitted with above HPI and diagnosis. Patient is currently limited by functional impairments below (see PT problem list). Patient struggles with homelessness but is planning to live with his aunt during recovery; he is mod independent with prosthesis for Lt LE at baseline. Patient evaluated by Physical Therapy with no further acute PT needs identified. All education has been completed and the patient has no further questions. He is Mod independent for bed<>chair transfers and independent with wheelchair mobility. Pt is likely unable to wear prosthesis for stair mobility to enter his home, time spent planning stair mobility if he is unable to use prosthesis. Attempted to clarify with Dr. Lajoyce Corners and awaiting reply. RN notified of question as well. Pt has no further acute PT needs, please re-consult if there is a need for gait/stair training based on post op restrictions. See below for any follow-up Physical Therapy or equipment needs. PT is signing off. Thank you for this referral.       Recommendations for follow up therapy are one component of a multi-disciplinary discharge planning process, led by the attending physician.  Recommendations may be updated based on patient status, additional functional criteria and insurance authorization.  Follow Up Recommendations No PT follow up      Assistance Recommended at Discharge Intermittent Supervision/Assistance  Patient can return home with the following  Help with stairs or ramp for entrance;Assist for transportation    Equipment Recommendations  None recommended by PT  Recommendations for Other Services       Functional Status Assessment Patient has had a recent decline in their functional status and demonstrates the ability to make significant improvements in function in a reasonable and predictable amount of time.     Precautions / Restrictions Precautions Precautions: Fall Restrictions Weight Bearing Restrictions: No      Mobility  Bed Mobility Overal bed mobility: Independent                  Transfers Overall transfer level: Modified independent Equipment used: None Transfers: Bed to chair/wheelchair/BSC       Squat pivot transfers: Supervision, Modified independent (Device/Increase time)     General transfer comment: supervision on first transfer bed>wc and cues for safe set up position. Mod I for next 2 squat pivot transfers, no LOB by pt and he verbalized safe setup.    Ambulation/Gait                  Administrator mobility: Yes Wheelchair propulsion: Both upper extremities, Right lower extremity Wheelchair parts: Independent Distance: 600 Wheelchair Assistance Details (indicate cue type and reason): pt demonstrated safe management of wc brakes and locked prior to all transfers to/from chair. pt able to self propel with bil UE and Rt LE, demonstrated good safety awareness with navigating busy hospital hallways.  Modified Rankin (Stroke Patients Only)       Balance Overall balance assessment: Mild deficits observed, not formally tested Sitting-balance support: Feet supported, No upper extremity supported Sitting balance-Leahy Scale: Good  Standing balance comment: NT, no prosthetic                             Pertinent Vitals/Pain Pain Assessment Pain Assessment: Faces Faces Pain Scale: Hurts a little bit Pain Location: Lt knee Pain Descriptors / Indicators: Discomfort, Aching Pain  Intervention(s): Limited activity within patient's tolerance, Monitored during session, Patient requesting pain meds-RN notified    Home Living Family/patient expects to be discharged to:: Shelter/Homeless Living Arrangements: Non-relatives/Friends                 Additional Comments: prosthetic is in his truck in the parking deck her at the hospital. Pt is primarily homeless and waiting on housing authority to provide apartment/housing (he has been approved but it is a ~18 month wait). he plans to stay with his aunt for the next year.    Prior Function Prior Level of Function : Independent/Modified Independent             Mobility Comments: pt typically independent with prosthesis, has wc if needed ADLs Comments: independent     Hand Dominance   Dominant Hand: Right    Extremity/Trunk Assessment   Upper Extremity Assessment Upper Extremity Assessment: Overall WFL for tasks assessed    Lower Extremity Assessment Lower Extremity Assessment: LLE deficits/detail;RLE deficits/detail RLE Deficits / Details: WFL's for strength and ROM LLE Deficits / Details: shrinker in place, good ROM at Lt knee    Cervical / Trunk Assessment Cervical / Trunk Assessment: Normal  Communication   Communication: No difficulties  Cognition Arousal/Alertness: Awake/alert Behavior During Therapy: WFL for tasks assessed/performed Overall Cognitive Status: Within Functional Limits for tasks assessed                                          General Comments      Exercises     Assessment/Plan    PT Assessment    PT Problem List         PT Treatment Interventions      PT Goals (Current goals can be found in the Care Plan section)  Acute Rehab PT Goals Patient Stated Goal: stay with aunt while he recovers and waits on housing. PT Goal Formulation: With patient Time For Goal Achievement: 07/17/22 Potential to Achieve Goals: Good    Frequency        Co-evaluation               AM-PAC PT "6 Clicks" Mobility  Outcome Measure Help needed turning from your back to your side while in a flat bed without using bedrails?: None Help needed moving from lying on your back to sitting on the side of a flat bed without using bedrails?: None Help needed moving to and from a bed to a chair (including a wheelchair)?: None Help needed standing up from a chair using your arms (e.g., wheelchair or bedside chair)?: A Little Help needed to walk in hospital room?: A Little Help needed climbing 3-5 steps with a railing? : A Lot 6 Click Score: 20    End of Session   Activity Tolerance: Patient tolerated treatment well Patient left: in chair;with call bell/phone within reach Furniture conservator/restorer) Nurse Communication: Mobility status      Time: 0940-1009 PT Time Calculation (min) (ACUTE ONLY): 29 min   Charges:   PT Evaluation $PT Eval Moderate  Complexity: 1 Mod        Wynn Maudlin, DPT Acute Rehabilitation Services Office [phone] Pager [phone]  07/03/22 12:14 PM

## 2022-07-04 LAB — GLUCOSE, CAPILLARY
Glucose-Capillary: 124 mg/dL — ABNORMAL HIGH (ref 70–99)
Glucose-Capillary: 141 mg/dL — ABNORMAL HIGH (ref 70–99)
Glucose-Capillary: 99 mg/dL (ref 70–99)
Glucose-Capillary: 142 mg/dL — ABNORMAL HIGH (ref 70–99)

## 2022-07-04 LAB — CULTURE, BLOOD (ROUTINE X 2)
Culture: NO GROWTH
Special Requests: ADEQUATE

## 2022-07-04 MED ORDER — VANCOMYCIN HCL IN DEXTROSE 1-5 GM/200ML-% IV SOLN
1000.0000 mg | Freq: Two times a day (BID) | INTRAVENOUS | Status: DC
Start: 2022-07-04 — End: 2022-07-05
  Administered 2022-07-04 – 2022-07-05 (×3): 1000 mg via INTRAVENOUS
  Filled 2022-07-04 (×4): qty 200

## 2022-07-04 MED ORDER — LOPERAMIDE HCL 2 MG PO CAPS
2.0000 mg | ORAL_CAPSULE | ORAL | Status: DC | PRN
Start: 2022-07-04 — End: 2022-07-05
  Administered 2022-07-04: 2 mg via ORAL
  Filled 2022-07-04: qty 1

## 2022-07-04 MED ORDER — RISAQUAD PO CAPS
1.0000 | ORAL_CAPSULE | Freq: Every day | ORAL | Status: DC
  Administered 2022-07-05: 1 via ORAL
  Filled 2022-07-04: qty 1

## 2022-07-04 NOTE — Progress Notes (Signed)
Pharmacy Antibiotic Note  Justin Landry is a 61 y.o. male admitted on 07/02/2022 with infected bursa of left knee  Pharmacy has been consulted for vancomycin dosing. Surgical cultures growing staph aureus. Broadening coverage for MRSA  Plan: Vancomycin 1000mg  IV q12h eAUC 545, using Scr of 0.85 F/u C&S for deescalation   Height: 5\' 7"  (170.2 cm) Weight: 95.3 kg (210 lb 1.6 oz) IBW/kg (Calculated) : 66.1  Temp (24hrs), Avg:98 F (36.7 C), Min:97.9 F (36.6 C), Max:98.2 F (36.8 C)  Recent Labs  Lab 06/29/22 1305  WBC 16.5*  CREATININE 0.85  LATICACIDVEN 2.8*    Estimated Creatinine Clearance: 103 mL/min (by C-G formula based on SCr of 0.85 mg/dL).    Allergies  Allergen Reactions   Anacin-3 [Acetaminophen] Nausea And Vomiting   Morphine And Related Nausea And Vomiting and Other (See Comments)    Extreme sweating    Oxycontin [Oxycodone Hcl] Other (See Comments)    Extreme NAusea and vomitting follows (Patient Tolerates Percocet short acting)   Penicillins Nausea And Vomiting    Has patient had a PCN reaction causing immediate rash, facial/tongue/throat swelling, SOB or lightheadedness with hypotension: No Has patient had a PCN reaction causing severe rash involving mucus membranes or skin necrosis: No Has patient had a PCN reaction that required hospitalization No Has patient had a PCN reaction occurring within the last 10 years: No If all of the above answers are "NO", then may proceed with Cephalosporin use.    Tramadol Nausea Only    Antimicrobials this admission: 7/21 cefazolin >>  7/23 Vancomycin >>   Dose adjustments this admission: N/a  Microbiology results: 7/21 Abscess : Staph aureus, Sensitivities pending   Justin Landry A. 8/23, PharmD, BCPS, FNKF Clinical Pharmacist [place] Please utilize Amion for appropriate phone number to reach the unit pharmacist [facility])  07/04/2022 10:04 AM

## 2022-07-04 NOTE — Progress Notes (Signed)
Patient is POD 2 s/p excisional debridement of septic prepatellar bursitis of the left knee.  Reports he is doing well today.  No complaint of fevers or chills.  Pain is much improved compared with prior to surgery.  Cultures from office visit on 06/30/2022 are growing MRSA with susceptibility to vancomycin, clindamycin, gentamicin, tetracycline.  Cultures from surgery on 7/21 are growing Staph aureus; susceptibilities are still pending.  Switching from Ancef to vancomycin today.  Vancomycin dosing per pharmacy, appreciate their assistance.  Plan to continue inpatient stay until final susceptibility from intraoperative cultures are back.

## 2022-07-04 NOTE — Progress Notes (Signed)
Patient stated that he has been having several episodes of Justin Landry loose stools about 4-5 he stated no bowel pain of abdominal pain and this occurred after he ate a fruit cup. MD on call was informed and stated he will place order. Will continue to monitor. Ilean Skill LPN

## 2022-07-05 ENCOUNTER — Telehealth: Payer: Medicaid Other | Admitting: Orthopedic Surgery

## 2022-07-05 LAB — GLUCOSE, CAPILLARY: Glucose-Capillary: 184 mg/dL — ABNORMAL HIGH (ref 70–99)

## 2022-07-05 MED ORDER — DOXYCYCLINE HYCLATE 100 MG PO TABS: 100.0000 mg | ORAL_TABLET | Freq: Two times a day (BID) | ORAL | 0 refills | Status: DC

## 2022-07-05 MED ORDER — OXYCODONE-ACETAMINOPHEN 5-325 MG PO TABS: 1.0000 | ORAL_TABLET | ORAL | 0 refills | Status: DC | PRN

## 2022-07-05 NOTE — Plan of Care (Signed)
  Problem: Education: Goal: Ability to describe self-care measures that may prevent or decrease complications (Diabetes Survival Skills Education) will improve Outcome: Adequate for Discharge Goal: Individualized Educational Video(s) Outcome: Adequate for Discharge   Problem: Coping: Goal: Ability to adjust to condition or change in health will improve Outcome: Adequate for Discharge   Problem: Fluid Volume: Goal: Ability to maintain a balanced intake and output will improve Outcome: Adequate for Discharge   Problem: Fluid Volume: Goal: Ability to maintain a balanced intake and output will improve Outcome: Adequate for Discharge   Problem: Health Behavior/Discharge Planning: Goal: Ability to identify and utilize available resources and services will improve Outcome: Adequate for Discharge Goal: Ability to manage health-related needs will improve Outcome: Adequate for Discharge   Problem: Metabolic: Goal: Ability to maintain appropriate glucose levels will improve Outcome: Adequate for Discharge   Problem: Nutritional: Goal: Maintenance of adequate nutrition will improve Outcome: Adequate for Discharge Goal: Progress toward achieving an optimal weight will improve Outcome: Adequate for Discharge   Problem: Skin Integrity: Goal: Risk for impaired skin integrity will decrease Outcome: Adequate for Discharge   Problem: Tissue Perfusion: Goal: Adequacy of tissue perfusion will improve Outcome: Adequate for Discharge   Problem: Education: Goal: Knowledge of General Education information will improve Description: Including pain rating scale, medication(s)/side effects and non-pharmacologic comfort measures Outcome: Adequate for Discharge   Problem: Health Behavior/Discharge Planning: Goal: Ability to manage health-related needs will improve Outcome: Adequate for Discharge   Problem: Clinical Measurements: Goal: Ability to maintain clinical measurements within normal  limits will improve Outcome: Adequate for Discharge Goal: Will remain free from infection Outcome: Adequate for Discharge Goal: Diagnostic test results will improve Outcome: Adequate for Discharge Goal: Respiratory complications will improve Outcome: Adequate for Discharge Goal: Cardiovascular complication will be avoided Outcome: Adequate for Discharge   Problem: Activity: Goal: Risk for activity intolerance will decrease Outcome: Adequate for Discharge   Problem: Nutrition: Goal: Adequate nutrition will be maintained Outcome: Adequate for Discharge   Problem: Coping: Goal: Level of anxiety will decrease Outcome: Adequate for Discharge   Problem: Elimination: Goal: Will not experience complications related to bowel motility Outcome: Adequate for Discharge Goal: Will not experience complications related to urinary retention Outcome: Adequate for Discharge   Problem: Pain Managment: Goal: General experience of comfort will improve Outcome: Adequate for Discharge   Problem: Safety: Goal: Ability to remain free from injury will improve Outcome: Adequate for Discharge   Problem: Skin Integrity: Goal: Risk for impaired skin integrity will decrease Outcome: Adequate for Discharge

## 2022-07-05 NOTE — Plan of Care (Signed)

## 2022-07-05 NOTE — Telephone Encounter (Signed)
Pt called requesting a call back concerning his pain medication. Pt states he is having problems picking up his meds from pharmacy and just has surgery Friday. Please call pt at [phone]

## 2022-07-05 NOTE — Telephone Encounter (Signed)
Dr. Lajoyce Corners sent in oxycodone (percocet) VS the immediate release today 07/05/22

## 2022-07-05 NOTE — Telephone Encounter (Signed)
Called pt back, rang, no answer and VM box is full.

## 2022-07-05 NOTE — Progress Notes (Signed)
Patient ID: Justin Landry, male   DOB: 1961/01/14, 61 y.o.   MRN: [id number] Patient without complaints this morning.  Cultures are showing staph with sensitivities pending.  We will plan for discharge to home at this time on doxycycline.  Will adjust this pending culture sensitivities.  Follow-up in the office in 1 week to remove the dressing and the [place] drains.

## 2022-07-05 NOTE — TOC Transition Note (Signed)
Transition of Care [facility]) - CM/SW Discharge Note   Patient Details  Name: Justin Landry MRN: [id number] Date of Birth: October 30, 1961  [facility] of [facility]) CM/SW Contact:  Bess Kinds, RN Phone Number: [phone] 07/05/2022, 8:26 AM   Clinical Narrative:     Patient to transition home today. No TOC needs identified at this time.   Final next level of care: Home/Self Care Barriers to Discharge: No Barriers Identified   Patient Goals and CMS Choice        Discharge Placement                       Discharge Plan and Services                                     Social Determinants of Health (SDOH) Interventions     Readmission Risk Interventions     No data to display

## 2022-07-05 NOTE — Discharge Summary (Signed)
Discharge Diagnoses:  Principal Problem:   Prepatellar bursitis, left knee Active Problems:   Septic infrapatellar bursitis of left knee   Surgeries: Procedure(s): IRRIGATION AND DEBRIDEMENT PREPATELLAR BURSA LEFT KNEE on 07/02/2022    Consultants:   Discharged Condition: Improved  Hospital Course: Justin Landry is an 61 y.o. male who was admitted 07/02/2022 with a chief complaint of infected prepatellar bursa left knee, with a final diagnosis of Infected Bursa Left Knee.  Patient was brought to the operating room on 07/02/2022 and underwent Procedure(s): IRRIGATION AND DEBRIDEMENT PREPATELLAR BURSA LEFT KNEE.    Patient was given perioperative antibiotics:  Anti-infectives (From admission, onward)    Start     Dose/Rate Route Frequency Ordered Stop   07/05/22 0000  doxycycline (VIBRA-TABS) 100 MG tablet        100 mg Oral 2 times daily 07/05/22 0801     07/04/22 1045  vancomycin (VANCOCIN) IVPB 1000 mg/200 mL premix        1,000 mg 200 mL/hr over 60 Minutes Intravenous Every 12 hours 07/04/22 0957     07/02/22 2200  ceFAZolin (ANCEF) IVPB 2g/100 mL premix  Status:  Discontinued        2 g 200 mL/hr over 30 Minutes Intravenous Every 8 hours 07/02/22 2013 07/04/22 1025   07/02/22 1145  ceFAZolin (ANCEF) IVPB 2g/100 mL premix        2 g 200 mL/hr over 30 Minutes Intravenous On call to O.R. 07/02/22 1134 07/02/22 1447   07/02/22 1143  ceFAZolin (ANCEF) 2-4 GM/100ML-% IVPB       Note to Pharmacy: Kandice Hams D: cabinet override      07/02/22 1143 07/02/22 1433     .  Patient was given sequential compression devices, early ambulation, and aspirin for DVT prophylaxis.  Recent vital signs: Patient Vitals for the past 24 hrs:  BP Temp Temp src Pulse Resp SpO2  07/05/22 0529 (!) 170/96 98 F (36.7 C) Oral 77 -- 97 %  07/04/22 2100 (!) 179/94 98 F (36.7 C) Oral -- 18 97 %  07/04/22 1504 (!) 169/91 98 F (36.7 C) -- 71 18 97 %  .  Recent laboratory studies: No  results found.  Discharge Medications:   Allergies as of 07/05/2022       Reactions   Anacin-3 [acetaminophen] Nausea And Vomiting   Morphine And Related Nausea And Vomiting, Other (See Comments)   Extreme sweating    Oxycontin [oxycodone Hcl] Other (See Comments)   Extreme NAusea and vomitting follows (Patient Tolerates Percocet short acting)   Penicillins Nausea And Vomiting   Has patient had a PCN reaction causing immediate rash, facial/tongue/throat swelling, SOB or lightheadedness with hypotension: No Has patient had a PCN reaction causing severe rash involving mucus membranes or skin necrosis: No Has patient had a PCN reaction that required hospitalization No Has patient had a PCN reaction occurring within the last 10 years: No If all of the above answers are "NO", then may proceed with Cephalosporin use.   Tramadol Nausea Only        Medication List     TAKE these medications    acetaminophen 500 MG tablet Commonly known as: TYLENOL Take 500 mg by mouth every 6 (six) hours as needed (for pain.).   budesonide-formoterol 160-4.5 MCG/ACT inhaler Commonly known as: SYMBICORT Inhale 2 puffs into the lungs 2 (two) times daily as needed (respiratory issues.).   cholecalciferol 25 MCG (1000 UNIT) tablet Commonly known as: VITAMIN D Take 1,000 Units  by mouth in the morning.   doxycycline 100 MG tablet Commonly known as: VIBRA-TABS Take 1 tablet (100 mg total) by mouth 2 (two) times daily.   gabapentin 400 MG capsule Commonly known as: NEURONTIN Take 1 capsule (400 mg total) by mouth 3 (three) times daily.   hydrOXYzine 50 MG capsule Commonly known as: VISTARIL Take 50 mg by mouth 3 (three) times daily.   ibuprofen 200 MG tablet Commonly known as: ADVIL Take 600 mg by mouth every 6 (six) hours as needed (pain.).   losartan 100 MG tablet Commonly known as: COZAAR Take 100 mg by mouth in the morning.   metFORMIN 1000 MG tablet Commonly known as: GLUCOPHAGE TAKE  ONE TABLET BY MOUTH 2 TIMES A DAY WITH A MEAL   oxyCODONE-acetaminophen 5-325 MG tablet Commonly known as: PERCOCET/ROXICET Take 1 tablet by mouth every 4 (four) hours as needed. What changed: reasons to take this   varenicline 0.5 MG tablet Commonly known as: CHANTIX Take 0.5 mg by mouth daily as needed for smoking cessation.   vitamin C 500 MG tablet Commonly known as: ASCORBIC ACID Take 500 mg by mouth daily.        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 No visible joint effusion. No evidence of regional fracture. Chronic periosteal calcification of the medial distal femur, not of acute clinical significance. Arterial calcification incidentally noted. IMPRESSION: No acute traumatic finding.  Below the knee amputation. Electronically Signed   By: Paulina Fusi M.D.   On: 06/29/2022 13:14   DG Knee Complete 4 Views Left  Result Date: 06/23/2022 CLINICAL DATA:  Provided history: Fall. Additional history provided: Left knee pain, fall. EXAM: LEFT KNEE - COMPLETE 4+ VIEW COMPARISON:  Prior knee radiograph 06/08/2021 and earlier. FINDINGS: There is normal bony alignment. No evidence of acute fracture. The joint spaces are maintained. Redemonstrated bony protrusion arising from the medial aspect of the distal right femur. This is chronic and unchanged, and likely reflects or sequela of remote trauma. Mild enthesophyte formation at the superior aspect of the patella. Soft tissue swelling about the knee. Vascular calcifications. IMPRESSION: No evidence of acute fracture. Soft tissue swelling about the knee. Electronically Signed   By: Jackey Loge D.O.   On: 06/23/2022 13:40    Patient benefited maximally from their hospital stay and there were no complications.     Disposition:  Discharge disposition: 01-Home or Self Care      Discharge Instructions     Call MD / Call 911   Complete by: As directed    If you experience chest pain or shortness of breath, CALL 911 and be transported to the hospital emergency room.  If you develope a fever above 101 F, pus (white drainage) or increased drainage or redness at the wound, or calf pain, call your surgeon's office.   Constipation Prevention   Complete by: As directed    Drink plenty of fluids.  Prune juice may be helpful.  You may use a stool softener, such as Colace (over the counter) 100 mg twice a day.  Use MiraLax (over the counter) for constipation as needed.   Diet - low sodium heart healthy   Complete by: As directed    Increase activity slowly as tolerated   Complete by: As directed  Post-operative opioid taper instructions:   Complete by: As directed    POST-OPERATIVE OPIOID TAPER INSTRUCTIONS: It is important to wean off of your opioid medication as soon as possible. If you do not need pain medication after your surgery it is ok to stop day one. Opioids include: Codeine, Hydrocodone(Norco, Vicodin), Oxycodone(Percocet, oxycontin) and hydromorphone amongst others.  Long term and even short term use of opiods can cause: Increased pain response Dependence Constipation Depression Respiratory depression And more.  Withdrawal symptoms can include Flu like symptoms Nausea, vomiting And more Techniques to manage these symptoms Hydrate well Eat regular healthy meals Stay active Use relaxation techniques(deep breathing, meditating, yoga) Do Not substitute Alcohol to help with tapering If you have been on opioids for less than two weeks and do not have pain than it is ok to stop all together.  Plan to wean off of opioids This plan should start within one week post op of your joint replacement. Maintain the same interval or time between taking each dose and first decrease the dose.  Cut the total daily  intake of opioids by one tablet each day Next start to increase the time between doses. The last dose that should be eliminated is the evening dose.          Follow-up Information     [name], MD Follow up in 1 week(s).   Specialty: Orthopedic Surgery Contact information: [address] [place] [address] [phone]                  Signed: [name] 07/05/2022, 8:14 AM

## 2022-07-06 ENCOUNTER — Telehealth: Payer: Self-pay | Admitting: Orthopedic Surgery

## 2022-07-06 NOTE — Telephone Encounter (Signed)
Returned call to [place]. Pt states he will try to stay in an area with reception on his cell phone. Pt phone number is [phone].

## 2022-07-06 NOTE — Telephone Encounter (Signed)
Noted, see other message in regards of this.

## 2022-07-06 NOTE — Telephone Encounter (Signed)
SW pt, he said that [facility] won't feel pain med because it is too soon. Cannot fill until sometime in August per pt. Will look at the [facility] database and see when his last refill was of pain medications. Told him I would call back.

## 2022-07-06 NOTE — Telephone Encounter (Signed)
Checked database, he got #180 oxycodone 06/18/22 which is a 30 d/s by Dr. Sydnee Levans. We cannot fill this nor can [facility] due to state law restrictions.

## 2022-07-06 NOTE — Telephone Encounter (Signed)
Pt informed. Stated he could try 2 aleves BID w/ the oxycodone. He did state that he did have some left and I let him know this current Rx should last 07/16/22.

## 2022-07-07 LAB — AEROBIC/ANAEROBIC CULTURE W GRAM STAIN (SURGICAL/DEEP WOUND)

## 2022-07-14 ENCOUNTER — Encounter: Payer: Self-pay | Admitting: Family

## 2022-07-14 ENCOUNTER — Ambulatory Visit (INDEPENDENT_AMBULATORY_CARE_PROVIDER_SITE_OTHER): Payer: Medicaid Other | Admitting: Family

## 2022-07-14 DIAGNOSIS — M71162 Other infective bursitis, left knee: Secondary | ICD-10-CM

## 2022-07-14 DIAGNOSIS — B9689 Other specified bacterial agents as the cause of diseases classified elsewhere: Secondary | ICD-10-CM

## 2022-07-14 NOTE — Progress Notes (Signed)
Post-Op Visit Note   Patient: Justin Landry           Date of Birth: [birth year]           MRN: [id number] Visit Date: 07/14/2022 PCP: Care, [facility]  Chief Complaint:  Chief Complaint  Patient presents with   Left Knee - Routine Post Op    07/02/22 ID prepatellar bursa    HPI:  HPI The patient is a 61 year old gentleman who is seen status post irrigation debridement of his prepatellar bursa 5 days ago.  He is seen today for concern of burning pain in his left knee  Ortho Exam On examination of the left knee the Penrose drains were removed today without incident.  His incision is well-healed proximally.  The distal aspect he has about an inch in length that is open and filled in with granulation there is scant bloody drainage there is no erythema or warmth odor  Visit Diagnoses: No diagnosis found.  Plan: Begin daily Dial soap cleansing.  Dry dressings.  Given some wound care supplies today.  Follow-up in 1 to 2 weeks for suture removal.  Discussed returning sooner for any worsening  Follow-Up Instructions: No follow-ups on file.   Imaging: No results found.  Orders:  No orders of the defined types were placed in this encounter.  No orders of the defined types were placed in this encounter.    PMFS History: Patient Active Problem List   Diagnosis Date Noted   Prepatellar bursitis, left knee 07/02/2022   Septic infrapatellar bursitis of left knee    Type 2 diabetes mellitus with other specified complication (HCC) 04/21/2021   Acute osteomyelitis of left foot (HCC) 03/18/2021   Cutaneous abscess of left foot    Dehiscence of amputation stump (HCC)    Subacute osteomyelitis of left foot (HCC)    Rupture of operation wound    Subacute osteomyelitis, left ankle and foot (HCC)    Overgrown toenails 10/30/2020   Pre-operative clearance 09/16/2020   Abnormal EKG 09/16/2020   Chronic left shoulder pain 04/10/2020   Toe pain 02/21/2020   Anxiety  02/08/2020   Left knee pain 02/07/2020   Inguinodynia, right 11/13/2019   Elevated lipids 10/04/2019   Diabetic ulcer of toe of left foot associated with diabetes mellitus due to underlying condition (HCC) 08/23/2019   History of right inguinal hernia 08/14/2019   Encounter to establish care 08/14/2019   Anxiety 08/14/2019   Right shoulder pain 08/14/2019   Cellulitis 06/22/2019   Essential hypertension 05/27/2015   Type 2 diabetes mellitus without complication (HCC) 05/27/2015   Diverticulosis of large intestine without hemorrhage 10/16/2014   Recurrent right inguinal hernia 09/27/2014   Tobacco use 08/01/2014   Hematuria, gross 07/03/2014   Lower urinary tract symptoms (LUTS) 07/03/2014   Diabetes mellitus (HCC) 06/25/2014   Inguinal hernia, bilateral  04/10/2014   Past Medical History:  Diagnosis Date   ADHD (attention deficit hyperactivity disorder)    Arthritis    right shoulder   Depression    Diabetes mellitus    Type II   Headache    migraines   High cholesterol    Humerus fracture    right   Hypertension    Inguinal hernia    Neuromuscular disorder (HCC)    neuropathy    Family History  Problem Relation Age of Onset   Diabetes Mother    Hypertension Mother    Diabetes Father    Hypertension Father  Cancer Brother        brain    Past Surgical History:  Procedure Laterality Date   AMPUTATION Left 12/10/2020   Procedure: LEFT GREAT TOE AMPUTATION;  Surgeon: [name], MD;  Location: [facility];  Service: Orthopedics;  Laterality: Left;   AMPUTATION Left 01/21/2021   Procedure: 1ST AND 2ND RAY AMPUTATION LEFT FOOT;  Surgeon: [name], MD;  Location: [facility] OR;  Service: Orthopedics;  Laterality: Left;   AMPUTATION Left 03/18/2021   Procedure: LEFT BELOW KNEE AMPUTATION;  Surgeon: [name], MD;  Location: [facility] OR;  Service: Orthopedics;  Laterality: Left;   AMPUTATION TOE Left 06/23/2019   Procedure: AMPUTATION TOE LEFT AMPUTATION;  Surgeon: Linus Galas, DPM;  Location: [facility] ORS;  Service: Podiatry;  Laterality: Left;   AMPUTATION TOE Left 08/31/2019   Procedure: AMPUTATION TOE  IPJ [id number];  Surgeon: Linus Galas, DPM;  Location: [facility] ORS;  Service: Podiatry;  Laterality: Left;   CYSTOSCOPY  2015   Biopsy   HERNIA REPAIR Right 2008   [place]   I & D EXTREMITY Left 07/02/2022   Procedure: IRRIGATION AND DEBRIDEMENT PREPATELLAR BURSA LEFT KNEE;  Surgeon: [name], MD;  Location: [facility] OR;  Service: Orthopedics;  Laterality: Left;   REVERSE SHOULDER ARTHROPLASTY Right 09/18/2020   Procedure: RIGHT REVERSE SHOULDER ARTHROPLASTY;  Surgeon: Cammy Copa, MD;  Location: [facility] OR;  Service: Orthopedics;  Laterality: Right;   Social History   Occupational History   Occupation: unemployed  Tobacco Use   Smoking status: Every Day    Packs/day: 0.40    Years: 43.00    Total pack years: 17.20    Types: Cigarettes   Smokeless tobacco: Never   Tobacco comments:    cut down to quarter pack a day, aware of resources  Vaping Use   Vaping Use: Never used  Substance and Sexual Activity   Alcohol use: Not Currently    Comment: rare   Drug use: Not Currently    Frequency: 7.0 times per week    Types: Marijuana    Comment: last use was 06/04/2021   Sexual activity: Yes

## 2022-07-20 ENCOUNTER — Encounter: Payer: Medicaid Other | Admitting: Family

## 2022-07-30 ENCOUNTER — Encounter: Payer: Medicaid Other | Admitting: Family

## 2023-06-23 DIAGNOSIS — T8781 Dehiscence of amputation stump: Secondary | ICD-10-CM

## 2023-07-22 DIAGNOSIS — I214 Non-ST elevation (NSTEMI) myocardial infarction: Secondary | ICD-10-CM | POA: Insufficient documentation

## 2023-08-06 DIAGNOSIS — E1165 Type 2 diabetes mellitus with hyperglycemia: Secondary | ICD-10-CM | POA: Insufficient documentation

## 2023-08-08 DIAGNOSIS — M47812 Spondylosis without myelopathy or radiculopathy, cervical region: Secondary | ICD-10-CM | POA: Insufficient documentation

## 2023-08-08 DIAGNOSIS — Z8249 Family history of ischemic heart disease and other diseases of the circulatory system: Secondary | ICD-10-CM | POA: Insufficient documentation

## 2023-08-08 DIAGNOSIS — I2510 Atherosclerotic heart disease of native coronary artery without angina pectoris: Secondary | ICD-10-CM | POA: Insufficient documentation

## 2023-08-08 DIAGNOSIS — Z87891 Personal history of nicotine dependence: Secondary | ICD-10-CM | POA: Insufficient documentation

## 2023-08-09 DIAGNOSIS — D494 Neoplasm of unspecified behavior of bladder: Secondary | ICD-10-CM | POA: Insufficient documentation

## 2023-08-25 DIAGNOSIS — I6381 Other cerebral infarction due to occlusion or stenosis of small artery: Secondary | ICD-10-CM | POA: Insufficient documentation

## 2023-09-10 DIAGNOSIS — Z5982 Transportation insecurity: Secondary | ICD-10-CM | POA: Insufficient documentation

## 2023-12-01 DIAGNOSIS — Z5986 Financial insecurity: Secondary | ICD-10-CM | POA: Insufficient documentation

## 2023-12-01 DIAGNOSIS — K118 Other diseases of salivary glands: Secondary | ICD-10-CM | POA: Insufficient documentation

## 2024-03-02 ENCOUNTER — Encounter (HOSPITAL_COMMUNITY): Payer: Self-pay | Admitting: *Deleted

## 2024-03-02 ENCOUNTER — Other Ambulatory Visit: Payer: Self-pay

## 2024-03-02 ENCOUNTER — Emergency Department (HOSPITAL_COMMUNITY)

## 2024-03-02 ENCOUNTER — Emergency Department (HOSPITAL_COMMUNITY)
Admission: EM | Admit: 2024-03-02 | Discharge: 2024-03-02 | Discharge status: Against Medical Advice | Attending: Emergency Medicine | Admitting: Emergency Medicine

## 2024-03-02 DIAGNOSIS — Z5329 Procedure and treatment not carried out because of patient's decision for other reasons: Secondary | ICD-10-CM | POA: Insufficient documentation

## 2024-03-02 DIAGNOSIS — M79674 Pain in right toe(s): Secondary | ICD-10-CM | POA: Diagnosis present

## 2024-03-02 DIAGNOSIS — Z79899 Other long term (current) drug therapy: Secondary | ICD-10-CM | POA: Insufficient documentation

## 2024-03-02 DIAGNOSIS — R030 Elevated blood-pressure reading, without diagnosis of hypertension: Secondary | ICD-10-CM

## 2024-03-02 DIAGNOSIS — I10 Essential (primary) hypertension: Secondary | ICD-10-CM | POA: Diagnosis not present

## 2024-03-02 MED ORDER — IBUPROFEN 400 MG PO TABS: 400.0000 mg | ORAL_TABLET | Freq: Once | ORAL | Status: DC

## 2024-03-02 MED ORDER — ACETAMINOPHEN 500 MG PO TABS: 1000.0000 mg | ORAL_TABLET | Freq: Once | ORAL | Status: DC

## 2024-03-02 NOTE — ED Notes (Signed)
 Pt denies injuring toe and unsure how toe nail became raised. Pt toenail is yellow in color and the tip of his toe has a callus or healing wound. Pt has been treating pain with Tylenol and Ibuprofen without relief.

## 2024-03-02 NOTE — ED Notes (Signed)
 NT informed RN pt stated he will f/u with his orthopedic surgeon and left

## 2024-03-02 NOTE — ED Triage Notes (Signed)
 The pt is c/o pain in his rt second toe for one week. He is a diabetic with many complications

## 2024-03-02 NOTE — Discharge Instructions (Signed)
 It was our pleasure to provide your ER care today - we hope that you feel better.  Take acetaminophen or ibuprofen as need.   Follow up closely with primary care doctor and foot specialist in the next 1-2 weeks.   Return to ER if worse, new symptoms, fevers, increased swelling and redness to area, purulent drainage to area, or other concern.

## 2024-03-02 NOTE — ED Notes (Signed)
 Pt left AMA stating he wanted to call his doctor next week instead of waiting here to get treated.

## 2024-03-02 NOTE — ED Provider Triage Note (Signed)
 Emergency Medicine Provider Triage Evaluation Note  Justin Landry , a 63 y.o. male  was evaluated in triage.  Pt complains of right 2nd toe pain in past 1-2 weeks. Denies specific injury. No proximal foot pain. No drainage or redness.   Review of Systems  Positive: Toe pain.  Negative: Fevers/chills.   Physical Exam  BP (!) 184/94   Pulse 73   Temp 97.9 F (36.6 C)   Resp 16   Ht 1.702 m (5\' 7" )   Wt 95.3 kg   SpO2 100%   BMI 32.91 kg/m  Gen:   Awake, no distress   Resp:  Normal effort  MSK:   Moves extremities without difficulty chronic changes nails of toes of feet. Right great toe and 2nd toe w calluses at end of toe. No necrotic, gangrenous or devitalized tissue. No erythema. No crepitus. Toe is tender. Distal pulse palp in foot. Normal cap refill in toes.    Medical Decision Making  Medically screening exam initiated at 4:05 PM.  Appropriate orders placed.  Justin Landry was informed that the remainder of the evaluation will be completed by another provider, this initial triage assessment does not replace that evaluation, and the importance of remaining in the ED until their evaluation is complete.  Imaging ordered.    Cathren Laine, MD 03/02/24 [phone]

## 2024-03-02 NOTE — ED Provider Notes (Signed)
 [place] EMERGENCY DEPARTMENT AT [facility] Provider Note   CSN: [id number] Arrival date & time: 03/02/24  1533     History  Chief Complaint  Patient presents with   Toe Pain    Justin Landry is a 63 y.o. male.  Pt with right 2nd toe pain in the past few weeks. Denies known specific injury. No pain radiating up foot. No foot swelling or redness. Pt otherwise does not feel sick or ill. No fever/chills/sweats. No nv. No drainage from toe. No redness.   The history is provided by the patient and medical records.  Toe Pain       Home Medications Prior to Admission medications   Medication Sig Start Date End Date Taking? Authorizing Provider  acetaminophen (TYLENOL) 500 MG tablet Take 500 mg by mouth every 6 (six) hours as needed (for pain.).    [provider]  budesonide-formoterol (SYMBICORT) 160-4.5 MCG/ACT inhaler Inhale 2 puffs into the lungs 2 (two) times daily as needed (respiratory issues.).    [provider]  cholecalciferol (VITAMIN D) 25 MCG (1000 UNIT) tablet Take 1,000 Units by mouth in the morning.    [provider]  doxycycline (VIBRA-TABS) 100 MG tablet Take 1 tablet (100 mg total) by mouth 2 (two) times daily. 07/05/22   [name], MD  gabapentin (NEURONTIN) 400 MG capsule Take 1 capsule (400 mg total) by mouth 3 (three) times daily. 09/15/21   [name], MD  hydrOXYzine (VISTARIL) 50 MG capsule Take 50 mg by mouth 3 (three) times daily. 07/01/22   [provider]  ibuprofen (ADVIL) 200 MG tablet Take 600 mg by mouth every 6 (six) hours as needed (pain.).    [provider]  losartan (COZAAR) 100 MG tablet Take 100 mg by mouth in the morning. 06/17/22   [provider]  metFORMIN (GLUCOPHAGE) 1000 MG tablet TAKE ONE TABLET BY MOUTH 2 TIMES A DAY WITH A MEAL 07/08/21 07/08/22  Iloabachie, Chioma E, NP  oxyCODONE-acetaminophen (PERCOCET/ROXICET) 5-325 MG tablet Take 1 tablet by mouth every  4 (four) hours as needed. 07/05/22   [name], MD  varenicline (CHANTIX) 0.5 MG tablet Take 0.5 mg by mouth daily as needed for smoking cessation. 06/18/22   [provider]  vitamin C (ASCORBIC ACID) 500 MG tablet Take 500 mg by mouth daily.    [provider]      Allergies    Anacin-3 [acetaminophen], Morphine and codeine, Oxycontin [oxycodone hcl], Penicillins, and Tramadol    Review of Systems   Review of Systems  Constitutional:  Negative for chills and fever.  Musculoskeletal:        Toe pain  Skin:  Negative for wound.  Neurological:  Negative for numbness.    Physical Exam Updated Vital Signs BP (!) 184/94   Pulse 73   Temp 97.9 F (36.6 C)   Resp 16   Ht 1.702 m (5\' 7" )   Wt 95.3 kg   SpO2 100%   BMI 32.91 kg/m  Physical Exam Vitals and nursing note reviewed.  Constitutional:      Appearance: Normal appearance. He is well-developed.  HENT:     Head: Atraumatic.     Nose: Nose normal.  Eyes:     General: No scleral icterus.    Conjunctiva/sclera: Conjunctivae normal.  Neck:     Trachea: No tracheal deviation.  Cardiovascular:     Rate and Rhythm: Normal rate.     Pulses: Normal  pulses.  Pulmonary:     Effort: Pulmonary effort is normal. No accessory muscle usage or respiratory distress.  Musculoskeletal:        General: No swelling.     Comments: Right great toe is mildly tender. No pain w passive rom. No erythema/cellulitis. No necrotic or devitalized or gangrenous tissue. Toe nails with chronic changes/ chronic thickening. Pt w callous to end of great toe and 2nd toe that does not appear acutely infected. Intact cap refill. Foot w distal pulses intact.   Skin:    General: Skin is warm and dry.     Findings: No rash.  Neurological:     Mental Status: He is alert.     Comments: Alert, speech clear. Right foot nvi.   Psychiatric:        Mood and Affect: Mood normal.     ED Results / Procedures / Treatments   Labs (all labs  ordered are listed, but only abnormal results are displayed) Labs Reviewed - No data to display  EKG None  Radiology DG Toe 2nd Right Result Date: 03/02/2024 CLINICAL DATA:  pain EXAM: RIGHT SECOND TOE COMPARISON:  None Available. FINDINGS: Limited evaluation due to overlapping osseous structures and overlying soft tissues. No cortical erosion or destruction. There is no evidence of fracture or dislocation. There is no evidence of severe arthropathy or other focal bone abnormality. Soft tissues are unremarkable. IMPRESSION: No acute displaced fracture or dislocation. Electronically Signed   By: Tish Frederickson M.D.   On: 03/02/2024 17:50    Procedures Procedures    Medications Ordered in ED Medications - No data to display  ED Course/ Medical Decision Making/ A&P                                 Medical Decision Making Problems Addressed: Elevated blood pressure reading: acute illness or injury Essential hypertension: chronic illness or injury with exacerbation, progression, or side effects of treatment that poses a threat to life or bodily functions Toe pain, right: acute illness or injury    Details: Right 2nd toe  Amount and/or Complexity of Data Reviewed External Data Reviewed: notes. Radiology: ordered and independent interpretation performed. Decision-making details documented in ED Course.  Risk OTC drugs. Prescription drug management.   Xrays ordered.   Reviewed nursing notes and prior charts for additional history.   Xrays reviewed/interpreted by me - no fx.   Will refer to f/u pcp/podiatry.   Return precautions provided.           Final Clinical Impression(s) / ED Diagnoses Final diagnoses:  None    Rx / DC Orders ED Discharge Orders     None         Cathren Laine, MD 03/02/24 [phone]

## 2024-03-09 DIAGNOSIS — Z89512 Acquired absence of left leg below knee: Secondary | ICD-10-CM | POA: Insufficient documentation

## 2024-05-22 ENCOUNTER — Encounter: Payer: Self-pay | Admitting: Urology

## 2024-05-22 ENCOUNTER — Ambulatory Visit: Admitting: Urology

## 2024-06-13 ENCOUNTER — Ambulatory Visit: Admitting: Urology

## 2024-06-13 ENCOUNTER — Encounter: Payer: Self-pay | Admitting: Urology

## 2024-07-16 ENCOUNTER — Encounter: Payer: Self-pay | Admitting: Orthopedic Surgery

## 2024-07-16 ENCOUNTER — Other Ambulatory Visit: Payer: Self-pay

## 2024-07-16 ENCOUNTER — Ambulatory Visit (INDEPENDENT_AMBULATORY_CARE_PROVIDER_SITE_OTHER): Admitting: Orthopedic Surgery

## 2024-07-16 DIAGNOSIS — M79605 Pain in left leg: Secondary | ICD-10-CM | POA: Diagnosis not present

## 2024-07-16 DIAGNOSIS — Z89512 Acquired absence of left leg below knee: Secondary | ICD-10-CM

## 2024-07-16 NOTE — Progress Notes (Signed)
 Office Visit Note   Patient: Justin Landry           Date of Birth: [birth year]           MRN: [id number] Visit Date: 07/16/2024              Requested by: Care, [facility] [address] [place],  [place] [address] PCP: Care, [facility]  Chief Complaint  Patient presents with   Left Leg - Follow-up, Pain    Hx left BKA      HPI: Patient is a 63 year old gentleman who is a left transtibial amputation.  Patient has been subsiding into his socket secondary to loss of residual volume.  He is currently wearing 6 socks over 15 ply.  Assessment & Plan: Visit Diagnoses:  1. Pain in left leg   2. Left below-knee amputee [facility])     Plan: Patient is provided a prescription for a new socket liner and supplies.  Follow-Up Instructions: No follow-ups on file.   Ortho Exam  Patient is alert, oriented, no adenopathy, well-dressed, normal affect, normal respiratory effort. Patient is subsiding into his socket.  He has blistering over the inferior pole of the patella and has a fluid-filled collection over the end of the residual limb secondary to subsiding.  There is no cellulitis no drainage.  Patient is an existing left transtibial  amputee.  Patient's current comorbidities are not expected to impact the ability to function with the prescribed prosthesis. Patient verbally communicates a strong desire to use a prosthesis. Patient currently requires mobility aids to ambulate without a prosthesis.  Expects not to use mobility aids with a new prosthesis. Patient is expected to resume or reach their K Level within 6 months. Patient was active before the amputation and independent with stairs, uneven terrain, varying cadence, and a community ambulator.  Patient is a K3 level ambulator that spends a lot of time walking around on uneven terrain over obstacles, up and down stairs, and ambulates with a variable cadence.       Imaging: No results found. No images  are attached to the encounter.  Labs: Lab Results  Component Value Date   HGBA1C 6.7 (H) 07/03/2022   HGBA1C 6.3 (A) 07/08/2021   HGBA1C 7.5 (H) 03/18/2021   ESRSEDRATE 2 07/17/2021   CRP 8.3 (H) 07/17/2021   LABURIC 5.2 06/23/2022   REPTSTATUS 07/07/2022 FINAL 07/02/2022   GRAMSTAIN  07/02/2022    FEW WBC PRESENT,BOTH PMN AND MONONUCLEAR NO ORGANISMS SEEN    CULT  07/02/2022    RARE METHICILLIN RESISTANT STAPHYLOCOCCUS AUREUS NO ANAEROBES ISOLATED Performed at [facility], [address] [place], [place] [address]    LABORGA METHICILLIN RESISTANT STAPHYLOCOCCUS AUREUS 07/02/2022     Lab Results  Component Value Date   ALBUMIN 3.7 06/29/2022   ALBUMIN 4.2 09/10/2020   ALBUMIN 4.5 07/14/2020    No results found for: MG No results found for: VD25OH  No results found for: PREALBUMIN    Latest Ref Rng & Units 06/29/2022    1:05 PM 06/23/2022    3:20 PM 09/23/2021   10:35 PM  CBC EXTENDED  WBC 4.0 - 10.5 K/uL 16.5  19.4  12.5   RBC 4.22 - 5.81 MIL/uL 5.43  5.33  5.24   Hemoglobin 13.0 - 17.0 g/dL 83.3  83.4  83.1   HCT 39.0 - 52.0 % 49.5  48.2  46.5   Platelets 150 - 400 K/uL 478  311  299   NEUT# 1.7 - 7.7 K/uL 12.7  15.1    Lymph# 0.7 - 4.0 K/uL 2.3  2.1       There is no height or weight on file to calculate BMI.  Orders:  Orders Placed This Encounter  Procedures   XR Tibia/Fibula Left   No orders of the defined types were placed in this encounter.    Procedures: No procedures performed  Clinical Data: No additional findings.  ROS:  All other systems negative, except as noted in the HPI. Review of Systems  Objective: Vital Signs: There were no vitals taken for this visit.  Specialty Comments:  No specialty comments available.  PMFS History: Patient Active Problem List   Diagnosis Date Noted   Prepatellar bursitis, left knee 07/02/2022   Septic infrapatellar bursitis of left knee    Type 2 diabetes mellitus with other  specified complication (HCC) 04/21/2021   Acute osteomyelitis of left foot (HCC) 03/18/2021   Cutaneous abscess of left foot    Dehiscence of amputation stump (HCC)    Subacute osteomyelitis of left foot (HCC)    Rupture of operation wound    Subacute osteomyelitis, left ankle and foot (HCC)    Overgrown toenails 10/30/2020   Pre-operative clearance 09/16/2020   Abnormal EKG 09/16/2020   Chronic left shoulder pain 04/10/2020   Toe pain 02/21/2020   Anxiety 02/08/2020   Left knee pain 02/07/2020   Inguinodynia, right 11/13/2019   Elevated lipids 10/04/2019   Diabetic ulcer of toe of left foot associated with diabetes mellitus due to underlying condition (HCC) 08/23/2019   History of right inguinal hernia 08/14/2019   Encounter to establish care 08/14/2019   Anxiety 08/14/2019   Right shoulder pain 08/14/2019   Cellulitis 06/22/2019   Essential hypertension 05/27/2015   Type 2 diabetes mellitus without complication (HCC) 05/27/2015   Diverticulosis of large intestine without hemorrhage 10/16/2014   Recurrent right inguinal hernia 09/27/2014   Tobacco use 08/01/2014   Hematuria, gross 07/03/2014   Lower urinary tract symptoms (LUTS) 07/03/2014   Diabetes mellitus (HCC) 06/25/2014   Inguinal hernia, bilateral  04/10/2014   Past Medical History:  Diagnosis Date   ADHD (attention deficit hyperactivity disorder)    Arthritis    right shoulder   Depression    Diabetes mellitus    Type II   Headache    migraines   High cholesterol    Humerus fracture    right   Hypertension    Inguinal hernia    Neuromuscular disorder (HCC)    neuropathy    Family History  Problem Relation Age of Onset   Diabetes Mother    Hypertension Mother    Diabetes Father    Hypertension Father    Cancer Brother        brain    Past Surgical History:  Procedure Laterality Date   AMPUTATION Left 12/10/2020   Procedure: LEFT GREAT TOE AMPUTATION;  Surgeon: Harden Jerona GAILS, MD;  Location: [facility] OR;   Service: Orthopedics;  Laterality: Left;   AMPUTATION Left 01/21/2021   Procedure: 1ST AND 2ND RAY AMPUTATION LEFT FOOT;  Surgeon: Harden Jerona GAILS, MD;  Location: [facility] OR;  Service: Orthopedics;  Laterality: Left;   AMPUTATION Left 03/18/2021   Procedure: LEFT BELOW KNEE AMPUTATION;  Surgeon: Harden Jerona GAILS, MD;  Location: [facility] OR;  Service: Orthopedics;  Laterality: Left;   AMPUTATION TOE Left 06/23/2019   Procedure: AMPUTATION TOE LEFT AMPUTATION;  Surgeon: Neill Boas, DPM;  Location:  [facility] ORS;  Service: Podiatry;  Laterality: Left;   AMPUTATION TOE Left 08/31/2019   Procedure: AMPUTATION TOE  IPJ [id number];  Surgeon: Neill Boas, DPM;  Location: [facility] ORS;  Service: Podiatry;  Laterality: Left;   CYSTOSCOPY  2015   Biopsy   HERNIA REPAIR Right 2008      I & D EXTREMITY Left 07/02/2022   Procedure: IRRIGATION AND DEBRIDEMENT PREPATELLAR BURSA LEFT KNEE;  Surgeon: Harden Jerona GAILS, MD;  Location: [facility] OR;  Service: Orthopedics;  Laterality: Left;   REVERSE SHOULDER ARTHROPLASTY Right 09/18/2020   Procedure: RIGHT REVERSE SHOULDER ARTHROPLASTY;  Surgeon: Addie Cordella Hamilton, MD;  Location: [facility] OR;  Service: Orthopedics;  Laterality: Right;   Social History   Occupational History   Occupation: unemployed  Tobacco Use   Smoking status: Every Day    Current packs/day: 0.40    Average packs/day: 0.4 packs/day for 43.0 years (17.2 ttl pk-yrs)    Types: Cigarettes   Smokeless tobacco: Never   Tobacco comments:    cut down to quarter pack a day, aware of resources  Vaping Use   Vaping status: Never Used  Substance and Sexual Activity   Alcohol use: Not Currently    Comment: rare   Drug use: Not Currently    Frequency: 7.0 times per week    Types: Marijuana    Comment: last use was 06/04/2021   Sexual activity: Yes

## 2024-07-24 ENCOUNTER — Ambulatory Visit: Admitting: Urology

## 2024-07-29 DIAGNOSIS — Z789 Other specified health status: Secondary | ICD-10-CM | POA: Insufficient documentation

## 2024-07-29 DIAGNOSIS — M899 Disorder of bone, unspecified: Secondary | ICD-10-CM | POA: Insufficient documentation

## 2024-07-29 DIAGNOSIS — Z79899 Other long term (current) drug therapy: Secondary | ICD-10-CM | POA: Insufficient documentation

## 2024-07-29 DIAGNOSIS — G894 Chronic pain syndrome: Secondary | ICD-10-CM | POA: Insufficient documentation

## 2024-07-29 NOTE — Progress Notes (Unsigned)
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   Right shoulder pain 08/14/2019   Diabetic ulcer of toe of left foot associated with diabetes mellitus due to underlying condition (HCC) 08/23/2019   Elevated lipids 10/04/2019   Inguinodynia, right 11/13/2019   Left knee pain 02/07/2020   Anxiety 02/08/2020   Toe pain 02/21/2020   Chronic left shoulder pain 04/10/2020   Diabetes mellitus (HCC) 06/25/2014   Diverticulosis of large intestine without hemorrhage 10/16/2014   Hematuria, gross 07/03/2014   Lower urinary tract symptoms (LUTS) 07/03/2014   Recurrent right inguinal hernia 09/27/2014   Tobacco use 08/01/2014   Pre-operative clearance 09/16/2020   Abnormal EKG 09/16/2020   Overgrown toenails 10/30/2020   Subacute osteomyelitis, left ankle and foot (HCC)     Subacute osteomyelitis of left foot (HCC)    Rupture of operation wound    Cutaneous abscess of left foot    Dehiscence of amputation stump (HCC) 06/23/2023   Acute osteomyelitis of left foot (HCC) 03/18/2021   Type 2 diabetes mellitus with other specified complication (HCC) 04/21/2021   Septic infrapatellar bursitis of left knee    Prepatellar bursitis, left knee 07/02/2022   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   Bladder tumor 08/09/2023   Cervical spondylosis 08/08/2023   Coronary artery disease 08/08/2023   Diverticulosis of colon 10/16/2014   Family history of cardiac disorder 08/08/2023   Financial insecurity 12/01/2023   History of osteomyelitis 03/18/2021   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   NSTEMI (non-ST elevated myocardial infarction) (HCC) 07/22/2023   Personal history of nicotine  dependence 08/08/2023   Right-sided lacunar infarction (HCC) 08/25/2023   Transportation insecurity 09/10/2023   Type 2 diabetes mellitus with hyperglycemia (HCC) 08/06/2023   History of diabetic ulcer of foot 08/23/2019   Chronic pain syndrome 07/29/2024   Pharmacologic therapy 07/29/2024   Disorder of skeletal system 07/29/2024   Problems influencing health status 07/29/2024   Resolved Ambulatory Problems    Diagnosis Date Noted   No Resolved Ambulatory Problems   Past Medical History:  Diagnosis Date   ADHD (attention deficit hyperactivity disorder)    Arthritis    Depression    Diabetes mellitus    Headache    High cholesterol    Humerus fracture    Hypertension    Neuromuscular disorder (HCC)    Constitutional Exam  General appearance: Well nourished, well developed, and well hydrated. In no apparent acute distress There were no vitals filed for this visit. BMI Assessment: Estimated body mass index is 32.91 kg/m  as calculated from the following:   Height as of 03/02/24: 5' 7 (1.702 m).   Weight as of 03/02/24: 210 lb 1.6 oz (95.3 kg).  BMI  interpretation table: BMI level Category Range association with higher incidence of chronic pain  <18 kg/m2 Underweight   18.5-24.9 kg/m2 Ideal body weight   25-29.9 kg/m2 Overweight Increased incidence by 20%  30-34.9 kg/m2 Obese (Class I) Increased incidence by 68%  35-39.9 kg/m2 Severe obesity (Class II) Increased incidence by 136%  >40 kg/m2 Extreme obesity (Class III) Increased incidence by 254%   Patient's current BMI Ideal Body weight  There is no height or weight on file to calculate BMI. Patient weight not recorded   BMI Readings from Last 4 Encounters:  03/02/24 32.91 kg/m  07/02/22 32.91 kg/m  06/23/22 32.91 kg/m  09/25/21 32.89 kg/m   Wt Readings from Last 4 Encounters:  03/02/24 210 lb 1.6 oz (95.3 kg)  07/02/22 210 lb 1.6 oz (95.3 kg)  06/23/22 210 lb 1.6 oz (95.3 kg)  09/25/21 210 lb (95.3 kg)    Psych/Mental status: Alert, oriented x 3 (person, place, & time)       Eyes: PERLA Respiratory: No evidence of acute respiratory distress  Assessment  Primary Diagnosis & Pertinent Problem List: The primary encounter diagnosis was Chronic pain syndrome. Diagnoses of Pharmacologic therapy, Disorder of skeletal system, and Problems influencing health status were also pertinent to this visit.  Visit Diagnosis (New problems to examiner): 1. Chronic pain syndrome   2. Pharmacologic therapy   3. Disorder of skeletal system   4. Problems influencing health status    Plan of Care (Initial workup plan)  Note: Mr. Allegretto was reminded that as per protocol, today's visit has been an evaluation only. We have not taken over the patient's controlled substance management.  Problem-specific plan: Assessment and Plan            Lab Orders  No laboratory test(s) ordered today   Imaging Orders  No imaging studies ordered today   Referral Orders  No referral(s) requested today   Procedure Orders    No procedure(s) ordered today   Pharmacotherapy  (current): Medications ordered:  No orders of the defined types were placed in this encounter.  Medications administered during this visit: Brett Darko. Zukas had no medications administered during this visit.   Analgesic Pharmacotherapy:  Opioid Analgesics: For patients currently taking or requesting to take opioid analgesics, in accordance with [place]  Medical Board Guidelines, we will assess their risks and indications for the use of these substances. After completing our evaluation, we may offer recommendations, but we no longer take patients for medication management. The prescribing physician will ultimately decide, based on his/her training and level of comfort whether to adopt any of the recommendations, including whether or not to prescribe such medicines.  Membrane stabilizer: To be determined at a later time  Muscle relaxant: To be determined at a later time  NSAID: To be determined at a later time  Other analgesic(s): To be determined at a later time   Interventional management options: Mr. Overley was informed that there is no guarantee that he would be a candidate for interventional therapies. The decision will be based on the results of diagnostic studies, as well as Mr. Ayala risk profile.  Procedure(s) under consideration:  Pending results of ordered studies     Interventional Therapies  Risk Factors  Considerations  Medical Comorbidities:     Planned  Pending:  Under consideration:   Pending   Completed: (Analgesic benefit)1  None at this time   Therapeutic  Palliative (PRN) options:   None established   Completed by other providers:   None reported  1(Analgesic benefit): Expressed in percentage (%). (Local anesthetic[LA] +/- sedation  L.A.Local Anesthetic  Steroid benefit  Ongoing benefit)   Provider-requested follow-up: No follow-ups on file.  Future Appointments  Date Time Provider Department Center  07/30/2024  9:00 AM  Tanya Glisson, MD [facility] None  10/16/2024  1:00 PM Harden Jerona GAILS, MD [facility] None   I discussed the assessment and treatment plan with the patient. The patient was provided an opportunity to ask questions and all were answered. The patient agreed with the plan and demonstrated an understanding of the instructions.  Patient advised to call back or seek an in-person evaluation if the symptoms or condition worsens.  Duration of encounter: *** minutes.  Total time on encounter, as per AMA guidelines included both the face-to-face and non-face-to-face time personally spent by the physician and/or other qualified health care professional(s) on the day of the encounter (includes time in activities that require the physician or other qualified health care professional and does not include time in activities normally performed by clinical staff). Physician's time may include the following activities when performed: Preparing to see the patient (e.g., pre-charting review of records, searching for previously ordered imaging, lab work, and nerve conduction tests) Review of prior analgesic pharmacotherapies. Reviewing PMP Interpreting ordered tests (e.g., lab work, imaging, nerve conduction tests) Performing post-procedure evaluations, including interpretation of diagnostic procedures Obtaining and/or reviewing separately obtained history Performing a medically appropriate examination and/or evaluation Counseling and educating the patient/family/caregiver Ordering medications, tests, or procedures Referring and communicating with other health care professionals (when not separately reported) Documenting clinical information in the electronic or other health record Independently interpreting results (not separately reported) and communicating results to the patient/ family/caregiver Care coordination (not separately reported)  Note by: Glisson DELENA Tanya, MD (TTS and AI technology used. I apologize  for any typographical errors that were not detected and corrected.) Date: 07/30/2024; Time: 6:45 PM

## 2024-07-29 NOTE — Patient Instructions (Incomplete)

## 2024-07-30 ENCOUNTER — Ambulatory Visit: Attending: Pain Medicine | Admitting: Pain Medicine

## 2024-07-30 VITALS — BP 170/98 | HR 62 | Temp 97.4°F | Resp 17 | Ht 67.0 in | Wt 180.0 lb

## 2024-07-30 DIAGNOSIS — Z789 Other specified health status: Secondary | ICD-10-CM | POA: Insufficient documentation

## 2024-07-30 DIAGNOSIS — M899 Disorder of bone, unspecified: Secondary | ICD-10-CM | POA: Diagnosis present

## 2024-07-30 DIAGNOSIS — Z9189 Other specified personal risk factors, not elsewhere classified: Secondary | ICD-10-CM | POA: Insufficient documentation

## 2024-07-30 DIAGNOSIS — Z8739 Personal history of other diseases of the musculoskeletal system and connective tissue: Secondary | ICD-10-CM | POA: Insufficient documentation

## 2024-07-30 DIAGNOSIS — E118 Type 2 diabetes mellitus with unspecified complications: Secondary | ICD-10-CM | POA: Insufficient documentation

## 2024-07-30 DIAGNOSIS — M79605 Pain in left leg: Secondary | ICD-10-CM | POA: Insufficient documentation

## 2024-07-30 DIAGNOSIS — B351 Tinea unguium: Secondary | ICD-10-CM | POA: Insufficient documentation

## 2024-07-30 DIAGNOSIS — T8789 Other complications of amputation stump: Secondary | ICD-10-CM | POA: Diagnosis not present

## 2024-07-30 DIAGNOSIS — Z79899 Other long term (current) drug therapy: Secondary | ICD-10-CM | POA: Insufficient documentation

## 2024-07-30 DIAGNOSIS — G894 Chronic pain syndrome: Secondary | ICD-10-CM | POA: Insufficient documentation

## 2024-07-30 NOTE — Progress Notes (Signed)
 Safety precautions to be maintained throughout the outpatient stay will include: orient to surroundings, keep bed in low position, maintain call bell within reach at all times, provide assistance with transfer out of bed and ambulation.

## 2024-07-31 LAB — SEDIMENTATION RATE: Sed Rate: 18 mm/h (ref 0–30)

## 2024-08-06 LAB — CBC WITH DIFFERENTIAL/PLATELET
Basophils Absolute: 0.1 x10E3/uL (ref 0.0–0.2)
Basos: 1 %
EOS (ABSOLUTE): 0.3 x10E3/uL (ref 0.0–0.4)
Eos: 3 %
Hematocrit: 49.5 % (ref 37.5–51.0)
Hemoglobin: 16.5 g/dL (ref 13.0–17.7)
Immature Grans (Abs): 0 x10E3/uL (ref 0.0–0.1)
Immature Granulocytes: 0 %
Lymphocytes Absolute: 2 x10E3/uL (ref 0.7–3.1)
Lymphs: 20 %
MCH: 32.4 pg (ref 26.6–33.0)
MCHC: 33.3 g/dL (ref 31.5–35.7)
MCV: 97 fL (ref 79–97)
Monocytes Absolute: 0.8 x10E3/uL (ref 0.1–0.9)
Monocytes: 8 %
Neutrophils Absolute: 6.6 x10E3/uL (ref 1.4–7.0)
Neutrophils: 68 %
Platelets: 250 x10E3/uL (ref 150–450)
RBC: 5.1 x10E6/uL (ref 4.14–5.80)
RDW: 12.9 % (ref 11.6–15.4)
WBC: 9.8 x10E3/uL (ref 3.4–10.8)

## 2024-08-06 LAB — 25-HYDROXY VITAMIN D LCMS D2+D3
25-Hydroxy, Vitamin D-2: <1 ng/mL
25-Hydroxy, Vitamin D-3: 35 ng/mL
25-Hydroxy, Vitamin D: 35 ng/mL

## 2024-08-06 LAB — C-REACTIVE PROTEIN: CRP: 6 mg/L (ref 0–10)

## 2024-08-15 ENCOUNTER — Other Ambulatory Visit: Payer: Self-pay | Admitting: Otolaryngology

## 2024-08-15 DIAGNOSIS — K118 Other diseases of salivary glands: Secondary | ICD-10-CM

## 2024-08-21 NOTE — Progress Notes (Unsigned)
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   Therapeutic  Palliative (PRN) options:   None established   Completed by other providers:   None reported  1(Analgesic benefit): Expressed in percentage (%). (Local anesthetic +/- sedation  L.A.Local Anesthetic  Steroid benefit  Ongoing benefit)      Provider-requested follow-up: No follow-ups on file. Recent Visits Date Type Provider Dept  07/30/24 Office Visit Tanya Glisson, MD [facility]  Showing recent visits within past 90 days and meeting all other requirements Future Appointments Date Type Provider Dept  08/22/24 Appointment Tanya Glisson, MD [facility]  Showing future appointments within next 90 days and meeting  all other requirements   Primary Care Physician: Care, [facility]  Duration of encounter: *** minutes.  Total time on encounter, as per AMA guidelines included both the face-to-face and non-face-to-face time personally spent by the physician and/or other qualified health care professional(s) on the day of the encounter (includes time in activities that require the physician or other qualified health care professional and does not include time in activities normally performed by clinical staff). Physician's time may include the following activities when performed: Preparing to see the patient (e.g., pre-charting review of records, searching for previously ordered imaging, lab work, and nerve conduction tests) Review of prior analgesic pharmacotherapies. Reviewing PMP Interpreting ordered tests (e.g.,  lab work, imaging, nerve conduction tests) Performing post-procedure evaluations, including interpretation of diagnostic procedures Obtaining and/or reviewing separately obtained history Performing a medically appropriate examination and/or evaluation Counseling and educating the patient/family/caregiver Ordering medications, tests, or procedures Referring and communicating with other health care professionals (when not separately reported) Documenting clinical information in the electronic or other health record Independently interpreting results (not separately reported) and communicating results to the patient/ family/caregiver Care coordination (not separately reported)  Note by: Eric DELENA Como, MD (TTS technology used. I apologize for any typographical errors that were not detected and corrected.) Date: 08/22/2024; Time: 7:09 AM

## 2024-08-22 ENCOUNTER — Ambulatory Visit (HOSPITAL_BASED_OUTPATIENT_CLINIC_OR_DEPARTMENT_OTHER): Admitting: Pain Medicine

## 2024-08-22 DIAGNOSIS — Z91199 Patient's noncompliance with other medical treatment and regimen due to unspecified reason: Secondary | ICD-10-CM

## 2024-08-22 DIAGNOSIS — G8929 Other chronic pain: Secondary | ICD-10-CM

## 2024-08-22 DIAGNOSIS — Z89512 Acquired absence of left leg below knee: Secondary | ICD-10-CM

## 2024-08-22 DIAGNOSIS — M79605 Pain in left leg: Secondary | ICD-10-CM

## 2024-08-22 NOTE — Patient Instructions (Signed)

## 2024-08-23 ENCOUNTER — Ambulatory Visit: Admitting: Urology

## 2024-08-27 ENCOUNTER — Ambulatory Visit (INDEPENDENT_AMBULATORY_CARE_PROVIDER_SITE_OTHER): Admitting: Podiatry

## 2024-08-27 DIAGNOSIS — I739 Peripheral vascular disease, unspecified: Secondary | ICD-10-CM

## 2024-08-27 DIAGNOSIS — B351 Tinea unguium: Secondary | ICD-10-CM

## 2024-08-28 DIAGNOSIS — F4321 Adjustment disorder with depressed mood: Secondary | ICD-10-CM | POA: Insufficient documentation

## 2024-08-30 NOTE — Progress Notes (Signed)
  Subjective:  Patient ID: Justin Landry, male    DOB: 07/13/1961,  MRN: [id number]  Chief Complaint  Patient presents with   Toe Pain    Right foot. Onychomycosis of right great toe/ Tanya Glisson, MD - At risk for diabetic foot ulcer      63 y.o. male presents with the above complaint. History confirmed with patient.   Objective:  Physical Exam: warm, good capillary refill, no trophic changes or ulcerative lesions, weakly palpable DP and PT pulses, normal sensory exam, and mycotic dystrophic right great toenail.  Assessment:   1. Onychomycosis   2. PAD (peripheral artery disease) (HCC)      Plan:  Patient was evaluated and treated and all questions answered.  We discussed treatment options.  I debrided the nail to alleviate pain and pressure.  We discussed the option of permanent total matricectomy and removal of the nail.  He does have some evidence of PAD and risk factors for this.  I recommended noninvasive vascular testing and this is been ordered.  Return in 2-1/2 months for patient after this.  Return in about 10 weeks (around 11/05/2024) for f/u circulation testing and nail issues R foot.

## 2024-09-05 ENCOUNTER — Telehealth: Payer: Self-pay | Admitting: Orthopedic Surgery

## 2024-09-05 NOTE — Telephone Encounter (Signed)
 Patient called. 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

## 2024-09-06 NOTE — Telephone Encounter (Signed)
 I received the following email from [place]   I spoke with Dawn from the [place] office regarding Justin Landry's care being that he's now receiving care at this location.  She confirmed that he was seen on Tuesday for the casting of his new prosthetic. A fitting appointment is scheduled for Oct. 7th and all his appointments are scheduled until he gets it. Dawn informed the patient that due to insurance guidelines, he's not eligible until November to get his supplies, but that's it. Looks like no further action is required on your part.

## 2024-09-06 NOTE — Telephone Encounter (Signed)
 Message sent to [facility] to see if there is anything on our end that we need to do for this pt. Will hold pending response.

## 2024-09-17 DIAGNOSIS — L729 Follicular cyst of the skin and subcutaneous tissue, unspecified: Secondary | ICD-10-CM | POA: Insufficient documentation

## 2024-10-16 ENCOUNTER — Ambulatory Visit: Admitting: Orthopedic Surgery

## 2024-10-22 NOTE — Progress Notes (Unsigned)
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 vitals filed for this visit. BMI Assessment: Estimated body mass index is 28.19 kg/m as calculated from the following:   Height as of 07/30/24: 5' 7 (1.702 m).   Weight as of 07/30/24: 180 lb (81.6 kg).  BMI interpretation table: BMI level Category Range association with higher incidence of chronic pain  <18 kg/m2 Underweight   18.5-24.9 kg/m2 Ideal body weight   25-29.9 kg/m2 Overweight Increased incidence by 20%  30-34.9 kg/m2 Obese (Class I) Increased incidence by 68%  35-39.9 kg/m2 Severe obesity (Class II) Increased incidence by 136%  >40 kg/m2 Extreme obesity (Class III) Increased incidence by 254%   Patient's current BMI Ideal Body weight   There is no height or weight on file to calculate BMI. Patient weight not recorded   BMI Readings from Last 4 Encounters:  07/30/24 28.19 kg/m  03/02/24 32.91 kg/m  07/02/22 32.91 kg/m  06/23/22 32.91 kg/m   Wt Readings from Last 4 Encounters:  07/30/24 180 lb (81.6 kg)  03/02/24 210 lb 1.6 oz (95.3 kg)  07/02/22 210 lb 1.6 oz (95.3 kg)  06/23/22 210 lb 1.6 oz (95.3 kg)    Psych/Mental status: Alert, oriented x 3 (person, place, & time)       Eyes: PERLA  Respiratory: No evidence of acute respiratory distress  Assessment & Plan  Primary Diagnosis & Pertinent Problem List: There were no encounter diagnoses. Visit Diagnosis: No diagnosis found. Problems updated and reviewed during this visit: No problems updated.  Plan of Care  Assessment and Plan             Pharmacotherapy (Medications Ordered): No orders of the defined types were placed in this encounter.  Procedure Orders    No procedure(s) ordered today   No orders of the defined types were placed in this encounter.  Lab Orders  No laboratory test(s) ordered today   Imaging Orders  No imaging studies ordered today   Referral Orders  No referral(s) requested today    Pharmacological management:  Opioid Analgesics: I will not be prescribing any opioids at this time Membrane stabilizer: I will not be prescribing any at this time Muscle relaxant: I will not be prescribing any at this time NSAID: I will not be prescribing any at this time Other analgesic(s): I will not be prescribing any at this time      Interventional Therapies  Risk Factors  Considerations  Medical Comorbidities:  T2NIDDM  CAD w/ Hx. NSTEMI  Smoker  tobacco use  HTN  Hx. (R) lacunar infarct  AnxietyDepressiojn  Hx. Osteomyelitis     Planned  Pending:      Under consideration:   Pending   Completed: (Analgesic benefit)1  None at this time   Therapeutic  Palliative (PRN) options:   None established    Completed by other providers:   None reported  1(Analgesic benefit): Expressed in percentage (%). (Local anesthetic +/- sedation  L.A.Local Anesthetic  Steroid benefit  Ongoing benefit)      Provider-requested follow-up: No follow-ups on file. Recent Visits Date Type Provider Dept  07/30/24 Office Visit Tanya Glisson, MD [facility]  Showing recent visits within past 90 days and meeting all other requirements Future Appointments Date Type Provider Dept  10/24/24 Appointment Tanya Glisson, MD [facility]  Showing future appointments within next 90 days and meeting all other requirements   Primary Care Physician: Care, [facility]  Duration of encounter: *** minutes.  Total time on encounter, as per AMA guidelines included both the face-to-face and non-face-to-face time personally spent by the physician and/or other qualified health care professional(s) on the day of the encounter (includes time in activities that require the physician or other qualified health care professional and does not include time in activities normally performed by clinical staff). Physician's time may include the following activities when performed: Preparing to see the patient (e.g., pre-charting review of records, searching for previously ordered imaging, lab work, and nerve conduction tests) Review of prior analgesic pharmacotherapies. Reviewing PMP Interpreting ordered tests (e.g., lab work, imaging, nerve conduction tests) Performing post-procedure evaluations, including interpretation of diagnostic procedures Obtaining and/or reviewing separately obtained history Performing a medically appropriate examination and/or evaluation Counseling and educating the patient/family/caregiver Ordering medications, tests, or procedures Referring and communicating with other health care professionals (when not separately reported) Documenting clinical information in the  electronic or other health record Independently interpreting results (not separately reported) and communicating results to the patient/ family/caregiver Care coordination (not separately reported)  Note by: Glisson DELENA Tanya, MD (TTS technology used. I apologize for any typographical errors that were not detected and corrected.) Date: 10/24/2024; Time: 6:34 AM

## 2024-10-24 ENCOUNTER — Ambulatory Visit (HOSPITAL_BASED_OUTPATIENT_CLINIC_OR_DEPARTMENT_OTHER): Admitting: Pain Medicine

## 2024-10-24 DIAGNOSIS — Z89512 Acquired absence of left leg below knee: Secondary | ICD-10-CM | POA: Insufficient documentation

## 2024-10-24 DIAGNOSIS — Z91199 Patient's noncompliance with other medical treatment and regimen due to unspecified reason: Secondary | ICD-10-CM

## 2024-10-24 DIAGNOSIS — R9389 Abnormal findings on diagnostic imaging of other specified body structures: Secondary | ICD-10-CM | POA: Insufficient documentation

## 2024-10-24 DIAGNOSIS — Z01818 Encounter for other preprocedural examination: Secondary | ICD-10-CM | POA: Insufficient documentation

## 2024-10-24 DIAGNOSIS — R937 Abnormal findings on diagnostic imaging of other parts of musculoskeletal system: Secondary | ICD-10-CM | POA: Insufficient documentation

## 2024-10-24 DIAGNOSIS — M79609 Pain in unspecified limb: Secondary | ICD-10-CM | POA: Insufficient documentation

## 2024-10-24 DIAGNOSIS — G8929 Other chronic pain: Secondary | ICD-10-CM

## 2024-10-24 NOTE — Patient Instructions (Signed)

## 2024-11-05 ENCOUNTER — Ambulatory Visit: Admitting: Podiatry
